# Patient Record
Sex: Male | Born: 1945 | Race: White | Hispanic: No | Marital: Married | Smoking: Former smoker
Health system: Southern US, Community
[De-identification: ages and names within clinical notes are randomized; demographics above are authoritative.]

## PROBLEM LIST (undated history)

## (undated) ENCOUNTER — Inpatient Hospital Stay: Admission: EM | Payer: Self-pay | Source: Other Acute Inpatient Hospital | Admitting: Internal Medicine

## (undated) DIAGNOSIS — G8929 Other chronic pain: Secondary | ICD-10-CM

## (undated) DIAGNOSIS — M1990 Unspecified osteoarthritis, unspecified site: Secondary | ICD-10-CM

## (undated) DIAGNOSIS — I213 ST elevation (STEMI) myocardial infarction of unspecified site: Secondary | ICD-10-CM

## (undated) DIAGNOSIS — I10 Essential (primary) hypertension: Secondary | ICD-10-CM

## (undated) DIAGNOSIS — E119 Type 2 diabetes mellitus without complications: Secondary | ICD-10-CM

## (undated) DIAGNOSIS — Z720 Tobacco use: Secondary | ICD-10-CM

## (undated) DIAGNOSIS — I2510 Atherosclerotic heart disease of native coronary artery without angina pectoris: Secondary | ICD-10-CM

## (undated) DIAGNOSIS — E785 Hyperlipidemia, unspecified: Secondary | ICD-10-CM

---

## 2005-05-30 ENCOUNTER — Inpatient Hospital Stay (HOSPITAL_COMMUNITY): Admission: AD | Admit: 2005-05-30 | Discharge: 2005-06-02 | Payer: Self-pay | Admitting: Cardiology

## 2005-05-30 ENCOUNTER — Ambulatory Visit: Payer: Self-pay | Admitting: Cardiology

## 2005-06-29 ENCOUNTER — Ambulatory Visit: Payer: Self-pay | Admitting: Cardiology

## 2006-08-01 ENCOUNTER — Ambulatory Visit: Payer: Self-pay | Admitting: Cardiology

## 2007-05-09 ENCOUNTER — Ambulatory Visit: Payer: Self-pay | Admitting: Cardiovascular Disease

## 2007-06-06 ENCOUNTER — Ambulatory Visit: Payer: Self-pay | Admitting: Cardiovascular Disease

## 2007-06-06 ENCOUNTER — Encounter (HOSPITAL_COMMUNITY): Admission: RE | Admit: 2007-06-06 | Discharge: 2007-06-19 | Payer: Self-pay | Admitting: Cardiovascular Disease

## 2007-09-26 ENCOUNTER — Ambulatory Visit: Payer: Self-pay | Admitting: Cardiology

## 2009-04-07 DIAGNOSIS — E118 Type 2 diabetes mellitus with unspecified complications: Secondary | ICD-10-CM

## 2009-04-07 DIAGNOSIS — Z794 Long term (current) use of insulin: Secondary | ICD-10-CM
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
## 2011-02-04 NOTE — Cardiovascular Report (Signed)
NAME:  Dennis [name], Dennis [name]               ACCOUNT NO.:  [id number]   MEDICAL RECORD NO.:  [id number]          PATIENT TYPE:  INP   LOCATION:  2930                         FACILITY:  [facility]   PHYSICIAN:  Salvadore Farber, M.D. LHCDATE OF BIRTH:  09-27-1945   DATE OF PROCEDURE:  05/31/2005  DATE OF DISCHARGE:                              CARDIAC CATHETERIZATION   PROCEDURE:  Left heart catheterization, left ventriculography, coronary  angiography, bare metal stent to the mid RCA, bare metal stent to the distal  RCA, bare metal stent to the second obtuse marginal, intravascular  ultrasound of the RCA.   INDICATIONS FOR PROCEDURE:  [name] is a 65 year old gentleman without  prior cardiac history.  He has risk factors of former tobacco abuse and  diabetes mellitus.  Lipid status is unknown.  He presents with non-ST  segment elevation myocardial infarction.  Electrocardiogram was unrevealing.  He was referred for urgent angiography.  He had had chest pain at rest  shortly prior to coming to the catheterization lab and eptifibatide had been  administered.  He did arrive in the lab pain-free.   DESCRIPTION OF PROCEDURE:  Informed consent was obtained.  Under 1%  lidocaine local anesthesia, a 6 French sheath was placed in the right common  femoral artery using the modified Seldinger technique.  Diagnostic  angiography and ventriculography were performed using JL-4, JR-4 and pigtail  catheters.  This demonstrated the culprit lesion to be a thrombotic severe  stenosis of the mid RCA with severe stenoses also of the distal RCA and  second obtuse marginal.  Decision was made to proceed to percutaneous  revascularization.   Additional bolus of eptifibatide was administered.  The patient had been  given 600 mg of Plavix more than six hours prior to the procedure.  He had  received low molecular weight heparin 12 hours prior to the procedure.  Therefore, 50 units/kg of unfractionated heparin was  administered.  ACT was  maintained at greater than 200 seconds throughout the case.   Attention was first turned to the RCA.  A 6 [place] JR-4 guide was advanced  over a wire and engaged in the ostium of the RCA.  A Prowater wire was  advanced to the distal PDA without difficulty. The lesion of the mid RCA was  predilated using a 2.5 x15 mm Maverick at 6 atmospheres.  The lesion was  then stented using a 3.0 x 20 mm Liberte' at 16 atmospheres.  This was then  post dilated using a 3.25 x 18 mm PowerSail at 18 atmospheres.  Repeat  angiography after the administration of intracoronary nitroglycerin  demonstrated persistence of the severe stenosis in the distal RCA.  Decision  was made to treat this percutaneously.  A 2.75 x 24 mm Liberte' was advanced  across the lesion and deployed at 14 atmospheres.  Next, there remained a  hazy lesion consistent with thrombus at the proximal margin of the stent in  the mid RCA.  To assess whether this presented thrombus extruding through  stent struts or also thrombus extending proximal to the stent versus  dissection,  I proceeded to intravascular ultrasound.  Automated pullback was  performed using the Atlantis ultrasound probe.  This confirmed that there  was thrombus extending from just proximal to the stent into the stented  segment.  To stabilize this, I decided to cover it with a second stent.  Therefore, a 3.0 x 8 mm Liberte' was deployed at 16 atmospheres so as to  overlap the proximal portion of the previously placed stent by approximately  2 mm.  The entirety of this stent including the region of overlap with the  previously placed stent was post dilated using a 3.25 x 18 mm PowerSail at  18 atmospheres.  Final angiography demonstrated no residual stenosis in the  treated regions, no dissection and TIMI-3 flow to the distal vasculature.  There remained no thrombus protruding through the stent.   During treatment of the RCA, the patient did  complain of chest pain as  severe as 10/10.  He had transient ST elevations.  These were most marked  with contrast injections but did persist after this.  Despite this, there  was TIMI-3 flow throughout the procedure and careful assessment of the  angiograms demonstrated no distal embolization.   Attention was then turned to the circumflex.  A VODA left 3.5 guide was  advanced over a wire and advanced in the ostium of the left main.  A  Prowater wire was advanced to the distal obtuse marginal without difficulty.  The lesion was directly stented using a 2.5 x 24 mm Express deployed at 14  atmospheres.  Final angiography demonstrated no residual stenosis and TIMI-3  flow to the distal vasculature.  Patient tolerated the procedure well and  was then transferred to the holding room in stable condition.   COMPLICATIONS:  None.   FINDINGS:  1.  LV:  118/14/24.  EF 65% without regional wall motion abnormality.  2.  No aortic stenosis or mitral regurgitation.  3.  Left main:  Angiographically normal.  4.  LAD:  Moderate size vessel giving rise to a single moderate size      diagonal.  The proximal LAD is diffusely diseased with up to 40%      stenosis.  The diagonal had an 80% ostial stenosis.  5.  Circumflex:  Moderate size vessel giving rise to two obtuse marginals.      The first marginal has a 60% stenosis beginning at its ostium.  The      second obtuse marginal had a long 80% stenosis.  This was treated with      bare metal stent with excellent result.  6.  RCA:  Large dominant vessel.  There is 30% stenosis proximally.  There      was then a thrombotic 95% stenosis of the mid vessel and a 70% stenosis      of the distal vessel before the takeoff of the PDA.  Both the mid and      distal lesions were stented using bare metal stents with excellent      result.   IMPRESSION/RECOMMENDATIONS:  Successful percutaneous intervention on both the right coronary artery and circumflex.  Will  plan on continuing his  Integrilin for 18 hours.  Aspirin should be continued indefinitely.  Plavix  should be continued for a year given his acute coronary syndrome.  Statin  will be initiated.      Salvadore Farber, M.D. [facility]  Electronically Signed     WED/MEDQ  D:  05/31/2005  T:  05/31/2005  Job:  [id number]   cc:   Dr. Darcus [place], [place]   Learta Codding, M.D. [facility]  [address]  [address]  [place]  [place] [address]
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
## 2012-08-26 DIAGNOSIS — R0602 Shortness of breath: Secondary | ICD-10-CM

## 2015-01-28 ENCOUNTER — Other Ambulatory Visit (HOSPITAL_COMMUNITY): Payer: Self-pay | Admitting: Internal Medicine

## 2015-01-28 DIAGNOSIS — C799 Secondary malignant neoplasm of unspecified site: Secondary | ICD-10-CM

## 2015-09-23 DIAGNOSIS — I2510 Atherosclerotic heart disease of native coronary artery without angina pectoris: Secondary | ICD-10-CM | POA: Diagnosis not present

## 2015-09-23 DIAGNOSIS — M545 Low back pain: Secondary | ICD-10-CM | POA: Diagnosis not present

## 2015-09-23 DIAGNOSIS — R0789 Other chest pain: Secondary | ICD-10-CM | POA: Diagnosis not present

## 2015-09-23 DIAGNOSIS — R079 Chest pain, unspecified: Secondary | ICD-10-CM | POA: Diagnosis not present

## 2015-09-23 DIAGNOSIS — E1165 Type 2 diabetes mellitus with hyperglycemia: Secondary | ICD-10-CM | POA: Diagnosis not present

## 2015-09-23 DIAGNOSIS — J40 Bronchitis, not specified as acute or chronic: Secondary | ICD-10-CM | POA: Diagnosis not present

## 2015-09-23 DIAGNOSIS — R0781 Pleurodynia: Secondary | ICD-10-CM | POA: Diagnosis not present

## 2015-09-24 DIAGNOSIS — M959 Acquired deformity of musculoskeletal system, unspecified: Secondary | ICD-10-CM | POA: Diagnosis not present

## 2015-09-24 DIAGNOSIS — G894 Chronic pain syndrome: Secondary | ICD-10-CM | POA: Diagnosis not present

## 2015-09-24 DIAGNOSIS — R634 Abnormal weight loss: Secondary | ICD-10-CM | POA: Diagnosis not present

## 2015-09-24 DIAGNOSIS — M4710 Other spondylosis with myelopathy, site unspecified: Secondary | ICD-10-CM | POA: Diagnosis not present

## 2015-10-23 DIAGNOSIS — M47814 Spondylosis without myelopathy or radiculopathy, thoracic region: Secondary | ICD-10-CM | POA: Diagnosis not present

## 2015-10-23 DIAGNOSIS — M47817 Spondylosis without myelopathy or radiculopathy, lumbosacral region: Secondary | ICD-10-CM | POA: Diagnosis not present

## 2015-10-23 DIAGNOSIS — Z79891 Long term (current) use of opiate analgesic: Secondary | ICD-10-CM | POA: Diagnosis not present

## 2015-10-27 DIAGNOSIS — R634 Abnormal weight loss: Secondary | ICD-10-CM | POA: Diagnosis not present

## 2015-10-27 DIAGNOSIS — M4710 Other spondylosis with myelopathy, site unspecified: Secondary | ICD-10-CM | POA: Diagnosis not present

## 2015-10-27 DIAGNOSIS — M5126 Other intervertebral disc displacement, lumbar region: Secondary | ICD-10-CM | POA: Diagnosis not present

## 2015-10-27 DIAGNOSIS — M47814 Spondylosis without myelopathy or radiculopathy, thoracic region: Secondary | ICD-10-CM | POA: Diagnosis not present

## 2015-10-29 DIAGNOSIS — R5382 Chronic fatigue, unspecified: Secondary | ICD-10-CM | POA: Diagnosis not present

## 2015-10-29 DIAGNOSIS — M959 Acquired deformity of musculoskeletal system, unspecified: Secondary | ICD-10-CM | POA: Diagnosis not present

## 2015-10-29 DIAGNOSIS — M4710 Other spondylosis with myelopathy, site unspecified: Secondary | ICD-10-CM | POA: Diagnosis not present

## 2015-10-29 DIAGNOSIS — G894 Chronic pain syndrome: Secondary | ICD-10-CM | POA: Diagnosis not present

## 2015-10-29 DIAGNOSIS — R634 Abnormal weight loss: Secondary | ICD-10-CM | POA: Diagnosis not present

## 2015-11-18 DIAGNOSIS — E119 Type 2 diabetes mellitus without complications: Secondary | ICD-10-CM | POA: Diagnosis not present

## 2015-11-19 DIAGNOSIS — E119 Type 2 diabetes mellitus without complications: Secondary | ICD-10-CM | POA: Diagnosis not present

## 2015-11-19 DIAGNOSIS — I2510 Atherosclerotic heart disease of native coronary artery without angina pectoris: Secondary | ICD-10-CM | POA: Diagnosis not present

## 2015-11-19 DIAGNOSIS — I10 Essential (primary) hypertension: Secondary | ICD-10-CM | POA: Diagnosis not present

## 2015-11-20 DIAGNOSIS — Z713 Dietary counseling and surveillance: Secondary | ICD-10-CM | POA: Diagnosis not present

## 2015-11-20 DIAGNOSIS — M545 Low back pain: Secondary | ICD-10-CM | POA: Diagnosis not present

## 2015-11-20 DIAGNOSIS — I10 Essential (primary) hypertension: Secondary | ICD-10-CM | POA: Diagnosis not present

## 2015-11-20 DIAGNOSIS — Z6824 Body mass index (BMI) 24.0-24.9, adult: Secondary | ICD-10-CM | POA: Diagnosis not present

## 2015-11-25 DIAGNOSIS — M47812 Spondylosis without myelopathy or radiculopathy, cervical region: Secondary | ICD-10-CM | POA: Diagnosis not present

## 2015-11-25 DIAGNOSIS — M47814 Spondylosis without myelopathy or radiculopathy, thoracic region: Secondary | ICD-10-CM | POA: Diagnosis not present

## 2015-11-25 DIAGNOSIS — M25561 Pain in right knee: Secondary | ICD-10-CM | POA: Diagnosis not present

## 2015-11-25 DIAGNOSIS — M47817 Spondylosis without myelopathy or radiculopathy, lumbosacral region: Secondary | ICD-10-CM | POA: Diagnosis not present

## 2015-11-27 DIAGNOSIS — E1165 Type 2 diabetes mellitus with hyperglycemia: Secondary | ICD-10-CM | POA: Diagnosis not present

## 2015-11-27 DIAGNOSIS — I10 Essential (primary) hypertension: Secondary | ICD-10-CM | POA: Diagnosis not present

## 2015-11-27 DIAGNOSIS — M545 Low back pain: Secondary | ICD-10-CM | POA: Diagnosis not present

## 2015-12-14 DIAGNOSIS — Z883 Allergy status to other anti-infective agents status: Secondary | ICD-10-CM | POA: Diagnosis not present

## 2015-12-14 DIAGNOSIS — M25561 Pain in right knee: Secondary | ICD-10-CM | POA: Diagnosis not present

## 2015-12-14 DIAGNOSIS — F17210 Nicotine dependence, cigarettes, uncomplicated: Secondary | ICD-10-CM | POA: Diagnosis not present

## 2015-12-14 DIAGNOSIS — M47817 Spondylosis without myelopathy or radiculopathy, lumbosacral region: Secondary | ICD-10-CM | POA: Diagnosis not present

## 2015-12-14 DIAGNOSIS — Z79899 Other long term (current) drug therapy: Secondary | ICD-10-CM | POA: Diagnosis not present

## 2015-12-14 DIAGNOSIS — Z8489 Family history of other specified conditions: Secondary | ICD-10-CM | POA: Diagnosis not present

## 2015-12-14 DIAGNOSIS — M47812 Spondylosis without myelopathy or radiculopathy, cervical region: Secondary | ICD-10-CM | POA: Diagnosis not present

## 2015-12-14 DIAGNOSIS — Z955 Presence of coronary angioplasty implant and graft: Secondary | ICD-10-CM | POA: Diagnosis not present

## 2015-12-14 DIAGNOSIS — M47814 Spondylosis without myelopathy or radiculopathy, thoracic region: Secondary | ICD-10-CM | POA: Diagnosis not present

## 2015-12-14 DIAGNOSIS — M545 Low back pain: Secondary | ICD-10-CM | POA: Diagnosis not present

## 2015-12-14 DIAGNOSIS — E119 Type 2 diabetes mellitus without complications: Secondary | ICD-10-CM | POA: Diagnosis not present

## 2015-12-14 DIAGNOSIS — K5903 Drug induced constipation: Secondary | ICD-10-CM | POA: Diagnosis not present

## 2015-12-14 DIAGNOSIS — Z7982 Long term (current) use of aspirin: Secondary | ICD-10-CM | POA: Diagnosis not present

## 2015-12-14 DIAGNOSIS — Z888 Allergy status to other drugs, medicaments and biological substances status: Secondary | ICD-10-CM | POA: Diagnosis not present

## 2015-12-14 DIAGNOSIS — Z7984 Long term (current) use of oral hypoglycemic drugs: Secondary | ICD-10-CM | POA: Diagnosis not present

## 2015-12-14 DIAGNOSIS — M47816 Spondylosis without myelopathy or radiculopathy, lumbar region: Secondary | ICD-10-CM | POA: Diagnosis not present

## 2015-12-14 DIAGNOSIS — Z91013 Allergy to seafood: Secondary | ICD-10-CM | POA: Diagnosis not present

## 2015-12-14 DIAGNOSIS — G4700 Insomnia, unspecified: Secondary | ICD-10-CM | POA: Diagnosis not present

## 2015-12-14 DIAGNOSIS — Z794 Long term (current) use of insulin: Secondary | ICD-10-CM | POA: Diagnosis not present

## 2016-01-08 DIAGNOSIS — E119 Type 2 diabetes mellitus without complications: Secondary | ICD-10-CM | POA: Diagnosis not present

## 2016-01-08 DIAGNOSIS — I10 Essential (primary) hypertension: Secondary | ICD-10-CM | POA: Diagnosis not present

## 2016-01-08 DIAGNOSIS — I2510 Atherosclerotic heart disease of native coronary artery without angina pectoris: Secondary | ICD-10-CM | POA: Diagnosis not present

## 2016-01-13 DIAGNOSIS — I252 Old myocardial infarction: Secondary | ICD-10-CM | POA: Diagnosis not present

## 2016-01-13 DIAGNOSIS — Z955 Presence of coronary angioplasty implant and graft: Secondary | ICD-10-CM | POA: Diagnosis not present

## 2016-01-13 DIAGNOSIS — Z8249 Family history of ischemic heart disease and other diseases of the circulatory system: Secondary | ICD-10-CM | POA: Diagnosis not present

## 2016-01-13 DIAGNOSIS — Z7984 Long term (current) use of oral hypoglycemic drugs: Secondary | ICD-10-CM | POA: Diagnosis not present

## 2016-01-13 DIAGNOSIS — Z79899 Other long term (current) drug therapy: Secondary | ICD-10-CM | POA: Diagnosis not present

## 2016-01-13 DIAGNOSIS — Z794 Long term (current) use of insulin: Secondary | ICD-10-CM | POA: Diagnosis not present

## 2016-01-13 DIAGNOSIS — J181 Lobar pneumonia, unspecified organism: Secondary | ICD-10-CM | POA: Diagnosis not present

## 2016-01-13 DIAGNOSIS — E119 Type 2 diabetes mellitus without complications: Secondary | ICD-10-CM | POA: Diagnosis not present

## 2016-01-13 DIAGNOSIS — Z7982 Long term (current) use of aspirin: Secondary | ICD-10-CM | POA: Diagnosis not present

## 2016-01-13 DIAGNOSIS — R079 Chest pain, unspecified: Secondary | ICD-10-CM | POA: Diagnosis not present

## 2016-01-13 DIAGNOSIS — I2510 Atherosclerotic heart disease of native coronary artery without angina pectoris: Secondary | ICD-10-CM | POA: Diagnosis not present

## 2016-01-13 DIAGNOSIS — F17200 Nicotine dependence, unspecified, uncomplicated: Secondary | ICD-10-CM | POA: Diagnosis not present

## 2016-01-13 DIAGNOSIS — J189 Pneumonia, unspecified organism: Secondary | ICD-10-CM | POA: Diagnosis not present

## 2016-01-13 DIAGNOSIS — R0781 Pleurodynia: Secondary | ICD-10-CM | POA: Diagnosis not present

## 2016-01-25 DIAGNOSIS — M545 Low back pain: Secondary | ICD-10-CM | POA: Diagnosis not present

## 2016-01-25 DIAGNOSIS — E1165 Type 2 diabetes mellitus with hyperglycemia: Secondary | ICD-10-CM | POA: Diagnosis not present

## 2016-01-25 DIAGNOSIS — Z299 Encounter for prophylactic measures, unspecified: Secondary | ICD-10-CM | POA: Diagnosis not present

## 2016-01-25 DIAGNOSIS — J189 Pneumonia, unspecified organism: Secondary | ICD-10-CM | POA: Diagnosis not present

## 2016-01-27 DIAGNOSIS — E119 Type 2 diabetes mellitus without complications: Secondary | ICD-10-CM | POA: Diagnosis not present

## 2016-01-27 DIAGNOSIS — I10 Essential (primary) hypertension: Secondary | ICD-10-CM | POA: Diagnosis not present

## 2016-01-27 DIAGNOSIS — I2510 Atherosclerotic heart disease of native coronary artery without angina pectoris: Secondary | ICD-10-CM | POA: Diagnosis not present

## 2016-02-03 DIAGNOSIS — J189 Pneumonia, unspecified organism: Secondary | ICD-10-CM | POA: Diagnosis not present

## 2016-02-06 DIAGNOSIS — R61 Generalized hyperhidrosis: Secondary | ICD-10-CM | POA: Diagnosis not present

## 2016-02-06 DIAGNOSIS — Z7984 Long term (current) use of oral hypoglycemic drugs: Secondary | ICD-10-CM | POA: Diagnosis not present

## 2016-02-06 DIAGNOSIS — I252 Old myocardial infarction: Secondary | ICD-10-CM | POA: Diagnosis not present

## 2016-02-06 DIAGNOSIS — R0789 Other chest pain: Secondary | ICD-10-CM | POA: Diagnosis not present

## 2016-02-06 DIAGNOSIS — Z7982 Long term (current) use of aspirin: Secondary | ICD-10-CM | POA: Diagnosis not present

## 2016-02-06 DIAGNOSIS — E119 Type 2 diabetes mellitus without complications: Secondary | ICD-10-CM | POA: Diagnosis not present

## 2016-02-06 DIAGNOSIS — Z794 Long term (current) use of insulin: Secondary | ICD-10-CM | POA: Diagnosis not present

## 2016-02-06 DIAGNOSIS — Z79899 Other long term (current) drug therapy: Secondary | ICD-10-CM | POA: Diagnosis not present

## 2016-02-06 DIAGNOSIS — R079 Chest pain, unspecified: Secondary | ICD-10-CM | POA: Diagnosis not present

## 2016-02-06 DIAGNOSIS — Z8701 Personal history of pneumonia (recurrent): Secondary | ICD-10-CM | POA: Diagnosis not present

## 2016-02-06 DIAGNOSIS — Z8249 Family history of ischemic heart disease and other diseases of the circulatory system: Secondary | ICD-10-CM | POA: Diagnosis not present

## 2016-02-06 DIAGNOSIS — M542 Cervicalgia: Secondary | ICD-10-CM | POA: Diagnosis not present

## 2016-02-26 DIAGNOSIS — Z299 Encounter for prophylactic measures, unspecified: Secondary | ICD-10-CM | POA: Diagnosis not present

## 2016-02-26 DIAGNOSIS — F17210 Nicotine dependence, cigarettes, uncomplicated: Secondary | ICD-10-CM | POA: Diagnosis not present

## 2016-02-26 DIAGNOSIS — G4700 Insomnia, unspecified: Secondary | ICD-10-CM | POA: Diagnosis not present

## 2016-02-26 DIAGNOSIS — J069 Acute upper respiratory infection, unspecified: Secondary | ICD-10-CM | POA: Diagnosis not present

## 2016-02-26 DIAGNOSIS — F17200 Nicotine dependence, unspecified, uncomplicated: Secondary | ICD-10-CM | POA: Diagnosis not present

## 2016-03-03 DIAGNOSIS — Z6823 Body mass index (BMI) 23.0-23.9, adult: Secondary | ICD-10-CM | POA: Diagnosis not present

## 2016-03-03 DIAGNOSIS — I10 Essential (primary) hypertension: Secondary | ICD-10-CM | POA: Diagnosis not present

## 2016-03-03 DIAGNOSIS — E1165 Type 2 diabetes mellitus with hyperglycemia: Secondary | ICD-10-CM | POA: Diagnosis not present

## 2016-03-03 DIAGNOSIS — M545 Low back pain: Secondary | ICD-10-CM | POA: Diagnosis not present

## 2016-03-03 DIAGNOSIS — G4700 Insomnia, unspecified: Secondary | ICD-10-CM | POA: Diagnosis not present

## 2016-03-07 DIAGNOSIS — I10 Essential (primary) hypertension: Secondary | ICD-10-CM | POA: Diagnosis not present

## 2016-03-07 DIAGNOSIS — I2510 Atherosclerotic heart disease of native coronary artery without angina pectoris: Secondary | ICD-10-CM | POA: Diagnosis not present

## 2016-03-07 DIAGNOSIS — E119 Type 2 diabetes mellitus without complications: Secondary | ICD-10-CM | POA: Diagnosis not present

## 2016-04-14 DIAGNOSIS — I2510 Atherosclerotic heart disease of native coronary artery without angina pectoris: Secondary | ICD-10-CM | POA: Diagnosis not present

## 2016-04-14 DIAGNOSIS — E119 Type 2 diabetes mellitus without complications: Secondary | ICD-10-CM | POA: Diagnosis not present

## 2016-04-14 DIAGNOSIS — I10 Essential (primary) hypertension: Secondary | ICD-10-CM | POA: Diagnosis not present

## 2016-05-09 DIAGNOSIS — Z7189 Other specified counseling: Secondary | ICD-10-CM | POA: Diagnosis not present

## 2016-05-09 DIAGNOSIS — Z0000 Encounter for general adult medical examination without abnormal findings: Secondary | ICD-10-CM | POA: Diagnosis not present

## 2016-05-09 DIAGNOSIS — Z1211 Encounter for screening for malignant neoplasm of colon: Secondary | ICD-10-CM | POA: Diagnosis not present

## 2016-05-09 DIAGNOSIS — Z1389 Encounter for screening for other disorder: Secondary | ICD-10-CM | POA: Diagnosis not present

## 2016-05-09 DIAGNOSIS — Z299 Encounter for prophylactic measures, unspecified: Secondary | ICD-10-CM | POA: Diagnosis not present

## 2016-05-16 DIAGNOSIS — I10 Essential (primary) hypertension: Secondary | ICD-10-CM | POA: Diagnosis not present

## 2016-05-16 DIAGNOSIS — E119 Type 2 diabetes mellitus without complications: Secondary | ICD-10-CM | POA: Diagnosis not present

## 2016-05-16 DIAGNOSIS — I2510 Atherosclerotic heart disease of native coronary artery without angina pectoris: Secondary | ICD-10-CM | POA: Diagnosis not present

## 2016-06-14 DIAGNOSIS — M25511 Pain in right shoulder: Secondary | ICD-10-CM | POA: Diagnosis not present

## 2016-06-14 DIAGNOSIS — Z299 Encounter for prophylactic measures, unspecified: Secondary | ICD-10-CM | POA: Diagnosis not present

## 2016-06-14 DIAGNOSIS — I2510 Atherosclerotic heart disease of native coronary artery without angina pectoris: Secondary | ICD-10-CM | POA: Diagnosis not present

## 2016-06-14 DIAGNOSIS — E1165 Type 2 diabetes mellitus with hyperglycemia: Secondary | ICD-10-CM | POA: Diagnosis not present

## 2016-06-28 DIAGNOSIS — I10 Essential (primary) hypertension: Secondary | ICD-10-CM | POA: Diagnosis not present

## 2016-06-28 DIAGNOSIS — E119 Type 2 diabetes mellitus without complications: Secondary | ICD-10-CM | POA: Diagnosis not present

## 2016-06-28 DIAGNOSIS — I2510 Atherosclerotic heart disease of native coronary artery without angina pectoris: Secondary | ICD-10-CM | POA: Diagnosis not present

## 2016-07-04 DIAGNOSIS — M542 Cervicalgia: Secondary | ICD-10-CM | POA: Diagnosis not present

## 2016-07-14 ENCOUNTER — Other Ambulatory Visit: Payer: Self-pay | Admitting: Pain Medicine

## 2016-07-15 DIAGNOSIS — M47817 Spondylosis without myelopathy or radiculopathy, lumbosacral region: Secondary | ICD-10-CM | POA: Diagnosis not present

## 2016-07-15 DIAGNOSIS — M47816 Spondylosis without myelopathy or radiculopathy, lumbar region: Secondary | ICD-10-CM | POA: Diagnosis not present

## 2016-07-15 DIAGNOSIS — M5136 Other intervertebral disc degeneration, lumbar region: Secondary | ICD-10-CM | POA: Diagnosis not present

## 2016-07-25 DIAGNOSIS — M5416 Radiculopathy, lumbar region: Secondary | ICD-10-CM | POA: Diagnosis not present

## 2016-07-25 DIAGNOSIS — M533 Sacrococcygeal disorders, not elsewhere classified: Secondary | ICD-10-CM | POA: Diagnosis not present

## 2016-07-25 DIAGNOSIS — M791 Myalgia: Secondary | ICD-10-CM | POA: Diagnosis not present

## 2016-07-25 DIAGNOSIS — M47817 Spondylosis without myelopathy or radiculopathy, lumbosacral region: Secondary | ICD-10-CM | POA: Diagnosis not present

## 2016-07-29 DIAGNOSIS — I10 Essential (primary) hypertension: Secondary | ICD-10-CM | POA: Diagnosis not present

## 2016-07-29 DIAGNOSIS — E119 Type 2 diabetes mellitus without complications: Secondary | ICD-10-CM | POA: Diagnosis not present

## 2016-07-29 DIAGNOSIS — I2510 Atherosclerotic heart disease of native coronary artery without angina pectoris: Secondary | ICD-10-CM | POA: Diagnosis not present

## 2016-08-23 DIAGNOSIS — M47817 Spondylosis without myelopathy or radiculopathy, lumbosacral region: Secondary | ICD-10-CM | POA: Diagnosis not present

## 2016-08-23 DIAGNOSIS — M533 Sacrococcygeal disorders, not elsewhere classified: Secondary | ICD-10-CM | POA: Diagnosis not present

## 2016-08-23 DIAGNOSIS — M791 Myalgia: Secondary | ICD-10-CM | POA: Diagnosis not present

## 2016-08-23 DIAGNOSIS — M5416 Radiculopathy, lumbar region: Secondary | ICD-10-CM | POA: Diagnosis not present

## 2016-09-14 DIAGNOSIS — E119 Type 2 diabetes mellitus without complications: Secondary | ICD-10-CM | POA: Diagnosis not present

## 2016-09-14 DIAGNOSIS — I2510 Atherosclerotic heart disease of native coronary artery without angina pectoris: Secondary | ICD-10-CM | POA: Diagnosis not present

## 2016-09-14 DIAGNOSIS — I10 Essential (primary) hypertension: Secondary | ICD-10-CM | POA: Diagnosis not present

## 2016-09-21 DIAGNOSIS — E1165 Type 2 diabetes mellitus with hyperglycemia: Secondary | ICD-10-CM | POA: Diagnosis not present

## 2016-09-21 DIAGNOSIS — G4700 Insomnia, unspecified: Secondary | ICD-10-CM | POA: Diagnosis not present

## 2016-09-21 DIAGNOSIS — Z299 Encounter for prophylactic measures, unspecified: Secondary | ICD-10-CM | POA: Diagnosis not present

## 2016-09-21 DIAGNOSIS — I10 Essential (primary) hypertension: Secondary | ICD-10-CM | POA: Diagnosis not present

## 2016-09-21 DIAGNOSIS — F17210 Nicotine dependence, cigarettes, uncomplicated: Secondary | ICD-10-CM | POA: Diagnosis not present

## 2016-09-21 DIAGNOSIS — F17200 Nicotine dependence, unspecified, uncomplicated: Secondary | ICD-10-CM | POA: Diagnosis not present

## 2016-09-27 DIAGNOSIS — M791 Myalgia: Secondary | ICD-10-CM | POA: Diagnosis not present

## 2016-09-27 DIAGNOSIS — M5416 Radiculopathy, lumbar region: Secondary | ICD-10-CM | POA: Diagnosis not present

## 2016-09-27 DIAGNOSIS — M533 Sacrococcygeal disorders, not elsewhere classified: Secondary | ICD-10-CM | POA: Diagnosis not present

## 2016-09-27 DIAGNOSIS — M47817 Spondylosis without myelopathy or radiculopathy, lumbosacral region: Secondary | ICD-10-CM | POA: Diagnosis not present

## 2016-10-12 DIAGNOSIS — E119 Type 2 diabetes mellitus without complications: Secondary | ICD-10-CM | POA: Diagnosis not present

## 2016-10-12 DIAGNOSIS — I2510 Atherosclerotic heart disease of native coronary artery without angina pectoris: Secondary | ICD-10-CM | POA: Diagnosis not present

## 2016-10-12 DIAGNOSIS — I10 Essential (primary) hypertension: Secondary | ICD-10-CM | POA: Diagnosis not present

## 2016-10-24 DIAGNOSIS — M5416 Radiculopathy, lumbar region: Secondary | ICD-10-CM | POA: Diagnosis not present

## 2016-10-24 DIAGNOSIS — I10 Essential (primary) hypertension: Secondary | ICD-10-CM | POA: Diagnosis not present

## 2016-10-24 DIAGNOSIS — Z713 Dietary counseling and surveillance: Secondary | ICD-10-CM | POA: Diagnosis not present

## 2016-10-24 DIAGNOSIS — Z299 Encounter for prophylactic measures, unspecified: Secondary | ICD-10-CM | POA: Diagnosis not present

## 2016-10-24 DIAGNOSIS — M47817 Spondylosis without myelopathy or radiculopathy, lumbosacral region: Secondary | ICD-10-CM | POA: Diagnosis not present

## 2016-10-24 DIAGNOSIS — Z6825 Body mass index (BMI) 25.0-25.9, adult: Secondary | ICD-10-CM | POA: Diagnosis not present

## 2016-10-24 DIAGNOSIS — M533 Sacrococcygeal disorders, not elsewhere classified: Secondary | ICD-10-CM | POA: Diagnosis not present

## 2016-10-24 DIAGNOSIS — M791 Myalgia: Secondary | ICD-10-CM | POA: Diagnosis not present

## 2016-10-24 DIAGNOSIS — F17210 Nicotine dependence, cigarettes, uncomplicated: Secondary | ICD-10-CM | POA: Diagnosis not present

## 2016-11-02 DIAGNOSIS — I2510 Atherosclerotic heart disease of native coronary artery without angina pectoris: Secondary | ICD-10-CM | POA: Diagnosis not present

## 2016-11-02 DIAGNOSIS — E119 Type 2 diabetes mellitus without complications: Secondary | ICD-10-CM | POA: Diagnosis not present

## 2016-11-02 DIAGNOSIS — I10 Essential (primary) hypertension: Secondary | ICD-10-CM | POA: Diagnosis not present

## 2016-11-21 DIAGNOSIS — M5416 Radiculopathy, lumbar region: Secondary | ICD-10-CM | POA: Diagnosis not present

## 2016-11-21 DIAGNOSIS — M791 Myalgia: Secondary | ICD-10-CM | POA: Diagnosis not present

## 2016-11-21 DIAGNOSIS — M533 Sacrococcygeal disorders, not elsewhere classified: Secondary | ICD-10-CM | POA: Diagnosis not present

## 2016-11-21 DIAGNOSIS — M47817 Spondylosis without myelopathy or radiculopathy, lumbosacral region: Secondary | ICD-10-CM | POA: Diagnosis not present

## 2016-12-12 DIAGNOSIS — R0789 Other chest pain: Secondary | ICD-10-CM | POA: Diagnosis not present

## 2016-12-12 DIAGNOSIS — E7800 Pure hypercholesterolemia, unspecified: Secondary | ICD-10-CM | POA: Diagnosis not present

## 2016-12-12 DIAGNOSIS — I10 Essential (primary) hypertension: Secondary | ICD-10-CM | POA: Diagnosis not present

## 2016-12-12 DIAGNOSIS — I2510 Atherosclerotic heart disease of native coronary artery without angina pectoris: Secondary | ICD-10-CM | POA: Diagnosis not present

## 2016-12-12 DIAGNOSIS — Z713 Dietary counseling and surveillance: Secondary | ICD-10-CM | POA: Diagnosis not present

## 2016-12-12 DIAGNOSIS — F17210 Nicotine dependence, cigarettes, uncomplicated: Secondary | ICD-10-CM | POA: Diagnosis not present

## 2016-12-12 DIAGNOSIS — M549 Dorsalgia, unspecified: Secondary | ICD-10-CM | POA: Diagnosis not present

## 2016-12-12 DIAGNOSIS — E1165 Type 2 diabetes mellitus with hyperglycemia: Secondary | ICD-10-CM | POA: Diagnosis not present

## 2016-12-12 DIAGNOSIS — E039 Hypothyroidism, unspecified: Secondary | ICD-10-CM | POA: Diagnosis not present

## 2016-12-12 DIAGNOSIS — Z299 Encounter for prophylactic measures, unspecified: Secondary | ICD-10-CM | POA: Diagnosis not present

## 2016-12-12 DIAGNOSIS — N400 Enlarged prostate without lower urinary tract symptoms: Secondary | ICD-10-CM | POA: Diagnosis not present

## 2016-12-14 DIAGNOSIS — R079 Chest pain, unspecified: Secondary | ICD-10-CM | POA: Diagnosis not present

## 2016-12-20 DIAGNOSIS — M5136 Other intervertebral disc degeneration, lumbar region: Secondary | ICD-10-CM | POA: Diagnosis not present

## 2016-12-20 DIAGNOSIS — M47816 Spondylosis without myelopathy or radiculopathy, lumbar region: Secondary | ICD-10-CM | POA: Diagnosis not present

## 2016-12-20 DIAGNOSIS — M48061 Spinal stenosis, lumbar region without neurogenic claudication: Secondary | ICD-10-CM | POA: Diagnosis not present

## 2016-12-20 DIAGNOSIS — M791 Myalgia: Secondary | ICD-10-CM | POA: Diagnosis not present

## 2016-12-20 DIAGNOSIS — M546 Pain in thoracic spine: Secondary | ICD-10-CM | POA: Diagnosis not present

## 2016-12-20 DIAGNOSIS — M5416 Radiculopathy, lumbar region: Secondary | ICD-10-CM | POA: Diagnosis not present

## 2016-12-20 DIAGNOSIS — Z79891 Long term (current) use of opiate analgesic: Secondary | ICD-10-CM | POA: Diagnosis not present

## 2016-12-20 DIAGNOSIS — M5137 Other intervertebral disc degeneration, lumbosacral region: Secondary | ICD-10-CM | POA: Diagnosis not present

## 2016-12-20 DIAGNOSIS — M47817 Spondylosis without myelopathy or radiculopathy, lumbosacral region: Secondary | ICD-10-CM | POA: Diagnosis not present

## 2016-12-20 DIAGNOSIS — M545 Low back pain: Secondary | ICD-10-CM | POA: Diagnosis not present

## 2016-12-20 DIAGNOSIS — G894 Chronic pain syndrome: Secondary | ICD-10-CM | POA: Diagnosis not present

## 2016-12-20 DIAGNOSIS — M533 Sacrococcygeal disorders, not elsewhere classified: Secondary | ICD-10-CM | POA: Diagnosis not present

## 2017-01-16 DIAGNOSIS — Z79891 Long term (current) use of opiate analgesic: Secondary | ICD-10-CM | POA: Diagnosis not present

## 2017-01-16 DIAGNOSIS — M47817 Spondylosis without myelopathy or radiculopathy, lumbosacral region: Secondary | ICD-10-CM | POA: Diagnosis not present

## 2017-01-16 DIAGNOSIS — M533 Sacrococcygeal disorders, not elsewhere classified: Secondary | ICD-10-CM | POA: Diagnosis not present

## 2017-01-16 DIAGNOSIS — M5033 Other cervical disc degeneration, cervicothoracic region: Secondary | ICD-10-CM | POA: Diagnosis not present

## 2017-01-16 DIAGNOSIS — M545 Low back pain: Secondary | ICD-10-CM | POA: Diagnosis not present

## 2017-01-16 DIAGNOSIS — M5416 Radiculopathy, lumbar region: Secondary | ICD-10-CM | POA: Diagnosis not present

## 2017-01-16 DIAGNOSIS — M5137 Other intervertebral disc degeneration, lumbosacral region: Secondary | ICD-10-CM | POA: Diagnosis not present

## 2017-01-16 DIAGNOSIS — M5136 Other intervertebral disc degeneration, lumbar region: Secondary | ICD-10-CM | POA: Diagnosis not present

## 2017-01-16 DIAGNOSIS — M546 Pain in thoracic spine: Secondary | ICD-10-CM | POA: Diagnosis not present

## 2017-01-16 DIAGNOSIS — M791 Myalgia: Secondary | ICD-10-CM | POA: Diagnosis not present

## 2017-01-16 DIAGNOSIS — M5031 Other cervical disc degeneration,  high cervical region: Secondary | ICD-10-CM | POA: Diagnosis not present

## 2017-01-16 DIAGNOSIS — G894 Chronic pain syndrome: Secondary | ICD-10-CM | POA: Diagnosis not present

## 2017-11-13 ENCOUNTER — Inpatient Hospital Stay (HOSPITAL_COMMUNITY)
Admission: EM | Admit: 2017-11-13 | Discharge: 2017-11-15 | DRG: 247 | Disposition: A | Discharge status: Home / Self Care | Payer: Medicare HMO | Source: Ambulatory Visit | Attending: Cardiology | Admitting: Cardiology

## 2017-11-13 ENCOUNTER — Inpatient Hospital Stay (HOSPITAL_COMMUNITY)
Admission: EM | Disposition: A | Discharge status: Home / Self Care | Payer: Self-pay | Source: Ambulatory Visit | Attending: Cardiology

## 2017-11-13 ENCOUNTER — Other Ambulatory Visit (HOSPITAL_COMMUNITY): Payer: Medicare Other

## 2017-11-13 ENCOUNTER — Other Ambulatory Visit: Payer: Self-pay

## 2017-11-13 ENCOUNTER — Encounter (HOSPITAL_COMMUNITY): Payer: Self-pay | Admitting: Cardiology

## 2017-11-13 DIAGNOSIS — Z8249 Family history of ischemic heart disease and other diseases of the circulatory system: Secondary | ICD-10-CM | POA: Diagnosis not present

## 2017-11-13 DIAGNOSIS — E785 Hyperlipidemia, unspecified: Secondary | ICD-10-CM | POA: Diagnosis present

## 2017-11-13 DIAGNOSIS — I2510 Atherosclerotic heart disease of native coronary artery without angina pectoris: Secondary | ICD-10-CM | POA: Diagnosis present

## 2017-11-13 DIAGNOSIS — F17210 Nicotine dependence, cigarettes, uncomplicated: Secondary | ICD-10-CM | POA: Diagnosis present

## 2017-11-13 DIAGNOSIS — E1165 Type 2 diabetes mellitus with hyperglycemia: Secondary | ICD-10-CM | POA: Diagnosis present

## 2017-11-13 DIAGNOSIS — Z720 Tobacco use: Secondary | ICD-10-CM | POA: Diagnosis not present

## 2017-11-13 DIAGNOSIS — I252 Old myocardial infarction: Secondary | ICD-10-CM | POA: Diagnosis present

## 2017-11-13 DIAGNOSIS — G8929 Other chronic pain: Secondary | ICD-10-CM | POA: Diagnosis present

## 2017-11-13 DIAGNOSIS — I2111 ST elevation (STEMI) myocardial infarction involving right coronary artery: Secondary | ICD-10-CM

## 2017-11-13 DIAGNOSIS — IMO0001 Reserved for inherently not codable concepts without codable children: Secondary | ICD-10-CM

## 2017-11-13 DIAGNOSIS — E118 Type 2 diabetes mellitus with unspecified complications: Secondary | ICD-10-CM

## 2017-11-13 DIAGNOSIS — M549 Dorsalgia, unspecified: Secondary | ICD-10-CM | POA: Diagnosis present

## 2017-11-13 DIAGNOSIS — I2119 ST elevation (STEMI) myocardial infarction involving other coronary artery of inferior wall: Principal | ICD-10-CM | POA: Diagnosis present

## 2017-11-13 DIAGNOSIS — Z794 Long term (current) use of insulin: Secondary | ICD-10-CM

## 2017-11-13 DIAGNOSIS — Z955 Presence of coronary angioplasty implant and graft: Secondary | ICD-10-CM

## 2017-11-13 DIAGNOSIS — Z9119 Patient's noncompliance with other medical treatment and regimen: Secondary | ICD-10-CM | POA: Diagnosis not present

## 2017-11-13 DIAGNOSIS — I10 Essential (primary) hypertension: Secondary | ICD-10-CM | POA: Diagnosis present

## 2017-11-13 DIAGNOSIS — E782 Mixed hyperlipidemia: Secondary | ICD-10-CM | POA: Diagnosis not present

## 2017-11-13 DIAGNOSIS — I219 Acute myocardial infarction, unspecified: Secondary | ICD-10-CM | POA: Diagnosis not present

## 2017-11-13 DIAGNOSIS — E1159 Type 2 diabetes mellitus with other circulatory complications: Secondary | ICD-10-CM | POA: Diagnosis not present

## 2017-11-13 HISTORY — PX: CORONARY STENT INTERVENTION: CATH118234

## 2017-11-13 HISTORY — DX: Hyperlipidemia, unspecified: E78.5

## 2017-11-13 HISTORY — DX: Essential (primary) hypertension: I10

## 2017-11-13 HISTORY — PX: CORONARY/GRAFT ACUTE MI REVASCULARIZATION: CATH118305

## 2017-11-13 HISTORY — DX: Atherosclerotic heart disease of native coronary artery without angina pectoris: I25.10

## 2017-11-13 HISTORY — DX: Type 2 diabetes mellitus without complications: E11.9

## 2017-11-13 HISTORY — DX: ST elevation (STEMI) myocardial infarction of unspecified site: I21.3

## 2017-11-13 HISTORY — PX: LEFT HEART CATH AND CORONARY ANGIOGRAPHY: CATH118249

## 2017-11-13 HISTORY — DX: Tobacco use: Z72.0

## 2017-11-13 HISTORY — DX: Other chronic pain: G89.29

## 2017-11-13 LAB — CBC
HCT: 40.3 % (ref 39.0–52.0)
Hemoglobin: 13.5 g/dL (ref 13.0–17.0)
MCH: 29.4 pg (ref 26.0–34.0)
MCHC: 33.5 g/dL (ref 30.0–36.0)
MCV: 87.8 fL (ref 78.0–100.0)
Platelets: 263 10*3/uL (ref 150–400)
RBC: 4.59 MIL/uL (ref 4.22–5.81)
RDW: 13.7 % (ref 11.5–15.5)
WBC: 8.9 10*3/uL (ref 4.0–10.5)

## 2017-11-13 LAB — CREATININE, SERUM
CREATININE: 1.31 mg/dL — AB (ref 0.61–1.24)
GFR calc Af Amer: >60 mL/min (ref >60)
GFR, EST NON AFRICAN AMERICAN: 54 mL/min — AB (ref >60)

## 2017-11-13 LAB — TROPONIN I
Troponin I: >65 ng/mL (ref <0.03)
Troponin I: >65 ng/mL (ref <0.03)

## 2017-11-13 LAB — GLUCOSE, CAPILLARY
GLUCOSE-CAPILLARY: 368 mg/dL — AB (ref 65–99)
Glucose-Capillary: 345 mg/dL — ABNORMAL HIGH (ref 65–99)
Glucose-Capillary: 267 mg/dL — ABNORMAL HIGH (ref 65–99)
Glucose-Capillary: 265 mg/dL — ABNORMAL HIGH (ref 65–99)

## 2017-11-13 LAB — MRSA PCR SCREENING: MRSA BY PCR: NEGATIVE

## 2017-11-13 LAB — POCT ACTIVATED CLOTTING TIME: Activated Clotting Time: 450 seconds

## 2017-11-13 SURGERY — CORONARY/GRAFT ACUTE MI REVASCULARIZATION
Anesthesia: LOCAL

## 2017-11-13 MED ORDER — ACETAMINOPHEN 325 MG PO TABS: 650.0000 mg | ORAL_TABLET | ORAL | Status: DC | PRN

## 2017-11-13 MED ORDER — IOPAMIDOL (ISOVUE-370) INJECTION 76%
INTRAVENOUS | Status: DC | PRN
  Administered 2017-11-13: 180 mL via INTRA_ARTERIAL

## 2017-11-13 MED ORDER — LIDOCAINE HCL (PF) 1 % IJ SOLN
INTRAMUSCULAR | Status: DC | PRN
  Administered 2017-11-13: 2 mL

## 2017-11-13 MED ORDER — ASPIRIN EC 81 MG PO TBEC
81.0000 mg | DELAYED_RELEASE_TABLET | Freq: Every day | ORAL | Status: DC
  Administered 2017-11-13 – 2017-11-15 (×3): 81 mg via ORAL
  Filled 2017-11-13 (×3): qty 1

## 2017-11-13 MED ORDER — NITROGLYCERIN 1 MG/10 ML FOR IR/CATH LAB
INTRA_ARTERIAL | Status: AC
  Filled 2017-11-13: qty 10

## 2017-11-13 MED ORDER — HEPARIN (PORCINE) IN NACL 2-0.9 UNIT/ML-% IJ SOLN
INTRAMUSCULAR | Status: AC | PRN
  Administered 2017-11-13 (×2): 500 mL

## 2017-11-13 MED ORDER — ASPIRIN EC 81 MG PO TBEC: 81.0000 mg | DELAYED_RELEASE_TABLET | Freq: Every day | ORAL | Status: DC

## 2017-11-13 MED ORDER — IOPAMIDOL (ISOVUE-370) INJECTION 76%
INTRAVENOUS | Status: AC
  Filled 2017-11-13: qty 150

## 2017-11-13 MED ORDER — SODIUM CHLORIDE 0.9% FLUSH
3.0000 mL | Freq: Two times a day (BID) | INTRAVENOUS | Status: DC
  Administered 2017-11-14 – 2017-11-15 (×3): 3 mL via INTRAVENOUS

## 2017-11-13 MED ORDER — INSULIN DETEMIR 100 UNIT/ML ~~LOC~~ SOLN
40.0000 [IU] | Freq: Every day | SUBCUTANEOUS | Status: DC
  Administered 2017-11-13 – 2017-11-14 (×2): 40 [IU] via SUBCUTANEOUS
  Filled 2017-11-13 (×2): qty 0.4

## 2017-11-13 MED ORDER — NITROGLYCERIN 0.4 MG SL SUBL: 0.4000 mg | SUBLINGUAL_TABLET | SUBLINGUAL | Status: DC | PRN

## 2017-11-13 MED ORDER — ATROPINE SULFATE 1 MG/10ML IJ SOSY
PREFILLED_SYRINGE | INTRAMUSCULAR | Status: DC | PRN
  Administered 2017-11-13: 1 mg via INTRAVENOUS

## 2017-11-13 MED ORDER — TICAGRELOR 90 MG PO TABS
ORAL_TABLET | ORAL | Status: DC | PRN
  Administered 2017-11-13: 180 mg via ORAL

## 2017-11-13 MED ORDER — SODIUM CHLORIDE 0.9 % IV SOLN: 250.0000 mL | INTRAVENOUS | Status: DC | PRN

## 2017-11-13 MED ORDER — HEPARIN (PORCINE) IN NACL 2-0.9 UNIT/ML-% IJ SOLN
INTRAMUSCULAR | Status: AC
  Filled 2017-11-13: qty 1000

## 2017-11-13 MED ORDER — ONDANSETRON HCL 4 MG/2ML IJ SOLN: 4.0000 mg | Freq: Four times a day (QID) | INTRAMUSCULAR | Status: DC | PRN

## 2017-11-13 MED ORDER — IOPAMIDOL (ISOVUE-370) INJECTION 76%
INTRAVENOUS | Status: AC
  Filled 2017-11-13: qty 50

## 2017-11-13 MED ORDER — HEPARIN SODIUM (PORCINE) 1000 UNIT/ML IJ SOLN
INTRAMUSCULAR | Status: AC
  Filled 2017-11-13: qty 1

## 2017-11-13 MED ORDER — METHYLPREDNISOLONE SODIUM SUCC 125 MG IJ SOLR
INTRAMUSCULAR | Status: DC | PRN
  Administered 2017-11-13: 125 mg via INTRAVENOUS

## 2017-11-13 MED ORDER — LIDOCAINE HCL (PF) 1 % IJ SOLN
INTRAMUSCULAR | Status: AC
  Filled 2017-11-13: qty 30

## 2017-11-13 MED ORDER — HEPARIN SODIUM (PORCINE) 5000 UNIT/ML IJ SOLN: 5000.0000 [IU] | Freq: Three times a day (TID) | INTRAMUSCULAR | Status: DC

## 2017-11-13 MED ORDER — ASPIRIN 81 MG PO CHEW: 81.0000 mg | CHEWABLE_TABLET | Freq: Every day | ORAL | Status: DC

## 2017-11-13 MED ORDER — DIPHENHYDRAMINE HCL 50 MG/ML IJ SOLN
INTRAMUSCULAR | Status: DC | PRN
  Administered 2017-11-13: 25 mg via INTRAVENOUS

## 2017-11-13 MED ORDER — ATORVASTATIN CALCIUM 80 MG PO TABS
80.0000 mg | ORAL_TABLET | Freq: Every day | ORAL | Status: DC
  Administered 2017-11-13 – 2017-11-14 (×2): 80 mg via ORAL
  Filled 2017-11-13 (×2): qty 1

## 2017-11-13 MED ORDER — ATROPINE SULFATE 1 MG/10ML IJ SOSY
PREFILLED_SYRINGE | INTRAMUSCULAR | Status: AC
  Filled 2017-11-13: qty 10

## 2017-11-13 MED ORDER — FENTANYL CITRATE (PF) 100 MCG/2ML IJ SOLN
INTRAMUSCULAR | Status: AC
  Filled 2017-11-13: qty 2

## 2017-11-13 MED ORDER — VERAPAMIL HCL 2.5 MG/ML IV SOLN
INTRAVENOUS | Status: DC | PRN
  Administered 2017-11-13: 10 mL via INTRA_ARTERIAL

## 2017-11-13 MED ORDER — FENTANYL CITRATE (PF) 100 MCG/2ML IJ SOLN
INTRAMUSCULAR | Status: DC | PRN
  Administered 2017-11-13: 25 ug via INTRAVENOUS

## 2017-11-13 MED ORDER — NITROGLYCERIN 1 MG/10 ML FOR IR/CATH LAB
INTRA_ARTERIAL | Status: DC | PRN
  Administered 2017-11-13 (×2): 200 ug via INTRACORONARY

## 2017-11-13 MED ORDER — INSULIN ASPART 100 UNIT/ML ~~LOC~~ SOLN
0.0000 [IU] | Freq: Three times a day (TID) | SUBCUTANEOUS | Status: DC
  Administered 2017-11-13: 8 [IU] via SUBCUTANEOUS
  Administered 2017-11-13 (×2): 15 [IU] via SUBCUTANEOUS
  Administered 2017-11-14: 2 [IU] via SUBCUTANEOUS
  Administered 2017-11-14 (×2): 5 [IU] via SUBCUTANEOUS

## 2017-11-13 MED ORDER — SODIUM CHLORIDE 0.9% FLUSH: 3.0000 mL | INTRAVENOUS | Status: DC | PRN

## 2017-11-13 MED ORDER — TICAGRELOR 90 MG PO TABS
90.0000 mg | ORAL_TABLET | Freq: Two times a day (BID) | ORAL | Status: DC
  Administered 2017-11-13 – 2017-11-15 (×4): 90 mg via ORAL
  Filled 2017-11-13 (×4): qty 1

## 2017-11-13 MED ORDER — TICAGRELOR 90 MG PO TABS: 90.0000 mg | ORAL_TABLET | Freq: Two times a day (BID) | ORAL | Status: DC

## 2017-11-13 MED ORDER — SODIUM CHLORIDE 0.9 % IV SOLN
INTRAVENOUS | Status: AC | PRN
  Administered 2017-11-13: 100 mL/h via INTRAVENOUS

## 2017-11-13 MED ORDER — METHYLPREDNISOLONE SODIUM SUCC 125 MG IJ SOLR
INTRAMUSCULAR | Status: AC
  Filled 2017-11-13: qty 2

## 2017-11-13 MED ORDER — DIPHENHYDRAMINE HCL 50 MG/ML IJ SOLN
INTRAMUSCULAR | Status: AC
  Filled 2017-11-13: qty 1

## 2017-11-13 MED ORDER — HEPARIN SODIUM (PORCINE) 1000 UNIT/ML IJ SOLN
INTRAMUSCULAR | Status: DC | PRN
  Administered 2017-11-13: 9000 [IU] via INTRAVENOUS

## 2017-11-13 MED ORDER — TICAGRELOR 90 MG PO TABS
ORAL_TABLET | ORAL | Status: AC
  Filled 2017-11-13: qty 2

## 2017-11-13 MED ORDER — VERAPAMIL HCL 2.5 MG/ML IV SOLN
INTRAVENOUS | Status: AC
  Filled 2017-11-13: qty 2

## 2017-11-13 MED ORDER — HEPARIN SODIUM (PORCINE) 5000 UNIT/ML IJ SOLN
5000.0000 [IU] | Freq: Three times a day (TID) | INTRAMUSCULAR | Status: DC
  Administered 2017-11-13 – 2017-11-15 (×5): 5000 [IU] via SUBCUTANEOUS
  Filled 2017-11-13 (×5): qty 1

## 2017-11-13 MED ORDER — SODIUM CHLORIDE 0.9 % WEIGHT BASED INFUSION: 1.0000 mL/kg/h | INTRAVENOUS | Status: AC

## 2017-11-13 SURGICAL SUPPLY — 20 items
BALLN SAPPHIRE 2.5X15 (BALLOONS) ×2
BALLN SAPPHIRE ~~LOC~~ 3.25X12 (BALLOONS) ×1 IMPLANT
BALLOON SAPPHIRE 2.5X15 (BALLOONS) IMPLANT
CATH 5FR JL3.5 JR4 ANG PIG MP (CATHETERS) ×1 IMPLANT
CATH LAUNCHER 6FR AL1 (CATHETERS) IMPLANT
CATHETER LAUNCHER 6FR AL1 (CATHETERS) ×2
DEVICE RAD COMP TR BAND LRG (VASCULAR PRODUCTS) ×1 IMPLANT
ELECT DEFIB PAD ADLT CADENCE (PAD) ×1 IMPLANT
GLIDESHEATH SLEND SS 6F .021 (SHEATH) ×1 IMPLANT
GUIDEWIRE INQWIRE 1.5J.035X260 (WIRE) IMPLANT
INQWIRE 1.5J .035X260CM (WIRE) ×2
KIT ENCORE 26 ADVANTAGE (KITS) ×1 IMPLANT
KIT HEART LEFT (KITS) ×2 IMPLANT
PACK CARDIAC CATHETERIZATION (CUSTOM PROCEDURE TRAY) ×2 IMPLANT
STENT SYNERGY DES 2.25X28 (Permanent Stent) ×1 IMPLANT
STENT SYNERGY DES 3X20 (Permanent Stent) ×1 IMPLANT
SYR MEDRAD MARK V 150ML (SYRINGE) ×2 IMPLANT
TRANSDUCER W/STOPCOCK (MISCELLANEOUS) ×2 IMPLANT
TUBING CIL FLEX 10 FLL-RA (TUBING) ×2 IMPLANT
WIRE ASAHI PROWATER 180CM (WIRE) ×1 IMPLANT

## 2017-11-13 NOTE — Care Management Note (Signed)
Case Management Note Marvetta Gibbons RN, BSN Unit 4E-Case Manager--- 2H coverage [phone]  Patient Details  Name: DAMASO LADAY MRN: [id number] Date of Birth: 01-24-1946  Subjective/Objective:  Pt admitted with STEMI s/p DES                   Action/Plan: PTA pt lived at home- referral for Brilinta needs - per insurance check pt has copay of $47/mo- spoke with pt at bedside- provided 30 day free card-(placed in pt belonging bag) pt states [facility] uses [facility] in [place]. - CM to continue to follow for further transition of care needs  Expected Discharge Date:  11/17/17               Expected Discharge Plan:     In-House Referral:     Discharge planning Services  CM Consult, Medication Assistance  Post Acute Care Choice:    Choice offered to:     DME Arranged:    DME Agency:     HH Arranged:    HH Agency:     Status of Service:  In process, will continue to follow  If discussed at Long Length of Stay Meetings, dates discussed:    Additional Comments:  Dawayne Patricia, RN 11/13/2017, 8:17 PM

## 2017-11-13 NOTE — Progress Notes (Signed)
Per insurance check on Brilinta  # 3.  S/W MALINDA @ [employer] # [phone] OPT- 2    BRILINTA 90 MG BID  COVER- YES  CO-PAY- $ 47.00  TIER- 3 DRUG  PRIOR APPROVAL- NO   PREFERRED PHARMACY : [facility]

## 2017-11-13 NOTE — H&P (Signed)
History & Physical    Patient ID: Dennis [name] MRN: [id number], DOB/AGE: 10-07-1945   Admit date: 11/13/2017   Primary Physician: No primary care provider on file. Primary Cardiologist: [place] (Dr. Martinique)  Patient Profile    72 yo male with PMH of CAD (stenting to RCA, LCx '06), DM, HTN, HL, chronic back pain, OA and tobacco use who presented with sudden of diaphoresis and chest pain.   Past Medical History   Past Medical History:  Diagnosis Date  . Chronic pain   . Diabetes mellitus ([place])   . Hyperlipidemia   . Hypertension   . Tobacco use     History reviewed. No pertinent surgical history.   Allergies  Allergies  Allergen Reactions  . Iodine   . Sulfamethoxazole-Trimethoprim     History of Present Illness    Dennis [name] is a 72 yo male with PMH CAD (stenting to RCA, LCx '06), DM, HTN, HL, chronic back pain, OA and tobacco use. Reports he had a cath back in 2006 with stents to the RCA and Lcx. Was followed for short period of time by cardiologist then lost to follow up. Reports he was referred to an Oncologist from the pain clinic after finding a lesion on his thoracic spine. Eventually underwent PET scan that noted no hypermetabolic neoplasm. States he did not undergo any further treatment. Followed by his PCP in [facility] for chronic issues. Reports living at home with his wife. Had a fall several days ago and bruised his right leg.   This morning he woke up around 2am with severe diaphoresis.Developed some left sided chest pressure and called his wife into the room. She checked his CBG which was normal. Gave him some soda and crackers. Chest pain worsened and she brought him to [facility]. There STEMI was called and he was transferred to the Cath lab for emergent cardiac cath.   Home Medications    Prior to Admission medications   Not on File    Family History    Family History  Problem Relation Age of Onset  . Hypertension Father   . Heart disease Father      Social History    Social History   Socioeconomic History  . Marital status: Married    Spouse name: Not on file  . Number of children: Not on file  . Years of education: Not on file  . Highest education level: Not on file  Social Needs  . Financial resource strain: Not on file  . Food insecurity - worry: Not on file  . Food insecurity - inability: Not on file  . Transportation needs - medical: Not on file  . Transportation needs - non-medical: Not on file  Occupational History  . Not on file  Tobacco Use  . Smoking status: Not on file  Substance and Sexual Activity  . Alcohol use: Not on file  . Drug use: Not on file  . Sexual activity: Not on file  Other Topics Concern  . Not on file  Social History Narrative  . Not on file     Review of Systems    See HPI  All other systems reviewed and are otherwise negative except as noted above.  Physical Exam    Blood pressure 114/70, pulse 91, resp. rate 15, height 6' (1.829 m), weight 196 lb 6.9 oz (89.1 kg), SpO2 95 %.  General: Pleasant, NAD Psych: Normal affect. Neuro: Alert and oriented X 3. Moves all extremities spontaneously. HEENT:  Normal  Neck: Supple without bruits or JVD. Lungs:  Resp regular and unlabored, CTA. Heart: RRR no s3, s4, or murmurs. Abdomen: Soft, non-tender, non-distended, BS + x 4.  Extremities: No clubbing, cyanosis or edema. DP/PT/Radials 2+ and equal bilaterally. Right TR band in place.   Labs    Troponin (Point of Care Test) No results for input(s): TROPIPOC in the last 72 hours. No results for input(s): CKTOTAL, CKMB, TROPONINI in the last 72 hours. No results found for: WBC, HGB, HCT, MCV, PLT No results for input(s): NA, K, CL, CO2, BUN, CREATININE, CALCIUM, PROT, BILITOT, ALKPHOS, ALT, AST, GLUCOSE in the last 168 hours.  Invalid input(s): LABALBU No results found for: CHOL, HDL, LDLCALC, TRIG No results found for: [facility]   Radiology Studies    No results found.  ECG &  Cardiac Imaging    EKG: SR with ST elevation in inferior leads.   Assessment & Plan    72 yo male with PMH of CAD (stenting to RCA, LCx '06), DM, HTN, HL, chronic back pain, OA and tobacco use who presented with sudden of diaphoresis and chest pain.   1. Acute STEMI: Pain started around 2am and wife brought him to [facility]. Code STEMI called and transferred to [facility] for emergent cath. Underwent successful PCI/DES x2 to RCA with Dr. Martinique. Noted to be bradycardiac in the cath lab and given atropine with improvement. Will hold on antihypertensive therapy for now until blood pressure and heart rate remain stable. 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 HL: statin added post cath -- check lipids  4. HTN: hold on adding Bp medications at this time as he was bradycardiac in the lab requiring atropine.   5. Tobacco Use: Reports 2 packs/week. Cessation advised.   Barnet Pall, NP-C Pager [phone] 11/13/2017, 8:12 AM

## 2017-11-14 ENCOUNTER — Encounter (HOSPITAL_COMMUNITY): Payer: Self-pay

## 2017-11-14 ENCOUNTER — Inpatient Hospital Stay (HOSPITAL_COMMUNITY): Payer: Medicare HMO

## 2017-11-14 DIAGNOSIS — I2119 ST elevation (STEMI) myocardial infarction involving other coronary artery of inferior wall: Principal | ICD-10-CM

## 2017-11-14 DIAGNOSIS — I219 Acute myocardial infarction, unspecified: Secondary | ICD-10-CM

## 2017-11-14 DIAGNOSIS — Z720 Tobacco use: Secondary | ICD-10-CM

## 2017-11-14 DIAGNOSIS — E1159 Type 2 diabetes mellitus with other circulatory complications: Secondary | ICD-10-CM

## 2017-11-14 LAB — ECHOCARDIOGRAM COMPLETE
HEIGHTINCHES: 72 in
Weight: 3100.55 oz

## 2017-11-14 LAB — CBC
HEMATOCRIT: 36.9 % — AB (ref 39.0–52.0)
Hemoglobin: 12.1 g/dL — ABNORMAL LOW (ref 13.0–17.0)
MCH: 28.6 pg (ref 26.0–34.0)
MCHC: 32.8 g/dL (ref 30.0–36.0)
MCV: 87.2 fL (ref 78.0–100.0)
PLATELETS: 284 10*3/uL (ref 150–400)
RBC: 4.23 MIL/uL (ref 4.22–5.81)
RDW: 13.8 % (ref 11.5–15.5)
WBC: 21.8 10*3/uL — ABNORMAL HIGH (ref 4.0–10.5)

## 2017-11-14 LAB — GLUCOSE, CAPILLARY
GLUCOSE-CAPILLARY: 242 mg/dL — AB (ref 65–99)
GLUCOSE-CAPILLARY: 150 mg/dL — AB (ref 65–99)
GLUCOSE-CAPILLARY: 97 mg/dL (ref 65–99)
Glucose-Capillary: 228 mg/dL — ABNORMAL HIGH (ref 65–99)

## 2017-11-14 LAB — BASIC METABOLIC PANEL
Anion gap: 8 (ref 5–15)
BUN: 25 mg/dL — AB (ref 6–20)
CO2: 23 mmol/L (ref 22–32)
Calcium: 8.6 mg/dL — ABNORMAL LOW (ref 8.9–10.3)
Chloride: 104 mmol/L (ref 101–111)
Creatinine, Ser: 1.3 mg/dL — ABNORMAL HIGH (ref 0.61–1.24)
GFR calc Af Amer: >60 mL/min (ref >60)
GFR, EST NON AFRICAN AMERICAN: 54 mL/min — AB (ref >60)
GLUCOSE: 247 mg/dL — AB (ref 65–99)
POTASSIUM: 4.1 mmol/L (ref 3.5–5.1)
SODIUM: 135 mmol/L (ref 135–145)

## 2017-11-14 LAB — LIPID PANEL
Cholesterol: 117 mg/dL (ref 0–200)
HDL: 33 mg/dL — ABNORMAL LOW (ref >40)
LDL CALC: 70 mg/dL (ref 0–99)
TRIGLYCERIDES: 72 mg/dL (ref <150)
Total CHOL/HDL Ratio: 3.5 RATIO
VLDL: 14 mg/dL (ref 0–40)

## 2017-11-14 LAB — TROPONIN I: Troponin I: 56.15 ng/mL (ref <0.03)

## 2017-11-14 MED FILL — Heparin Sodium (Porcine) 2 Unit/ML in Sodium Chloride 0.9%: INTRAMUSCULAR | Qty: 1000 | Status: AC

## 2017-11-14 NOTE — Progress Notes (Addendum)
Progress Note  Patient Name: Dennis [name] Date of Encounter: 11/14/2017  Primary Cardiologist: No primary care provider on file.   Subjective   No chest pain.  Feels ok.  Inpatient Medications    Scheduled Meds: . aspirin EC  81 mg Oral Daily  . atorvastatin  80 mg Oral q1800  . heparin  5,000 Units Subcutaneous Q8H  . insulin aspart  0-15 Units Subcutaneous TID WC  . insulin detemir  40 Units Subcutaneous QHS  . sodium chloride flush  3 mL Intravenous Q12H  . ticagrelor  90 mg Oral BID   Continuous Infusions: . sodium chloride     PRN Meds: sodium chloride, acetaminophen, nitroGLYCERIN, ondansetron (ZOFRAN) IV, sodium chloride flush   Vital Signs    Vitals:   11/14/17 1000 11/14/17 1100 11/14/17 1200 11/14/17 1244  BP: (!) 96/52 (!) 101/49 (!) 98/52 104/69  Pulse: (!) 54 (!) 50 60 (!) 54  Resp: 16 (!) 9 12 16   Temp:  98.1 F (36.7 C)  (!) 97.5 F (36.4 C)  TempSrc:  Oral  Oral  SpO2: 99% 99% 100% 100%  Weight:      Height:        Intake/Output Summary (Last 24 hours) at 11/14/2017 1615 Last data filed at 11/14/2017 1300 Gross per 24 hour  Intake 797.1 ml  Output 100 ml  Net 697.1 ml   Filed Weights   11/13/17 0549 11/13/17 0930 11/14/17 0400  Weight: 196 lb 6.9 oz (89.1 kg) 196 lb 13.9 oz (89.3 kg) 193 lb 12.6 oz (87.9 kg)    Telemetry    NSR - Personally Reviewed  ECG    Sinus bradycardia, inferior Q waves and ST changes - Personally Reviewed  Physical Exam   GEN: No acute distress.   Neck: No JVD Cardiac: RRR, no murmurs, rubs, or gallops.  Respiratory: Clear to auscultation bilaterally. GI: Soft, nontender, non-distended  MS: No edema; No deformity. Neuro:  Nonfocal  Psych: Normal affect   Labs    Chemistry Recent Labs  Lab 11/13/17 1022 11/14/17 0500  NA  --  135  K  --  4.1  CL  --  104  CO2  --  23  GLUCOSE  --  247*  BUN  --  25*  CREATININE 1.31* 1.30*  CALCIUM  --  8.6*  GFRNONAA 54* 54*  GFRAA >60 >60  ANIONGAP   --  8     Hematology Recent Labs  Lab 11/13/17 1022 11/14/17 0500  WBC 8.9 21.8*  RBC 4.59 4.23  HGB 13.5 12.1*  HCT 40.3 36.9*  MCV 87.8 87.2  MCH 29.4 28.6  MCHC 33.5 32.8  RDW 13.7 13.8  PLT 263 284    Cardiac Enzymes Recent Labs  Lab 11/13/17 1022 11/13/17 1455 11/13/17 2319  TROPONINI >65.00* >65.00* 56.15*   No results for input(s): TROPIPOC in the last 168 hours.   BNPNo results for input(s): BNP, PROBNP in the last 168 hours.   DDimer No results for input(s): DDIMER in the last 168 hours.   Radiology    No results found.  Cardiac Studies   Cath films reviewed  Patient Profile     72 y.o. male with inferior STEMI  Assessment & Plan    1) Continue DAPT along with other secondary prevention.  LDL 70.  Continue high dose statin.   2) Transfer to tele.   DM: Elevated glucose this AM.  Insulin for DM.   Longterm, he will need  to be compliant with his DAPT and regular office visits.  Possible discharge tomorrow.    He needs to stop smoking.   For questions or updates, please contact [place] Please consult [email or web address] for contact info under Cardiology/STEMI.      Signed, Larae Grooms, MD  11/14/2017, 4:15 PM

## 2017-11-14 NOTE — Progress Notes (Signed)
  Echocardiogram 2D Echocardiogram has been performed.  Jennette Dubin 11/14/2017, 11:53 AM

## 2017-11-14 NOTE — Progress Notes (Signed)
Inpatient Diabetes Program Recommendations  AACE/ADA: New Consensus Statement on Inpatient Glycemic Control (2015)  Target Ranges:  Prepandial:   less than 140 mg/dL      Peak postprandial:   less than 180 mg/dL (1-2 hours)      Critically ill patients:  140 - 180 mg/dL   Lab Results  Component Value Date   GLUCAP 242 (H) 11/14/2017    Review of Glycemic Control Results for Dennis [name], Dennis [name] (MRN [id number]) as of 11/14/2017 09:26  Ref. Range 11/13/2017 08:10 11/13/2017 11:26 11/13/2017 15:52 11/13/2017 22:08 11/14/2017 08:02  Glucose-Capillary Latest Ref Range: 65 - 99 mg/dL 368 (H) 345 (H) 267 (H) 265 (H) 242 (H)   Diabetes history: DM2 Outpatient Diabetes medications: Tresiba 50 units qd Current orders for Inpatient glycemic control: Levemir 40 units q hs + Novolog 0-15 units tid  Inpatient Diabetes Program Recommendations:   -Increase Levemir to 50 qd Noted pending A1c  Thank you, Nani Gasser. Blessyn Sommerville, RN, MSN, CDE  Diabetes Coordinator Inpatient Glycemic Control Team Team Pager [phone] (8am-5pm) 11/14/2017 9:27 AM

## 2017-11-14 NOTE — Plan of Care (Signed)
  Progressing Activity: Risk for activity intolerance will decrease 11/14/2017 0755 - Progressing by Jenne Campus, RN Nutrition: Adequate nutrition will be maintained 11/14/2017 0755 - Progressing by Jenne Campus, RN Elimination: Will not experience complications related to bowel motility 11/14/2017 0755 - Progressing by Jenne Campus, RN Pain Managment: General experience of comfort will improve 11/14/2017 0755 - Progressing by Jenne Campus, RN Safety: Ability to remain free from injury will improve 11/14/2017 0755 - Progressing by Jenne Campus, RN Skin Integrity: Risk for impaired skin integrity will decrease 11/14/2017 0755 - Progressing by Jenne Campus, RN

## 2017-11-14 NOTE — Progress Notes (Signed)
CARDIAC REHAB PHASE I   PRE:  Rate/Rhythm: 60 SR  BP:  Supine:   Sitting: 107/59  Standing:    SaO2: 100%RA  MODE:  Ambulation: 430 ft   POST:  Rate/Rhythm: 90 SR  BP:  Supine:   Sitting: 117/51  Standing:    SaO2: 100%RA 1315-1430 Pt walked 430 ft on RA with rolling walker and gait belt use and asst x 1. Pt usually uses two canes to walk which he has here. Upon standing with the canes, he became wobbly. Had pt sit back down and I put on gait belt and encouraged walker. He has standard walker at home. Pt walked with slow steady gait. He does not want PT consult as he stated this is how he normally walks. He has had some falls at home. Discussed that he will be on brilinta and needs to be very careful when up. He has seen case manager and has brilinta card. Discussed importance of brilinta with stent. Reviewed NTG use, smoking cessation, CRP 2, MI restrictions with pt and wife. Pt had difficulty with teach back and will need reinforcement from family. Did not want fake cigarette. Encouraged to call [phone] if needed. Will refer to [facility] CRP 2. Left diabetic and heart healthy diets. Encouraged walking with walker as tolerated. Pt stated mainly walks in house. To bed with bed alarm on.   Graylon Good, RN BSN  11/14/2017 2:27 PM

## 2017-11-14 NOTE — Progress Notes (Signed)
  Echocardiogram 2D Echocardiogram was attempted but patient was eating breakfast. Will try back later.  Jennette Dubin 11/14/2017, 8:59 AM

## 2017-11-15 ENCOUNTER — Other Ambulatory Visit: Payer: Self-pay | Admitting: Cardiology

## 2017-11-15 ENCOUNTER — Telehealth: Payer: Self-pay

## 2017-11-15 ENCOUNTER — Encounter (HOSPITAL_COMMUNITY): Payer: Self-pay | Admitting: Cardiology

## 2017-11-15 DIAGNOSIS — E782 Mixed hyperlipidemia: Secondary | ICD-10-CM

## 2017-11-15 DIAGNOSIS — E785 Hyperlipidemia, unspecified: Secondary | ICD-10-CM

## 2017-11-15 LAB — HEMOGLOBIN A1C
Hgb A1c MFr Bld: 8 % — ABNORMAL HIGH (ref 4.8–5.6)
Mean Plasma Glucose: 183 mg/dL

## 2017-11-15 LAB — GLUCOSE, CAPILLARY
GLUCOSE-CAPILLARY: 58 mg/dL — AB (ref 65–99)
GLUCOSE-CAPILLARY: 102 mg/dL — AB (ref 65–99)

## 2017-11-15 MED ORDER — TICAGRELOR 90 MG PO TABS: 90.0000 mg | ORAL_TABLET | Freq: Two times a day (BID) | ORAL | 2 refills | Status: DC

## 2017-11-15 MED ORDER — NITROGLYCERIN 0.4 MG SL SUBL: 0.4000 mg | SUBLINGUAL_TABLET | SUBLINGUAL | 2 refills | Status: DC | PRN

## 2017-11-15 MED ORDER — ATORVASTATIN CALCIUM 80 MG PO TABS: 80.0000 mg | ORAL_TABLET | Freq: Every day | ORAL | 1 refills | Status: DC

## 2017-11-15 MED ORDER — SODIUM CHLORIDE 0.9 % IV SOLN
INTRAVENOUS | Status: AC
Start: 2017-11-15 — End: 2017-11-15
  Administered 2017-11-15: 07:00:00 via INTRAVENOUS

## 2017-11-15 MED ORDER — PNEUMOCOCCAL VAC POLYVALENT 25 MCG/0.5ML IJ INJ: 0.5000 mL | INJECTION | INTRAMUSCULAR | Status: DC

## 2017-11-15 MED ORDER — ASPIRIN 81 MG PO TBEC: 81.0000 mg | DELAYED_RELEASE_TABLET | Freq: Every day | ORAL | Status: DC

## 2017-11-15 NOTE — Progress Notes (Signed)
Discharge instruction was given to pt and family.  Pt received Brilinta card.  Idolina Primer, RN

## 2017-11-15 NOTE — Progress Notes (Signed)
CBG was 58 this morning.  Pt was having BR that time and asymptomatic.  Recheck CBG was 102.  Idolina Primer, RN

## 2017-11-15 NOTE — Progress Notes (Signed)
[id number] Offered to walk with pt but he declined at this time. Stated his back is hurting too much and he is going home later.  Encouraged pt to watch carbs and sodium. He stated he had to have his salt. Encouraged a lot of green vegs, lean meats, whole grain. Wife not here at this time. Encouraged him to read diets and have his wife read them also. Graylon Good RN BSN 11/15/2017 10:03 AM

## 2017-11-15 NOTE — Telephone Encounter (Signed)
Called pt., no answer & no voicemail. I will try to reach pt again later today.

## 2017-11-15 NOTE — Discharge Summary (Addendum)
Discharge Summary    Patient ID: [name],  MRN: [id number], DOB/AGE: 1945-12-14 72 y.o.  Admit date: 11/13/2017 Discharge date: 11/15/2017  Primary Care Provider: Patient, No Pcp Per Primary Cardiologist: Dennis [name])  Discharge Diagnoses    Principal Problem:   STEMI involving oth coronary artery of inferior wall (Dennis [name]) Active Problems:   Diabetes mellitus (Dennis [name])   Tobacco abuse   STEMI involving right coronary artery (Dennis [name])   STEMI (ST elevation myocardial infarction) (Dennis [name])   Hyperlipidemia   Allergies Allergies  Allergen Reactions  . Fish Allergy Swelling    ALL fish  . Gabapentin Other (See Comments)  . Tamsulosin Other (See Comments)  . Iodine   . Sulfamethoxazole-Trimethoprim   . Trimethoprim Other (See Comments)  . Tramadol Other (See Comments)    Diagnostic Studies/Procedures    Cath: 11/13/17  Conclusion     Mid RCA lesion is 100% stenosed.  Dist RCA lesion is 40% stenosed.  Prox RCA lesion is 50% stenosed.  Ost RPDA to RPDA lesion is 99% stenosed.  Prox LAD to Mid LAD lesion is 60% stenosed.  Ost 1st Diag lesion is 90% stenosed.  Ost 2nd Diag to 2nd Diag lesion is 85% stenosed.  Ost 2nd Mrg to 2nd Mrg lesion is 40% stenosed.  Ost Cx to Prox Cx lesion is 40% stenosed.  Ost 1st Mrg to 1st Mrg lesion is 70% stenosed.  The left ventricular systolic function is normal.  LV end diastolic pressure is mildly elevated.  The left ventricular ejection fraction is 55-65% by visual estimate.  Post intervention, there is a 0% residual stenosis.  A drug-eluting stent was successfully placed using a STENT SYNERGY DES 2.25X28.  Post intervention, there is a 0% residual stenosis.  A drug-eluting stent was successfully placed using a STENT SYNERGY DES 3X20.   1. 3 vessel obstructive CAD.    - diffuse 60% proximal to mid LAD with severe disease in small diagonal branches.    - 70% OM1. Stent in OM 2 is diffusely diseased but patent  - 100% mid RCA and 99% PDA 2. Good LV function 3. Mildly elevated LVEDP 4. Successful stenting of the PDA and mid RCA with DES x 2. Diffusely diseased and heavily calcified RCA.  Plan: DAPT indefinitely. Aggressive risk factor modification and smoking cessation.    TTE: 11/14/17  Study Conclusions  - Left ventricle: The cavity size was normal. Systolic function was   normal. The estimated ejection fraction was in the range of 55%   to 60%. Possible mild hypokinesis of the basal-midinferior   myocardium. - Left atrium: The atrium was mildly dilated. _____________   History of Present Illness     72 yo male with PMH CAD (stenting to RCA, LCx '06), DM, HTN, HL, chronic back pain, OA and tobacco use. Reports he had a cath back in 2006 with stents to the RCA and OM. Was followed for short period of time by cardiologist then lost to follow up. Reports he was referred to an Oncologist from the pain clinic after finding a lesion on his thoracic spine. Eventually underwent PET scan that noted no hypermetabolic neoplasm. States he did not undergo any further treatment. Followed by his PCP in [facility] for chronic issues. Reports living at home with his wife. Had a fall several days prior to admission and bruised his right leg.   The morning of admission he woke up around 2am with severe diaphoresis. Developed some left sided chest pressure and  called his wife into the room. She checked his CBG which was normal. Gave him some soda and crackers. Chest pain worsened, along with shortness of breath and she brought him to [facility]. There STEMI was called and he was transferred to the Cath lab for emergent cardiac cath.   Hospital Course     Underwent cardiac cath noted above with 3v disease, 60% p/mLAD, 70% OM1 with stent in OM2 having ISR but patent, and 100% mRCA. Underwent successful PCI/DES x1 to mRCA and PDA x1. Noted mildly elevated LVEDP. Normal EF via LV gram. Plan for DAPT with ASA/Brilinta  for at least on year. He was given atropine post cath for bradycardia. Troponin peaked at >65. LDL 70, and Hgb A1c 8.0. Placed on high dose statin post cath. Unable to add BB or ACEi/ARB therapy as his heart rate and blood pressure would not tolerate. Worked with cardiac rehab using rolling walker. Recommended PT evaluation but patient refused. No further episodes of chest pain.   General: Chronically ill older male appearing in no acute distress. Head: Normocephalic, atraumatic.  Neck: Supple without bruits, JVD. Lungs:  Resp regular and unlabored, CTA. Heart: RRR, S1, S2, no S3, S4, or murmur; no rub. Abdomen: Soft, non-tender, non-distended with normoactive bowel sounds.  Extremities: No clubbing, cyanosis, edema. R radial cath site stable without bruising or hematoma Neuro: Alert and oriented X 3. Moves all extremities spontaneously. Psych: Normal affect.  Dennis [name] was seen by Dr. Irish Lack and determined stable for discharge home. Follow up in the office has been arranged. Medications are listed below.  _____________  Discharge Vitals Blood pressure 118/69, pulse 72, temperature 98 F (36.7 C), temperature source Oral, resp. rate 19, height 6' (1.829 m), weight 196 lb 10.4 oz (89.2 kg), SpO2 100 %.  Filed Weights   11/13/17 0930 11/14/17 0400 11/15/17 0617  Weight: 196 lb 13.9 oz (89.3 kg) 193 lb 12.6 oz (87.9 kg) 196 lb 10.4 oz (89.2 kg)    Labs & Radiologic Studies    CBC Recent Labs    11/13/17 1022 11/14/17 0500  WBC 8.9 21.8*  HGB 13.5 12.1*  HCT 40.3 36.9*  MCV 87.8 87.2  PLT 263 315   Basic Metabolic Panel Recent Labs    11/13/17 1022 11/14/17 0500  NA  --  135  K  --  4.1  CL  --  104  CO2  --  23  GLUCOSE  --  247*  BUN  --  25*  CREATININE 1.31* 1.30*  CALCIUM  --  8.6*   Liver Function Tests No results for input(s): AST, ALT, ALKPHOS, BILITOT, PROT, ALBUMIN in the last 72 hours. No results for input(s): LIPASE, AMYLASE in the last 72  hours. Cardiac Enzymes Recent Labs    11/13/17 1022 11/13/17 1455 11/13/17 2319  TROPONINI >65.00* >65.00* 56.15*   BNP Invalid input(s): POCBNP D-Dimer No results for input(s): DDIMER in the last 72 hours. Hemoglobin A1C Recent Labs    11/14/17 0500  HGBA1C 8.0*   Fasting Lipid Panel Recent Labs    11/14/17 0500  CHOL 117  HDL 33*  LDLCALC 70  TRIG 72  CHOLHDL 3.5   Thyroid Function Tests No results for input(s): TSH, T4TOTAL, T3FREE, THYROIDAB in the last 72 hours.  Invalid input(s): FREET3 _____________  No results found. Disposition   Pt is being discharged home today in good condition.  Follow-up Plans & Appointments    Follow-up Information    Imogene Burn,  PA-C Follow up on 12/04/2017.   Specialty:  Cardiology Why:  at 1:30pm for your follow up appt.  Contact information: [place] [address] [phone]        PCP Follow up.   Why:  Please follow up with your PCP regarding further management of your diabetes.          Discharge Instructions    Amb Referral to Cardiac Rehabilitation   Complete by:  As directed    Diagnosis:   STEMI Coronary Stents     Call MD for:  redness, tenderness, or signs of infection (pain, swelling, redness, odor or green/yellow discharge around incision site)   Complete by:  As directed    Diet - low sodium heart healthy   Complete by:  As directed    Discharge instructions   Complete by:  As directed    Radial Site Care Refer to this sheet in the next few weeks. These instructions provide you with information on caring for yourself after your procedure. Your caregiver may also give you more specific instructions. Your treatment has been planned according to current medical practices, but problems sometimes occur. Call your caregiver if you have any problems or questions after your procedure. HOME CARE INSTRUCTIONS You may shower the day after the procedure.Remove the bandage (dressing) and  gently wash the site with plain soap and water.Gently pat the site dry.  Do not apply powder or lotion to the site.  Do not submerge the affected site in water for 3 to 5 days.  Inspect the site at least twice daily.  Do not flex or bend the affected arm for 24 hours.  No lifting over 5 pounds (2.3 kg) for 5 days after your procedure.  Do not drive home if you are discharged the same day of the procedure. Have someone else drive you.  You may drive 24 hours after the procedure unless otherwise instructed by your caregiver.  What to expect: Any bruising will usually fade within 1 to 2 weeks.  Blood that collects in the tissue (hematoma) may be painful to the touch. It should usually decrease in size and tenderness within 1 to 2 weeks.  SEEK IMMEDIATE MEDICAL CARE IF: You have unusual pain at the radial site.  You have redness, warmth, swelling, or pain at the radial site.  You have drainage (other than a small amount of blood on the dressing).  You have chills.  You have a fever or persistent symptoms for more than 72 hours.  You have a fever and your symptoms suddenly get worse.  Your arm becomes pale, cool, tingly, or numb.  You have heavy bleeding from the site. Hold pressure on the site.   PLEASE DO NOT MISS ANY DOSES OF YOUR BRILINTA!!!!! Also keep a log of you blood pressures and bring back to your follow up appt. Please call the office with any questions.   Patients taking blood thinners should generally stay away from medicines like ibuprofen, Advil, Motrin, naproxen, and Aleve due to risk of stomach bleeding. You may take Tylenol as directed or talk to your primary doctor about alternatives.   Increase activity slowly   Complete by:  As directed       Discharge Medications     Medication List    TAKE these medications   aspirin 81 MG EC tablet Take 1 tablet (81 mg total) by mouth daily.   atorvastatin 80 MG tablet Commonly known as:  LIPITOR Take  1 tablet (80 mg  total) by mouth daily at 6 PM.   diazepam 10 MG tablet Commonly known as:  VALIUM Take 10 mg by mouth daily as needed for sleep or anxiety.   diclofenac sodium 1 % Gel Commonly known as:  VOLTAREN Apply 1 application topically 4 (four) times daily as needed for pain.   nitroGLYCERIN 0.4 MG SL tablet Commonly known as:  NITROSTAT Place 1 tablet (0.4 mg total) under the tongue every 5 (five) minutes x 3 doses as needed for chest pain.   ticagrelor 90 MG Tabs tablet Commonly known as:  BRILINTA Take 1 tablet (90 mg total) by mouth 2 (two) times daily.   TRESIBA FLEXTOUCH 200 UNIT/ML Sopn Generic drug:  Insulin Degludec Inject 50 Units into the skin daily.        Aspirin prescribed at discharge?  Yes High Intensity Statin Prescribed? (Lipitor 40-80mg  or Crestor 20-40mg ): Yes Beta Blocker Prescribed? No: Bradycardiac and soft blood pressures For EF <40%, was ACEI/ARB Prescribed? No: EF ok ADP Receptor Inhibitor Prescribed? (i.e. Plavix etc.-Includes Medically Managed Patients): Yes For EF <40%, Aldosterone Inhibitor Prescribed? No: EF ok Was EF assessed during THIS hospitalization? Yes Was Cardiac Rehab II ordered? (Included Medically managed Patients): Yes   Outstanding Labs/Studies   FLP/LFTs in 6 weeks if tolerating statin.   Duration of Discharge Encounter   Greater than 30 minutes including physician time.  Signed, Reino Bellis NP-C 11/15/2017, 10:08 AM   I have examined the patient and reviewed assessment and plan and discussed with patient.  Agree with above as stated.  RIght radial site intact.  I stressed the importance of DAPT.  I encouraged cardiac rehab.  He would like to f/u in [place] since that is closest to his home in [place]. He needs to stop smoking and be compliant with medications. COntinue high dose statin.  COntinue aggressive DM control.   Larae Grooms

## 2017-12-04 ENCOUNTER — Encounter: Payer: Self-pay | Admitting: Physician Assistant

## 2017-12-04 ENCOUNTER — Ambulatory Visit: Payer: Medicare HMO | Admitting: Physician Assistant

## 2017-12-04 VITALS — BP 130/70 | HR 93 | Ht 72.0 in | Wt 206.0 lb

## 2017-12-04 DIAGNOSIS — E0865 Diabetes mellitus due to underlying condition with hyperglycemia: Secondary | ICD-10-CM

## 2017-12-04 DIAGNOSIS — I2510 Atherosclerotic heart disease of native coronary artery without angina pectoris: Secondary | ICD-10-CM | POA: Diagnosis not present

## 2017-12-04 DIAGNOSIS — E782 Mixed hyperlipidemia: Secondary | ICD-10-CM | POA: Diagnosis not present

## 2017-12-04 DIAGNOSIS — Z720 Tobacco use: Secondary | ICD-10-CM

## 2017-12-04 DIAGNOSIS — Z9861 Coronary angioplasty status: Secondary | ICD-10-CM

## 2017-12-04 DIAGNOSIS — I10 Essential (primary) hypertension: Secondary | ICD-10-CM | POA: Insufficient documentation

## 2017-12-04 MED ORDER — METOPROLOL TARTRATE 25 MG PO TABS: 12.5000 mg | ORAL_TABLET | Freq: Two times a day (BID) | ORAL | 3 refills | Status: DC

## 2017-12-04 NOTE — Patient Instructions (Addendum)
Your physician wants you to follow-up in:  6 weeks in the [place] office with Dr. Jeannetta Ellis Simvastatin   Continue Atorvastatin    START Metoprolol 25 mg, take 1/2 tablet (12.5 mg)  twice a day    Get Lab work : lipids, and LFT's JUST BEFORE follow visit in 6 weeks   No test ordered today   Thank you for choosing [facility] !
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
## 2017-12-08 NOTE — Care Management (Signed)
This is a no charge note  Transfer from [facility] per Dr. Oren Section  72 year old man with past medical history of hypertension, hyperlipidemia, diabetes mellitus, CAD, STEMI, stent placement, tobacco abuse, who presents with fall and was found to right hip intertrochanteric fracture.   Pt had STEMI and underwent PCI and had stent placed on 11/13/17. Patient was found to have blood pressure was `26/69, heart rate 77, RR 16, oxygen sat 100% on room air, normal temperature, WBC 10.8, normal renal function. Patient had 2-D echo on 11/14/17 showed EF of 55-60%.  I asked EPD to consult ortho (Dr. Stann Mainland). If ortho surgeon agrees to consult on this pt, pt will be accepted to tele bed as inpt. EDP will consult ortho.   Please call manager of [facility] at [phone] when pt arrives to floor   Ivor Costa, MD  [facility] Pager [phone]  If 7PM-7AM, please contact night-coverage [email or web address] Password [facility] 12/08/2017, 10:11 PM

## 2017-12-09 ENCOUNTER — Inpatient Hospital Stay (HOSPITAL_COMMUNITY): Payer: Medicare HMO

## 2017-12-09 ENCOUNTER — Inpatient Hospital Stay (HOSPITAL_COMMUNITY)
Admission: AD | Admit: 2017-12-09 | Discharge: 2017-12-25 | DRG: 470 | Disposition: A | Discharge status: Skilled Nursing Facility | Payer: Medicare HMO | Source: Other Acute Inpatient Hospital | Attending: Internal Medicine | Admitting: Internal Medicine

## 2017-12-09 DIAGNOSIS — N390 Urinary tract infection, site not specified: Secondary | ICD-10-CM | POA: Diagnosis not present

## 2017-12-09 DIAGNOSIS — B957 Other staphylococcus as the cause of diseases classified elsewhere: Secondary | ICD-10-CM | POA: Diagnosis not present

## 2017-12-09 DIAGNOSIS — F0390 Unspecified dementia without behavioral disturbance: Secondary | ICD-10-CM | POA: Diagnosis present

## 2017-12-09 DIAGNOSIS — E1165 Type 2 diabetes mellitus with hyperglycemia: Secondary | ICD-10-CM | POA: Diagnosis present

## 2017-12-09 DIAGNOSIS — R451 Restlessness and agitation: Secondary | ICD-10-CM | POA: Diagnosis not present

## 2017-12-09 DIAGNOSIS — Z955 Presence of coronary angioplasty implant and graft: Secondary | ICD-10-CM

## 2017-12-09 DIAGNOSIS — Z7982 Long term (current) use of aspirin: Secondary | ICD-10-CM | POA: Diagnosis not present

## 2017-12-09 DIAGNOSIS — F05 Delirium due to known physiological condition: Secondary | ICD-10-CM | POA: Diagnosis not present

## 2017-12-09 DIAGNOSIS — H2710 Unspecified dislocation of lens: Secondary | ICD-10-CM | POA: Diagnosis present

## 2017-12-09 DIAGNOSIS — R2689 Other abnormalities of gait and mobility: Secondary | ICD-10-CM | POA: Diagnosis present

## 2017-12-09 DIAGNOSIS — S72009A Fracture of unspecified part of neck of unspecified femur, initial encounter for closed fracture: Secondary | ICD-10-CM | POA: Diagnosis present

## 2017-12-09 DIAGNOSIS — Z972 Presence of dental prosthetic device (complete) (partial): Secondary | ICD-10-CM

## 2017-12-09 DIAGNOSIS — W010XXA Fall on same level from slipping, tripping and stumbling without subsequent striking against object, initial encounter: Secondary | ICD-10-CM | POA: Diagnosis present

## 2017-12-09 DIAGNOSIS — Z96641 Presence of right artificial hip joint: Secondary | ICD-10-CM

## 2017-12-09 DIAGNOSIS — I5031 Acute diastolic (congestive) heart failure: Secondary | ICD-10-CM

## 2017-12-09 DIAGNOSIS — I5032 Chronic diastolic (congestive) heart failure: Secondary | ICD-10-CM | POA: Diagnosis present

## 2017-12-09 DIAGNOSIS — R0600 Dyspnea, unspecified: Secondary | ICD-10-CM

## 2017-12-09 DIAGNOSIS — R319 Hematuria, unspecified: Secondary | ICD-10-CM | POA: Diagnosis not present

## 2017-12-09 DIAGNOSIS — S72001A Fracture of unspecified part of neck of right femur, initial encounter for closed fracture: Secondary | ICD-10-CM | POA: Diagnosis not present

## 2017-12-09 DIAGNOSIS — I213 ST elevation (STEMI) myocardial infarction of unspecified site: Secondary | ICD-10-CM | POA: Diagnosis not present

## 2017-12-09 DIAGNOSIS — I480 Paroxysmal atrial fibrillation: Secondary | ICD-10-CM | POA: Diagnosis not present

## 2017-12-09 DIAGNOSIS — E538 Deficiency of other specified B group vitamins: Secondary | ICD-10-CM | POA: Diagnosis not present

## 2017-12-09 DIAGNOSIS — Z781 Physical restraint status: Secondary | ICD-10-CM

## 2017-12-09 DIAGNOSIS — Z885 Allergy status to narcotic agent status: Secondary | ICD-10-CM

## 2017-12-09 DIAGNOSIS — Z419 Encounter for procedure for purposes other than remedying health state, unspecified: Secondary | ICD-10-CM

## 2017-12-09 DIAGNOSIS — Z91013 Allergy to seafood: Secondary | ICD-10-CM

## 2017-12-09 DIAGNOSIS — Z794 Long term (current) use of insulin: Secondary | ICD-10-CM

## 2017-12-09 DIAGNOSIS — I10 Essential (primary) hypertension: Secondary | ICD-10-CM | POA: Diagnosis not present

## 2017-12-09 DIAGNOSIS — N183 Chronic kidney disease, stage 3 (moderate): Secondary | ICD-10-CM | POA: Diagnosis not present

## 2017-12-09 DIAGNOSIS — R339 Retention of urine, unspecified: Secondary | ICD-10-CM | POA: Diagnosis not present

## 2017-12-09 DIAGNOSIS — Z87891 Personal history of nicotine dependence: Secondary | ICD-10-CM | POA: Diagnosis not present

## 2017-12-09 DIAGNOSIS — E785 Hyperlipidemia, unspecified: Secondary | ICD-10-CM | POA: Diagnosis not present

## 2017-12-09 DIAGNOSIS — D62 Acute posthemorrhagic anemia: Secondary | ICD-10-CM | POA: Diagnosis not present

## 2017-12-09 DIAGNOSIS — Z8249 Family history of ischemic heart disease and other diseases of the circulatory system: Secondary | ICD-10-CM

## 2017-12-09 DIAGNOSIS — Z79899 Other long term (current) drug therapy: Secondary | ICD-10-CM

## 2017-12-09 DIAGNOSIS — E1122 Type 2 diabetes mellitus with diabetic chronic kidney disease: Secondary | ICD-10-CM | POA: Diagnosis present

## 2017-12-09 DIAGNOSIS — Z881 Allergy status to other antibiotic agents status: Secondary | ICD-10-CM

## 2017-12-09 DIAGNOSIS — R338 Other retention of urine: Secondary | ICD-10-CM | POA: Diagnosis not present

## 2017-12-09 DIAGNOSIS — E782 Mixed hyperlipidemia: Secondary | ICD-10-CM | POA: Diagnosis not present

## 2017-12-09 DIAGNOSIS — D72829 Elevated white blood cell count, unspecified: Secondary | ICD-10-CM | POA: Diagnosis not present

## 2017-12-09 DIAGNOSIS — E1169 Type 2 diabetes mellitus with other specified complication: Secondary | ICD-10-CM | POA: Diagnosis not present

## 2017-12-09 DIAGNOSIS — Z7902 Long term (current) use of antithrombotics/antiplatelets: Secondary | ICD-10-CM | POA: Diagnosis not present

## 2017-12-09 DIAGNOSIS — I4891 Unspecified atrial fibrillation: Secondary | ICD-10-CM

## 2017-12-09 DIAGNOSIS — Z9861 Coronary angioplasty status: Secondary | ICD-10-CM

## 2017-12-09 DIAGNOSIS — I2510 Atherosclerotic heart disease of native coronary artery without angina pectoris: Secondary | ICD-10-CM | POA: Diagnosis present

## 2017-12-09 DIAGNOSIS — I252 Old myocardial infarction: Secondary | ICD-10-CM | POA: Diagnosis not present

## 2017-12-09 DIAGNOSIS — I130 Hypertensive heart and chronic kidney disease with heart failure and stage 1 through stage 4 chronic kidney disease, or unspecified chronic kidney disease: Secondary | ICD-10-CM | POA: Diagnosis present

## 2017-12-09 DIAGNOSIS — Z008 Encounter for other general examination: Secondary | ICD-10-CM | POA: Diagnosis not present

## 2017-12-09 DIAGNOSIS — R410 Disorientation, unspecified: Secondary | ICD-10-CM

## 2017-12-09 DIAGNOSIS — Y92009 Unspecified place in unspecified non-institutional (private) residence as the place of occurrence of the external cause: Secondary | ICD-10-CM

## 2017-12-09 DIAGNOSIS — Z01810 Encounter for preprocedural cardiovascular examination: Secondary | ICD-10-CM | POA: Diagnosis not present

## 2017-12-09 DIAGNOSIS — S72001S Fracture of unspecified part of neck of right femur, sequela: Secondary | ICD-10-CM | POA: Diagnosis not present

## 2017-12-09 DIAGNOSIS — E876 Hypokalemia: Secondary | ICD-10-CM | POA: Diagnosis not present

## 2017-12-09 DIAGNOSIS — Z888 Allergy status to other drugs, medicaments and biological substances status: Secondary | ICD-10-CM

## 2017-12-09 DIAGNOSIS — Z882 Allergy status to sulfonamides status: Secondary | ICD-10-CM

## 2017-12-09 DIAGNOSIS — E7800 Pure hypercholesterolemia, unspecified: Secondary | ICD-10-CM | POA: Diagnosis not present

## 2017-12-09 DIAGNOSIS — E0865 Diabetes mellitus due to underlying condition with hyperglycemia: Secondary | ICD-10-CM

## 2017-12-09 DIAGNOSIS — R262 Difficulty in walking, not elsewhere classified: Secondary | ICD-10-CM | POA: Diagnosis not present

## 2017-12-09 DIAGNOSIS — I25811 Atherosclerosis of native coronary artery of transplanted heart without angina pectoris: Secondary | ICD-10-CM | POA: Diagnosis not present

## 2017-12-09 HISTORY — DX: Unspecified osteoarthritis, unspecified site: M19.90

## 2017-12-09 LAB — COMPREHENSIVE METABOLIC PANEL
ALBUMIN: 2.9 g/dL — AB (ref 3.5–5.0)
ALT: 19 U/L (ref 17–63)
AST: 25 U/L (ref 15–41)
Alkaline Phosphatase: 86 U/L (ref 38–126)
Anion gap: 10 (ref 5–15)
BUN: 18 mg/dL (ref 6–20)
CHLORIDE: 104 mmol/L (ref 101–111)
CO2: 22 mmol/L (ref 22–32)
Calcium: 8.3 mg/dL — ABNORMAL LOW (ref 8.9–10.3)
Creatinine, Ser: 1.25 mg/dL — ABNORMAL HIGH (ref 0.61–1.24)
GFR calc Af Amer: >60 mL/min (ref >60)
GFR calc non Af Amer: 57 mL/min — ABNORMAL LOW (ref >60)
GLUCOSE: 105 mg/dL — AB (ref 65–99)
POTASSIUM: 3.9 mmol/L (ref 3.5–5.1)
Sodium: 136 mmol/L (ref 135–145)
Total Bilirubin: 1.1 mg/dL (ref 0.3–1.2)
Total Protein: 6.6 g/dL (ref 6.5–8.1)

## 2017-12-09 LAB — CBC
HEMATOCRIT: 37.1 % — AB (ref 39.0–52.0)
Hemoglobin: 12.3 g/dL — ABNORMAL LOW (ref 13.0–17.0)
MCH: 29.3 pg (ref 26.0–34.0)
MCHC: 33.2 g/dL (ref 30.0–36.0)
MCV: 88.3 fL (ref 78.0–100.0)
PLATELETS: 246 10*3/uL (ref 150–400)
RBC: 4.2 MIL/uL — ABNORMAL LOW (ref 4.22–5.81)
RDW: 13.9 % (ref 11.5–15.5)
WBC: 11.8 10*3/uL — AB (ref 4.0–10.5)

## 2017-12-09 LAB — MRSA PCR SCREENING: MRSA BY PCR: NEGATIVE

## 2017-12-09 LAB — GLUCOSE, CAPILLARY
GLUCOSE-CAPILLARY: 98 mg/dL (ref 65–99)
Glucose-Capillary: 105 mg/dL — ABNORMAL HIGH (ref 65–99)

## 2017-12-09 MED ORDER — LISINOPRIL 5 MG PO TABS
5.0000 mg | ORAL_TABLET | Freq: Every day | ORAL | Status: DC
  Administered 2017-12-09 – 2017-12-25 (×17): 5 mg via ORAL
  Filled 2017-12-09 (×17): qty 1

## 2017-12-09 MED ORDER — DIAZEPAM 5 MG PO TABS
10.0000 mg | ORAL_TABLET | Freq: Two times a day (BID) | ORAL | Status: DC | PRN
  Administered 2017-12-12 – 2017-12-18 (×5): 10 mg via ORAL
  Filled 2017-12-09 (×6): qty 2

## 2017-12-09 MED ORDER — HYDROMORPHONE HCL 1 MG/ML IJ SOLN
1.0000 mg | INTRAMUSCULAR | Status: DC | PRN
  Administered 2017-12-09 – 2017-12-12 (×16): 1 mg via INTRAVENOUS
  Filled 2017-12-09 (×16): qty 1

## 2017-12-09 MED ORDER — ONDANSETRON HCL 4 MG/2ML IJ SOLN
4.0000 mg | Freq: Four times a day (QID) | INTRAMUSCULAR | Status: DC | PRN
  Administered 2017-12-18: 4 mg via INTRAVENOUS

## 2017-12-09 MED ORDER — TRAMADOL HCL 50 MG PO TABS: 50.0000 mg | ORAL_TABLET | Freq: Two times a day (BID) | ORAL | Status: DC

## 2017-12-09 MED ORDER — INSULIN ASPART 100 UNIT/ML ~~LOC~~ SOLN
0.0000 [IU] | SUBCUTANEOUS | Status: DC
  Administered 2017-12-10 (×2): 2 [IU] via SUBCUTANEOUS
  Administered 2017-12-11 (×2): 1 [IU] via SUBCUTANEOUS
  Administered 2017-12-11: 2 [IU] via SUBCUTANEOUS
  Administered 2017-12-11: 1 [IU] via SUBCUTANEOUS
  Administered 2017-12-11 – 2017-12-12 (×2): 2 [IU] via SUBCUTANEOUS
  Administered 2017-12-12 (×2): 1 [IU] via SUBCUTANEOUS
  Administered 2017-12-13: 2 [IU] via SUBCUTANEOUS
  Administered 2017-12-13 (×2): 1 [IU] via SUBCUTANEOUS
  Administered 2017-12-13: 2 [IU] via SUBCUTANEOUS
  Administered 2017-12-14: 1 [IU] via SUBCUTANEOUS
  Administered 2017-12-14: 2 [IU] via SUBCUTANEOUS
  Administered 2017-12-14: 1 [IU] via SUBCUTANEOUS
  Administered 2017-12-15: 2 [IU] via SUBCUTANEOUS
  Administered 2017-12-15 (×2): 1 [IU] via SUBCUTANEOUS

## 2017-12-09 MED ORDER — ATORVASTATIN CALCIUM 80 MG PO TABS
80.0000 mg | ORAL_TABLET | Freq: Every day | ORAL | Status: DC
  Administered 2017-12-09 – 2017-12-24 (×16): 80 mg via ORAL
  Filled 2017-12-09 (×16): qty 1

## 2017-12-09 MED ORDER — POLYETHYLENE GLYCOL 3350 17 G PO PACK
17.0000 g | PACK | Freq: Every day | ORAL | Status: DC
  Administered 2017-12-10 – 2017-12-25 (×12): 17 g via ORAL
  Filled 2017-12-09 (×15): qty 1

## 2017-12-09 MED ORDER — METOPROLOL TARTRATE 12.5 MG HALF TABLET
12.5000 mg | ORAL_TABLET | Freq: Two times a day (BID) | ORAL | Status: DC
  Administered 2017-12-09 – 2017-12-10 (×2): 12.5 mg via ORAL
  Filled 2017-12-09 (×2): qty 1

## 2017-12-09 MED ORDER — SODIUM CHLORIDE 0.9 % IV SOLN
INTRAVENOUS | Status: DC
  Administered 2017-12-09 – 2017-12-13 (×5): via INTRAVENOUS

## 2017-12-09 NOTE — Progress Notes (Signed)
Ortho Note:  I have been made aware of this patient and their hip fracture.  Will need to know when it is safe for surgery from cardiology standpoint, regarding fitness for surgery and when safely off anticoagulants for open hip procedure.   Please order new xrays of the injured hip, as I have not had access to any imaging.   Full consult note to follow.
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
## 2017-12-10 DIAGNOSIS — S72001A Fracture of unspecified part of neck of right femur, initial encounter for closed fracture: Principal | ICD-10-CM

## 2017-12-10 DIAGNOSIS — I213 ST elevation (STEMI) myocardial infarction of unspecified site: Secondary | ICD-10-CM

## 2017-12-10 DIAGNOSIS — R262 Difficulty in walking, not elsewhere classified: Secondary | ICD-10-CM

## 2017-12-10 DIAGNOSIS — Z01810 Encounter for preprocedural cardiovascular examination: Secondary | ICD-10-CM

## 2017-12-10 DIAGNOSIS — I10 Essential (primary) hypertension: Secondary | ICD-10-CM

## 2017-12-10 DIAGNOSIS — Z419 Encounter for procedure for purposes other than remedying health state, unspecified: Secondary | ICD-10-CM

## 2017-12-10 DIAGNOSIS — E782 Mixed hyperlipidemia: Secondary | ICD-10-CM

## 2017-12-10 LAB — CBC
HEMATOCRIT: 35.1 % — AB (ref 39.0–52.0)
HEMOGLOBIN: 11.6 g/dL — AB (ref 13.0–17.0)
MCH: 29.1 pg (ref 26.0–34.0)
MCHC: 33 g/dL (ref 30.0–36.0)
MCV: 88.2 fL (ref 78.0–100.0)
Platelets: 244 10*3/uL (ref 150–400)
RBC: 3.98 MIL/uL — ABNORMAL LOW (ref 4.22–5.81)
RDW: 13.6 % (ref 11.5–15.5)
WBC: 11.7 10*3/uL — ABNORMAL HIGH (ref 4.0–10.5)

## 2017-12-10 LAB — BASIC METABOLIC PANEL
Anion gap: 10 (ref 5–15)
BUN: 20 mg/dL (ref 6–20)
CHLORIDE: 102 mmol/L (ref 101–111)
CO2: 24 mmol/L (ref 22–32)
CREATININE: 1.3 mg/dL — AB (ref 0.61–1.24)
Calcium: 8.2 mg/dL — ABNORMAL LOW (ref 8.9–10.3)
GFR calc Af Amer: >60 mL/min (ref >60)
GFR calc non Af Amer: 54 mL/min — ABNORMAL LOW (ref >60)
GLUCOSE: 160 mg/dL — AB (ref 65–99)
POTASSIUM: 4 mmol/L (ref 3.5–5.1)
SODIUM: 136 mmol/L (ref 135–145)

## 2017-12-10 LAB — GLUCOSE, CAPILLARY
GLUCOSE-CAPILLARY: 174 mg/dL — AB (ref 65–99)
GLUCOSE-CAPILLARY: 160 mg/dL — AB (ref 65–99)
Glucose-Capillary: 180 mg/dL — ABNORMAL HIGH (ref 65–99)
Glucose-Capillary: 179 mg/dL — ABNORMAL HIGH (ref 65–99)

## 2017-12-10 LAB — HEPARIN LEVEL (UNFRACTIONATED)

## 2017-12-10 LAB — PLATELET INHIBITION P2Y12: Platelet Function  P2Y12: 58 [PRU] — ABNORMAL LOW (ref 194–418)

## 2017-12-10 MED ORDER — ASPIRIN EC 81 MG PO TBEC
81.0000 mg | DELAYED_RELEASE_TABLET | Freq: Every day | ORAL | Status: DC
  Administered 2017-12-10 – 2017-12-21 (×12): 81 mg via ORAL
  Filled 2017-12-10 (×12): qty 1

## 2017-12-10 MED ORDER — METOPROLOL TARTRATE 25 MG PO TABS
25.0000 mg | ORAL_TABLET | Freq: Two times a day (BID) | ORAL | Status: DC
  Administered 2017-12-10 – 2017-12-16 (×12): 25 mg via ORAL
  Filled 2017-12-10 (×13): qty 1

## 2017-12-10 MED ORDER — HEPARIN SODIUM (PORCINE) 5000 UNIT/ML IJ SOLN: 5000.0000 [IU] | Freq: Three times a day (TID) | INTRAMUSCULAR | Status: DC

## 2017-12-10 MED ORDER — HEPARIN (PORCINE) IN NACL 100-0.45 UNIT/ML-% IJ SOLN
2000.0000 [IU]/h | INTRAMUSCULAR | Status: DC
Start: 2017-12-10 — End: 2017-12-15
  Administered 2017-12-10: 1300 [IU]/h via INTRAVENOUS
  Administered 2017-12-11: 1550 [IU]/h via INTRAVENOUS
  Administered 2017-12-11 – 2017-12-14 (×5): 2000 [IU]/h via INTRAVENOUS
  Filled 2017-12-10 (×9): qty 250

## 2017-12-10 NOTE — Consult Note (Addendum)
Cardiology Consultation:   Patient ID: Dennis [name]; [id number]; 06/07/1946   Admit date: 12/09/2017 Date of Consult: 12/10/2017  Primary Care Provider: Glenda Chroman, MD Primary Cardiologist: [name]   Patient Profile:   Dennis [name] is a 71 y.o. male with a hx of CAD, s/p recent PCI who is being seen today for pre op cardiac clearance at the request of Dr Stann Mainland.  History of Present Illness:   Dennis [name] is a 73 y/o male from [place] with history of CAD status post stenting to the RCA and circumflex in 2006, DM, hypertension, HLD, tobacco abuse. The pt presented 11/13/17 with a STEMI. He was treated with a DES to the RCA ( for ISR) and a PCI with DES to the PDA. The pt had residual 60% mid LAD, 90% Dx1, 70% OM1 and 40 % ISR of the OM 2. The pt had normal LVEF on LV gram. Troponin peaked at greater than 65, hemoglobin A1c was 8.0.  Blood pressure and HR low so could not add beta-blocker or ARB then. He was seen in follow up 12/04/17 and low dose beta blocker added.   He apparently fell at home 12/08/17 and fractured his right hip. He was transferred from [facility] 12/09/17 for further evaluation. The pt says his last dose of ASA was 3/21 and his last dose of Brilinta was 3/22. He denies chest pain. He was n a good deal of discomfort when I examined him this am, just back from CT.    Past Medical History:  Diagnosis Date  . CAD (coronary artery disease)    2/19 PCI/DESx mRCA, and PDA x1. Normal EF.   Marland Kitchen Chronic pain   . Diabetes mellitus ([place])   . Hyperlipidemia   . Hypertension   . STEMI (ST elevation myocardial infarction) ([place])   . Tobacco use     Past Surgical History:  Procedure Laterality Date  . CORONARY STENT INTERVENTION N/A 11/13/2017   Procedure: CORONARY STENT INTERVENTION;  Surgeon: Martinique, Peter M, MD;  Location: [place] [facility];  Service: Cardiovascular;  Laterality: N/A;  . CORONARY/GRAFT ACUTE MI REVASCULARIZATION N/A 11/13/2017   Procedure:  Coronary/Graft Acute MI Revascularization;  Surgeon: Martinique, Peter M, MD;  Location: [place];  Service: Cardiovascular;  Laterality: N/A;  . LEFT HEART CATH AND CORONARY ANGIOGRAPHY N/A 11/13/2017   Procedure: LEFT HEART CATH AND CORONARY ANGIOGRAPHY;  Surgeon: Martinique, Peter M, MD;  Location: [facility];  Service: Cardiovascular;  Laterality: N/A;     Home Medications:  Prior to Admission medications   Medication Sig Start Date End Date Taking? Authorizing Provider  aspirin EC 81 MG EC tablet Take 1 tablet (81 mg total) by mouth daily. 11/15/17  Yes Cheryln Manly, NP  atorvastatin (LIPITOR) 80 MG tablet Take 1 tablet (80 mg total) by mouth daily at 6 PM. 11/15/17  Yes Cheryln Manly, NP  diazepam (VALIUM) 10 MG tablet Take 10 mg by mouth at bedtime as needed for sleep or anxiety.  10/17/17  Yes [provider]  diclofenac sodium (VOLTAREN) 1 % GEL Apply 1 application topically 4 (four) times daily as needed for pain. 10/17/17  Yes [provider]  HYDROcodone-acetaminophen (NORCO/VICODIN) 5-325 MG tablet Take 1 tablet by mouth every 6 (six) hours as needed (pain/headache).  11/21/17  Yes [provider]  Insulin Degludec (TRESIBA FLEXTOUCH) 200 UNIT/ML SOPN Inject 50 Units into the skin daily after supper.    Yes [provider]  lisinopril (PRINIVIL,ZESTRIL) 5 MG tablet Take 5 mg by mouth daily.   Yes [provider]  metFORMIN (GLUCOPHAGE) 850 MG tablet Take 850 mg by mouth daily with breakfast. 12/08/17  Yes [provider]  metoprolol tartrate (LOPRESSOR) 25 MG tablet Take 0.5 tablets (12.5 mg total) by mouth 2 (two) times daily. 12/04/17 03/04/18 Yes Imogene Burn, PA-C  nitroGLYCERIN (NITROSTAT) 0.4 MG SL tablet Place 1 tablet (0.4 mg total) under the tongue every 5 (five) minutes x 3 doses as needed for chest pain. 11/15/17  Yes Cheryln Manly, NP  ticagrelor (BRILINTA) 90 MG TABS tablet Take 1 tablet (90 mg total)  by mouth 2 (two) times daily. 11/15/17  Yes Cheryln Manly, NP    Inpatient Medications: Scheduled Meds: . atorvastatin  80 mg Oral q1800  . insulin aspart  0-9 Units Subcutaneous Q4H  . lisinopril  5 mg Oral Daily  . metoprolol tartrate  12.5 mg Oral BID  . polyethylene glycol  17 g Oral Daily  . traMADol  50 mg Oral Q12H   Continuous Infusions: . sodium chloride 50 mL/hr at 12/09/17 2121   PRN Meds: diazepam, HYDROmorphone (DILAUDID) injection, ondansetron (ZOFRAN) IV  Allergies:    Allergies  Allergen Reactions  . Fish Allergy Swelling    ALL fish  . Gabapentin Other (See Comments)    Unknown reaction  . Tamsulosin Other (See Comments)    Unknown reaction  . Betadine [Povidone Iodine] Swelling  . Iodine Swelling  . Shellfish Allergy Swelling    Facial swelling  . Sulfamethoxazole-Trimethoprim Swelling    Facial swelling  . Trimethoprim Swelling    Facial swelling  . Tramadol Other (See Comments)    Makes him crazy    Social History:   Social History   Socioeconomic History  . Marital status: Married    Spouse name: Not on file  . Number of children: Not on file  . Years of education: Not on file  . Highest education level: Not on file  Occupational History  . Not on file  Social Needs  . Financial resource strain: Not on file  . Food insecurity:    Worry: Not on file    Inability: Not on file  . Transportation needs:    Medical: Not on file    Non-medical: Not on file  Tobacco Use  . Smoking status: Former Smoker    Types: Cigarettes    Start date: 10/25/2017  . Smokeless tobacco: Never Used  Substance and Sexual Activity  . Alcohol use: No    Frequency: Never  . Drug use: No  . Sexual activity: Not on file  Lifestyle  . Physical activity:    Days per week: Not on file    Minutes per session: Not on file  . Stress: Not on file  Relationships  . Social connections:    Talks on phone: Not on file    Gets together: Not on file    Attends  religious service: Not on file    Active member of club or organization: Not on file    Attends meetings of clubs or organizations: Not on file    Relationship status: Not on file  . Intimate partner violence:    Fear of current or ex partner: Not on file    Emotionally abused: Not on file    Physically abused: Not on file    Forced sexual activity: Not on file  Other Topics Concern  . Not on file  Social History Narrative  . Not on file    Family History:    Family History  Problem Relation Age of Onset  . Hypertension Father   . Heart disease Father      ROS:  Please see the history of present illness.  All other ROS reviewed and negative.     Physical Exam/Data:   Vitals:   12/09/17 1644 12/09/17 2037  BP: 140/62 (!) 149/73  Pulse: 91 (!) 102  Resp: 19 18  Temp: 97.9 F (36.6 C) 97.7 F (36.5 C)  TempSrc: Oral Oral  SpO2: 97%   Weight: 200 lb 1.6 oz (90.8 kg)   Height: 6' (1.829 m)     Intake/Output Summary (Last 24 hours) at 12/10/2017 0918 Last data filed at 12/10/2017 0648 Gross per 24 hour  Intake 350 ml  Output -  Net 350 ml   Filed Weights   12/09/17 1644  Weight: 200 lb 1.6 oz (90.8 kg)   Body mass index is 27.14 kg/m.  General:  Well nourished, well developed, in no acute distress but uncomfortable secondary to hip pain HEENT: normal Lymph: no adenopathy Neck: no JVD Endocrine:  No thryomegaly Vascular: No carotid bruits; FA pulses 2+ bilaterally without bruits  Cardiac:  normal S1, S2; RRR; no murmur  Lungs:  clear to auscultation bilaterally, no wheezing, rhonchi or rales  Abd: soft, nontender, no hepatomegaly  Ext: no edema Musculoskeletal:  No deformities, BUE and BLE strength normal and equal Skin: pale, warm and dry  Neuro:  CNs 2-12 intact, no focal abnormalities noted Psych:  Normal affect   EKG:  The EKG was personally reviewed and demonstrates:  NSR, inferior Qs Telemetry:  Telemetry was personally reviewed and demonstrates:   NSR, PVCs  Relevant CV Studies: Echo 11/14/17- Study Conclusions  - Left ventricle: The cavity size was normal. Systolic function was   normal. The estimated ejection fraction was in the range of 55%   to 60%. Possible mild hypokinesis of the basal-midinferior   myocardium. - Left atrium: The atrium was mildly dilated.  Cath 11/13/17- Dennis [name]  CARDIAC CATHETERIZATION    Diagnostic Diagram       Post-Intervention Diagram       Implants    Permanent Stent   Laboratory Data:  Chemistry Recent Labs  Lab 12/09/17 1813 12/10/17 0520  NA 136 136  K 3.9 4.0  CL 104 102  CO2 22 24  GLUCOSE 105* 160*  BUN 18 20  CREATININE 1.25* 1.30*  CALCIUM 8.3* 8.2*  GFRNONAA 57* 54*  GFRAA >60 >60  ANIONGAP 10 10    Recent Labs  Lab 12/09/17 1813  PROT 6.6  ALBUMIN 2.9*  AST 25  ALT 19  ALKPHOS 86  BILITOT 1.1   Hematology Recent Labs  Lab 12/09/17 1813 12/10/17 0520  WBC 11.8* 11.7*  RBC 4.20* 3.98*  HGB 12.3* 11.6*  HCT 37.1* 35.1*  MCV 88.3 88.2  MCH 29.3 29.1  MCHC 33.2 33.0  RDW 13.9 13.6  PLT 246 244    Radiology/Studies:  Dg Chest 1 View  Result Date: 12/09/2017 CLINICAL DATA:  Right hip fracture EXAM: CHEST  1 VIEW COMPARISON:  11/13/2017 FINDINGS: Cardiac shadow is within normal limits. The lungs are well aerated bilaterally. No acute infiltrate is seen. No bony abnormality is noted. IMPRESSION: No acute abnormality seen. Electronically Signed   By: Inez Catalina M.D.   On: 12/09/2017 19:22   Ct Hip Right Wo Contrast  Result Date: 12/10/2017  CLINICAL DATA:  Right femoral neck fracture. EXAM: CT OF THE RIGHT HIP WITHOUT CONTRAST TECHNIQUE: Multidetector CT imaging of the right hip was performed according to the standard protocol. Multiplanar CT image reconstructions were also generated. COMPARISON:  None. FINDINGS: Bones/Joint/Cartilage Acute, closed proximal femoral neck fracture with varus and dorsal angulation of the femoral shaft as well  as slight impaction of the femoral neck on femoral head, series 3/74 and series 7/58. Degenerative joint space narrowing of the right hip. No joint dislocation. Osteoarthritis of the included SI joint. Small bone island of the right sacral ala. Ligaments Suboptimally assessed by CT. Muscles and Tendons No intramuscular hemorrhage. Soft tissues Mild posttraumatic soft tissue edema of the right hip. IMPRESSION: Acute, closed, proximal right femoral neck fracture with varus and dorsal angulation of the distal fracture fragment. Slight impaction of the femoral neck on the femoral head. Electronically Signed   By: Ashley Royalty M.D.   On: 12/10/2017 02:20   Dg Hip Unilat With Pelvis 2-3 Views Right  Result Date: 12/09/2017 CLINICAL DATA:  Groin pain after a fall. EXAM: DG HIP (WITH OR WITHOUT PELVIS) 2-3V RIGHT COMPARISON:  None. FINDINGS: Bones are diffusely demineralized. Cross-table lateral film limited by technique. Transcervical right femoral neck fracture evident with varus angulation. Symphysis pubis and SI joints are unremarkable. IMPRESSION: Right femoral neck fracture with varus angulation. Electronically Signed   By: Misty Stanley M.D.   On: 12/09/2017 19:21    Assessment and Plan:   Pre operative cardiac clearance-fx hip Will review DAPT with MD and make recommendations. From a cardiac standpoint he is at increased, but acceptable risk for surgery (as it's non elective) secondary to recent MI and residual CAD. We will follow with you  CAD- S/p recent STEMI with two vessel PCI/ DES 11/14/27. Normal LVF by echo. Post MI he was a little hypotensive and bradycardic, not now. Will adjust medications, increase Lopressor to 25 mg BID, continue ACE and statin.  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   For questions or updates, please contact [place] Please consult [email or web address] for  contact info under Cardiology/STEMI.   Signed, Kerin Ransom, PA-C  12/10/2017 9:18 AM   The patient was seen, examined and discussed with Kerin Ransom, PA-C and I agree with the above.   72 y.o. male with a hx of CAD, s/p recent PCI for a STEMI on 11/13/17 by Dr Martinique, who is being seen today for pre op cardiac clearance at the request of Dr Stann Mainland. He has h/o CAD, s/p stenting to the RCA and circumflex in 2006, DM, hypertension, HLD, tobacco abuse. The pt presented 11/13/17 with a STEMI. He was treated with a DES to the RCA ( for ISR) and a PCI with DES to the PDA. The pt had residual 60% mid LAD, 90% Dx1, 70% OM1 and 40 % ISR of the OM 2. The pt had normal LVEF on LV gram. Troponin peaked at greater than 65, hemoglobin A1c was 8.0.  Blood pressure and HR low so could not add beta-blocker or ARB then.   He broke his hip on 12/08/17, he was transferred from [facility] 12/09/17 as their cardiologist refused to evaluate for preop (? Ethics committee). They stopped his ASA and Brilinta yesterday. Per ortho surgeon here they wont the patient to be 5 days of Brilinta to consider surgery.  Assessment/Recommendations: - the patient has  no signs of angina or CHF and can undergo hip replacement - restart ASA 81 mg po ASAP - hold Brilinta - start iv Heparin until surgery - restart Brilinta as soon as possible post surgery - continue metoprolol in the perioperative period - we will obtain P2Y12 to see if the surgery can be performed earlier as the patient is in lot of pain - pain management   Please call us with any questions.  Ena Dawley, MD

## 2017-12-10 NOTE — Progress Notes (Signed)
ANTICOAGULATION CONSULT NOTE - Initial Consult  Pharmacy Consult for heparin Indication: Recent STEMI with DES placed, bridging while off DAPT for hip surgery s/p hip fx  Patient Measurements: Height: 6' (182.9 cm) Weight: 200 lb 1.6 oz (90.8 kg) IBW/kg (Calculated) : 77.6 Heparin Dosing Weight: 90.8 kg  Labs: Recent Labs    12/09/17 1813 12/10/17 0520 12/10/17 1935  HGB 12.3* 11.6*  --   HCT 37.1* 35.1*  --   PLT 246 244  --   HEPARINUNFRC  --   --  <0.10*  CREATININE 1.25* 1.30*  --    Estimated Creatinine Clearance: 58 mL/min (A) (by C-G formula based on SCr of 1.3 mg/dL (H)).  Medical History: Past Medical History:  Diagnosis Date  . CAD (coronary artery disease)    2/19 PCI/DESx mRCA, and PDA x1. Normal EF.   Marland Kitchen Chronic pain   . Diabetes mellitus ([place])   . Hyperlipidemia   . Hypertension   . STEMI (ST elevation myocardial infarction) ([place])   . Tobacco use    Assessment: Dennis [name] is a 72 y/o [name] presenting to [facility] from [facility] after a fall resulting in a hip fracture. Cardiology is following for cardiac clearance prior to surgery as he recently had a DES placed on 11/13/17 after presenting with a STEMI. Now off DAPT, last Brilinta dose 3/22. Heparin is being used to bridge to surgery while off DAPT due to occlusion risk.  Initial heparin level came back undetectable (<0.1), on 1300 units/hr. Hgb 11.6, PLTs wnl, no bleeding noted and no infusion issues per nursing.  Goal of Therapy:  Heparin level 0.3-0.7 units/ml Monitor platelets by anticoagulation protocol: Yes   Plan:  Increase heparin infusion to 1550 units/hr Heparin level in 8 hrs Monitor daily heparin level, CBC, and for signs/symptoms of bleeding  Dennis [name], PharmD Clinical Pharmacist  Pager: [phone] Clinical Phone for 12/10/2017 until 3:30pm: [phone] If after 3:30pm, please call main pharmacy at [phone]

## 2017-12-10 NOTE — Progress Notes (Addendum)
ANTICOAGULATION CONSULT NOTE - Initial Consult  Pharmacy Consult for heparin Indication: Recent STEMI with DES placed, bridging while off DAPT for hip surgery s/p hip fx  Patient Measurements: Height: 6' (182.9 cm) Weight: 200 lb 1.6 oz (90.8 kg) IBW/kg (Calculated) : 77.6 Heparin Dosing Weight: 90.8 kg  Labs: Recent Labs    12/09/17 1813 12/10/17 0520  HGB 12.3* 11.6*  HCT 37.1* 35.1*  PLT 246 244  CREATININE 1.25* 1.30*   Estimated Creatinine Clearance: 58 mL/min (A) (by C-G formula based on SCr of 1.3 mg/dL (H)).  Medical History: Past Medical History:  Diagnosis Date  . CAD (coronary artery disease)    2/19 PCI/DESx mRCA, and PDA x1. Normal EF.   Marland Kitchen Chronic pain   . Diabetes mellitus ([place])   . Hyperlipidemia   . Hypertension   . STEMI (ST elevation myocardial infarction) ([place])   . Tobacco use    Assessment: Dennis [name] is a 72 y/o M presenting to [facility] from [facility] after a fall resulting in a hip fracture. Cardiology is following for cardiac clearance prior to surgery as he recently had a DES placed on 11/13/17 after presenting with a STEMI. Now off DAPT, last Brilinta dose 3/22. Heparin is being used to bridge to surgery while off DAPT due to occlusion risk.  Hgb 11.6, PLTs wnl, no bleeding noted and no anticoagulation PTA. No bolus being used since no acute concern for full anticoagulation and with hip fracture.   Goal of Therapy:  Heparin level 0.3-0.7 units/ml Monitor platelets by anticoagulation protocol: Yes   Plan:  Start Heparin drip at 1300 units/hr Heparin level in [place] PharmD PGY1 Pharmacy Practice Resident 12/10/2017 10:18 AM Pager: [phone] Phone: [phone]
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
## 2017-12-10 NOTE — Plan of Care (Signed)
  Problem: Pain Managment: Goal: General experience of comfort will improve Outcome: Progressing Note:  Pain management discussed with pt.   

## 2017-12-11 DIAGNOSIS — I25811 Atherosclerosis of native coronary artery of transplanted heart without angina pectoris: Secondary | ICD-10-CM

## 2017-12-11 DIAGNOSIS — E785 Hyperlipidemia, unspecified: Secondary | ICD-10-CM

## 2017-12-11 DIAGNOSIS — E1169 Type 2 diabetes mellitus with other specified complication: Secondary | ICD-10-CM

## 2017-12-11 LAB — CBC
HEMATOCRIT: 32.5 % — AB (ref 39.0–52.0)
HEMOGLOBIN: 10.9 g/dL — AB (ref 13.0–17.0)
MCH: 29.1 pg (ref 26.0–34.0)
MCHC: 33.5 g/dL (ref 30.0–36.0)
MCV: 86.9 fL (ref 78.0–100.0)
Platelets: 230 10*3/uL (ref 150–400)
RBC: 3.74 MIL/uL — AB (ref 4.22–5.81)
RDW: 13.5 % (ref 11.5–15.5)
WBC: 12.3 10*3/uL — ABNORMAL HIGH (ref 4.0–10.5)

## 2017-12-11 LAB — GLUCOSE, CAPILLARY
GLUCOSE-CAPILLARY: 147 mg/dL — AB (ref 65–99)
GLUCOSE-CAPILLARY: 160 mg/dL — AB (ref 65–99)
Glucose-Capillary: 140 mg/dL — ABNORMAL HIGH (ref 65–99)
Glucose-Capillary: 127 mg/dL — ABNORMAL HIGH (ref 65–99)
Glucose-Capillary: 170 mg/dL — ABNORMAL HIGH (ref 65–99)

## 2017-12-11 LAB — HEPARIN LEVEL (UNFRACTIONATED)
HEPARIN UNFRACTIONATED: 0.14 [IU]/mL — AB (ref 0.30–0.70)
Heparin Unfractionated: 0.21 IU/mL — ABNORMAL LOW (ref 0.30–0.70)

## 2017-12-11 MED ORDER — ACETAMINOPHEN 325 MG PO TABS
650.0000 mg | ORAL_TABLET | Freq: Four times a day (QID) | ORAL | Status: DC | PRN
  Administered 2017-12-11 – 2017-12-19 (×6): 650 mg via ORAL
  Filled 2017-12-11 (×7): qty 2

## 2017-12-11 MED ORDER — DIPHENHYDRAMINE HCL 50 MG/ML IJ SOLN
12.5000 mg | Freq: Once | INTRAMUSCULAR | Status: AC
  Administered 2017-12-11: 12.5 mg via INTRAVENOUS
  Filled 2017-12-11: qty 1

## 2017-12-11 MED ORDER — KETOROLAC TROMETHAMINE 15 MG/ML IJ SOLN
15.0000 mg | Freq: Once | INTRAMUSCULAR | Status: AC
  Administered 2017-12-11: 15 mg via INTRAVENOUS
  Filled 2017-12-11: qty 1

## 2017-12-11 MED ORDER — METOCLOPRAMIDE HCL 5 MG/ML IJ SOLN
5.0000 mg | Freq: Once | INTRAMUSCULAR | Status: AC
  Administered 2017-12-11: 5 mg via INTRAVENOUS
  Filled 2017-12-11: qty 2

## 2017-12-11 NOTE — Progress Notes (Signed)
ANTICOAGULATION CONSULT NOTE - Follow Up Consult  Pharmacy Consult for heparin Indication: CAD  Labs: Recent Labs    12/09/17 1813 12/10/17 0520 12/10/17 1935 12/11/17 0446  HGB 12.3* 11.6*  --  10.9*  HCT 37.1* 35.1*  --  32.5*  PLT 246 244  --  230  HEPARINUNFRC  --   --  <0.10* 0.14*  CREATININE 1.25* 1.30*  --   --     Assessment: 72yo male remains subtherapeutic on heparin after rate increase; no gtt issues or signs of bleeding per RN.  Goal of Therapy:  Heparin level 0.3-0.7 units/ml   Plan:  Will increase heparin gtt by ~3 units/kg/hr to 1800 units/hr and check level in 8 hours.    Wynona Neat, PharmD, BCPS  12/11/2017,5:38 AM

## 2017-12-11 NOTE — Progress Notes (Signed)
ANTICOAGULATION CONSULT NOTE - Initial Consult  Pharmacy Consult for heparin Indication: Recent STEMI with DES placed, bridging while off DAPT for hip surgery s/p hip fx  Patient Measurements: Height: 6' (182.9 cm) Weight: 200 lb 1.6 oz (90.8 kg) IBW/kg (Calculated) : 77.6 Heparin Dosing Weight: 90.8 kg  Labs: Recent Labs    12/09/17 1813 12/10/17 0520 12/10/17 1935 12/11/17 0446 12/11/17 1312  HGB 12.3* 11.6*  --  10.9*  --   HCT 37.1* 35.1*  --  32.5*  --   PLT 246 244  --  230  --   HEPARINUNFRC  --   --  <0.10* 0.14* 0.21*  CREATININE 1.25* 1.30*  --   --   --    Estimated Creatinine Clearance: 58 mL/min (A) (by C-G formula based on SCr of 1.3 mg/dL (H)).  Medical History: Past Medical History:  Diagnosis Date  . CAD (coronary artery disease)    2/19 PCI/DESx mRCA, and PDA x1. Normal EF.   Marland Kitchen Chronic pain   . Diabetes mellitus ([place])   . Hyperlipidemia   . Hypertension   . STEMI (ST elevation myocardial infarction) ([place])   . Tobacco use    Assessment: Dennis [name] is a 72 y/o M presenting to [facility] from [facility] after a fall resulting in a hip fracture. Cardiology is following for cardiac clearance prior to surgery as he recently had a DES placed on 11/13/17 after presenting with a STEMI. Now off DAPT, last Brilinta dose 3/22. Heparin is being used to bridge to surgery while off DAPT due to occlusion risk.  Heparin level still low this afternoon at (0.21), on 1800 units/hr. Hgb 10.9, PLTs wnl, no bleeding noted and no infusion issues per nursing.  P2y12 was 58 PRU - would like to have >240 PRU prior to surgery  Goal of Therapy:  Heparin level 0.3-0.7 units/ml Monitor platelets by anticoagulation protocol: Yes   Plan:  Increase heparin infusion to 2000 units/hr Heparin level in 8 hrs Monitor daily heparin level, CBC, and for signs/symptoms of bleeding  Erin Hearing PharmD., BCPS Clinical Pharmacist 12/11/2017 2:12 PM
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
## 2017-12-11 NOTE — Progress Notes (Addendum)
CARDIOLOGY FOLLOW-UP   2 stents placed to the right coronary artery in setting of ACS-STEMI less than 1 month ago.  At increased risk for acute ischemic complications, however risk is not prohibitive.  Currently on IV heparin since 12/08/17.  Agree with restarting aspirin (12/10/2017).  Continue IV heparin until on call for hip surgery. Resume dual antiplatelet therapy as soon as possible after the procedure.  Follow hemoglobin and platelet counts closely. (An alternative approach would have been Cangrelor - works like ticagrelor), discontinuing the medication 2-4 hours prior to surgery as offset of action is rapid.  Agree P2Y12 will be helpful but do not advise surgery until 12/13/17 or when P212 greater than 240 seconds (whichever occurs first).

## 2017-12-12 DIAGNOSIS — I2510 Atherosclerotic heart disease of native coronary artery without angina pectoris: Secondary | ICD-10-CM

## 2017-12-12 LAB — BASIC METABOLIC PANEL
ANION GAP: 9 (ref 5–15)
BUN: 19 mg/dL (ref 6–20)
CHLORIDE: 105 mmol/L (ref 101–111)
CO2: 23 mmol/L (ref 22–32)
Calcium: 8.3 mg/dL — ABNORMAL LOW (ref 8.9–10.3)
Creatinine, Ser: 1.08 mg/dL (ref 0.61–1.24)
GFR calc Af Amer: >60 mL/min (ref >60)
GFR calc non Af Amer: >60 mL/min (ref >60)
GLUCOSE: 133 mg/dL — AB (ref 65–99)
Potassium: 3.8 mmol/L (ref 3.5–5.1)
Sodium: 137 mmol/L (ref 135–145)

## 2017-12-12 LAB — HEPARIN LEVEL (UNFRACTIONATED)
HEPARIN UNFRACTIONATED: 0.37 [IU]/mL (ref 0.30–0.70)
Heparin Unfractionated: 0.42 IU/mL (ref 0.30–0.70)

## 2017-12-12 LAB — CBC
HEMATOCRIT: 33.9 % — AB (ref 39.0–52.0)
HEMOGLOBIN: 11.6 g/dL — AB (ref 13.0–17.0)
MCH: 29.2 pg (ref 26.0–34.0)
MCHC: 34.2 g/dL (ref 30.0–36.0)
MCV: 85.4 fL (ref 78.0–100.0)
Platelets: 247 10*3/uL (ref 150–400)
RBC: 3.97 MIL/uL — ABNORMAL LOW (ref 4.22–5.81)
RDW: 13.1 % (ref 11.5–15.5)
WBC: 10.5 10*3/uL (ref 4.0–10.5)

## 2017-12-12 LAB — GLUCOSE, CAPILLARY
GLUCOSE-CAPILLARY: 180 mg/dL — AB (ref 65–99)
Glucose-Capillary: 132 mg/dL — ABNORMAL HIGH (ref 65–99)
Glucose-Capillary: 139 mg/dL — ABNORMAL HIGH (ref 65–99)
Glucose-Capillary: 189 mg/dL — ABNORMAL HIGH (ref 65–99)

## 2017-12-12 LAB — PLATELET INHIBITION P2Y12: Platelet Function  P2Y12: 109 [PRU] — ABNORMAL LOW (ref 194–418)

## 2017-12-12 MED ORDER — NALOXONE HCL 0.4 MG/ML IJ SOLN: 0.4000 mg | INTRAMUSCULAR | Status: DC | PRN

## 2017-12-12 MED ORDER — SENNA 8.6 MG PO TABS
1.0000 | ORAL_TABLET | Freq: Every day | ORAL | Status: DC
  Administered 2017-12-12 – 2017-12-24 (×11): 8.6 mg via ORAL
  Filled 2017-12-12 (×13): qty 1

## 2017-12-12 MED ORDER — HYDROCODONE-ACETAMINOPHEN 5-325 MG PO TABS
1.0000 | ORAL_TABLET | Freq: Four times a day (QID) | ORAL | Status: DC | PRN
  Administered 2017-12-14 (×2): 1 via ORAL
  Filled 2017-12-12 (×3): qty 1

## 2017-12-12 MED ORDER — MORPHINE SULFATE (PF) 2 MG/ML IV SOLN
2.0000 mg | Freq: Once | INTRAVENOUS | Status: AC
  Administered 2017-12-12: 2 mg via INTRAVENOUS
  Filled 2017-12-12: qty 1

## 2017-12-12 MED ORDER — OXYCODONE HCL 5 MG PO TABS
5.0000 mg | ORAL_TABLET | ORAL | Status: DC | PRN
  Filled 2017-12-12: qty 1

## 2017-12-12 NOTE — Progress Notes (Signed)
Provider on call notified pt had foley catheter removed on previous shift and has not voided. Bladder distended and uncomfortable to palpation. Bladder scan >600. Orders received to replace foley for acute urinary retention.

## 2017-12-12 NOTE — Plan of Care (Signed)
  Problem: Elimination: Goal: Will not experience complications related to urinary retention Outcome: Not Progressing  Foley cath removed on previous shift-pt was unable to void. Bladder Scan >600.  16 Fr Foley placed per orders.   Problem: Pain Managment: Goal: General experience of comfort will improve Outcome: Not Progressing Pt is awaiting surgical repair for R Hip fracture, PRN Dilaudid per orders , Pt refusing all other pain medications at this time.

## 2017-12-12 NOTE — Progress Notes (Addendum)
The patient has been seen in conjunction with Harlan Stains, NP-C. All aspects of care have been considered and discussed. The patient has been personally interviewed, examined, and all clinical data has been reviewed.   Agree with contents of this note.  P2 Y 12 has been ordered today.  12/13/2017 will represent 5 days off dual antiplatelet therapy and is the guideline recommended.  Of discontinuation prior to cardiac surgery.  Hopefully surgery will be done tomorrow.  Hopefully P2 Y 12 will be congruent with the decision.  Progress Note  Patient Name: Dennis [name] Date of Encounter: 12/12/2017  Primary Cardiologist: Carlyle Dolly, MD  Subjective   Confused and agitated this morning. Got a dose of pain medication recently according to RN and makes him confused.   Inpatient Medications    Scheduled Meds: . aspirin EC  81 mg Oral Daily  . atorvastatin  80 mg Oral q1800  . insulin aspart  0-9 Units Subcutaneous Q4H  . lisinopril  5 mg Oral Daily  . metoprolol tartrate  25 mg Oral BID  . polyethylene glycol  17 g Oral Daily   Continuous Infusions: . sodium chloride 50 mL/hr at 12/12/17 0555  . heparin 2,000 Units/hr (12/12/17 0557)   PRN Meds: acetaminophen, diazepam, HYDROmorphone (DILAUDID) injection, ondansetron (ZOFRAN) IV   Vital Signs    Vitals:   12/11/17 2051 12/11/17 2331 12/12/17 0435 12/12/17 0746  BP: (!) 115/56 140/69 137/76 116/77  Pulse: 72 87 (!) 118 80  Resp: 20 18 20 20   Temp: 98.1 F (36.7 C) 98.8 F (37.1 C) 98.6 F (37 C) 98.8 F (37.1 C)  TempSrc: Axillary Axillary Axillary Oral  SpO2: 96% 98% 96% 93%  Weight:   203 lb 7.8 oz (92.3 kg)   Height:        Intake/Output Summary (Last 24 hours) at 12/12/2017 0914 Last data filed at 12/12/2017 0200 Gross per 24 hour  Intake 789.96 ml  Output 1100 ml  Net -310.04 ml   Filed Weights   12/09/17 1644 12/12/17 0435  Weight: 200 lb 1.6 oz (90.8 kg) 203 lb 7.8 oz (92.3 kg)    Telemetry    SR - Personally Reviewed  ECG    N/a - Personally Reviewed  Physical Exam   General: Well developed older W male appearing in no acute distress. Head: Normocephalic, atraumatic.  Neck: Supple, no JVD. Lungs:  Resp regular and unlabored, CTA. Heart: RRR, S1, S2, no S3, S4, or murmur; no rub. Abdomen: Soft, non-tender, non-distended with normoactive bowel sounds. Extremities: No clubbing, cyanosis, edema. Distal pedal pulses are 2+ bilaterally. Neuro: Alert but confused and agitated. Moves all extremities spontaneously. Psych: Normal affect.  Labs    Chemistry Recent Labs  Lab 12/09/17 1813 12/10/17 0520  NA 136 136  K 3.9 4.0  CL 104 102  CO2 22 24  GLUCOSE 105* 160*  BUN 18 20  CREATININE 1.25* 1.30*  CALCIUM 8.3* 8.2*  PROT 6.6  --   ALBUMIN 2.9*  --   AST 25  --   ALT 19  --   ALKPHOS 86  --   BILITOT 1.1  --   GFRNONAA 57* 54*  GFRAA >60 >60  ANIONGAP 10 10     Hematology Recent Labs  Lab 12/09/17 1813 12/10/17 0520 12/11/17 0446  WBC 11.8* 11.7* 12.3*  RBC 4.20* 3.98* 3.74*  HGB 12.3* 11.6* 10.9*  HCT 37.1* 35.1* 32.5*  MCV 88.3 88.2 86.9  MCH 29.3 29.1 29.1  MCHC 33.2 33.0 33.5  RDW 13.9 13.6 13.5  PLT 246 244 230    Cardiac EnzymesNo results for input(s): TROPONINI in the last 168 hours. No results for input(s): TROPIPOC in the last 168 hours.   BNPNo results for input(s): BNP, PROBNP in the last 168 hours.   DDimer No results for input(s): DDIMER in the last 168 hours.    Radiology    No results found.  Cardiac Studies   N/a   Patient Profile     72 y.o. male with PMH of CAD with recent PCI, DM, hypertension, HLD, tobacco abuse who presented with right hip fracture and seen for pre op clearance.   Assessment & Plan    1. Pre operative cardiac clearance-fx hip: planned for surgery in the am. From a cardiac standpoint he is at increased, but acceptable risk for surgery (as it's non elective) secondary to recent MI and residual  CAD. P2Y12 was 58 on 3/24. Will recheck today. Remains on IV heparin.   2. CAD: S/p recent STEMI with two vessel PCI/ DES 11/14/27. Normal LVF by echo.  - continue Lopressor to 25 mg BID, continue ACE and statin.  3. Type 2 NIDDM: On Glucophage prior to admission, now on SSI   4. CRI-3: SCr 1.3, GFR 54   Signed, Reino Bellis, NP  12/12/2017, 9:14 AM  Pager # [phone]   For questions or updates, please contact [place] Please consult [email or web address] for contact info under Cardiology/STEMI.

## 2017-12-12 NOTE — Progress Notes (Signed)
Pt with frank blood in foley cath. Emptied at approx 1130 am and currently approx 200 cc in bag. Abd is distended, pt visibly in pain yelling "I need to pee". Bladder scan for >700cc. Provider on call paged with information awaiting return call.

## 2017-12-12 NOTE — Progress Notes (Signed)
ANTICOAGULATION CONSULT NOTE - Follow Up Consult  Pharmacy Consult for heparin Indication: CAD  Labs: Recent Labs    12/09/17 1813 12/10/17 0520  12/11/17 0446 12/11/17 1312 12/11/17 2329 12/12/17 0414  HGB 12.3* 11.6*  --  10.9*  --   --   --   HCT 37.1* 35.1*  --  32.5*  --   --   --   PLT 246 244  --  230  --   --   --   HEPARINUNFRC  --   --    < > 0.14* 0.21* 0.37 0.42  CREATININE 1.25* 1.30*  --   --   --   --   --    < > = values in this interval not displayed.    Assessment/Plan:  72yo male therapeutic on heparin after rate changes. Will continue gtt at current rate and confirm stable with am labs 3/27.   Joselyn Glassman, PharmD, BCPS  12/12/2017,9:05 AM

## 2017-12-12 NOTE — Progress Notes (Signed)
ANTICOAGULATION CONSULT NOTE - Follow Up Consult  Pharmacy Consult for heparin Indication: CAD  Labs: Recent Labs    12/09/17 1813 12/10/17 0520  12/11/17 0446 12/11/17 1312 12/11/17 2329  HGB 12.3* 11.6*  --  10.9*  --   --   HCT 37.1* 35.1*  --  32.5*  --   --   PLT 246 244  --  230  --   --   HEPARINUNFRC  --   --    < > 0.14* 0.21* 0.37  CREATININE 1.25* 1.30*  --   --   --   --    < > = values in this interval not displayed.    Assessment/Plan:  72yo male therapeutic on heparin after rate changes. Will continue gtt at current rate and confirm stable with am labs.   Wynona Neat, PharmD, BCPS  12/12/2017,12:39 AM
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
## 2017-12-12 NOTE — Progress Notes (Signed)
Foley cath removed, approx 200 cc blood drained from penis. Foley replaced. Slow flowing frank blood, irrigated, and aspirated small clot-flow resumed, 900 cc urine/frank blood drained. Pt reports releif of abd pain. Provider notified and orders for Irrigation Q4 and PRN obtained. Pts wife updated per patients request. Will continue to monitor. Jessie Foot, RN

## 2017-12-13 ENCOUNTER — Inpatient Hospital Stay (HOSPITAL_COMMUNITY): Payer: Medicare HMO

## 2017-12-13 DIAGNOSIS — Z008 Encounter for other general examination: Secondary | ICD-10-CM

## 2017-12-13 DIAGNOSIS — Z87891 Personal history of nicotine dependence: Secondary | ICD-10-CM

## 2017-12-13 DIAGNOSIS — R319 Hematuria, unspecified: Secondary | ICD-10-CM

## 2017-12-13 DIAGNOSIS — R410 Disorientation, unspecified: Secondary | ICD-10-CM

## 2017-12-13 DIAGNOSIS — D72829 Elevated white blood cell count, unspecified: Secondary | ICD-10-CM

## 2017-12-13 LAB — BASIC METABOLIC PANEL
Anion gap: 12 (ref 5–15)
BUN: 18 mg/dL (ref 6–20)
CALCIUM: 8.5 mg/dL — AB (ref 8.9–10.3)
CHLORIDE: 103 mmol/L (ref 101–111)
CO2: 22 mmol/L (ref 22–32)
Creatinine, Ser: 1.23 mg/dL (ref 0.61–1.24)
GFR calc Af Amer: >60 mL/min (ref >60)
GFR calc non Af Amer: 58 mL/min — ABNORMAL LOW (ref >60)
Glucose, Bld: 163 mg/dL — ABNORMAL HIGH (ref 65–99)
Potassium: 4.1 mmol/L (ref 3.5–5.1)
Sodium: 137 mmol/L (ref 135–145)

## 2017-12-13 LAB — URINALYSIS, ROUTINE W REFLEX MICROSCOPIC
Bilirubin Urine: NEGATIVE
Glucose, UA: 50 mg/dL — AB
KETONES UR: 20 mg/dL — AB
Leukocytes, UA: NEGATIVE
Nitrite: NEGATIVE
PROTEIN: 100 mg/dL — AB
Specific Gravity, Urine: 1.015 (ref 1.005–1.030)
pH: 6 (ref 5.0–8.0)

## 2017-12-13 LAB — CBC
HEMATOCRIT: 31.2 % — AB (ref 39.0–52.0)
HEMOGLOBIN: 10.6 g/dL — AB (ref 13.0–17.0)
MCH: 28.9 pg (ref 26.0–34.0)
MCHC: 34 g/dL (ref 30.0–36.0)
MCV: 85 fL (ref 78.0–100.0)
Platelets: 265 10*3/uL (ref 150–400)
RBC: 3.67 MIL/uL — ABNORMAL LOW (ref 4.22–5.81)
RDW: 13.1 % (ref 11.5–15.5)
WBC: 13.3 10*3/uL — ABNORMAL HIGH (ref 4.0–10.5)

## 2017-12-13 LAB — HEPARIN LEVEL (UNFRACTIONATED): Heparin Unfractionated: 0.45 IU/mL (ref 0.30–0.70)

## 2017-12-13 LAB — GLUCOSE, CAPILLARY
GLUCOSE-CAPILLARY: 167 mg/dL — AB (ref 65–99)
Glucose-Capillary: 140 mg/dL — ABNORMAL HIGH (ref 65–99)
Glucose-Capillary: 160 mg/dL — ABNORMAL HIGH (ref 65–99)
Glucose-Capillary: 194 mg/dL — ABNORMAL HIGH (ref 65–99)

## 2017-12-13 MED ORDER — HALOPERIDOL LACTATE 5 MG/ML IJ SOLN: 5.0000 mg | Freq: Once | INTRAMUSCULAR | Status: DC

## 2017-12-13 MED ORDER — LORAZEPAM 2 MG/ML IJ SOLN
1.0000 mg | Freq: Once | INTRAMUSCULAR | Status: AC
Start: 2017-12-13 — End: 2017-12-13
  Administered 2017-12-13: 1 mg via INTRAVENOUS
  Filled 2017-12-13: qty 1

## 2017-12-13 MED ORDER — HALOPERIDOL LACTATE 5 MG/ML IJ SOLN
INTRAMUSCULAR | Status: AC
  Administered 2017-12-13: 5 mg via INTRAVENOUS
  Filled 2017-12-13: qty 1

## 2017-12-13 MED ORDER — HALOPERIDOL LACTATE 5 MG/ML IJ SOLN
5.0000 mg | Freq: Once | INTRAMUSCULAR | Status: AC
  Administered 2017-12-13: 5 mg via INTRAVENOUS

## 2017-12-13 MED ORDER — LORAZEPAM 2 MG/ML IJ SOLN: 2.0000 mg | Freq: Once | INTRAMUSCULAR | Status: DC

## 2017-12-13 MED ORDER — LORAZEPAM 2 MG/ML IJ SOLN: 1.0000 mg | Freq: Once | INTRAMUSCULAR | Status: DC

## 2017-12-13 MED ORDER — HYDROMORPHONE HCL 1 MG/ML IJ SOLN
1.0000 mg | Freq: Once | INTRAMUSCULAR | Status: AC
  Administered 2017-12-13: 1 mg via INTRAVENOUS
  Filled 2017-12-13: qty 1

## 2017-12-13 MED ORDER — HALOPERIDOL LACTATE 5 MG/ML IJ SOLN
5.0000 mg | Freq: Four times a day (QID) | INTRAMUSCULAR | Status: DC | PRN
Start: 2017-12-13 — End: 2017-12-22
  Administered 2017-12-15 – 2017-12-21 (×4): 5 mg via INTRAVENOUS
  Filled 2017-12-13 (×4): qty 1

## 2017-12-13 MED ORDER — HALOPERIDOL LACTATE 5 MG/ML IJ SOLN
5.0000 mg | Freq: Once | INTRAMUSCULAR | Status: AC
  Administered 2017-12-13: 5 mg via INTRAVENOUS
  Filled 2017-12-13: qty 1

## 2017-12-13 MED ORDER — SODIUM CHLORIDE 0.9 % IV SOLN
1.0000 g | INTRAVENOUS | Status: AC
  Administered 2017-12-13 – 2017-12-19 (×7): 1 g via INTRAVENOUS
  Filled 2017-12-13 (×7): qty 10

## 2017-12-13 NOTE — Progress Notes (Signed)
Dennis [name] has been scheduled for right total hip arthroplasty for management of his right hip fracture this Friday with my partner Dr. Leilani Able tech.  Please have the patient nothing by mouth at midnight Thursday night.

## 2017-12-13 NOTE — Progress Notes (Signed)
Event/Shift note. Pt was becoming increasingly  agitated earlier in shift, takes Valium at home for anxiety. Tylenol 650 given at 2235 for hip pain and Valium 10mg  given at 2249. Pt settled down and was redirectable for a short period of time, began seeing Lizards on the ceiling and wall. Pt became extremely agitated, pulling gown, IV, and telemetry wires off. The more staff attempted to replace, the more agitated he became. Pt screaming, swinging at staff, states we are trying to rob and kill him,pt states he wanted" to get is gun and blow my head off", attempting to scoot and pull self out of bed. Security paged to floor to assist for staff safety in application of restraints for patient safety per order. Patients wife was called by this RN to inform of patient behavior/need for restraint application and I questioned her regarding any alcohol or drug use at home which she denied. Rapid Response also called to room to assess patient. CIWA score calculated at 42. Order for Haldol received and given. Pt less agitated for approx 30 min then again became agressively pulling and trying to rip restraints off arms, yelling and cussing at staff. Provider again paged and up dated with orders received for 1 mg dilaudid x1 for pain and Ativan 1mg  for agitation. Medication given per orders and will monitor. Jessie Foot, RN

## 2017-12-13 NOTE — Progress Notes (Signed)
Cardiology Progress Note  Surgery planned for Friday which will be 7 days off Brilinta. P2Y12 does not need to be repeated.

## 2017-12-13 NOTE — Progress Notes (Signed)
Patient's wife visiting at this time.  When discussing her husband's fall at home, she informed me that he hit his head when he fell..  Patient was on dual anti-platelet therapy at the time, and has been on a Heparin drip since 3/24.  Notified Dr. Starla Link, since patient has not had a head CT this admission.

## 2017-12-13 NOTE — Consult Note (Signed)
[facility] Psychiatry Consult   Reason for Consult:  Hallucinations/extreme agitation Referring Physician:  Dr. Starla Link Patient Identification: Dennis [name] MRN:  [id number] Principal Diagnosis: Delirium Diagnosis:   Patient Active Problem List   Diagnosis Date Noted  . Closed right hip fracture ([place]) [S72.001A] 12/09/2017  . CAD (coronary artery disease), native coronary artery [I25.10] 12/04/2017  . Hypertension [I10] 12/04/2017  . Hyperlipidemia [E78.5] 11/15/2017  . STEMI involving oth coronary artery of inferior wall ([place]) [I21.19] 11/13/2017  . Tobacco abuse [Z72.0] 11/13/2017  . STEMI involving right coronary artery ([place]) [I21.11] 11/13/2017  . STEMI (ST elevation myocardial infarction) ([place]) [I21.3] 11/13/2017  . Diabetes mellitus due to underlying condition, uncontrolled ([place]) [E08.65] 04/07/2009    Total Time spent with patient: 1 hour  Subjective:   Dennis [name] is a 72 y.o. male patient admitted with right hip fracture.  HPI:   Per chart review, patient was transferred to [facility] for surgical hip repair after a mechanical fall. His hospital course has been complicated by intermittent confusion with increasing agitation mostly at night. He has required a total of 10 mg of Haldol overnight for agitation. He also received Ativan 1 mg. He is receiving Dilaudid 1 mg q 3 hours PRN for pain (3 doses in the past 24 hours).   On interview, Dennis [name] reports that he is in the hospital for a "special admission." He is unable to state that he was admitted to the hospital for a hip fracture.  He denies pain or other physical complaints.  He believes that the date is January 1950.  He denies a history of depression or anxiety.  He denies SI, HI or AVH.  He denies problems with sleep or appetite.  His wife is at bedside with his permission.  She reports that he was confused a couple years ago in the setting of Tramadol use.  She reports that yesterday he was trying to  get up out of bed and disoriented.  She believes that he is doing better today.  The sitter also agrees that he is doing better today. He was oriented x 3 earlier today.  Past Psychiatric History: Denies  Risk to Self:  None.  Denies SI. Risk to Others:  None.  Denies HI. Prior Inpatient Therapy:  Denies Prior Outpatient Therapy:  Denies  Past Medical History:  Past Medical History:  Diagnosis Date  . CAD (coronary artery disease)    2/19 PCI/DESx mRCA, and PDA x1. Normal EF.   Marland Kitchen Chronic pain   . Diabetes mellitus ([place])   . Hyperlipidemia   . Hypertension   . STEMI (ST elevation myocardial infarction) ([place])   . Tobacco use     Past Surgical History:  Procedure Laterality Date  . CORONARY STENT INTERVENTION N/A 11/13/2017   Procedure: CORONARY STENT INTERVENTION;  Surgeon: Martinique, Peter M, MD;  Location: [place] [facility];  Service: Cardiovascular;  Laterality: N/A;  . CORONARY/GRAFT ACUTE MI REVASCULARIZATION N/A 11/13/2017   Procedure: Coronary/Graft Acute MI Revascularization;  Surgeon: Martinique, Peter M, MD;  Location: [place] [facility];  Service: Cardiovascular;  Laterality: N/A;  . LEFT HEART CATH AND CORONARY ANGIOGRAPHY N/A 11/13/2017   Procedure: LEFT HEART CATH AND CORONARY ANGIOGRAPHY;  Surgeon: Martinique, Peter M, MD;  Location: [place] [facility];  Service: Cardiovascular;  Laterality: N/A;   Family History:  Family History  Problem Relation Age of Onset  . Hypertension Father   . Heart disease Father    Family  Psychiatric  History: Denies Social History:  Social History   Substance and Sexual Activity  Alcohol Use No  . Frequency: Never     Social History   Substance and Sexual Activity  Drug Use No    Social History   Socioeconomic History  . Marital status: Married    Spouse name: Not on file  . Number of children: Not on file  . Years of education: Not on file  . Highest education level: Not on file  Occupational History  . Not on file  Social  Needs  . Financial resource strain: Not on file  . Food insecurity:    Worry: Not on file    Inability: Not on file  . Transportation needs:    Medical: Not on file    Non-medical: Not on file  Tobacco Use  . Smoking status: Former Smoker    Types: Cigarettes    Start date: 10/25/2017  . Smokeless tobacco: Never Used  Substance and Sexual Activity  . Alcohol use: No    Frequency: Never  . Drug use: No  . Sexual activity: Not on file  Lifestyle  . Physical activity:    Days per week: Not on file    Minutes per session: Not on file  . Stress: Not on file  Relationships  . Social connections:    Talks on phone: Not on file    Gets together: Not on file    Attends religious service: Not on file    Active member of club or organization: Not on file    Attends meetings of clubs or organizations: Not on file    Relationship status: Not on file  Other Topics Concern  . Not on file  Social History Narrative  . Not on file   Additional Social History: Patient lives at home with his wife of 14 years.  He does not have kids.  He used to work in recovery parts.  He denies alcohol or illicit substance use.    Allergies:   Allergies  Allergen Reactions  . Fish Allergy Swelling    ALL fish  . Gabapentin Other (See Comments)    Unknown reaction  . Tamsulosin Other (See Comments)    Unknown reaction  . Betadine [Povidone Iodine] Swelling  . Iodine Swelling  . Shellfish Allergy Swelling    Facial swelling  . Sulfamethoxazole-Trimethoprim Swelling    Facial swelling  . Trimethoprim Swelling    Facial swelling  . Tramadol Other (See Comments)    Makes him crazy    Labs:  Results for orders placed or performed during the hospital encounter of 12/09/17 (from the past 48 hour(s))  Glucose, capillary     Status: Abnormal   Collection Time: 12/11/17  4:56 PM  Result Value Ref Range   Glucose-Capillary 160 (H) 65 - 99 mg/dL  Glucose, capillary     Status: Abnormal   Collection  Time: 12/11/17  9:21 PM  Result Value Ref Range   Glucose-Capillary 170 (H) 65 - 99 mg/dL  Heparin level (unfractionated)     Status: None   Collection Time: 12/11/17 11:29 PM  Result Value Ref Range   Heparin Unfractionated 0.37 0.30 - 0.70 IU/mL    Comment:        IF HEPARIN RESULTS ARE BELOW EXPECTED VALUES, AND PATIENT DOSAGE HAS BEEN CONFIRMED, SUGGEST FOLLOW UP TESTING OF ANTITHROMBIN III LEVELS. Performed at [facility], [place] [address], [place], [place] [address]   Heparin level (  unfractionated)     Status: None   Collection Time: 12/12/17  4:14 AM  Result Value Ref Range   Heparin Unfractionated 0.42 0.30 - 0.70 IU/mL    Comment:        IF HEPARIN RESULTS ARE BELOW EXPECTED VALUES, AND PATIENT DOSAGE HAS BEEN CONFIRMED, SUGGEST FOLLOW UP TESTING OF ANTITHROMBIN III LEVELS. Performed at [facility], [place] [address] [place], [place] [address]   Glucose, capillary     Status: Abnormal   Collection Time: 12/12/17  7:44 AM  Result Value Ref Range   Glucose-Capillary 132 (H) 65 - 99 mg/dL  Platelet inhibition p2y12 (Not at [facility])     Status: Abnormal   Collection Time: 12/12/17 10:01 AM  Result Value Ref Range   Platelet Function  P2Y12 109 (L) 194 - 418 PRU    Comment:        The literature has shown a direct correlation of PRU values over 230 with higher risks of thrombotic events.  Lower PRU values are associated with platelet inhibition. Performed at [facility], [place] [address], [place], [place] [address]   CBC     Status: Abnormal   Collection Time: 12/12/17 10:01 AM  Result Value Ref Range   WBC 10.5 4.0 - 10.5 K/uL   RBC 3.97 (L) 4.22 - 5.81 MIL/uL   Hemoglobin 11.6 (L) 13.0 - 17.0 g/dL   HCT 33.9 (L) 39.0 - 52.0 %   MCV 85.4 78.0 - 100.0 fL   MCH 29.2 26.0 - 34.0 pg   MCHC 34.2 30.0 - 36.0 g/dL   RDW 13.1 11.5 - 15.5 %   Platelets 247 150 - 400 K/uL    Comment: Performed at [facility], [place] [address] [place], [place]  [address]  Basic metabolic panel     Status: Abnormal   Collection Time: 12/12/17 10:01 AM  Result Value Ref Range   Sodium 137 135 - 145 mmol/L   Potassium 3.8 3.5 - 5.1 mmol/L   Chloride 105 101 - 111 mmol/L   CO2 23 22 - 32 mmol/L   Glucose, Bld 133 (H) 65 - 99 mg/dL   BUN 19 6 - 20 mg/dL   Creatinine, Ser 1.08 0.61 - 1.24 mg/dL   Calcium 8.3 (L) 8.9 - 10.3 mg/dL   GFR calc non Af Amer >60 >60 mL/min   GFR calc Af Amer >60 >60 mL/min    Comment: (NOTE) The eGFR has been calculated using the CKD EPI equation. This calculation has not been validated in all clinical situations. eGFR's persistently <60 mL/min signify possible Chronic Kidney Disease.    Anion gap 9 5 - 15    Comment: Performed at [facility] [address], [place], [place] [address]  Glucose, capillary     Status: Abnormal   Collection Time: 12/12/17 11:23 AM  Result Value Ref Range   Glucose-Capillary 139 (H) 65 - 99 mg/dL  Glucose, capillary     Status: Abnormal   Collection Time: 12/12/17  3:38 PM  Result Value Ref Range   Glucose-Capillary 189 (H) 65 - 99 mg/dL  Glucose, capillary     Status: Abnormal   Collection Time: 12/12/17  9:30 PM  Result Value Ref Range   Glucose-Capillary 180 (H) 65 - 99 mg/dL  Glucose, capillary     Status: Abnormal   Collection Time: 12/13/17 12:28 AM  Result Value Ref Range   Glucose-Capillary 194 (H) 65 - 99 mg/dL  Heparin level (unfractionated)     Status: None   Collection Time: 12/13/17  7:17 AM  Result Value Ref Range   Heparin Unfractionated 0.45 0.30 - 0.70 IU/mL    Comment:        IF HEPARIN RESULTS ARE BELOW EXPECTED VALUES, AND PATIENT DOSAGE HAS BEEN CONFIRMED, SUGGEST FOLLOW UP TESTING OF ANTITHROMBIN III LEVELS. Performed at [facility], [place] [address], [place], [place] [address]   CBC     Status: Abnormal   Collection Time: 12/13/17  7:17 AM  Result Value Ref Range   WBC 13.3 (H) 4.0 - 10.5 K/uL   RBC 3.67 (L) 4.22 - 5.81 MIL/uL    Hemoglobin 10.6 (L) 13.0 - 17.0 g/dL   HCT 31.2 (L) 39.0 - 52.0 %   MCV 85.0 78.0 - 100.0 fL   MCH 28.9 26.0 - 34.0 pg   MCHC 34.0 30.0 - 36.0 g/dL   RDW 13.1 11.5 - 15.5 %   Platelets 265 150 - 400 K/uL    Comment: Performed at [facility], [place] [address] [place], [place] [address]  Basic metabolic panel     Status: Abnormal   Collection Time: 12/13/17  7:17 AM  Result Value Ref Range   Sodium 137 135 - 145 mmol/L   Potassium 4.1 3.5 - 5.1 mmol/L   Chloride 103 101 - 111 mmol/L   CO2 22 22 - 32 mmol/L   Glucose, Bld 163 (H) 65 - 99 mg/dL   BUN 18 6 - 20 mg/dL   Creatinine, Ser 1.23 0.61 - 1.24 mg/dL   Calcium 8.5 (L) 8.9 - 10.3 mg/dL   GFR calc non Af Amer 58 (L) >60 mL/min   GFR calc Af Amer >60 >60 mL/min    Comment: (NOTE) The eGFR has been calculated using the CKD EPI equation. This calculation has not been validated in all clinical situations. eGFR's persistently <60 mL/min signify possible Chronic Kidney Disease.    Anion gap 12 5 - 15    Comment: Performed at [facility] [address], [place], [place] [address]  Glucose, capillary     Status: Abnormal   Collection Time: 12/13/17  8:06 AM  Result Value Ref Range   Glucose-Capillary 160 (H) 65 - 99 mg/dL  Glucose, capillary     Status: Abnormal   Collection Time: 12/13/17 11:12 AM  Result Value Ref Range   Glucose-Capillary 140 (H) 65 - 99 mg/dL    Current Facility-Administered Medications  Medication Dose Route Frequency Provider Last Rate Last Dose  . acetaminophen (TYLENOL) tablet 650 mg  650 mg Oral Q6H PRN Kayleen Memos, DO   650 mg at 12/12/17 2239  . aspirin EC tablet 81 mg  81 mg Oral Daily Erlene Quan, PA-C   81 mg at 12/13/17 1015  . atorvastatin (LIPITOR) tablet 80 mg  80 mg Oral q1800 Irene Pap N, DO   80 mg at 12/12/17 1643  . diazepam (VALIUM) tablet 10 mg  10 mg Oral Q12H PRN Irene Pap N, DO   10 mg at 12/12/17 2249  . haloperidol lactate (HALDOL) injection 5 mg  5 mg  Intravenous Once Schorr, Rhetta Mura, NP      . haloperidol lactate (HALDOL) injection 5 mg  5 mg Intravenous Q6H PRN Alekh, Kshitiz, MD      . heparin ADULT infusion 100 units/mL (25000 units/282m sodium chloride 0.45%)  2,000 Units/hr Intravenous Continuous WLyndee Leo RPH 20 mL/hr  at 12/13/17 0731 2,000 Units/hr at 12/13/17 0731  . HYDROcodone-acetaminophen (NORCO/VICODIN) 5-325 MG per tablet 1 tablet  1 tablet Oral Q6H PRN Irene Pap N, DO      . insulin aspart (novoLOG) injection 0-9 Units  0-9 Units Subcutaneous Q4H Irene Pap N, DO   2 Units at 12/13/17 1017  . lisinopril (PRINIVIL,ZESTRIL) tablet 5 mg  5 mg Oral Daily P[place], Archie Patten N, DO   5 mg at 12/13/17 1015  . metoprolol tartrate (LOPRESSOR) tablet 25 mg  25 mg Oral BID Erlene Quan, PA-C   25 mg at 12/13/17 1013  . naloxone [facility]) injection 0.4 mg  0.4 mg Intravenous PRN Irene Pap N, DO      . ondansetron (ZOFRAN) injection 4 mg  4 mg Intravenous Q6H PRN Hall, Carole N, DO      . polyethylene glycol (MIRALAX / GLYCOLAX) packet 17 g  17 g Oral Daily [place], Carole N, DO   17 g at 12/13/17 1015  . senna (SENOKOT) tablet 8.6 mg  1 tablet Oral QHS Irene Pap N, DO   8.6 mg at 12/12/17 2239    Musculoskeletal: Strength & Muscle Tone: decreased due to physical deconditioning. Gait & Station: UTA due to patient lying in bed. Patient leans: N/A  Psychiatric Specialty Exam: Physical Exam  Nursing note and vitals reviewed. Constitutional: He appears well-developed and well-nourished.  HENT:  Head: Normocephalic and atraumatic.  Neck: Normal range of motion.  Respiratory: Effort normal.  Musculoskeletal: Normal range of motion.  Neurological: He is alert.  Oriented to person only.  Skin: No rash noted.  Psychiatric: He has a normal mood and affect. His speech is normal and behavior is normal. Judgment and thought content normal. Cognition and memory are impaired.    Review of Systems  Cardiovascular: Negative for  chest pain.  Gastrointestinal: Negative for abdominal pain, constipation, diarrhea, nausea and vomiting.  Psychiatric/Behavioral: Negative for depression, hallucinations, substance abuse and suicidal ideas. The patient is not nervous/anxious and does not have insomnia.   All other systems reviewed and are negative.   Blood pressure (!) 120/55, pulse 79, temperature 99 F (37.2 C), temperature source Axillary, resp. rate 18, height 6' (1.829 m), weight 92.3 kg (203 lb 7.8 oz), SpO2 97 %.Body mass index is 27.6 kg/m.  General Appearance: Fairly Groomed, elderly, Caucasian male who appears younger than his stated age and is lying in bed in a hospital gown with unshaved face. NAD.   Eye Contact:  Fair  Speech:  Clear and Coherent and Normal Rate  Volume:  Normal  Mood:  Euthymic  Affect:  Constricted  Thought Process:  Linear and Descriptions of Associations: Intact  Orientation:  Other:  Only to person.  Thought Content:  Logical  Suicidal Thoughts:  No  Homicidal Thoughts:  No  Memory:  Immediate;   Poor Recent;   Fair Remote;   Fair  Judgement:  Impaired  Insight:  Lacking  Psychomotor Activity:  Decreased  Concentration:  Concentration: Good and Attention Span: Good  Recall:  Poor  Fund of Knowledge:  Fair  Language:  Good  Akathisia:  No  Handed:  Right  AIMS (if indicated):   N/A  Assets:  Housing Intimacy Social Support  ADL's:  Impaired at this time due to AMS.   Cognition:  Impaired due to AMS.  Sleep:   Okay   Assessment:  Dennis [name] is a 72 y.o. male who was admitted with right hip fracture. His hospital course  has been complicated by intermittent confusion and increasing agitation following surgical hip repair. He appears to be doing better today according to wife and sitter. Recommend continuing Haldol as needed for agitation secondary to AMS.   Treatment Plan Summary: -Recommend continuing Haldol 5 mg q 8 hours PRN for agitation secondary to AMS. Can schedule  if frequently requiring medication. -If Haldol ineffective then would try Zyprexa 5 mg BID PRN.  -EKG reviewed and QTc 447 on 3/23. Continue to monitor for QTc prolongation if frequently requires QTc prolonging agents.   -Psychiatry will sign off on patient at this time. Please consult psychiatry again as needed.   Disposition: No evidence of imminent risk to self or others at present.   Patient does not meet criteria for psychiatric inpatient admission.  Faythe Dingwall, DO 12/13/2017 1:23 PM

## 2017-12-13 NOTE — Progress Notes (Signed)
To CT accompanied by Nurse Tech Conservator, museum/gallery).

## 2017-12-13 NOTE — Progress Notes (Signed)
Patient ID: Dennis [name], male   DOB: 1945-10-25, 72 y.o.   MRN: [id number] CT head showed left lens dislocation.  I spoke to Dr. Dareen Piano Patel/ophthalmologist on call and discussed the case with him.  He recommends outpatient follow-up with him in his office for further management.

## 2017-12-13 NOTE — Progress Notes (Signed)
Called by primary RN reference agitation and delirium.  RN reports pt has become increasingly agitated throughout the shift.  On my arrival, pt shouting "HELP!, HELP!", he appeared to be hallucinating.  On exam, he is alert and oriented to self.  He denies any history of alcohol or drug abuse. Primary RN spoke with pt's wife who also denies this.  Respiratory rate is controlled, even and unlabored.  Foley catheter in place, draining dark red urine, RN reports MD is aware. Pt is diaphoretic. VS stable throughout the encounter. Bilateral soft wrist restraints in place.   Primary RN received order for 5 mg Haldol x1 IV.  Some improvement noted approximately 20 minutes after administration.  Lights were dimmed in room, attempted to reduce stimulation as much as possible.  Sitter needed for pt safety.   Follow up and chart review reveals pt received additional administration of haldol 5 mg, dilaudid IV and ativan throughout the night.  VS remain stable.  Agitation persists.    Rapid response team will continue to follow.  Pt was placed on our radar.

## 2017-12-13 NOTE — Progress Notes (Signed)
HR sustaining 130's, Pt remains agitated, yelling, pulling at restraints. Pt diaphoretic and labored respirations at times. Provider on call notified. Await return call. Jessie Foot, RN
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
## 2017-12-13 NOTE — Progress Notes (Signed)
ANTICOAGULATION CONSULT NOTE - Follow Up Consult  Pharmacy Consult for heparin Indication: CAD  Labs: Recent Labs    12/11/17 0446  12/11/17 2329 12/12/17 0414 12/12/17 1001 12/13/17 0717  HGB 10.9*  --   --   --  11.6* 10.6*  HCT 32.5*  --   --   --  33.9* 31.2*  PLT 230  --   --   --  247 265  HEPARINUNFRC 0.14*   < > 0.37 0.42  --  0.45  CREATININE  --   --   --   --  1.08 1.23   < > = values in this interval not displayed.    Assessment/Plan:  72yo male therapeutic on heparin after rate changes. Will continue gtt at current rate and continue daily labs  Possible hip surgery Friday  Thank you Anette Guarneri, PharmD [phone]  12/13/2017,10:42 AM
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
## 2017-12-14 DIAGNOSIS — N390 Urinary tract infection, site not specified: Secondary | ICD-10-CM

## 2017-12-14 DIAGNOSIS — R410 Disorientation, unspecified: Secondary | ICD-10-CM

## 2017-12-14 LAB — COMPREHENSIVE METABOLIC PANEL
ALBUMIN: 2.6 g/dL — AB (ref 3.5–5.0)
ALT: 28 U/L (ref 17–63)
AST: 36 U/L (ref 15–41)
Alkaline Phosphatase: 79 U/L (ref 38–126)
Anion gap: 10 (ref 5–15)
BUN: 16 mg/dL (ref 6–20)
CHLORIDE: 107 mmol/L (ref 101–111)
CO2: 22 mmol/L (ref 22–32)
Calcium: 8.4 mg/dL — ABNORMAL LOW (ref 8.9–10.3)
Creatinine, Ser: 1.12 mg/dL (ref 0.61–1.24)
GFR calc Af Amer: >60 mL/min (ref >60)
GFR calc non Af Amer: >60 mL/min (ref >60)
GLUCOSE: 116 mg/dL — AB (ref 65–99)
Potassium: 3.3 mmol/L — ABNORMAL LOW (ref 3.5–5.1)
SODIUM: 139 mmol/L (ref 135–145)
Total Bilirubin: 1 mg/dL (ref 0.3–1.2)
Total Protein: 6.1 g/dL — ABNORMAL LOW (ref 6.5–8.1)

## 2017-12-14 LAB — URINE CULTURE

## 2017-12-14 LAB — CBC WITH DIFFERENTIAL/PLATELET
BASOS PCT: 1 %
Basophils Absolute: 0.1 10*3/uL (ref 0.0–0.1)
Eosinophils Absolute: 0.4 10*3/uL (ref 0.0–0.7)
Eosinophils Relative: 3 %
HEMATOCRIT: 30.1 % — AB (ref 39.0–52.0)
HEMOGLOBIN: 10.1 g/dL — AB (ref 13.0–17.0)
Lymphocytes Relative: 20 %
Lymphs Abs: 2.1 10*3/uL (ref 0.7–4.0)
MCH: 28.7 pg (ref 26.0–34.0)
MCHC: 33.6 g/dL (ref 30.0–36.0)
MCV: 85.5 fL (ref 78.0–100.0)
Monocytes Absolute: 1 10*3/uL (ref 0.1–1.0)
Monocytes Relative: 9 %
NEUTROS ABS: 7.1 10*3/uL (ref 1.7–7.7)
NEUTROS PCT: 67 %
Platelets: 268 10*3/uL (ref 150–400)
RBC: 3.52 MIL/uL — AB (ref 4.22–5.81)
RDW: 13.6 % (ref 11.5–15.5)
WBC: 10.6 10*3/uL — AB (ref 4.0–10.5)

## 2017-12-14 LAB — AMMONIA: AMMONIA: 23 umol/L (ref 9–35)

## 2017-12-14 LAB — MAGNESIUM: MAGNESIUM: 2 mg/dL (ref 1.7–2.4)

## 2017-12-14 LAB — GLUCOSE, CAPILLARY
GLUCOSE-CAPILLARY: 150 mg/dL — AB (ref 65–99)
GLUCOSE-CAPILLARY: 120 mg/dL — AB (ref 65–99)
GLUCOSE-CAPILLARY: 138 mg/dL — AB (ref 65–99)
Glucose-Capillary: 124 mg/dL — ABNORMAL HIGH (ref 65–99)
Glucose-Capillary: 127 mg/dL — ABNORMAL HIGH (ref 65–99)
Glucose-Capillary: 133 mg/dL — ABNORMAL HIGH (ref 65–99)
Glucose-Capillary: 134 mg/dL — ABNORMAL HIGH (ref 65–99)
Glucose-Capillary: 174 mg/dL — ABNORMAL HIGH (ref 65–99)

## 2017-12-14 LAB — HEPARIN LEVEL (UNFRACTIONATED): Heparin Unfractionated: 0.48 IU/mL (ref 0.30–0.70)

## 2017-12-14 MED ORDER — HYDROCODONE-ACETAMINOPHEN 5-325 MG PO TABS
1.0000 | ORAL_TABLET | Freq: Four times a day (QID) | ORAL | Status: DC | PRN
  Administered 2017-12-14: 2 via ORAL
  Administered 2017-12-15 – 2017-12-16 (×3): 1 via ORAL
  Administered 2017-12-19 – 2017-12-23 (×4): 2 via ORAL
  Administered 2017-12-24: 1 via ORAL
  Filled 2017-12-14: qty 1
  Filled 2017-12-14 (×5): qty 2
  Filled 2017-12-14 (×3): qty 1
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
## 2017-12-15 DIAGNOSIS — I4891 Unspecified atrial fibrillation: Secondary | ICD-10-CM

## 2017-12-15 DIAGNOSIS — I480 Paroxysmal atrial fibrillation: Secondary | ICD-10-CM

## 2017-12-15 LAB — CBC
HCT: 30.3 % — ABNORMAL LOW (ref 39.0–52.0)
Hemoglobin: 10.3 g/dL — ABNORMAL LOW (ref 13.0–17.0)
MCH: 29.3 pg (ref 26.0–34.0)
MCHC: 34 g/dL (ref 30.0–36.0)
MCV: 86.3 fL (ref 78.0–100.0)
PLATELETS: 265 10*3/uL (ref 150–400)
RBC: 3.51 MIL/uL — ABNORMAL LOW (ref 4.22–5.81)
RDW: 13.6 % (ref 11.5–15.5)
WBC: 13.6 10*3/uL — ABNORMAL HIGH (ref 4.0–10.5)

## 2017-12-15 LAB — BASIC METABOLIC PANEL
ANION GAP: 12 (ref 5–15)
BUN: 18 mg/dL (ref 6–20)
CALCIUM: 8.3 mg/dL — AB (ref 8.9–10.3)
CO2: 20 mmol/L — ABNORMAL LOW (ref 22–32)
Chloride: 107 mmol/L (ref 101–111)
Creatinine, Ser: 1.15 mg/dL (ref 0.61–1.24)
GLUCOSE: 140 mg/dL — AB (ref 65–99)
Potassium: 3.4 mmol/L — ABNORMAL LOW (ref 3.5–5.1)
SODIUM: 139 mmol/L (ref 135–145)

## 2017-12-15 LAB — GLUCOSE, CAPILLARY
GLUCOSE-CAPILLARY: 132 mg/dL — AB (ref 65–99)
GLUCOSE-CAPILLARY: 153 mg/dL — AB (ref 65–99)
Glucose-Capillary: 141 mg/dL — ABNORMAL HIGH (ref 65–99)
Glucose-Capillary: 160 mg/dL — ABNORMAL HIGH (ref 65–99)
Glucose-Capillary: 154 mg/dL — ABNORMAL HIGH (ref 65–99)

## 2017-12-15 LAB — SURGICAL PCR SCREEN
MRSA, PCR: NEGATIVE
Staphylococcus aureus: NEGATIVE

## 2017-12-15 LAB — HEPARIN LEVEL (UNFRACTIONATED): HEPARIN UNFRACTIONATED: 0.45 [IU]/mL (ref 0.30–0.70)

## 2017-12-15 LAB — MAGNESIUM: Magnesium: 2 mg/dL (ref 1.7–2.4)

## 2017-12-15 MED ORDER — HEPARIN (PORCINE) IN NACL 100-0.45 UNIT/ML-% IJ SOLN
2200.0000 [IU]/h | INTRAMUSCULAR | Status: DC
  Administered 2017-12-16 – 2017-12-18 (×4): 2200 [IU]/h via INTRAVENOUS
  Filled 2017-12-15 (×5): qty 250

## 2017-12-15 MED ORDER — HEPARIN (PORCINE) IN NACL 100-0.45 UNIT/ML-% IJ SOLN
2000.0000 [IU]/h | INTRAMUSCULAR | Status: DC
  Filled 2017-12-15: qty 250

## 2017-12-15 MED ORDER — INSULIN ASPART 100 UNIT/ML ~~LOC~~ SOLN
0.0000 [IU] | Freq: Three times a day (TID) | SUBCUTANEOUS | Status: DC
  Administered 2017-12-16 – 2017-12-17 (×4): 2 [IU] via SUBCUTANEOUS
  Administered 2017-12-17: 3 [IU] via SUBCUTANEOUS
  Administered 2017-12-17: 2 [IU] via SUBCUTANEOUS
  Administered 2017-12-18: 1 [IU] via SUBCUTANEOUS
  Administered 2017-12-18 – 2017-12-19 (×3): 2 [IU] via SUBCUTANEOUS
  Administered 2017-12-19: 3 [IU] via SUBCUTANEOUS
  Administered 2017-12-20: 2 [IU] via SUBCUTANEOUS
  Administered 2017-12-20 (×2): 3 [IU] via SUBCUTANEOUS
  Administered 2017-12-21: 2 [IU] via SUBCUTANEOUS
  Administered 2017-12-21: 1 [IU] via SUBCUTANEOUS
  Administered 2017-12-21 – 2017-12-22 (×2): 2 [IU] via SUBCUTANEOUS
  Administered 2017-12-22: 3 [IU] via SUBCUTANEOUS
  Administered 2017-12-22 – 2017-12-24 (×3): 2 [IU] via SUBCUTANEOUS
  Administered 2017-12-24 – 2017-12-25 (×2): 1 [IU] via SUBCUTANEOUS
  Administered 2017-12-25: 2 [IU] via SUBCUTANEOUS

## 2017-12-15 MED ORDER — DILTIAZEM LOAD VIA INFUSION
15.0000 mg | Freq: Once | INTRAVENOUS | Status: AC
  Administered 2017-12-15: 15 mg via INTRAVENOUS
  Filled 2017-12-15: qty 15

## 2017-12-15 MED ORDER — DILTIAZEM HCL-DEXTROSE 100-5 MG/100ML-% IV SOLN (PREMIX)
5.0000 mg/h | INTRAVENOUS | Status: DC
  Administered 2017-12-15: 15 mg/h via INTRAVENOUS
  Administered 2017-12-15: 10 mg/h via INTRAVENOUS
  Administered 2017-12-15: 5 mg/h via INTRAVENOUS
  Administered 2017-12-16 – 2017-12-17 (×2): 10 mg/h via INTRAVENOUS
  Filled 2017-12-15 (×6): qty 100

## 2017-12-15 MED ORDER — MUPIROCIN 2 % EX OINT
1.0000 "application " | TOPICAL_OINTMENT | Freq: Two times a day (BID) | CUTANEOUS | Status: AC
  Administered 2017-12-15 – 2017-12-17 (×5): 1 via NASAL
  Filled 2017-12-15: qty 22

## 2017-12-15 MED ORDER — POTASSIUM CHLORIDE 10 MEQ/100ML IV SOLN
10.0000 meq | INTRAVENOUS | Status: AC
  Administered 2017-12-15 (×6): 10 meq via INTRAVENOUS
  Filled 2017-12-15 (×6): qty 100
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
## 2017-12-15 NOTE — Progress Notes (Signed)
ANTICOAGULATION CONSULT NOTE - Follow Up Consult  Pharmacy Consult for heparin Indication: CAD  Labs: Recent Labs    12/13/17 0717 12/14/17 0433 12/15/17 0447  HGB 10.6* 10.1* 10.3*  HCT 31.2* 30.1* 30.3*  PLT 265 268 265  HEPARINUNFRC 0.45 0.48 0.45  CREATININE 1.23 1.12 1.15    Assessment/Plan:  72yo male to restart heparin drip after held this morning with plans for hip surgery. Back in Afib - plan to go to surgery on 4/1 now.   Will resume heparin infusion at same rate as previously since therapeutic - 2000 units/hr. Hgb stable at 10.3, plts WNL. No signs/sympoms of bleeding.   Plan:  Resume heparin at 2000 units/hr  Will monitor with daily HL and CBC Will monitor for signs/symptoms of bleeding Stop date on heparin infusion for 4/1 at 0600  Thank you Doylene Canard, PharmD Clinical Pharmacist  Pager: [phone] Clinical Phone for 12/15/2017 until 3:30pm: [phone] If after 3:30pm, please call main pharmacy at [phone] 12/15/2017,3:52 PM

## 2017-12-15 NOTE — Progress Notes (Addendum)
Patient converted to NSR at 1718. 12-lead EKG obtained and placed on chart.

## 2017-12-15 NOTE — Progress Notes (Addendum)
ANTICOAGULATION CONSULT NOTE - Follow Up Consult  Pharmacy Consult for heparin Indication: CAD  Labs: Recent Labs    12/13/17 0717 12/14/17 0433 12/15/17 0447  HGB 10.6* 10.1* 10.3*  HCT 31.2* 30.1* 30.3*  PLT 265 268 265  HEPARINUNFRC 0.45 0.48 0.45  CREATININE 1.23 1.12 1.15    Assessment/Plan:  72yo male to restart heparin dripafter held this morning with plans for hip surgery Back in Afib, surgery still planned today?  Heparin off in preparation for surgery Follow up after surgery  Thank you Anette Guarneri, PharmD [phone]  12/15/2017,9:43 AM

## 2017-12-15 NOTE — Progress Notes (Signed)
Cardizem drip increased to 15mg /hr.  HR now remaining between 92 and 116.  Updated OR RN.

## 2017-12-15 NOTE — Progress Notes (Signed)
At [phone] patient converted to NSR with PAC's for 20 seconds, then went into a-fib RVR.  Rate between 120's to 160's.  Notified Dr Starla Link and Dr. Tamala Julian. 12-lead EKG performed and reviewed by Dr. Tamala Julian.  Patient remains asymptomatic.

## 2017-12-15 NOTE — Progress Notes (Signed)
At [place], patient went into sustained SVT with a rate of 135.  12-Lead EKG performed.  Patient asymptomatic.  BP 152/91.  AM dose of Metoprolol and Lisinopril given.  Notified Dr. Starla Link.

## 2017-12-15 NOTE — Progress Notes (Signed)
Text MD to have Novolog orders discontinue from the [facility].  As of 3/24, patient's new orders is to monitored patient as ACHS.

## 2017-12-15 NOTE — Progress Notes (Signed)
Progress Note  Patient Name: Dennis [name] Date of Encounter: 12/15/2017  Primary Cardiologist: Carlyle Dolly, MD   Subjective   Asymptomatic despite developing AF at ~ 6 AM. Not volunteering much information.  Inpatient Medications    Scheduled Meds: . aspirin EC  81 mg Oral Daily  . atorvastatin  80 mg Oral q1800  . diltiazem  15 mg Intravenous Once  . haloperidol lactate  5 mg Intravenous Once  . insulin aspart  0-9 Units Subcutaneous Q4H  . lisinopril  5 mg Oral Daily  . metoprolol tartrate  25 mg Oral BID  . mupirocin ointment  1 application Nasal BID  . polyethylene glycol  17 g Oral Daily  . senna  1 tablet Oral QHS   Continuous Infusions: . cefTRIAXone (ROCEPHIN)  IV Stopped (12/14/17 1836)  . diltiazem (CARDIZEM) infusion    . potassium chloride 10 mEq (12/15/17 0820)   PRN Meds: acetaminophen, diazepam, haloperidol lactate, HYDROcodone-acetaminophen, naLOXone (NARCAN)  injection, ondansetron (ZOFRAN) IV   Vital Signs    Vitals:   12/15/17 0246 12/15/17 0406 12/15/17 0500 12/15/17 0750  BP:  (!) 174/78 (!) 152/67 (!) 152/91  Pulse:  98  (!) 133  Resp:  19    Temp:  99.3 F (37.4 C)  98.1 F (36.7 C)  TempSrc:  Oral  Oral  SpO2: 95% 96% 95% 96%  Weight:  200 lb 6.4 oz (90.9 kg)    Height:        Intake/Output Summary (Last 24 hours) at 12/15/2017 0858 Last data filed at 12/15/2017 0742 Gross per 24 hour  Intake 934 ml  Output 1500 ml  Net -566 ml   Filed Weights   12/09/17 1644 12/12/17 0435 12/15/17 0406  Weight: 200 lb 1.6 oz (90.8 kg) 203 lb 7.8 oz (92.3 kg) 200 lb 6.4 oz (90.9 kg)    Telemetry    AF with RVR - Personally Reviewed  ECG    A F with RVR and no acute ischemic changes. - Personally Reviewed  Physical Exam  Lying flat without dyspnea or chest pain GEN: No acute distress.   Neck: No JVD Cardiac: Rapid IIRR, no murmurs, rubs, or gallops.  Respiratory: Clear to auscultation bilaterally. GI: Soft, nontender,  non-distended  MS: No edema; No deformity. Neuro:  Nonfocal  Psych: Normal affect   Labs    Chemistry Recent Labs  Lab 12/09/17 1813  12/13/17 0717 12/14/17 0433 12/15/17 0447  NA 136   < > 137 139 139  K 3.9   < > 4.1 3.3* 3.4*  CL 104   < > 103 107 107  CO2 22   < > 22 22 20*  GLUCOSE 105*   < > 163* 116* 140*  BUN 18   < > 18 16 18   CREATININE 1.25*   < > 1.23 1.12 1.15  CALCIUM 8.3*   < > 8.5* 8.4* 8.3*  PROT 6.6  --   --  6.1*  --   ALBUMIN 2.9*  --   --  2.6*  --   AST 25  --   --  36  --   ALT 19  --   --  28  --   ALKPHOS 86  --   --  79  --   BILITOT 1.1  --   --  1.0  --   GFRNONAA 57*   < > 58* >60 >60  GFRAA >60   < > >60 >60 >60  ANIONGAP  10   < > 12 10 12    < > = values in this interval not displayed.     Hematology Recent Labs  Lab 12/13/17 0717 12/14/17 0433 12/15/17 0447  WBC 13.3* 10.6* 13.6*  RBC 3.67* 3.52* 3.51*  HGB 10.6* 10.1* 10.3*  HCT 31.2* 30.1* 30.3*  MCV 85.0 85.5 86.3  MCH 28.9 28.7 29.3  MCHC 34.0 33.6 34.0  RDW 13.1 13.6 13.6  PLT 265 268 265    Cardiac EnzymesNo results for input(s): TROPONINI in the last 168 hours. No results for input(s): TROPIPOC in the last 168 hours.   BNPNo results for input(s): BNP, PROBNP in the last 168 hours.   DDimer No results for input(s): DDIMER in the last 168 hours.   Radiology    Ct Head Wo Contrast  Result Date: 12/13/2017 CLINICAL DATA:  Agitated, hallucinations, possible fall on blood thinners. Rule out bleed. EXAM: CT HEAD WITHOUT CONTRAST TECHNIQUE: Contiguous axial images were obtained from the base of the skull through the vertex without intravenous contrast. COMPARISON:  None. FINDINGS: Brain: Mild generalized parenchymal atrophy with commensurate dilatation of the ventricles and sulci. Mild chronic small vessel ischemic changes noted within the periventricular white matter regions. No mass, hemorrhage, edema or other evidence of acute parenchymal abnormality. No extra-axial  hemorrhage. Vascular: There are chronic calcified atherosclerotic changes of the large vessels at the skull base. No unexpected hyperdense vessel. Skull: Normal. Negative for fracture or focal lesion. Sinuses/Orbits: Curvilinear hyperdense focus within the inner aspects of the left orbital globe, compatible with lens displacement. Mucosal thickening within the right sphenoid sinus, of uncertain chronicity but new compared to previous head CT of 12/20/2011. Other: None. IMPRESSION: 1. No acute intracranial abnormality. No intracranial mass, hemorrhage or edema. 2. Left lens dislocation to the medial aspects of the left orbital globe. Clinical correlation and/or ophthalmology consultation recommended. Electronically Signed   By: Franki Cabot M.D.   On: 12/13/2017 14:18   US Renal  Result Date: 12/13/2017 CLINICAL DATA:  Hematuria EXAM: RENAL / URINARY TRACT ULTRASOUND COMPLETE COMPARISON:  02/23/2015 FINDINGS: Right Kidney: Length: 12.3 cm. Echogenicity within normal limits. No mass or hydronephrosis visualized. Left Kidney: Length: 12.7 cm. Echogenicity within normal limits. No mass or hydronephrosis visualized. Bladder: Decompressed by Foley catheter. IMPRESSION: No acute abnormality noted. Electronically Signed   By: Inez Catalina M.D.   On: 12/13/2017 16:54   Dg Chest Port 1 View  Result Date: 12/13/2017 CLINICAL DATA:  Shortness of Breath EXAM: PORTABLE CHEST 1 VIEW COMPARISON:  December 09, 2017. FINDINGS: There is no edema or consolidation. Heart is upper normal in size with pulmonary vascularity within normal limits. No adenopathy. There is degenerative change in the thoracic spine. There is aortic atherosclerosis. IMPRESSION: No edema or consolidation. Stable cardiac silhouette. There is aortic atherosclerosis. Aortic Atherosclerosis (ICD10-I70.0). Electronically Signed   By: Lowella Grip III M.D.   On: 12/13/2017 13:11    Cardiac Studies   None  Patient Profile     72 y.o. male with PMH of  CAD with recent PCI, DM, hypertension, HLD, tobacco abuse who presented with right hip fracture and seen for pre op clearance.      Assessment & Plan   1. AF with RVR, new, but hemodynamically and clinically stable. Plan IV diltiazem to control rate and hope for spontaneous conversion. Once rate slowed, should still be able to proceed to hip surgery today. Considered Amio but allergic to iodine. 2. Right hip fracture for surgery today. AF  slightly increases risk but hemodynamically stable and okay to proceed to surgery once rate controlled on diltiazem.  For questions or updates, please contact [place] Please consult [email or web address] for contact info under Cardiology/STEMI.      Signed, Sinclair Grooms, MD  12/15/2017, 8:58 AM

## 2017-12-15 NOTE — Progress Notes (Signed)
Asked to take over R hip fx by Dr. Stann Mainland. Plan for R THA today. Cont NPO. D/C heparin gtt.

## 2017-12-15 NOTE — Plan of Care (Signed)
  Problem: Clinical Measurements: Goal: Ability to maintain clinical measurements within normal limits will improve Outcome: Progressing VS stable. Pt intermittently agitated and confused throughout the night. PRN medication given. Pt disoriented to time, place and situation. Pt now resting comfortably . Will continue to monitor and assess. Fall risk bundle in place. No new signs of skin break down this shift. Pt has remained NPO after midnight.

## 2017-12-15 NOTE — Care Management Important Message (Signed)
Important Message  Patient Details  Name: Dennis [name] MRN: [id number] Date of Birth: Jul 31, 1946   Medicare Important Message Given:  Yes    Barb Merino Benita Boonstra 12/15/2017, 12:55 PM

## 2017-12-15 NOTE — Progress Notes (Signed)
Cardizem bolus given, drip initiated at 5mg /hr.  Paged Ortho PA to inform him of the change in patient's status, as he is scheduled for surgery this afternoon.

## 2017-12-16 DIAGNOSIS — E7800 Pure hypercholesterolemia, unspecified: Secondary | ICD-10-CM

## 2017-12-16 LAB — BASIC METABOLIC PANEL
Anion gap: 12 (ref 5–15)
BUN: 18 mg/dL (ref 6–20)
CO2: 19 mmol/L — ABNORMAL LOW (ref 22–32)
CREATININE: 1.14 mg/dL (ref 0.61–1.24)
Calcium: 8.4 mg/dL — ABNORMAL LOW (ref 8.9–10.3)
Chloride: 105 mmol/L (ref 101–111)
GFR calc Af Amer: >60 mL/min (ref >60)
GFR calc non Af Amer: >60 mL/min (ref >60)
Glucose, Bld: 170 mg/dL — ABNORMAL HIGH (ref 65–99)
Potassium: 3.8 mmol/L (ref 3.5–5.1)
SODIUM: 136 mmol/L (ref 135–145)

## 2017-12-16 LAB — HEPARIN LEVEL (UNFRACTIONATED)
HEPARIN UNFRACTIONATED: 0.42 [IU]/mL (ref 0.30–0.70)
Heparin Unfractionated: <0.1 IU/mL — ABNORMAL LOW (ref 0.30–0.70)
Heparin Unfractionated: 0.2 IU/mL — ABNORMAL LOW (ref 0.30–0.70)

## 2017-12-16 LAB — CBC
HCT: 31.6 % — ABNORMAL LOW (ref 39.0–52.0)
Hemoglobin: 10.4 g/dL — ABNORMAL LOW (ref 13.0–17.0)
MCH: 28.7 pg (ref 26.0–34.0)
MCHC: 32.9 g/dL (ref 30.0–36.0)
MCV: 87.3 fL (ref 78.0–100.0)
PLATELETS: 288 10*3/uL (ref 150–400)
RBC: 3.62 MIL/uL — AB (ref 4.22–5.81)
RDW: 13.9 % (ref 11.5–15.5)
WBC: 20.1 10*3/uL — ABNORMAL HIGH (ref 4.0–10.5)

## 2017-12-16 LAB — MAGNESIUM: MAGNESIUM: 1.9 mg/dL (ref 1.7–2.4)

## 2017-12-16 LAB — GLUCOSE, CAPILLARY
GLUCOSE-CAPILLARY: 161 mg/dL — AB (ref 65–99)
GLUCOSE-CAPILLARY: 198 mg/dL — AB (ref 65–99)
Glucose-Capillary: 183 mg/dL — ABNORMAL HIGH (ref 65–99)
Glucose-Capillary: 163 mg/dL — ABNORMAL HIGH (ref 65–99)

## 2017-12-16 MED ORDER — HALOPERIDOL LACTATE 5 MG/ML IJ SOLN
2.0000 mg | Freq: Once | INTRAMUSCULAR | Status: AC
  Administered 2017-12-16: 2 mg via INTRAVENOUS
  Filled 2017-12-16: qty 1

## 2017-12-16 NOTE — Plan of Care (Signed)
Pt remains confused, no restraints needed, family at bedside, receives IV ABX, cooperative with repositioning in bed with staff assistance

## 2017-12-16 NOTE — Progress Notes (Signed)
ANTICOAGULATION CONSULT NOTE - Follow Up Consult  Pharmacy Consult for heparin Indication: CAD  Labs: Recent Labs    12/14/17 0433 12/15/17 0447 12/15/17 1846 12/16/17 0413 12/16/17 1332  HGB 10.1* 10.3*  --  10.4*  --   HCT 30.1* 30.3*  --  31.6*  --   PLT 268 265  --  288  --   HEPARINUNFRC 0.48 0.45 <0.10* <0.10* 0.20*  CREATININE 1.12 1.15  --  1.14  --     Assessment/Plan:  72yo male on heparin drip with plans for hip surgery. Back in Afib - plan to go to surgery on 4/1 now.   Previously therapeutic on 2000 units/hr heparin rate. Currently subtherapeutic at 0.20 on 2000 units/hr. Verified with nurse no issues with heparin. CBC stable. No signs/sympoms of bleeding.   Plan:  Increase heparin to 2200 units/hr  2100 HL Will monitor with daily HL and CBC Will monitor for signs/symptoms of bleeding Stop date on heparin infusion for 4/1 at [place], PharmD Pharmacy Resident Clinical Phone for 12/16/2017 until 3:30pm: [phone] If after 3:30pm, please call main pharmacy at [phone] 12/16/2017 2:39 PM
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
## 2017-12-16 NOTE — Progress Notes (Signed)
Orthopedics Progress Note  Subjective: Patient somewhat confused this morning but says hip doesn't hurt unless he moves it.  Objective:  Vitals:   12/15/17 2248 12/16/17 0407  BP:  131/60  Pulse: 84 84  Resp:  20  Temp:  98.8 F (37.1 C)  SpO2:  96%    General: Awake and alert  Musculoskeletal: Right LE with limited motion due to pain, short and externally rotated. Patient has protective sensation and wiggles fingers. Neurovascularly intact  Lab Results  Component Value Date   WBC 20.1 (H) 12/16/2017   HGB 10.4 (L) 12/16/2017   HCT 31.6 (L) 12/16/2017   MCV 87.3 12/16/2017   PLT 288 12/16/2017       Component Value Date/Time   NA 136 12/16/2017 0413   K 3.8 12/16/2017 0413   CL 105 12/16/2017 0413   CO2 19 (L) 12/16/2017 0413   GLUCOSE 170 (H) 12/16/2017 0413   BUN 18 12/16/2017 0413   CREATININE 1.14 12/16/2017 0413   CALCIUM 8.4 (L) 12/16/2017 0413   GFRNONAA >60 12/16/2017 0413   GFRAA >60 12/16/2017 0413    No results found for: INR, PROTIME  Assessment/Plan: Right displaced hip fracture Plan for TOTAL HIP ARTHROPLASTY ANTERIOR APPROACH once cleared medically Anticipate Monday. Supportive care and bedrest for now,  Doran Heater. Veverly Fells, MD 12/16/2017 10:42 AM

## 2017-12-16 NOTE — Progress Notes (Signed)
Patient received haldol to help decrease his anxiety.  Patient took out his leads and one IV for the 2nd time.  Mittens were placed on patient once again and IV were wrapped to prevent another incident.  I will keep monitoring patient.

## 2017-12-16 NOTE — Progress Notes (Signed)
ANTICOAGULATION CONSULT NOTE - Follow Up Consult  Pharmacy Consult for heparin Indication: CAD  Labs: Recent Labs    12/14/17 0433 12/15/17 0447  12/16/17 0413 12/16/17 1332 12/16/17 2119  HGB 10.1* 10.3*  --  10.4*  --   --   HCT 30.1* 30.3*  --  31.6*  --   --   PLT 268 265  --  288  --   --   HEPARINUNFRC 0.48 0.45   < > <0.10* 0.20* 0.42  CREATININE 1.12 1.15  --  1.14  --   --    < > = values in this interval not displayed.    Assessment/Plan:  72yo male on heparin drip with plans for hip surgery. Back in Afib - plan to go to surgery on 4/1 now.   Heparin level therapeutic at 0.42. CBC stable. No signs/sympoms of bleeding.   Plan:  Continue heparin at 2200 units/hr  Confirmatory heparin level with AM labs Monitor daily heparin level and CBC, s/sx bleeding Stop date on heparin infusion for 4/1 at [place], PharmD, BCPS Clinical Pharmacist 12/16/2017 10:35 PM

## 2017-12-16 NOTE — Progress Notes (Signed)
Patient pulled out his 2 IVs, all his cardic leads, and gown.  Patient is getting anxious.  I have given him Valium to relax the rest of the night and placed him on mittens for his safety.   I will keep monitor patient.
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
## 2017-12-17 DIAGNOSIS — I4891 Unspecified atrial fibrillation: Secondary | ICD-10-CM

## 2017-12-17 DIAGNOSIS — E538 Deficiency of other specified B group vitamins: Secondary | ICD-10-CM

## 2017-12-17 LAB — CBC
HCT: 32.3 % — ABNORMAL LOW (ref 39.0–52.0)
Hemoglobin: 10.7 g/dL — ABNORMAL LOW (ref 13.0–17.0)
MCH: 28.8 pg (ref 26.0–34.0)
MCHC: 33.1 g/dL (ref 30.0–36.0)
MCV: 86.8 fL (ref 78.0–100.0)
PLATELETS: 319 10*3/uL (ref 150–400)
RBC: 3.72 MIL/uL — AB (ref 4.22–5.81)
RDW: 13.9 % (ref 11.5–15.5)
WBC: 24.8 10*3/uL — ABNORMAL HIGH (ref 4.0–10.5)

## 2017-12-17 LAB — BASIC METABOLIC PANEL
Anion gap: 12 (ref 5–15)
BUN: 16 mg/dL (ref 6–20)
CALCIUM: 8.6 mg/dL — AB (ref 8.9–10.3)
CO2: 20 mmol/L — ABNORMAL LOW (ref 22–32)
CREATININE: 1.15 mg/dL (ref 0.61–1.24)
Chloride: 103 mmol/L (ref 101–111)
GFR calc Af Amer: >60 mL/min (ref >60)
GLUCOSE: 219 mg/dL — AB (ref 65–99)
POTASSIUM: 3.8 mmol/L (ref 3.5–5.1)
SODIUM: 135 mmol/L (ref 135–145)

## 2017-12-17 LAB — HEPATIC FUNCTION PANEL
ALK PHOS: 107 U/L (ref 38–126)
ALT: 41 U/L (ref 17–63)
AST: 37 U/L (ref 15–41)
Albumin: 2.6 g/dL — ABNORMAL LOW (ref 3.5–5.0)
BILIRUBIN DIRECT: 0.2 mg/dL (ref 0.1–0.5)
BILIRUBIN TOTAL: 1.2 mg/dL (ref 0.3–1.2)
Indirect Bilirubin: 1 mg/dL — ABNORMAL HIGH (ref 0.3–0.9)
Total Protein: 6.8 g/dL (ref 6.5–8.1)

## 2017-12-17 LAB — TSH: TSH: 1.901 u[IU]/mL (ref 0.350–4.500)

## 2017-12-17 LAB — GLUCOSE, CAPILLARY
GLUCOSE-CAPILLARY: 184 mg/dL — AB (ref 65–99)
Glucose-Capillary: 228 mg/dL — ABNORMAL HIGH (ref 65–99)
Glucose-Capillary: 155 mg/dL — ABNORMAL HIGH (ref 65–99)
Glucose-Capillary: 141 mg/dL — ABNORMAL HIGH (ref 65–99)

## 2017-12-17 LAB — VITAMIN B12: Vitamin B-12: 179 pg/mL — ABNORMAL LOW (ref 180–914)

## 2017-12-17 LAB — AMMONIA: Ammonia: 23 umol/L (ref 9–35)

## 2017-12-17 LAB — HEPARIN LEVEL (UNFRACTIONATED): HEPARIN UNFRACTIONATED: 0.42 [IU]/mL (ref 0.30–0.70)

## 2017-12-17 LAB — MAGNESIUM: Magnesium: 1.9 mg/dL (ref 1.7–2.4)

## 2017-12-17 MED ORDER — METOPROLOL TARTRATE 50 MG PO TABS
50.0000 mg | ORAL_TABLET | Freq: Two times a day (BID) | ORAL | Status: DC
  Administered 2017-12-17 – 2017-12-20 (×7): 50 mg via ORAL
  Filled 2017-12-17 (×7): qty 1

## 2017-12-17 MED ORDER — KETOROLAC TROMETHAMINE 15 MG/ML IJ SOLN
15.0000 mg | Freq: Four times a day (QID) | INTRAMUSCULAR | Status: DC | PRN
  Administered 2017-12-17 – 2017-12-22 (×9): 15 mg via INTRAVENOUS
  Filled 2017-12-17 (×9): qty 1

## 2017-12-17 MED ORDER — DILTIAZEM HCL-DEXTROSE 100-5 MG/100ML-% IV SOLN (PREMIX)
5.0000 mg/h | INTRAVENOUS | Status: DC
  Administered 2017-12-17 – 2017-12-18 (×2): 5 mg/h via INTRAVENOUS
  Administered 2017-12-19: 10 mg/h via INTRAVENOUS
  Filled 2017-12-17 (×3): qty 100

## 2017-12-17 MED ORDER — CEFAZOLIN SODIUM-DEXTROSE 2-4 GM/100ML-% IV SOLN
2.0000 g | INTRAVENOUS | Status: AC
  Administered 2017-12-18: 2 g via INTRAVENOUS
  Filled 2017-12-17 (×2): qty 100

## 2017-12-17 MED ORDER — CHLORHEXIDINE GLUCONATE 4 % EX LIQD
60.0000 mL | Freq: Once | CUTANEOUS | Status: AC
  Administered 2017-12-18: 4 via TOPICAL
  Filled 2017-12-17: qty 15

## 2017-12-17 MED ORDER — GLYCERIN (LAXATIVE) 2.1 G RE SUPP
1.0000 | Freq: Every day | RECTAL | Status: DC | PRN
  Administered 2017-12-17: 1 via RECTAL
  Filled 2017-12-17 (×2): qty 1

## 2017-12-17 MED ORDER — CYANOCOBALAMIN 1000 MCG/ML IJ SOLN
1000.0000 ug | Freq: Every day | INTRAMUSCULAR | Status: DC
  Administered 2017-12-17 – 2017-12-22 (×6): 1000 ug via INTRAMUSCULAR
  Filled 2017-12-17 (×7): qty 1

## 2017-12-17 MED ORDER — DILTIAZEM LOAD VIA INFUSION
10.0000 mg | Freq: Once | INTRAVENOUS | Status: AC
  Administered 2017-12-17: 10 mg via INTRAVENOUS
  Filled 2017-12-17: qty 10

## 2017-12-17 NOTE — Progress Notes (Signed)
Subjective: Pt c/o mild pain in the right hip. No n/v.  OR cancelled Friday due to cardiac issues.  Pt scheduled for right hip surgery on Monday.   Objective: Vital signs in last 24 hours: Temp:  [97.9 F (36.6 C)-99.3 F (37.4 C)] 99.3 F (37.4 C) (03/31 0405) Pulse Rate:  [80-109] 109 (03/31 0928) Resp:  [20] 20 (03/31 0405) BP: (104-154)/(55-83) 104/66 (03/31 0928) SpO2:  [94 %-96 %] 96 % (03/31 0405) Weight:  [88.3 kg (194 lb 10.7 oz)] 88.3 kg (194 lb 10.7 oz) (03/31 0500)  Intake/Output from previous day: 03/30 0701 - 03/31 0700 In: 120 [P.O.:120] Out: 1600 [Urine:1600] Intake/Output this shift: No intake/output data recorded.  Recent Labs    12/15/17 0447 12/16/17 0413 12/17/17 0338  HGB 10.3* 10.4* 10.7*   Recent Labs    12/16/17 0413 12/17/17 0338  WBC 20.1* 24.8*  RBC 3.62* 3.72*  HCT 31.6* 32.3*  PLT 288 319   Recent Labs    12/16/17 0413 12/17/17 0338  NA 136 135  K 3.8 3.8  CL 105 103  CO2 19* 20*  BUN 18 16  CREATININE 1.14 1.15  GLUCOSE 170* 219*  CALCIUM 8.4* 8.6*   No results for input(s): LABPT, INR in the last 72 hours.  PE:  wn wd male in nad.  R LE shortened and externally rotated.  Skin healthy and intact.  Wiggles toes.    Assessment/Plan: R hip fracture - to OR Monday for arthroplasty.  NPO after midnight.  D/c heparin at 0600 Monday 12/18/17.   Derek Huneycutt 12/17/2017, 10:20 AM
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
## 2017-12-17 NOTE — Progress Notes (Signed)
1900 Bedside shift report. Pt sleeping comfortably, easy to arouse. NAD, pt confused, reoriented. Fall precautions in place, [facility].   2000 Pt assessed, see flow sheet.

## 2017-12-17 NOTE — Progress Notes (Signed)
ANTICOAGULATION CONSULT NOTE - Follow Up Consult  Pharmacy Consult for heparin Indication: CAD  Labs: Recent Labs    12/15/17 0447  12/16/17 0413 12/16/17 1332 12/16/17 2119 12/17/17 0338  HGB 10.3*  --  10.4*  --   --  10.7*  HCT 30.3*  --  31.6*  --   --  32.3*  PLT 265  --  288  --   --  319  HEPARINUNFRC 0.45   < > <0.10* 0.20* 0.42 0.42  CREATININE 1.15  --  1.14  --   --  1.15   < > = values in this interval not displayed.    Assessment/Plan:  72yo male on heparin drip with plans for hip surgery. Back in Afib - plan to go to surgery on 4/1 now.   Heparin level therapeutic at 0.42 on 2200 units/hr. CBC stable. No signs/sympoms of bleeding.   Plan:  Continue heparin at 2200 units/hr  Monitor daily heparin level and CBC, s/sx bleeding Stop date on heparin infusion for 4/1 at [place], PharmD Pharmacy Resident Clinical Phone for 12/17/2017 until 3:30pm: [phone] If after 3:30pm, please call main pharmacy at [phone] 12/17/2017 7:44 AM

## 2017-12-17 NOTE — Plan of Care (Signed)
Pt is confused, impulsive at times. No family at bedside to educate

## 2017-12-18 ENCOUNTER — Inpatient Hospital Stay (HOSPITAL_COMMUNITY): Payer: Medicare HMO

## 2017-12-18 ENCOUNTER — Encounter (HOSPITAL_COMMUNITY): Payer: Self-pay | Admitting: Anesthesiology

## 2017-12-18 ENCOUNTER — Inpatient Hospital Stay (HOSPITAL_COMMUNITY): Payer: Medicare HMO | Admitting: Certified Registered Nurse Anesthetist

## 2017-12-18 ENCOUNTER — Encounter (HOSPITAL_COMMUNITY)
Admission: AD | Disposition: A | Discharge status: Skilled Nursing Facility | Payer: Self-pay | Attending: Internal Medicine

## 2017-12-18 HISTORY — PX: TOTAL HIP ARTHROPLASTY: SHX124

## 2017-12-18 LAB — COMPREHENSIVE METABOLIC PANEL
ALBUMIN: 2.4 g/dL — AB (ref 3.5–5.0)
ALK PHOS: 97 U/L (ref 38–126)
ALT: 49 U/L (ref 17–63)
AST: 48 U/L — AB (ref 15–41)
Anion gap: 9 (ref 5–15)
BILIRUBIN TOTAL: 0.8 mg/dL (ref 0.3–1.2)
BUN: 25 mg/dL — AB (ref 6–20)
CO2: 20 mmol/L — ABNORMAL LOW (ref 22–32)
CREATININE: 1.19 mg/dL (ref 0.61–1.24)
Calcium: 8.4 mg/dL — ABNORMAL LOW (ref 8.9–10.3)
Chloride: 109 mmol/L (ref 101–111)
GFR calc Af Amer: >60 mL/min (ref >60)
GLUCOSE: 143 mg/dL — AB (ref 65–99)
Potassium: 3.8 mmol/L (ref 3.5–5.1)
Sodium: 138 mmol/L (ref 135–145)
TOTAL PROTEIN: 6.2 g/dL — AB (ref 6.5–8.1)

## 2017-12-18 LAB — GLUCOSE, CAPILLARY
GLUCOSE-CAPILLARY: 141 mg/dL — AB (ref 65–99)
GLUCOSE-CAPILLARY: 171 mg/dL — AB (ref 65–99)
GLUCOSE-CAPILLARY: 158 mg/dL — AB (ref 65–99)
Glucose-Capillary: 142 mg/dL — ABNORMAL HIGH (ref 65–99)
Glucose-Capillary: 181 mg/dL — ABNORMAL HIGH (ref 65–99)

## 2017-12-18 LAB — CBC
HEMATOCRIT: 32 % — AB (ref 39.0–52.0)
HEMOGLOBIN: 10.5 g/dL — AB (ref 13.0–17.0)
MCH: 29.4 pg (ref 26.0–34.0)
MCHC: 32.8 g/dL (ref 30.0–36.0)
MCV: 89.6 fL (ref 78.0–100.0)
Platelets: 327 10*3/uL (ref 150–400)
RBC: 3.57 MIL/uL — AB (ref 4.22–5.81)
RDW: 14.6 % (ref 11.5–15.5)
WBC: 21.7 10*3/uL — ABNORMAL HIGH (ref 4.0–10.5)

## 2017-12-18 LAB — FOLATE RBC
FOLATE, HEMOLYSATE: 185.1 ng/mL
Folate, RBC: 580 ng/mL (ref >498)
Hematocrit: 31.9 % — ABNORMAL LOW (ref 37.5–51.0)

## 2017-12-18 LAB — MAGNESIUM: Magnesium: 2.1 mg/dL (ref 1.7–2.4)

## 2017-12-18 LAB — HEPARIN LEVEL (UNFRACTIONATED): Heparin Unfractionated: 0.25 IU/mL — ABNORMAL LOW (ref 0.30–0.70)

## 2017-12-18 SURGERY — ARTHROPLASTY, HIP, TOTAL, ANTERIOR APPROACH
Anesthesia: General | Laterality: Right

## 2017-12-18 MED ORDER — METOCLOPRAMIDE HCL 5 MG/ML IJ SOLN: 10.0000 mg | Freq: Once | INTRAMUSCULAR | Status: DC | PRN

## 2017-12-18 MED ORDER — ALBUMIN HUMAN 5 % IV SOLN
INTRAVENOUS | Status: DC | PRN
  Administered 2017-12-18 (×3): via INTRAVENOUS

## 2017-12-18 MED ORDER — METOCLOPRAMIDE HCL 5 MG PO TABS: 5.0000 mg | ORAL_TABLET | Freq: Three times a day (TID) | ORAL | Status: DC | PRN

## 2017-12-18 MED ORDER — FENTANYL CITRATE (PF) 250 MCG/5ML IJ SOLN
INTRAMUSCULAR | Status: AC
  Filled 2017-12-18: qty 5

## 2017-12-18 MED ORDER — KETOROLAC TROMETHAMINE 30 MG/ML IJ SOLN
INTRAMUSCULAR | Status: DC | PRN
  Administered 2017-12-18: 30 mg

## 2017-12-18 MED ORDER — LIDOCAINE HCL (CARDIAC) 20 MG/ML IV SOLN
INTRAVENOUS | Status: DC | PRN
  Administered 2017-12-18: 80 mg via INTRATRACHEAL

## 2017-12-18 MED ORDER — HYDROMORPHONE HCL 1 MG/ML IJ SOLN
INTRAMUSCULAR | Status: DC | PRN
  Administered 2017-12-18 (×2): .25 mg via INTRAVENOUS

## 2017-12-18 MED ORDER — PHENYLEPHRINE HCL 10 MG/ML IJ SOLN
INTRAMUSCULAR | Status: DC | PRN
  Administered 2017-12-18: 40 ug via INTRAVENOUS
  Administered 2017-12-18 (×3): 80 ug via INTRAVENOUS

## 2017-12-18 MED ORDER — ENOXAPARIN SODIUM 40 MG/0.4ML ~~LOC~~ SOLN
40.0000 mg | Freq: Once | SUBCUTANEOUS | Status: AC
  Administered 2017-12-19: 40 mg via SUBCUTANEOUS
  Filled 2017-12-18: qty 0.4

## 2017-12-18 MED ORDER — DOCUSATE SODIUM 100 MG PO CAPS
100.0000 mg | ORAL_CAPSULE | Freq: Two times a day (BID) | ORAL | Status: DC
  Administered 2017-12-18 – 2017-12-25 (×12): 100 mg via ORAL
  Filled 2017-12-18 (×13): qty 1

## 2017-12-18 MED ORDER — ROCURONIUM BROMIDE 100 MG/10ML IV SOLN
INTRAVENOUS | Status: DC | PRN
  Administered 2017-12-18 (×2): 20 mg via INTRAVENOUS
  Administered 2017-12-18: 40 mg via INTRAVENOUS
  Administered 2017-12-18: 20 mg via INTRAVENOUS

## 2017-12-18 MED ORDER — CEFAZOLIN SODIUM-DEXTROSE 2-4 GM/100ML-% IV SOLN
2.0000 g | Freq: Four times a day (QID) | INTRAVENOUS | Status: AC
  Administered 2017-12-18: 2 g via INTRAVENOUS
  Filled 2017-12-18 (×2): qty 100

## 2017-12-18 MED ORDER — KETOROLAC TROMETHAMINE 30 MG/ML IJ SOLN
INTRAMUSCULAR | Status: AC
  Filled 2017-12-18: qty 1

## 2017-12-18 MED ORDER — TRANEXAMIC ACID 1000 MG/10ML IV SOLN
2000.0000 mg | Freq: Once | INTRAVENOUS | Status: DC
  Filled 2017-12-18: qty 20

## 2017-12-18 MED ORDER — SODIUM CHLORIDE 0.9 % IR SOLN
Status: DC | PRN
  Administered 2017-12-18: 3000 mL

## 2017-12-18 MED ORDER — 0.9 % SODIUM CHLORIDE (POUR BTL) OPTIME
TOPICAL | Status: DC | PRN
  Administered 2017-12-18: 1000 mL

## 2017-12-18 MED ORDER — SODIUM CHLORIDE 0.9 % IJ SOLN
INTRAMUSCULAR | Status: DC | PRN
  Administered 2017-12-18: 30 mL via INTRAVENOUS

## 2017-12-18 MED ORDER — LACTATED RINGERS IV SOLN
Freq: Once | INTRAVENOUS | Status: AC
  Administered 2017-12-18: 12:00:00 via INTRAVENOUS

## 2017-12-18 MED ORDER — ONDANSETRON HCL 4 MG PO TABS: 4.0000 mg | ORAL_TABLET | Freq: Four times a day (QID) | ORAL | Status: DC | PRN

## 2017-12-18 MED ORDER — MEPERIDINE HCL 50 MG/ML IJ SOLN: 6.2500 mg | INTRAMUSCULAR | Status: DC | PRN

## 2017-12-18 MED ORDER — BUPIVACAINE-EPINEPHRINE (PF) 0.5% -1:200000 IJ SOLN
INTRAMUSCULAR | Status: AC
  Filled 2017-12-18: qty 30

## 2017-12-18 MED ORDER — BUPIVACAINE-EPINEPHRINE (PF) 0.5% -1:200000 IJ SOLN
INTRAMUSCULAR | Status: DC | PRN
  Administered 2017-12-18: 30 mL

## 2017-12-18 MED ORDER — FENTANYL CITRATE (PF) 100 MCG/2ML IJ SOLN
INTRAMUSCULAR | Status: DC | PRN
  Administered 2017-12-18: 50 ug via INTRAVENOUS
  Administered 2017-12-18: 75 ug via INTRAVENOUS
  Administered 2017-12-18: 50 ug via INTRAVENOUS
  Administered 2017-12-18: 75 ug via INTRAVENOUS

## 2017-12-18 MED ORDER — PROPOFOL 10 MG/ML IV BOLUS
INTRAVENOUS | Status: AC
  Filled 2017-12-18: qty 20

## 2017-12-18 MED ORDER — SUGAMMADEX SODIUM 200 MG/2ML IV SOLN
INTRAVENOUS | Status: DC | PRN
  Administered 2017-12-18: 200 mg via INTRAVENOUS

## 2017-12-18 MED ORDER — ONDANSETRON HCL 4 MG/2ML IJ SOLN: 4.0000 mg | Freq: Four times a day (QID) | INTRAMUSCULAR | Status: DC | PRN

## 2017-12-18 MED ORDER — PHENOL 1.4 % MT LIQD: 1.0000 | OROMUCOSAL | Status: DC | PRN

## 2017-12-18 MED ORDER — SODIUM CHLORIDE 0.9 % IV SOLN
INTRAVENOUS | Status: AC | PRN
  Administered 2017-12-18: 2000 mg via TOPICAL

## 2017-12-18 MED ORDER — MENTHOL 3 MG MT LOZG: 1.0000 | LOZENGE | OROMUCOSAL | Status: DC | PRN

## 2017-12-18 MED ORDER — MIDAZOLAM HCL 2 MG/2ML IJ SOLN
INTRAMUSCULAR | Status: AC
  Filled 2017-12-18: qty 2

## 2017-12-18 MED ORDER — LACTATED RINGERS IV SOLN
INTRAVENOUS | Status: DC | PRN
  Administered 2017-12-18 (×2): via INTRAVENOUS

## 2017-12-18 MED ORDER — HYDROMORPHONE HCL 1 MG/ML IJ SOLN
INTRAMUSCULAR | Status: AC
  Filled 2017-12-18: qty 0.5

## 2017-12-18 MED ORDER — SUGAMMADEX SODIUM 200 MG/2ML IV SOLN
INTRAVENOUS | Status: AC
  Filled 2017-12-18: qty 2

## 2017-12-18 MED ORDER — PROPOFOL 10 MG/ML IV BOLUS
INTRAVENOUS | Status: DC | PRN
  Administered 2017-12-18: 120 mg via INTRAVENOUS

## 2017-12-18 MED ORDER — PHENYLEPHRINE HCL 10 MG/ML IJ SOLN
INTRAVENOUS | Status: DC | PRN
  Administered 2017-12-18: 25 ug/min via INTRAVENOUS

## 2017-12-18 MED ORDER — ONDANSETRON HCL 4 MG/2ML IJ SOLN
INTRAMUSCULAR | Status: AC
  Filled 2017-12-18: qty 2

## 2017-12-18 MED ORDER — SODIUM CHLORIDE 0.9 % IV SOLN
INTRAVENOUS | Status: AC | PRN
  Administered 2017-12-18: 1000 mL via INTRAMUSCULAR

## 2017-12-18 MED ORDER — LIDOCAINE HCL (CARDIAC) 20 MG/ML IV SOLN
INTRAVENOUS | Status: AC
  Filled 2017-12-18: qty 5

## 2017-12-18 MED ORDER — PROPOFOL 1000 MG/100ML IV EMUL
INTRAVENOUS | Status: AC
  Filled 2017-12-18: qty 100

## 2017-12-18 MED ORDER — METOCLOPRAMIDE HCL 5 MG/ML IJ SOLN: 5.0000 mg | Freq: Three times a day (TID) | INTRAMUSCULAR | Status: DC | PRN

## 2017-12-18 MED ORDER — FENTANYL CITRATE (PF) 100 MCG/2ML IJ SOLN: 25.0000 ug | INTRAMUSCULAR | Status: DC | PRN

## 2017-12-18 SURGICAL SUPPLY — 60 items
ADH SKN CLS APL DERMABOND .7 (GAUZE/BANDAGES/DRESSINGS) ×2
ALCOHOL ISOPROPYL (RUBBING) (MISCELLANEOUS) ×3 IMPLANT
BLADE CLIPPER SURG (BLADE) IMPLANT
CABLE ASSY CERCLAGE SST 1.8X55 (Orthopedic Implant) ×2 IMPLANT
CANISTER WOUNDNEG PRESSURE 500 (CANNISTER) ×2 IMPLANT
CAPT HIP TOTAL 2 ×2 IMPLANT
CHLORAPREP W/TINT 26ML (MISCELLANEOUS) ×3 IMPLANT
COVER SURGICAL LIGHT HANDLE (MISCELLANEOUS) ×3 IMPLANT
DERMABOND ADVANCED (GAUZE/BANDAGES/DRESSINGS) ×4
DERMABOND ADVANCED .7 DNX12 (GAUZE/BANDAGES/DRESSINGS) ×2 IMPLANT
DRAPE C-ARM 42X72 X-RAY (DRAPES) ×3 IMPLANT
DRAPE STERI IOBAN 125X83 (DRAPES) ×1 IMPLANT
DRAPE SURG ISO 125X83 STRL (DRAPES) ×2 IMPLANT
DRAPE U-SHAPE 47X51 STRL (DRAPES) ×9 IMPLANT
DRSG AQUACEL AG ADV 3.5X10 (GAUZE/BANDAGES/DRESSINGS) ×1 IMPLANT
ELECT BLADE 4.0 EZ CLEAN MEGAD (MISCELLANEOUS) ×3
ELECT PENCIL ROCKER SW 15FT (MISCELLANEOUS) ×3 IMPLANT
ELECT REM PT RETURN 9FT ADLT (ELECTROSURGICAL) ×3
ELECTRODE BLDE 4.0 EZ CLN MEGD (MISCELLANEOUS) ×1 IMPLANT
ELECTRODE REM PT RTRN 9FT ADLT (ELECTROSURGICAL) ×1 IMPLANT
EVACUATOR 1/8 PVC DRAIN (DRAIN) IMPLANT
GLOVE BIO SURGEON STRL SZ8.5 (GLOVE) ×6 IMPLANT
GLOVE BIOGEL PI IND STRL 8.5 (GLOVE) ×1 IMPLANT
GLOVE BIOGEL PI INDICATOR 8.5 (GLOVE) ×2
GOWN STRL REUS W/ TWL LRG LVL3 (GOWN DISPOSABLE) ×2 IMPLANT
GOWN STRL REUS W/TWL 2XL LVL3 (GOWN DISPOSABLE) ×3 IMPLANT
GOWN STRL REUS W/TWL LRG LVL3 (GOWN DISPOSABLE) ×6
HANDPIECE INTERPULSE COAX TIP (DISPOSABLE) ×3
HOOD PEEL AWAY FACE SHEILD DIS (HOOD) ×6 IMPLANT
KIT BASIN OR (CUSTOM PROCEDURE TRAY) ×3 IMPLANT
KIT DRSG PREVENA PLUS 7DAY 125 (MISCELLANEOUS) ×2 IMPLANT
KIT PREVENA INCISION MGT 13 (CANNISTER) ×2 IMPLANT
KIT TURNOVER KIT B (KITS) ×3 IMPLANT
MANIFOLD NEPTUNE II (INSTRUMENTS) ×3 IMPLANT
MARKER SKIN DUAL TIP RULER LAB (MISCELLANEOUS) ×6 IMPLANT
NDL SPNL 18GX3.5 QUINCKE PK (NEEDLE) ×1 IMPLANT
NEEDLE SPNL 18GX3.5 QUINCKE PK (NEEDLE) ×3 IMPLANT
NS IRRIG 1000ML POUR BTL (IV SOLUTION) ×3 IMPLANT
PACK TOTAL JOINT (CUSTOM PROCEDURE TRAY) ×3 IMPLANT
PACK UNIVERSAL I (CUSTOM PROCEDURE TRAY) ×3 IMPLANT
PAD ARMBOARD 7.5X6 YLW CONV (MISCELLANEOUS) ×6 IMPLANT
SAW OSC TIP CART 19.5X105X1.3 (SAW) ×3 IMPLANT
SEALER BIPOLAR AQUA 6.0 (INSTRUMENTS) ×2 IMPLANT
SET HNDPC FAN SPRY TIP SCT (DISPOSABLE) ×1 IMPLANT
SOL PREP POV-IOD 4OZ 10% (MISCELLANEOUS) ×3 IMPLANT
SUT ETHIBOND NAB CT1 #1 30IN (SUTURE) ×6 IMPLANT
SUT ETHILON 2 0 PSLX (SUTURE) ×2 IMPLANT
SUT MNCRL AB 3-0 PS2 18 (SUTURE) ×3 IMPLANT
SUT MON AB 2-0 CT1 36 (SUTURE) ×3 IMPLANT
SUT VIC AB 1 CT1 27 (SUTURE) ×3
SUT VIC AB 1 CT1 27XBRD ANBCTR (SUTURE) ×1 IMPLANT
SUT VIC AB 2-0 CT1 27 (SUTURE) ×3
SUT VIC AB 2-0 CT1 TAPERPNT 27 (SUTURE) ×1 IMPLANT
SUT VLOC 180 0 24IN GS25 (SUTURE) ×3 IMPLANT
SYR 50ML LL SCALE MARK (SYRINGE) ×3 IMPLANT
TOWEL OR 17X24 6PK STRL BLUE (TOWEL DISPOSABLE) ×3 IMPLANT
TOWEL OR 17X26 10 PK STRL BLUE (TOWEL DISPOSABLE) ×3 IMPLANT
TRAY CATH 16FR W/PLASTIC CATH (SET/KITS/TRAYS/PACK) IMPLANT
TRAY FOLEY CATH SILVER 16FR (SET/KITS/TRAYS/PACK) IMPLANT
WATER STERILE IRR 1000ML POUR (IV SOLUTION) ×9 IMPLANT

## 2017-12-18 NOTE — Op Note (Signed)
OPERATIVE REPORT  SURGEON: Rod Can, MD   ASSISTANT: Marena Chancy, RNFA.  PREOPERATIVE DIAGNOSIS: Displaced Right femoral neck fracture.     POSTOPERATIVE DIAGNOSIS: Displaced Right femoral neck fracture.     PROCEDURE: Right total hip arthroplasty, anterior approach.   IMPLANTS: DePuy Tri Lock stem, size 8, hi offset. DePuy Pinnacle Cup, size 56 mm. DePuy Altrx liner, size 36 by 56 mm, +4 neutral. DePuy Biolox ceramic head ball, size 36 + 1.5 mm.  ANESTHESIA:  General  ESTIMATED BLOOD LOSS:-600 mL    ANTIBIOTICS: 2 g Ancef.  DRAINS: None.  COMPLICATIONS: None.   CONDITION: PACU - hemodynamically stable.   BRIEF CLINICAL NOTE: Dennis [name] is a 72 y.o. male with a displaced right femoral neck fracture.  He has a history of coronary artery disease with recent MI, requiring PCI.  He is on Brilinta.  He was seen in the emergency department and found to have a displaced right femoral neck fracture.  He was admitted to the hospitalist for perioperative risk stratification and medical optimization.  Due to Brilinta use, we plan to wait greater than 5 days before proceeding with surgery.  Patient then developed A. fib with RVR.  Once he was medically stabilized, he was indicated for total hip arthroplasty. The risks, benefits, and alternatives to the procedure were explained, and the patient elected to proceed.  PROCEDURE IN DETAIL: Surgical site was marked by myself in the pre-op holding area. Once inside the operating room, general anesthesia was obtained. The patient was then positioned on the Hana table. All bony prominences were well padded. The hip was prepped and draped in the normal sterile surgical fashion. A time-out was called verifying side and site of surgery. The patient received IV antibiotics within 60 minutes of beginning the procedure.  The direct anterior approach to the hip was performed through the Hueter interval. Lateral femoral circumflex  vessels were treated with the Auqumantys. The anterior capsule was exposed and an inverted T capsulotomy was made. The fracture hematoma was encountered and evacuated.  He was found to have a comminuted vertical femoral neck fracture.  The femoral neck cut was made to the level of the templated cut. A corkscrew was placed into the head and the head was removed. The head was passed to the back table and was measured.  Acetabular exposure was achieved, and the pulvinar and labrum were excised. Sequential reaming of the acetabulum was then performed up to a size 55 mm reamer. A 56 mm cup was then opened and impacted into place at approximately 40 degrees of abduction and 20 degrees of anteversion. The final polyethylene liner was impacted into place and acetabular osteophytes were removed.   I then gained femoral exposure taking care to protect the abductors and greater trochanter. This was performed using standard external rotation, extension, and adduction. Patient had a V-shaped defect within the posterior medial calcar.  The capsule was peeled off the inner aspect of the greater trochanter, taking care to preserve the short external rotators. A cookie cutter was used to enter the femoral canal, and then the femoral canal finder was placed. Sequential broaching was performed up to a size 7. Calcar planer was used on the femoral neck remnant. I placed a hi offset neck and a trial head ball. The hip was reduced. Leg lengths and offset were checked fluoroscopically. With appropriate leg length, soft tissue tension was inadequate.  The hip was dislocated and trial components were removed. I then placed a single  subperiosteal adult reconstruction cable, just proximal to the level of the lesser trochanter.  I then broached up to a size 8 to allow the implant to sit a little higher and therefore achieve more soft tissue tension.  The final implants were placed, and the hip was reduced.  Fluoroscopy was  used to confirm component position and leg lengths. At 90 degrees of external rotation and full extension, the hip was stable to an anterior directed force.  Please note that lengthening of the extremity was required to achieve soft tissue stability.  With his knee flexion contracture, this may actually equalize his leg lengths.  The wound was copiously irrigated with normal saline using pulse lavage. Marcaine solution was injected into the periarticular soft tissue. The wound was closed in layers using #1 Vicryl and V-Loc for the fascia, 2-0 Vicryl for the subcutaneous fat, 2-0 Monocryl for the deep dermal layer, 2-0 interrupted nylon mattress suture for the skin.  Prevena incisional wound VAC was applied. The patient was transported to the recovery room in stable condition. Sponge, needle, and instrument counts were correct at the end of the case x2. The patient tolerated the procedure well and there were no known complications.  Postoperatively, the patient will be readmitted to the hospitalist.  In 24 hours, heparin drip without bolus may be resumed.  48 hours postop, the heparin may be discontinued, and Brilinta can be started.  He may weight-bear as tolerated with a walker.  He will work with physical therapy and have disposition planning.  He will need to return to the office 7 days after discharge for removal of the wound VAC.

## 2017-12-18 NOTE — Anesthesia Procedure Notes (Signed)
Procedure Name: Intubation Date/Time: 12/18/2017 12:41 PM Performed by: Mariea Clonts, CRNA Pre-anesthesia Checklist: Patient identified, Emergency Drugs available, Suction available and Patient being monitored Patient Re-evaluated:Patient Re-evaluated prior to induction Oxygen Delivery Method: Circle System Utilized Preoxygenation: Pre-oxygenation with 100% oxygen Induction Type: IV induction Ventilation: Mask ventilation without difficulty Laryngoscope Size: Mac and 3 Grade View: Grade II Tube type: Oral Number of attempts: 1 Airway Equipment and Method: Stylet Placement Confirmation: ETT inserted through vocal cords under direct vision,  positive ETCO2 and breath sounds checked- equal and bilateral Secured at: 20 cm Tube secured with: Tape Dental Injury: Teeth and Oropharynx as per pre-operative assessment

## 2017-12-18 NOTE — Progress Notes (Signed)
0100 Pt sleeping, easy to arouse. Asked for wife, reoriented. Pt repositioned and fell back to sleep.   0615 Pt bathed, full linen change. Pt grimaces to turning. Hip precautions taken. No complaints, NAD, fall precautions in place. [place] Bedside shift report with Otila Kluver, RN. Pt sleeping, NAD.
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
## 2017-12-18 NOTE — Anesthesia Preprocedure Evaluation (Addendum)
Anesthesia Evaluation  Patient identified by MRN, date of birth, ID band Patient awake    Reviewed: Allergy & Precautions, NPO status , Patient's Chart, lab work & pertinent test results  Airway Mallampati: II  TM Distance: >3 FB Neck ROM: Full    Dental  (+) Edentulous Upper, Edentulous Lower   Pulmonary former smoker,    Pulmonary exam normal breath sounds clear to auscultation       Cardiovascular hypertension, Pt. on medications and Pt. on home beta blockers + CAD, + Past MI and + Cardiac Stents  + dysrhythmias Atrial Fibrillation  Rhythm:Irregular Rate:Tachycardia  STEMI 2/29/2019   Mid RCA lesion is 100% stenosed.  Dist RCA lesion is 40% stenosed.  Prox RCA lesion is 50% stenosed.  Ost RPDA to RPDA lesion is 99% stenosed.  Prox LAD to Mid LAD lesion is 60% stenosed.  Ost 1st Diag lesion is 90% stenosed.  Ost 2nd Diag to 2nd Diag lesion is 85% stenosed.  Ost 2nd Mrg to 2nd Mrg lesion is 40% stenosed.  Ost Cx to Prox Cx lesion is 40% stenosed.  Ost 1st Mrg to 1st Mrg lesion is 70% stenosed.  The left ventricular systolic function is normal.  LV end diastolic pressure is mildly elevated.  The left ventricular ejection fraction is 55-65% by visual estimate.  Post intervention, there is a 0% residual stenosis.  A drug-eluting stent was successfully placed using a STENT SYNERGY DES 2.25X28.  Post intervention, there is a 0% residual stenosis.  A drug-eluting stent was successfully placed using a STENT SYNERGY DES 3X20.  1. 3 vessel obstructive CAD. - diffuse 60% proximal to mid LAD with severe disease in small diagonal branches. - 70% OM1. Stent in OM 2 is diffusely diseased but patent - 100% mid RCA and 99% PDA 2. Good LV function 3. Mildly elevated LVEDP 4. Successful stenting of the PDA and mid RCA with DES x 2. Diffusely diseased and heavily calcified RCA.  EKG 12/15/17- NSR    Neuro/Psych PSYCHIATRIC DISORDERS negative neurological ROS     GI/Hepatic negative GI ROS, Neg liver ROS,   Endo/Other  diabetes, Well Controlled, Type 2, Insulin Dependent, Oral Hypoglycemic AgentsHyperlipidemia  Renal/GU negative Renal ROS  negative genitourinary   Musculoskeletal Fx Right hip   Abdominal   Peds  Hematology  (+) anemia , Heparin gtt - stopped at 0359   Anesthesia Other Findings   Reproductive/Obstetrics                        Anesthesia Physical Anesthesia Plan  ASA: III  Anesthesia Plan: General   Post-op Pain Management:    Induction: Intravenous  PONV Risk Score and Plan: 2 and Midazolam, Ondansetron, Treatment may vary due to age or medical condition and Dexamethasone  Airway Management Planned: Oral ETT  Additional Equipment:   Intra-op Plan:   Post-operative Plan: Extubation in OR  Informed Consent: I have reviewed the patients History and Physical, chart, labs and discussed the procedure including the risks, benefits and alternatives for the proposed anesthesia with the patient or authorized representative who has indicated his/her understanding and acceptance.   Dental advisory given  Plan Discussed with: CRNA, Anesthesiologist and Surgeon  Anesthesia Plan Comments:        Anesthesia Quick Evaluation

## 2017-12-18 NOTE — Interval H&P Note (Signed)
History and Physical Interval Note:  12/18/2017 12:23 PM  Dennis [name]  has presented today for surgery, with the diagnosis of right hip fracture  The various methods of treatment have been discussed with the patient and family. After consideration of risks, benefits and other options for treatment, the patient has consented to  Procedure(s): TOTAL HIP ARTHROPLASTY ANTERIOR APPROACH (Right) as a surgical intervention .  The patient's history has been reviewed, patient examined, no change in status, stable for surgery.  I have reviewed the patient's chart and labs.  Questions were answered to the patient's satisfaction.    The risks, benefits, and alternatives were discussed with the patient. There are risks associated with the surgery including, but not limited to, problems with anesthesia (death), infection, instability (giving out of the joint), dislocation, differences in leg length/angulation/rotation, fracture of bones, loosening or failure of implants, hematoma (blood accumulation) which may require surgical drainage, blood clots, pulmonary embolism, nerve injury (foot drop and lateral thigh numbness), and blood vessel injury. The patient understands these risks and elects to proceed.    [name]
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
## 2017-12-18 NOTE — Progress Notes (Signed)
Orthopedic Tech Progress Note Patient Details:  Dennis [name] [birth year] [id number]  Ortho Devices Ortho Device/Splint Location: Trapeze bar   Post Interventions Patient Tolerated: Unable to use device properly   [name] 12/18/2017, 4:52 PM

## 2017-12-18 NOTE — Transfer of Care (Signed)
Immediate Anesthesia Transfer of Care Note  Patient: Dennis [name]  Procedure(s) Performed: TOTAL HIP ARTHROPLASTY ANTERIOR APPROACH (Right )  Patient Location: PACU  Anesthesia Type:General  Level of Consciousness: awake, alert  and oriented  Airway & Oxygen Therapy: Patient Spontanous Breathing and Patient connected to nasal cannula oxygen  Post-op Assessment: Report given to RN and Post -op Vital signs reviewed and stable  Post vital signs: Reviewed and stable  Last Vitals:  Vitals Value Taken Time  BP 119/82 12/18/2017  3:29 PM  Temp    Pulse 99 12/18/2017  3:31 PM  Resp 11 12/18/2017  3:31 PM  SpO2 98 % 12/18/2017  3:31 PM  Vitals shown include unvalidated device data.  Last Pain:  Vitals:   12/18/17 0947  TempSrc:   PainSc: 0-No pain      Patients Stated Pain Goal: 0 (07/09/10 7356)  Complications: No apparent anesthesia complications

## 2017-12-19 ENCOUNTER — Other Ambulatory Visit: Payer: Self-pay

## 2017-12-19 ENCOUNTER — Encounter (HOSPITAL_COMMUNITY): Payer: Self-pay | Admitting: Orthopedic Surgery

## 2017-12-19 LAB — BASIC METABOLIC PANEL
Anion gap: 13 (ref 5–15)
BUN: 29 mg/dL — AB (ref 6–20)
CHLORIDE: 104 mmol/L (ref 101–111)
CO2: 21 mmol/L — AB (ref 22–32)
CREATININE: 1.3 mg/dL — AB (ref 0.61–1.24)
Calcium: 8.2 mg/dL — ABNORMAL LOW (ref 8.9–10.3)
GFR calc Af Amer: >60 mL/min (ref >60)
GFR calc non Af Amer: 54 mL/min — ABNORMAL LOW (ref >60)
Glucose, Bld: 179 mg/dL — ABNORMAL HIGH (ref 65–99)
Potassium: 3.9 mmol/L (ref 3.5–5.1)
Sodium: 138 mmol/L (ref 135–145)

## 2017-12-19 LAB — CBC
HEMATOCRIT: 27.5 % — AB (ref 39.0–52.0)
HEMOGLOBIN: 8.6 g/dL — AB (ref 13.0–17.0)
MCH: 28.1 pg (ref 26.0–34.0)
MCHC: 31.3 g/dL (ref 30.0–36.0)
MCV: 89.9 fL (ref 78.0–100.0)
Platelets: 328 10*3/uL (ref 150–400)
RBC: 3.06 MIL/uL — ABNORMAL LOW (ref 4.22–5.81)
RDW: 14.3 % (ref 11.5–15.5)
WBC: 15.5 10*3/uL — ABNORMAL HIGH (ref 4.0–10.5)

## 2017-12-19 LAB — GLUCOSE, CAPILLARY
GLUCOSE-CAPILLARY: 170 mg/dL — AB (ref 65–99)
GLUCOSE-CAPILLARY: 186 mg/dL — AB (ref 65–99)
GLUCOSE-CAPILLARY: 199 mg/dL — AB (ref 65–99)
Glucose-Capillary: 217 mg/dL — ABNORMAL HIGH (ref 65–99)
Glucose-Capillary: 173 mg/dL — ABNORMAL HIGH (ref 65–99)

## 2017-12-19 LAB — CULTURE, BLOOD (ROUTINE X 2)
Culture: NO GROWTH
Culture: NO GROWTH
SPECIAL REQUESTS: ADEQUATE
Special Requests: ADEQUATE

## 2017-12-19 LAB — MAGNESIUM: MAGNESIUM: 1.9 mg/dL (ref 1.7–2.4)

## 2017-12-19 LAB — HEPARIN LEVEL (UNFRACTIONATED)
Heparin Unfractionated: <0.1 IU/mL — ABNORMAL LOW (ref 0.30–0.70)
Heparin Unfractionated: 0.9 IU/mL — ABNORMAL HIGH (ref 0.30–0.70)

## 2017-12-19 MED ORDER — HEPARIN (PORCINE) IN NACL 100-0.45 UNIT/ML-% IJ SOLN
1700.0000 [IU]/h | INTRAMUSCULAR | Status: DC
  Administered 2017-12-19: 2200 [IU]/h via INTRAVENOUS
  Filled 2017-12-19 (×2): qty 250

## 2017-12-19 MED ORDER — CLOPIDOGREL BISULFATE 75 MG PO TABS
75.0000 mg | ORAL_TABLET | Freq: Every day | ORAL | Status: DC
  Administered 2017-12-20 – 2017-12-25 (×6): 75 mg via ORAL
  Filled 2017-12-19 (×6): qty 1

## 2017-12-19 NOTE — Progress Notes (Signed)
ANTICOAGULATION CONSULT NOTE - Follow Up Consult  Pharmacy Consult for Heparin Indication: atrial fibrillation  Allergies  Allergen Reactions  . Fish Allergy Swelling    ALL fish  . Gabapentin Other (See Comments)    Unknown reaction  . Tamsulosin Other (See Comments)    Unknown reaction  . Betadine [Povidone Iodine] Swelling  . Iodine Swelling  . Shellfish Allergy Swelling    Facial swelling  . Sulfamethoxazole-Trimethoprim Swelling    Facial swelling  . Trimethoprim Swelling    Facial swelling  . Tramadol Other (See Comments)    Makes him crazy    Patient Measurements: Height: 6' (182.9 cm) Weight: 194 lb 3.6 oz (88.1 kg) IBW/kg (Calculated) : 77.6 Heparin Dosing Weight: 88 kg  Vital Signs: Temp: 97.1 F (36.2 C) (04/02 1202) Temp Source: Oral (04/02 1202) BP: 86/54 (04/02 1202) Pulse Rate: 99 (04/02 0904)  Labs: Recent Labs    12/17/17 0338 12/18/17 0703 12/19/17 0355  HGB 10.7* 10.5* 8.6*  HCT 32.3*  31.9* 32.0* 27.5*  PLT 319 327 328  HEPARINUNFRC 0.42 0.25* <0.10*  CREATININE 1.15 1.19 1.30*    Estimated Creatinine Clearance: 58 mL/min (A) (by C-G formula based on SCr of 1.3 mg/dL (H)).  Assessment:  72 yr old male started on IV heparin for atrial fibrillation on 3/24.  Heparin held 4/1 for surgery, and to resume at 3pm today (24hrs post-op).  Lovenox 40 mg sq x 1 given this am per Ortho.   Last heparin level as 0.25 (subtherapeutic) on 4/1, but drawn about an hour after drip held for surgery.  Prior level was 0.42 (therapeutic) on 2200 units/hr.   Hgb drop post-op, no bleeding reported.   Recent stent (11/13/17). Was on Brilinta pre-op (held for surgery, last dose 3/22), changing to Plavix post-op, to begin on 4/3. Also on ASA 81 mg daily.  Goal of Therapy:  Heparin level 0.3-0.7 units/ml Monitor platelets by anticoagulation protocol: Yes   Plan:   Resume heparin drip at ~3pm at 2200 units/hr  Heparin level ~6 hrs after drip resumes.  Daily  heparin level and CBC while on heparin.  Noted plan for oral anticoagulation this admit.  Arty Baumgartner, [place] Pager: [phone] 12/19/2017,2:47 PM

## 2017-12-19 NOTE — Anesthesia Postprocedure Evaluation (Signed)
Anesthesia Post Note  Patient: Dennis [name]  Procedure(s) Performed: TOTAL HIP ARTHROPLASTY ANTERIOR APPROACH (Right )     Patient location during evaluation: PACU Anesthesia Type: General Level of consciousness: awake and alert Pain management: pain level controlled Vital Signs Assessment: post-procedure vital signs reviewed and stable Respiratory status: spontaneous breathing, nonlabored ventilation, respiratory function stable and patient connected to nasal cannula oxygen Cardiovascular status: blood pressure returned to baseline and stable Postop Assessment: no apparent nausea or vomiting Anesthetic complications: no    Last Vitals:  Vitals:   12/19/17 0904 12/19/17 1202  BP: (!) 107/52 (!) 86/54  Pulse: 99   Resp:  18  Temp:  (!) 36.2 C  SpO2:      Last Pain:  Vitals:   12/19/17 1202  TempSrc: Oral  PainSc:                  Montez Hageman

## 2017-12-19 NOTE — NC FL2 (Cosign Needed)
[place] LEVEL OF CARE SCREENING TOOL     IDENTIFICATION  Patient Name: Dennis [name] Birthdate: [birth year] Sex: male Admission Date (Current Location): 12/09/2017  [facility] and [place] Number:  Herbalist and Address:  [facility], [place] [address] [place], [place] [address]      Provider Number: [id number]  Attending Physician Name and Address:  Aline August, MD  Relative Name and Phone Number:  Jess Toney, spouse, [phone]    Current Level of Care: Hospital Recommended Level of Care: [place] Prior Approval Number:    Date Approved/Denied:   PASRR Number:    Discharge Plan: Home    Current Diagnoses: Patient Active Problem List   Diagnosis Date Noted  . Atrial fibrillation with RVR ([place])   . New onset a-fib ([place]) 12/15/2017  . Delirium   . Closed right hip fracture ([place]) 12/09/2017  . CAD in native artery 12/04/2017  . Hypertension 12/04/2017  . Hyperlipidemia 11/15/2017  . STEMI involving oth coronary artery of inferior wall ([place]) 11/13/2017  . Tobacco abuse 11/13/2017  . STEMI involving right coronary artery ([place]) 11/13/2017  . STEMI (ST elevation myocardial infarction) ([employer]) 11/13/2017  . Diabetes mellitus due to underlying condition, uncontrolled ([place]) 04/07/2009    Orientation RESPIRATION BLADDER Height & Weight     Self, Time, Place  Normal Continent, Indwelling catheter Weight: 194 lb 3.6 oz (88.1 kg) Height:  6' (182.9 cm)  BEHAVIORAL SYMPTOMS/MOOD NEUROLOGICAL BOWEL NUTRITION STATUS      Continent Diet(please see DC summary)  AMBULATORY STATUS COMMUNICATION OF NEEDS Skin   Limited Assist(min to mod assist) Verbally Surgical wounds(closed incision R hip - negative pressure wound therapy)                       Personal Care Assistance Level of Assistance  Bathing, Feeding, Dressing Bathing Assistance: Limited assistance(min to mod assist) Feeding assistance:  Independent Dressing Assistance: Limited assistance(min to mod assist)     Functional Limitations Info  Sight, Hearing, Speech Sight Info: Impaired(blind L eye) Hearing Info: Adequate Speech Info: Adequate    SPECIAL CARE FACTORS FREQUENCY  PT (By licensed PT), OT (By licensed OT)     PT Frequency: 5x/week OT Frequency: 5x/week            Contractures Contractures Info: Not present    Additional Factors Info  Code Status, Allergies, Insulin Sliding Scale Code Status Info: Full Allergies Info: Fish Allergy, Gabapentin, Tamsulosin, Betadine (Povidone Iodine), Iodine, Shellfish Allergy, Sulfamethoxazole-trimethoprim, Trimethoprim, Tramadol   Insulin Sliding Scale Info: insulin novolog 3x/day with meals       Current Medications (12/19/2017):  This is the current hospital active medication list Current Facility-Administered Medications  Medication Dose Route Frequency Provider Last Rate Last Dose  . acetaminophen (TYLENOL) tablet 650 mg  650 mg Oral Q6H PRN Rod Can, MD   650 mg at 12/19/17 0401  . aspirin EC tablet 81 mg  81 mg Oral Daily Rod Can, MD   81 mg at 12/19/17 0906  . atorvastatin (LIPITOR) tablet 80 mg  80 mg Oral q1800 Rod Can, MD   80 mg at 12/18/17 1753  . cefTRIAXone (ROCEPHIN) 1 g in sodium chloride 0.9 % 100 mL IVPB  1 g Intravenous Q24H Alekh, Kshitiz, MD 200 mL/hr at 12/18/17 1751 1 g at 12/18/17 1751  . [START ON 12/20/2017] clopidogrel (PLAVIX) tablet 75 mg  75 mg Oral Q breakfast Sherren Mocha, MD      .  cyanocobalamin ((VITAMIN B-12)) injection 1,000 mcg  1,000 mcg Intramuscular Daily Rod Can, MD   1,000 mcg at 12/19/17 1132  . diazepam (VALIUM) tablet 10 mg  10 mg Oral Q12H PRN Rod Can, MD   10 mg at 12/18/17 2141  . docusate sodium (COLACE) capsule 100 mg  100 mg Oral BID Rod Can, MD   100 mg at 12/19/17 0904  . Glycerin (Adult) 2.1 g suppository 1 suppository  1 suppository Rectal Daily PRN Rod Can,  MD   1 suppository at 12/17/17 1132  . haloperidol lactate (HALDOL) injection 5 mg  5 mg Intravenous Q6H PRN Rod Can, MD   5 mg at 12/16/17 1949  . HYDROcodone-acetaminophen (NORCO/VICODIN) 5-325 MG per tablet 1-2 tablet  1-2 tablet Oral Q6H PRN Rod Can, MD   2 tablet at 12/19/17 [phone]  . insulin aspart (novoLOG) injection 0-9 Units  0-9 Units Subcutaneous TID WC Rod Can, MD   2 Units at 12/19/17 0904  . ketorolac (TORADOL) 15 MG/ML injection 15 mg  15 mg Intravenous Q6H PRN Rod Can, MD   15 mg at 12/17/17 1828  . lisinopril (PRINIVIL,ZESTRIL) tablet 5 mg  5 mg Oral Daily Rod Can, MD   5 mg at 12/19/17 0905  . menthol-cetylpyridinium (CEPACOL) lozenge 3 mg  1 lozenge Oral PRN Swinteck, Aaron Edelman, MD       Or  . phenol (CHLORASEPTIC) mouth spray 1 spray  1 spray Mouth/Throat PRN Swinteck, Aaron Edelman, MD      . metoCLOPramide (REGLAN) tablet 5-10 mg  5-10 mg Oral Q8H PRN Swinteck, Aaron Edelman, MD       Or  . metoCLOPramide (REGLAN) injection 5-10 mg  5-10 mg Intravenous Q8H PRN Swinteck, Aaron Edelman, MD      . metoprolol tartrate (LOPRESSOR) tablet 50 mg  50 mg Oral BID Rod Can, MD   50 mg at 12/19/17 0905  . naloxone [facility]) injection 0.4 mg  0.4 mg Intravenous PRN Rod Can, MD      . ondansetron (ZOFRAN) tablet 4 mg  4 mg Oral Q6H PRN Swinteck, Aaron Edelman, MD       Or  . ondansetron (ZOFRAN) injection 4 mg  4 mg Intravenous Q6H PRN Swinteck, Aaron Edelman, MD      . polyethylene glycol (MIRALAX / GLYCOLAX) packet 17 g  17 g Oral Daily Rod Can, MD   17 g at 12/19/17 3151  . senna (SENOKOT) tablet 8.6 mg  1 tablet Oral QHS Rod Can, MD   8.6 mg at 12/18/17 2140  . tranexamic acid (CYKLOKAPRON) 2,000 mg in sodium chloride 0.9 % 50 mL Topical Application  7,616 mg Topical Once Rod Can, MD         Discharge Medications: Please see discharge summary for a list of discharge medications.  Relevant Imaging Results:  Relevant Lab Results:   Additional  Information SSN: [id number]  Estanislado Emms, LCSW
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
## 2017-12-19 NOTE — Evaluation (Signed)
Physical Therapy Evaluation Patient Details Name: Dennis [name] MRN: [id number] DOB: 09/14/1946 Today's Date: 12/19/2017   History of Present Illness  pt is a 72 y/o male with pmh significant for CAD, s/p PCI, DM2, HTN, STEMI admitted after sustaining a mechanical fall with R hip fx.  Noted new afib which delayed his surgery.  Pt now s/p R THA with post op VAC placement R hip incision.  Clinical Impression  Pt admitted with/for R THA due to fall with fx.  Pt needing significant assist for all mobility and presently showing signs of delerium.  Pt currently limited functionally due to the problems listed. ( See problems list.)   Pt will benefit from PT to maximize function and safety in order to get ready for next venue listed below.     Follow Up Recommendations SNF    Equipment Recommendations  None recommended by PT    Recommendations for Other Services       Precautions / Restrictions Precautions Precaution Comments: direct anterior approach, no precautions Restrictions Weight Bearing Restrictions: Yes RLE Weight Bearing: Weight bearing as tolerated      Mobility  Bed Mobility Overal bed mobility: Needs Assistance Bed Mobility: Supine to Sit     Supine to sit: Max assist     General bed mobility comments: 2 person assist preferable due to stiffness/ incoordination of pt's movement.  pt needed assist with R LE movement, bridging and trunk.  Transfers Overall transfer level: Needs assistance Equipment used: Rolling walker (2 wheeled) Transfers: Sit to/from [facility] Sit to Stand: Mod assist;+2 physical assistance Stand pivot transfers: Mod assist       General transfer comment: pt unable to get R LE into a good biomechanical position to assist and is having trouble managing/sequencing tasks in general.  Ambulation/Gait Ambulation/Gait assistance: Min assist Ambulation Distance (Feet): 15 Feet Assistive device: Rolling walker (2 wheeled) Gait  Pattern/deviations: Step-to pattern;Decreased step length - right;Decreased stance time - right;Decreased step length - left;Decreased stride length     General Gait Details: pt not advancing R LE without cues.  Pt keeps RLE out significantly in front of the left LE, heavy use of the RW.  Needing frequent assist due to delerium.  Stairs            Wheelchair Mobility    Modified Rankin (Stroke Patients Only)       Balance Overall balance assessment: Needs assistance Sitting-balance support: No upper extremity supported;Single extremity supported Sitting balance-Leahy Scale: Fair       Standing balance-Leahy Scale: Poor Standing balance comment: reliant on bil UE assist                             Pertinent Vitals/Pain Pain Assessment: 0-10 Pain Score: 10-Worst pain ever Pain Location: r hip/leg Pain Descriptors / Indicators: Sharp;Sore Pain Intervention(s): Monitored during session;Limited activity within patient's tolerance    Home Living Family/patient expects to be discharged to:: Private residence Living Arrangements: Spouse/significant other Available Help at Discharge: Family;Available PRN/intermittently Type of Home: House Home Access: Stairs to enter Entrance Stairs-Rails: Right;Left Entrance Stairs-Number of Steps: several Home Layout: One level Home Equipment: Walker - 2 wheels;Cane - single point      Prior Function Level of Independence: Independent               Hand Dominance        Extremity/Trunk Assessment   Upper Extremity Assessment Upper Extremity Assessment:  Overall [facility] for tasks assessed;Generalized weakness    Lower Extremity Assessment Lower Extremity Assessment: RLE deficits/detail;LLE deficits/detail RLE Deficits / Details: general weakness, limited aarom of knee from ~ -10 ext lag to 50* flexion RLE Coordination: decreased fine motor LLE Deficits / Details: WFL ROM, strength grossly 4/5 LLE Coordination:  decreased fine motor       Communication   Communication: No difficulties  Cognition Arousal/Alertness: Awake/alert Behavior During Therapy: Flat affect Overall Cognitive Status: Impaired/Different from baseline                                 General Comments: pt delerious and seeing "thing flying in the air"      General Comments General comments (skin integrity, edema, etc.): VSS overall    Exercises     Assessment/Plan    PT Assessment Patient needs continued PT services  PT Problem List Decreased strength;Decreased activity tolerance;Decreased mobility;Decreased coordination;Decreased knowledge of use of DME;Decreased safety awareness;Pain       PT Treatment Interventions DME instruction;Gait training;Functional mobility training;Therapeutic activities;Therapeutic exercise;Patient/family education    PT Goals (Current goals can be found in the Care Plan section)  Acute Rehab PT Goals PT Goal Formulation: Patient unable to participate in goal setting Time For Goal Achievement: 01/02/18 Potential to Achieve Goals: Fair    Frequency Min 3X/week   Barriers to discharge        Co-evaluation               AM-PAC PT "6 Clicks" Daily Activity  Outcome Measure Difficulty turning over in bed (including adjusting bedclothes, sheets and blankets)?: Unable Difficulty moving from lying on back to sitting on the side of the bed? : Unable Difficulty sitting down on and standing up from a chair with arms (e.g., wheelchair, bedside commode, etc,.)?: Unable Help needed moving to and from a bed to chair (including a wheelchair)?: A Lot Help needed walking in hospital room?: A Lot Help needed climbing 3-5 steps with a railing? : Total 6 Click Score: 8    End of Session   Activity Tolerance: Patient limited by pain Patient left: in chair;with call bell/phone within reach Nurse Communication: Mobility status      Time: 5726-2035 PT Time Calculation  (min) (ACUTE ONLY): 55 min   Charges:   PT Evaluation $PT Eval Moderate Complexity: 1 Mod PT Treatments $Gait Training: 8-22 mins $Therapeutic Activity: 23-37 mins   PT G Codes:        01/11/18  Donnella Sham, PT [phone]  (pager)  Tessie Fass Terrica Duecker Jan 11, 2018, 11:40 AM

## 2017-12-19 NOTE — Progress Notes (Signed)
Patient converted to Afib RVR. EKG taken. MD notified. Verbal orders to restart cardizem drip.

## 2017-12-19 NOTE — Progress Notes (Signed)
ANTICOAGULATION CONSULT NOTE - Follow Up Consult  Pharmacy Consult for Heparin Indication: atrial fibrillation  Allergies  Allergen Reactions  . Fish Allergy Swelling    ALL fish  . Gabapentin Other (See Comments)    Unknown reaction  . Tamsulosin Other (See Comments)    Unknown reaction  . Betadine [Povidone Iodine] Swelling  . Iodine Swelling  . Shellfish Allergy Swelling    Facial swelling  . Sulfamethoxazole-Trimethoprim Swelling    Facial swelling  . Trimethoprim Swelling    Facial swelling  . Tramadol Other (See Comments)    Makes him crazy    Patient Measurements: Height: 6' (182.9 cm) Weight: 194 lb 3.6 oz (88.1 kg) IBW/kg (Calculated) : 77.6 Heparin Dosing Weight: 88 kg  Vital Signs: Temp: 97.9 F (36.6 C) (04/02 2015) Temp Source: Oral (04/02 2015) BP: 114/57 (04/02 2015) Pulse Rate: 89 (04/02 2015)  Labs: Recent Labs    12/17/17 0338 12/18/17 0703 12/19/17 0355 12/19/17 2016  HGB 10.7* 10.5* 8.6*  --   HCT 32.3*  31.9* 32.0* 27.5*  --   PLT 319 327 328  --   HEPARINUNFRC 0.42 0.25* <0.10* 0.90*  CREATININE 1.15 1.19 1.30*  --     Estimated Creatinine Clearance: 58 mL/min (A) (by C-G formula based on SCr of 1.3 mg/dL (H)).  Assessment: 72 yr old male started on IV heparin for atrial fibrillation on 3/24.Heparin held 4/1 for surgery, and to resume at 3pm today (24hrs post-op). Lovenox 40 mg sq x 1 given this am per Ortho. Recent stent (11/13/17). Was on Brilinta pre-op (held for surgery, last dose 3/22), changing to Plavix post-op, to begin on 4/3. Also on ASA 81 mg daily.  Heparin level after restart elevated at 0.90 on 2200 units/hr.  No bleeding per RN.   Goal of Therapy:  Heparin level 0.3-0.7 units/ml Monitor platelets by anticoagulation protocol: Yes   Plan:  Reduce heparin to 2000 units/hr.   Recheck heparin level in am. Daily heparin level and CBC while on heparin. Noted plan for oral anticoagulation this admit.  Sloan Leiter,  PharmD, BCPS, BCCCP Clinical Pharmacist Clinical phone 12/19/2017 until 11PM [phone] After hours, please call [phone] 12/19/2017,9:51 PM

## 2017-12-19 NOTE — Progress Notes (Signed)
Physical Therapy Treatment Patient Details Name: Dennis [name] MRN: [id number] DOB: 1945-11-28 Today's Date: 12/19/2017    History of Present Illness pt is a 72 y/o male with pmh significant for CAD, s/p PCI, DM2, HTN, STEMI admitted after sustaining a mechanical fall with R hip fx.  Noted new afib which delayed his surgery.  Pt now s/p R THA with post op VAC placement R hip incision.    PT Comments    Pt more fatigued and or painful.  Need much more assist to complete less of a pivot to the bed.  Had to bring the bed to the pt due to pt unable to go further.   Follow Up Recommendations  SNF     Equipment Recommendations  None recommended by PT    Recommendations for Other Services       Precautions / Restrictions Precautions Precaution Comments: direct anterior approach, no precautions Restrictions RLE Weight Bearing: Weight bearing as tolerated    Mobility  Bed Mobility Overal bed mobility: Needs Assistance Bed Mobility: Sit to Supine       Sit to supine: Max assist;+2 for physical assistance      Transfers Overall transfer level: Needs assistance Equipment used: Rolling walker (2 wheeled) Transfers: Stand Pivot Transfers;Sit to/from Stand Sit to Stand: Mod assist;Max assist;+2 physical assistance Stand pivot transfers: Max assist;+2 physical assistance       General transfer comment: pt either more fatigued, more painful or both, but took more assist and was unable to w/shift to advance either foot.  Ambulation/Gait                 Stairs            Wheelchair Mobility    Modified Rankin (Stroke Patients Only)       Balance     Sitting balance-Leahy Scale: Fair       Standing balance-Leahy Scale: Poor Standing balance comment: reliant on bil UE assist                            Cognition Arousal/Alertness: Awake/alert Behavior During Therapy: Flat affect Overall Cognitive Status: Impaired/Different from baseline                                         Exercises      General Comments        Pertinent Vitals/Pain Pain Assessment: Faces Faces Pain Scale: Hurts whole lot Pain Location: r hip/leg Pain Descriptors / Indicators: Sharp;Sore Pain Intervention(s): Monitored during session;Limited activity within patient's tolerance    Home Living Family/patient expects to be discharged to:: Unsure Living Arrangements: Spouse/significant other                  Prior Function            PT Goals (current goals can now be found in the care plan section) Acute Rehab PT Goals PT Goal Formulation: Patient unable to participate in goal setting Time For Goal Achievement: 01/02/18 Potential to Achieve Goals: Fair Progress towards PT goals: Progressing toward goals    Frequency    Min 3X/week      PT Plan Current plan remains appropriate    Co-evaluation              AM-PAC PT "6 Clicks" Daily Activity  Outcome Measure  Difficulty  turning over in bed (including adjusting bedclothes, sheets and blankets)?: Unable Difficulty moving from lying on back to sitting on the side of the bed? : Unable Difficulty sitting down on and standing up from a chair with arms (e.g., wheelchair, bedside commode, etc,.)?: Unable Help needed moving to and from a bed to chair (including a wheelchair)?: A Lot Help needed walking in hospital room?: Total Help needed climbing 3-5 steps with a railing? : Total 6 Click Score: 7    End of Session   Activity Tolerance: Patient limited by pain Patient left: in bed;with call bell/phone within reach;with bed alarm set;with nursing/sitter in room Nurse Communication: Mobility status PT Visit Diagnosis: Other abnormalities of gait and mobility (R26.89);Muscle weakness (generalized) (M62.81);Pain Pain - Right/Left: Right Pain - part of body: Hip     Time: 0722-5750 PT Time Calculation (min) (ACUTE ONLY): 16 min  Charges:  $Therapeutic  Activity: 8-22 mins                    G Codes:       January 08, 2018  Donnella Sham, PT [phone]  (pager)   Tessie Fass Dakwan Pridgen 2018/01/08, 5:46 PM
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
## 2017-12-19 NOTE — Clinical Social Work Note (Signed)
Clinical Social Work Assessment  Patient Details  Name: Dennis [name] MRN: [id number] Date of Birth: [birth year]  Date of referral:  12/19/17               Reason for consult:  Facility Placement                Permission sought to share information with:  Facility Sport and exercise psychologist, Family Supports Permission granted to share information::  No(patient asleep, spouse at bedside)  Name::     Dennis [name]  Agency::  [facility]NF[facility]  Relationship::  spouse  Contact Information:  [phone]  Housing/Transportation Living arrangements for the past 2 months:  Single Family Home Source of Information:  Spouse Patient Interpreter Needed:  None Criminal Activity/Legal Involvement Pertinent to Current Situation/Hospitalization:  No - Comment as needed Significant Relationships:  Spouse Lives with:  Spouse Do you feel safe going back to the place where you live?  Yes Need for family participation in patient care:  Yes (Comment)  Care giving concerns: Patient from home with wife in [place], [place]. PT recommending SNF.   Social Worker assessment / plan: CSW met with patient's wife, Dennis [name], at bedside. Patient was asleep. CSW discussed recommendation for SNF. Wife is agreeable to SNF and prefers [facility] in [place], [place], which is closer to their home than the other facilities. CSW sent out initial referrals. CSW called [facility] and confirmed they are in network with [employer]. [facility] to review referral and will start [employer] authorization if they can offer a bed. If they can offer, they will have bed for patient on Thurs 4/4. Patient will need [employer] authorization before he can admit to facility. CSW spoke to MD and MD indicated anticipating discharge for 4/4 or 4/5.  If plan changes and patient cannot admit to [facility] facility, will need to be screened for [facility] for [place] facility.  CSW advised patient's wife that [facility] is outside of [facility]'s 50 mile range. If  patient needs to be transported by [facility], patient will be responsible for the cost of the ambulance. CSW confirmed with [facility] the cost will be $1041.51. CSW advised patient's wife of this cost. Wife is agreeable to pay for the ambulance and indicated she strongly prefers for patient to be at [facility] as opposed to a facility in [place] or in [place].  CSW to follow and support with discharge planning.     Employment status:  Retired Astronomer) PT Recommendations:  [place] / Referral to community resources:  [place]  Patient/Family's Response to care: Wife appreciative of care.  Patient/Family's Understanding of and Emotional Response to Diagnosis, Current Treatment, and Prognosis: Wife with understanding of patient's condition and SNF recommendation. Wife hopeful for placement at [facility].  Emotional Assessment Appearance:  Appears younger than stated age Attitude/Demeanor/Rapport:  Lethargic Affect (typically observed):  Calm Orientation:  Oriented to Self, Oriented to Place, Oriented to  Time Alcohol / Substance use:  Not Applicable Psych involvement (Current and /or in the community):  No (Comment)  Discharge Needs  Concerns to be addressed:  Discharge Planning Concerns, Care Coordination Readmission within the last 30 days:  Yes Current discharge risk:  Physical Impairment Barriers to Discharge:  Continued Medical Work up, [place], LCSW 12/19/2017, 2:57 PM

## 2017-12-19 NOTE — Progress Notes (Signed)
CSW has followed up with [facility] x2 since initial referral call. CSW awaiting call back to confirm bed offer and start [facility] authorization.  Estanislado Emms, [place]

## 2017-12-20 LAB — CBC
HEMATOCRIT: 25.2 % — AB (ref 39.0–52.0)
HEMOGLOBIN: 8.1 g/dL — AB (ref 13.0–17.0)
MCH: 28.6 pg (ref 26.0–34.0)
MCHC: 32.1 g/dL (ref 30.0–36.0)
MCV: 89 fL (ref 78.0–100.0)
Platelets: 354 10*3/uL (ref 150–400)
RBC: 2.83 MIL/uL — ABNORMAL LOW (ref 4.22–5.81)
RDW: 14.4 % (ref 11.5–15.5)
WBC: 17.6 10*3/uL — AB (ref 4.0–10.5)

## 2017-12-20 LAB — MAGNESIUM: Magnesium: 2.1 mg/dL (ref 1.7–2.4)

## 2017-12-20 LAB — BASIC METABOLIC PANEL
Anion gap: 13 (ref 5–15)
BUN: 27 mg/dL — AB (ref 6–20)
CHLORIDE: 104 mmol/L (ref 101–111)
CO2: 21 mmol/L — AB (ref 22–32)
Calcium: 8.1 mg/dL — ABNORMAL LOW (ref 8.9–10.3)
Creatinine, Ser: 1.21 mg/dL (ref 0.61–1.24)
GFR calc non Af Amer: 59 mL/min — ABNORMAL LOW (ref >60)
Glucose, Bld: 164 mg/dL — ABNORMAL HIGH (ref 65–99)
POTASSIUM: 3.9 mmol/L (ref 3.5–5.1)
Sodium: 138 mmol/L (ref 135–145)

## 2017-12-20 LAB — GLUCOSE, CAPILLARY
GLUCOSE-CAPILLARY: 175 mg/dL — AB (ref 65–99)
GLUCOSE-CAPILLARY: 213 mg/dL — AB (ref 65–99)
Glucose-Capillary: 180 mg/dL — ABNORMAL HIGH (ref 65–99)
Glucose-Capillary: 159 mg/dL — ABNORMAL HIGH (ref 65–99)

## 2017-12-20 LAB — HEPARIN LEVEL (UNFRACTIONATED): HEPARIN UNFRACTIONATED: 0.93 [IU]/mL — AB (ref 0.30–0.70)

## 2017-12-20 MED ORDER — METOPROLOL TARTRATE 50 MG PO TABS
50.0000 mg | ORAL_TABLET | Freq: Three times a day (TID) | ORAL | Status: DC
  Administered 2017-12-20 – 2017-12-25 (×16): 50 mg via ORAL
  Filled 2017-12-20 (×17): qty 1

## 2017-12-20 MED ORDER — APIXABAN 2.5 MG PO TABS
2.5000 mg | ORAL_TABLET | Freq: Two times a day (BID) | ORAL | Status: DC
  Administered 2017-12-20 – 2017-12-25 (×11): 2.5 mg via ORAL
  Filled 2017-12-20 (×11): qty 1
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
## 2017-12-20 NOTE — Care Management Important Message (Signed)
Important Message  Patient Details  Name: Dennis [name] MRN: [id number] Date of Birth: February 26, 1946   Medicare Important Message Given:  Yes    [name] 12/20/2017, 1:58 PM
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
## 2017-12-20 NOTE — Progress Notes (Signed)
ANTICOAGULATION CONSULT NOTE - Follow Up Consult  Pharmacy Consult for Heparin  Indication: atrial fibrillation  Allergies  Allergen Reactions  . Fish Allergy Swelling    ALL fish  . Gabapentin Other (See Comments)    Unknown reaction  . Tamsulosin Other (See Comments)    Unknown reaction  . Betadine [Povidone Iodine] Swelling  . Iodine Swelling  . Shellfish Allergy Swelling    Facial swelling  . Sulfamethoxazole-Trimethoprim Swelling    Facial swelling  . Trimethoprim Swelling    Facial swelling  . Tramadol Other (See Comments)    Makes him crazy    Patient Measurements: Height: 6' (182.9 cm) Weight: 191 lb 12.8 oz (87 kg) IBW/kg (Calculated) : 77.6 Heparin Dosing Weight: 88 kg  Vital Signs: Temp: 98.3 F (36.8 C) (04/03 0430) Temp Source: Oral (04/03 0430) BP: 115/61 (04/03 0430) Pulse Rate: 95 (04/03 0430)  Labs: Recent Labs    12/18/17 0703 12/19/17 0355 12/19/17 2016 12/20/17 0347  HGB 10.5* 8.6*  --  8.1*  HCT 32.0* 27.5*  --  25.2*  PLT 327 328  --  354  HEPARINUNFRC 0.25* <0.10* 0.90* 0.93*  CREATININE 1.19 1.30*  --   --     Estimated Creatinine Clearance: 58 mL/min (A) (by C-G formula based on SCr of 1.3 mg/dL (H)).  Assessment: 72 yr old male started on IV heparin for atrial fibrillation on 3/24.Heparin held 4/1 for surgery, and to resume at 3pm today (24hrs post-op). Lovenox 40 mg sq x 1 given this am per Ortho. Recent stent (11/13/17). Was on Brilinta pre-op (held for surgery, last dose 3/22), changing to Plavix post-op, to begin on 4/3. Also on ASA 81 mg daily.  4/3 AM Update: heparin level remains elevated despite rate decrease, no issues per RN.   Goal of Therapy:  Heparin level 0.3-0.7 units/ml Monitor platelets by anticoagulation protocol: Yes   Plan:  Reduce heparin to 1700 units/hr.   [place], PharmD, BCPS Clinical Pharmacist Phone: [phone]

## 2017-12-20 NOTE — Progress Notes (Signed)
CSW received message from admissions at [facility] in [place], [place] this morning. They are starting patient's [employer] authorization process this morning. [employer] determination pending.  Estanislado Emms, [place]

## 2017-12-20 NOTE — Plan of Care (Signed)
  Problem: Skin Integrity: Goal: Risk for impaired skin integrity will decrease Outcome: Progressing  Pt repositioned every two hours. Output collection container used to promote skin integrity.

## 2017-12-20 NOTE — Progress Notes (Addendum)
Progress Note  Patient Name: Dennis [name] Date of Encounter: 12/20/2017  Primary Cardiologist: Dr. Carlyle Dolly  Subjective   Pt more acutely confused today. Unable to answer questions appropriately. Minimizing post procedure narcotics. Denies pain. Remains in Afib>>rates have been somewhat all over the place. Increases with movement and agitation, more stable with rest.  Inpatient Medications    Scheduled Meds: . aspirin EC  81 mg Oral Daily  . atorvastatin  80 mg Oral q1800  . clopidogrel  75 mg Oral Q breakfast  . cyanocobalamin  1,000 mcg Intramuscular Daily  . docusate sodium  100 mg Oral BID  . insulin aspart  0-9 Units Subcutaneous TID WC  . lisinopril  5 mg Oral Daily  . metoprolol tartrate  50 mg Oral BID  . polyethylene glycol  17 g Oral Daily  . senna  1 tablet Oral QHS   Continuous Infusions: . heparin 1,700 Units/hr (12/20/17 0536)  . tranexamic acid (CYKLOKAPRON) topical -INTRAOP     PRN Meds: acetaminophen, diazepam, Glycerin (Adult), haloperidol lactate, HYDROcodone-acetaminophen, ketorolac, menthol-cetylpyridinium **OR** phenol, metoCLOPramide **OR** metoCLOPramide (REGLAN) injection, naLOXone (NARCAN)  injection, ondansetron **OR** ondansetron (ZOFRAN) IV   Vital Signs    Vitals:   12/19/17 2015 12/19/17 2338 12/20/17 0010 12/20/17 0430  BP: (!) 114/57  114/64 115/61  Pulse: 89 (!) 138 (!) 106 95  Resp: 19  19 18   Temp: 97.9 F (36.6 C)  97.6 F (36.4 C) 98.3 F (36.8 C)  TempSrc: Oral  Oral Oral  SpO2: 97%  96% 96%  Weight:    191 lb 12.8 oz (87 kg)  Height:        Intake/Output Summary (Last 24 hours) at 12/20/2017 0742 Last data filed at 12/20/2017 0536 Gross per 24 hour  Intake 733.52 ml  Output 750 ml  Net -16.48 ml   Filed Weights   12/17/17 0500 12/18/17 0615 12/20/17 0430  Weight: 194 lb 10.7 oz (88.3 kg) 194 lb 3.6 oz (88.1 kg) 191 lb 12.8 oz (87 kg)    Physical Exam   General: Well developed, well nourished, NAD Skin:  Warm, dry, intact  Head: Normocephalic, atraumatic, clear, moist mucus membranes. Neck: Negative for carotid bruits. No JVD Lungs: Clear to ausculation bilaterally. No wheezes, rales, or rhonchi. Breathing is unlabored. Cardiovascular: Irregularly irregular with S1 S2. No murmurs, rubs or gallops Abdomen: Soft, non-tender, non-distended with normoactive bowel sounds. No obvious abdominal masses. MSK: Strength and tone appear normal for age. 5/5 in all extremities Extremities: No edema. No clubbing or cyanosis. DP/PT pulses 2+ bilaterally. Bandage to right hip  Neuro: Confused. No focal deficits. No facial asymmetry. MAE spontaneously. Psych: Does not respond to questions appropriately with normal affect.    Labs    Chemistry Recent Labs  Lab 12/14/17 [phone]  12/17/17 0338 12/18/17 0703 12/19/17 0355 12/20/17 0347  NA 139   < > 135 138 138 138  K 3.3*   < > 3.8 3.8 3.9 3.9  CL 107   < > 103 109 104 104  CO2 22   < > 20* 20* 21* 21*  GLUCOSE 116*   < > 219* 143* 179* 164*  BUN 16   < > 16 25* 29* 27*  CREATININE 1.12   < > 1.15 1.19 1.30* 1.21  CALCIUM 8.4*   < > 8.6* 8.4* 8.2* 8.1*  PROT 6.1*  --  6.8 6.2*  --   --   ALBUMIN 2.6*  --  2.6* 2.4*  --   --  AST 36  --  37 48*  --   --   ALT 28  --  41 49  --   --   ALKPHOS 79  --  107 97  --   --   BILITOT 1.0  --  1.2 0.8  --   --   GFRNONAA >60   < > >60 >60 54* 59*  GFRAA >60   < > >60 >60 >60 >60  ANIONGAP 10   < > 12 9 13 13    < > = values in this interval not displayed.     Hematology Recent Labs  Lab 12/18/17 0703 12/19/17 0355 12/20/17 0347  WBC 21.7* 15.5* 17.6*  RBC 3.57* 3.06* 2.83*  HGB 10.5* 8.6* 8.1*  HCT 32.0* 27.5* 25.2*  MCV 89.6 89.9 89.0  MCH 29.4 28.1 28.6  MCHC 32.8 31.3 32.1  RDW 14.6 14.3 14.4  PLT 327 328 354    Cardiac EnzymesNo results for input(s): TROPONINI in the last 168 hours. No results for input(s): TROPIPOC in the last 168 hours.   BNPNo results for input(s): BNP, PROBNP in the  last 168 hours.   DDimer No results for input(s): DDIMER in the last 168 hours.   Radiology    Pelvis Portable  Result Date: 12/18/2017 CLINICAL DATA:  Right hip replacement EXAM: PORTABLE PELVIS 1-2 VIEWS COMPARISON:  CT right hip 12/09/2017 FINDINGS: Right total hip replacement satisfactory position and alignment. Hardware in good position without acute complication IMPRESSION: Satisfactory right hip replacement for fracture. Electronically Signed   By: Franchot Gallo M.D.   On: 12/18/2017 15:50   Dg Knee Right Port  Result Date: 12/18/2017 CLINICAL DATA:  Pain and swelling EXAM: PORTABLE RIGHT KNEE - 1-2 VIEW COMPARISON:  None. FINDINGS: Osteopenia. Severe tricompartment osteoarthritic change. No acute fracture or dislocation. Vascular calcifications are noted. IMPRESSION: No acute bony pathology.  Chronic changes. Electronically Signed   By: Marybelle Killings M.D.   On: 12/18/2017 18:38   Dg C-arm 1-60 Min  Result Date: 12/18/2017 CLINICAL DATA:  Right total hip arthroplasty with anterior approach. EXAM: OPERATIVE RIGHT HIP (WITH PELVIS IF PERFORMED) 5 VIEWS TECHNIQUE: Fluoroscopic spot image(s) were submitted for interpretation post-operatively. FLUOROSCOPY TIME:  22 seconds COMPARISON:  Right hip radiographs-12/09/2017; right hip CT-12/09/2017 FINDINGS: 5 spot intraoperative anterior projection fluoroscopic images of the right hip and lower pelvis are provided for review. Images demonstrates the sequela of right total hip replacement as well as cerclage fixation of the proximal aspect of the right femur. Alignment appears anatomic given solitary AP projection. No definite fracture. Minimal amount of expected adjacent soft cutaneous emphysema. No radiopaque foreign body. IMPRESSION: Post right total hip replacement without evidence of complication. Electronically Signed   By: Sandi Mariscal M.D.   On: 12/18/2017 14:51   Dg C-arm 1-60 Min  Result Date: 12/18/2017 CLINICAL DATA:  Right total hip  arthroplasty with anterior approach. EXAM: OPERATIVE RIGHT HIP (WITH PELVIS IF PERFORMED) 5 VIEWS TECHNIQUE: Fluoroscopic spot image(s) were submitted for interpretation post-operatively. FLUOROSCOPY TIME:  22 seconds COMPARISON:  Right hip radiographs-12/09/2017; right hip CT-12/09/2017 FINDINGS: 5 spot intraoperative anterior projection fluoroscopic images of the right hip and lower pelvis are provided for review. Images demonstrates the sequela of right total hip replacement as well as cerclage fixation of the proximal aspect of the right femur. Alignment appears anatomic given solitary AP projection. No definite fracture. Minimal amount of expected adjacent soft cutaneous emphysema. No radiopaque foreign body. IMPRESSION: Post right total hip replacement  without evidence of complication. Electronically Signed   By: Sandi Mariscal M.D.   On: 12/18/2017 14:51   Dg Hip Operative Unilat W Or W/o Pelvis Right  Result Date: 12/18/2017 CLINICAL DATA:  Right total hip arthroplasty with anterior approach. EXAM: OPERATIVE RIGHT HIP (WITH PELVIS IF PERFORMED) 5 VIEWS TECHNIQUE: Fluoroscopic spot image(s) were submitted for interpretation post-operatively. FLUOROSCOPY TIME:  22 seconds COMPARISON:  Right hip radiographs-12/09/2017; right hip CT-12/09/2017 FINDINGS: 5 spot intraoperative anterior projection fluoroscopic images of the right hip and lower pelvis are provided for review. Images demonstrates the sequela of right total hip replacement as well as cerclage fixation of the proximal aspect of the right femur. Alignment appears anatomic given solitary AP projection. No definite fracture. Minimal amount of expected adjacent soft cutaneous emphysema. No radiopaque foreign body. IMPRESSION: Post right total hip replacement without evidence of complication. Electronically Signed   By: Sandi Mariscal M.D.   On: 12/18/2017 14:51   Telemetry    12/20/17 Atrial fibrillation - Personally Reviewed  ECG    12/18/17 Atrial  fibrillation HR 124- Personally Reviewed  Cardiac Studies   Cardiac cath 11/13/2017 Conclusion     Mid RCA lesion is 100% stenosed.  Dist RCA lesion is 40% stenosed.  Prox RCA lesion is 50% stenosed.  Ost RPDA to RPDA lesion is 99% stenosed.  Prox LAD to Mid LAD lesion is 60% stenosed.  Ost 1st Diag lesion is 90% stenosed.  Ost 2nd Diag to 2nd Diag lesion is 85% stenosed.  Ost 2nd Mrg to 2nd Mrg lesion is 40% stenosed.  Ost Cx to Prox Cx lesion is 40% stenosed.  Ost 1st Mrg to 1st Mrg lesion is 70% stenosed.  The left ventricular systolic function is normal.  LV end diastolic pressure is mildly elevated.  The left ventricular ejection fraction is 55-65% by visual estimate.  Post intervention, there is a 0% residual stenosis.  A drug-eluting stent was successfully placed using a STENT SYNERGY DES 2.25X28.  Post intervention, there is a 0% residual stenosis.  A drug-eluting stent was successfully placed using a STENT SYNERGY DES 3X20.  1. 3 vessel obstructive CAD. - diffuse 60% proximal to mid LAD with severe disease in small diagonal branches. - 70% OM1. Stent in OM 2 is diffusely diseased but patent - 100% mid RCA and 99% PDA 2. Good LV function 3. Mildly elevated LVEDP 4. Successful stenting of the PDA and mid RCA with DES x 2. Diffusely diseased and heavily calcified RCA.  Plan: DAPT indefinitely. Aggressive risk factor modification and smoking cessation.     2D echocardiogram 11/14/2017 Study Conclusions  - Left ventricle: The cavity size was normal. Systolic function was normal. The estimated ejection fraction was in the range of 55% to 60%. Possible mild hypokinesis of the basal-midinferior myocardium. - Left atrium: The atrium was mildly dilated.  Patient Profile     72 y.o. male PMH of CAD with recent PCI,DM, hypertension, HLD, tobacco abusewho presented with right hip fracture and seen for pre-op clearance.  Assessment  & Plan    1.New atrial fibrillation with rapid ventricular response: -Back in atrial fibrillation yesterday, 12/19/17. Rate uncontrolled. AT rest seems to be in the high 90's but will increase with movement and agitation  -Dilt gtt at >>wean to off yesterday   -TSH 1.9 -Continue Metoprolol 50 mg PO BID>> will increase to 50 mg  TID given moderately low BP with hold perameters. -IV hepgtt to be restarted 12/19/17 at 3pm   -Will need  to transition to [facility] prior to D/C when ok with surgery  -CHA2DS2VASc =3  2. Right hip fracture: -s/p arthroscopy of right hip 12/18/17  -Per ortho  3. Acute delirium: -More acutely confused today  -WBC slightly worse from 15.5 12/19/17>>>17.6 today  -Head CT was negative for any acute intracranial abnormality except for left-sided dislocated lens. -Perprimary team   4. CAD s/p PCI 10/2017: -S/pstenting of the PDA and mid RCA with DES x 2  -Plan was for DAPT with aggressive risk factor modification -Brilinta on hold for hip surgery, will need to transition to plavix given plan for [facility] with his new onset Afib -Continue statin, aspirin,beta-blocker  5. Anemia:  -Post op decrease>>8.1 today    Signed, Kathyrn Drown NP-C [facility] Pager: [phone] 12/20/2017, 7:42 AM     For questions or updates, please contact   Please consult [email or web address] for contact info under Cardiology/STEMI.  I have personally seen and examined this patient. I agree with the assessment and plan as outlined above.  He is s/p recent coronary stent placement. Admitted with hip fracture. Now with delirium.  He is back in atrial fib this am. Diltiazem drip was stopped yesterday. I agree that we can attempt rate control by increasing his beta blocker today. If unable to control rate with this approach, may have to stop metoprolol and restart his Diltiazem drip. He is on IV heparin. His Plavix has been restarted today. Per discussion with Dr. Burt Knack and Ortho team  yesterday, ok to start Eliquis 2.5 mg po BID for 2 days then go to full dose after that.  -I will stop his heparin drip today and start Eliquis 2.5 mg po BID.   Lauree Chandler 12/20/2017 9:54 AM

## 2017-12-20 NOTE — Progress Notes (Signed)
    Subjective:  Patient reports pain as mild to moderate.  Denies N/V/CP/SOB.   Objective:   VITALS:   Vitals:   12/20/17 0430 12/20/17 0800 12/20/17 1153 12/20/17 1233  BP: 115/61 (!) 106/94 (!) 84/47 (!) 100/46  Pulse: 95  81   Resp: 18     Temp: 98.3 F (36.8 C)  98.8 F (37.1 C)   TempSrc: Oral  Oral   SpO2: 96%     Weight: 87 kg (191 lb 12.8 oz)     Height:        NAD AMS ABD soft Intact pulses distally Incision: dressing C/D/I Compartment soft Prevena intact Chronic equinus deformity Wiggles toes Unable to full assess sensation due to AMS  Lab Results  Component Value Date   WBC 17.6 (H) 12/20/2017   HGB 8.1 (L) 12/20/2017   HCT 25.2 (L) 12/20/2017   MCV 89.0 12/20/2017   PLT 354 12/20/2017   BMET    Component Value Date/Time   NA 138 12/20/2017 0347   K 3.9 12/20/2017 0347   CL 104 12/20/2017 0347   CO2 21 (L) 12/20/2017 0347   GLUCOSE 164 (H) 12/20/2017 0347   BUN 27 (H) 12/20/2017 0347   CREATININE 1.21 12/20/2017 0347   CALCIUM 8.1 (L) 12/20/2017 0347   GFRNONAA 59 (L) 12/20/2017 0347   GFRAA >60 12/20/2017 0347     Assessment/Plan: 2 Days Post-Op   Principal Problem:   Delirium Active Problems:   CAD in native artery   Closed right hip fracture (HCC)   New onset a-fib (HCC)   Atrial fibrillation with RVR (HCC)   WBAT with walker DVT ppx: eliquis & plavix, SCDs, TEDS PO pain control PT/OT Dispo: SNF placement, convert VAC to portable unit upon d/c   Dennis [name] 12/20/2017, 1:16 PM   Rod Can, MD Cell [phone]

## 2017-12-20 NOTE — Progress Notes (Signed)
Physical Therapy Treatment Patient Details Name: Dennis [name] MRN: [id number] DOB: 07/26/1946 Today's Date: 12/20/2017    History of Present Illness pt is a 72 y/o male with pmh significant for CAD, s/p PCI, DM2, HTN, STEMI admitted after sustaining a mechanical fall with R hip fx.  Noted new afib which delayed his surgery.  Pt now s/p R THA with post op VAC placement R hip incision.    PT Comments    Pt making slow progress.  Limited by R LE pain, decreased R knee ROM, and inability for the pt to place in significant weight into the R LE, thus limited to effort transfers to bed to [facility] to Chair.   Follow Up Recommendations  SNF     Equipment Recommendations  None recommended by PT    Recommendations for Other Services       Precautions / Restrictions Precautions Precaution Comments: direct anterior approach, no precautions Restrictions Weight Bearing Restrictions: Yes RLE Weight Bearing: Weight bearing as tolerated    Mobility  Bed Mobility Overal bed mobility: Needs Assistance Bed Mobility: Supine to Sit     Supine to sit: Max assist;+2 for physical assistance     General bed mobility comments: Assist for LEs to EOB and trunk elevation to sitting. Use of bed pad to scoot hips out to EOB. Cues for hand placement and technique. Pt able to scoot hips minimally at EOB to reposition  Transfers Overall transfer level: Needs assistance Equipment used: Rolling walker (2 wheeled) Transfers: Sit to/from [facility] Sit to Stand: Mod assist;+2 physical assistance Stand pivot transfers: Mod assist;+2 physical assistance       General transfer comment: Cues throughout for sequencing and hand placement.  Ambulation/Gait             General Gait Details: pt unable    Stairs            Wheelchair Mobility    Modified Rankin (Stroke Patients Only)       Balance Overall balance assessment: Needs assistance Sitting-balance support: Feet  supported Sitting balance-Leahy Scale: Fair     Standing balance support: Bilateral upper extremity supported Standing balance-Leahy Scale: Poor Standing balance comment: reliant on bil UE assist.  pt needed assist to maintain standing at [facility] for a sufficient length of time for per-care ` 30 secs.                            Cognition Arousal/Alertness: Awake/alert Behavior During Therapy: Flat affect Overall Cognitive Status: Impaired/Different from baseline Area of Impairment: Following commands;Problem solving                       Following Commands: Follows one step commands consistently;Follows one step commands with increased time     Problem Solving: Slow processing;Decreased initiation;Difficulty sequencing;Requires verbal cues        Exercises      General Comments General comments (skin integrity, edema, etc.): vss      Pertinent Vitals/Pain Pain Assessment: Faces Faces Pain Scale: Hurts even more Pain Location: R hip Pain Descriptors / Indicators: Aching;Sore Pain Intervention(s): Monitored during session    Home Living Family/patient expects to be discharged to:: Private residence Living Arrangements: Spouse/significant other Available Help at Discharge: Family;Available PRN/intermittently Type of Home: House Home Access: Stairs to enter Entrance Stairs-Rails: Right;Left Home Layout: One level Home Equipment: Environmental consultant - 2 wheels;Cane - single point  Prior Function Level of Independence: Independent          PT Goals (current goals can now be found in the care plan section) Acute Rehab PT Goals Patient Stated Goal: decrease pain, move better PT Goal Formulation: Patient unable to participate in goal setting Time For Goal Achievement: 01/02/18 Potential to Achieve Goals: Fair Progress towards PT goals: Progressing toward goals    Frequency    Min 3X/week      PT Plan Current plan remains appropriate     Co-evaluation PT/OT/SLP Co-Evaluation/Treatment: Yes Reason for Co-Treatment: Complexity of the patient's impairments (multi-system involvement) PT goals addressed during session: Mobility/safety with mobility OT goals addressed during session: ADL's and self-care      AM-PAC PT "6 Clicks" Daily Activity  Outcome Measure  Difficulty turning over in bed (including adjusting bedclothes, sheets and blankets)?: Unable Difficulty moving from lying on back to sitting on the side of the bed? : Unable Difficulty sitting down on and standing up from a chair with arms (e.g., wheelchair, bedside commode, etc,.)?: Unable Help needed moving to and from a bed to chair (including a wheelchair)?: A Lot Help needed walking in hospital room?: Total Help needed climbing 3-5 steps with a railing? : Total 6 Click Score: 7    End of Session   Activity Tolerance: Patient limited by pain Patient left: in chair;with call bell/phone within reach;with chair alarm set Nurse Communication: Mobility status PT Visit Diagnosis: Other abnormalities of gait and mobility (R26.89);Muscle weakness (generalized) (M62.81);Pain Pain - Right/Left: Right Pain - part of body: Hip     Time: 7408-1448 PT Time Calculation (min) (ACUTE ONLY): 37 min  Charges:  $Therapeutic Activity: 8-22 mins                    G Codes:       Jan 09, 2018  Donnella Sham, PT [phone]  (pager)   Tessie Fass Eleny Cortez Jan 09, 2018, 4:30 PM

## 2017-12-20 NOTE — Discharge Instructions (Addendum)
Dr. Rod Can Joint Replacement Specialist [facility] [address] [place],  [address] [phone]   TOTAL HIP REPLACEMENT POSTOPERATIVE DIRECTIONS    Hip Rehabilitation, Guidelines Following Surgery   WEIGHT BEARING Weight bearing as tolerated with assist device (walker, cane, etc) as directed, use it as long as suggested by your surgeon or therapist, typically at least 4-6 weeks.  The results of a hip operation are greatly improved after range of motion and muscle strengthening exercises. Follow all safety measures which are given to protect your hip. If any of these exercises cause increased pain or swelling in your joint, decrease the amount until you are comfortable again. Then slowly increase the exercises. Call your caregiver if you have problems or questions.   HOME CARE INSTRUCTIONS  Most of the following instructions are designed to prevent the dislocation of your new hip.  Remove items at home which could result in a fall. This includes throw rugs or furniture in walking pathways.  Continue medications as instructed at time of discharge.  You may have some home medications which will be placed on hold until you complete the course of blood thinner medication.  You may start showering once you are discharged home. Do not put on socks or shoes without following the instructions of your caregivers.   Sit on chairs with arms. Use the chair arms to help push yourself up when arising.  Arrange for the use of a toilet seat elevator so you are not sitting low.   Walk with walker as instructed.  You may resume a sexual relationship in one month or when given the OK by your caregiver.  Use walker as long as suggested by your caregivers.  You may put full weight on your legs and walk as much as is comfortable. Avoid periods of inactivity such as sitting longer than an hour when not asleep. This helps prevent blood clots.  You may return  to work once you are cleared by [employer], [employer].  Do not drive a car for 6 weeks or until released by your surgeon.  Do not drive while taking narcotics.  Wear elastic stockings for two weeks following surgery during the day but you may remove then at night.  Make sure you keep all of your appointments after your operation with all of your doctors and caregivers. You should call the office at the above phone number and make an appointment for approximately two weeks after the date of your surgery. Please pick up a stool softener and laxative for home use as long as you are requiring pain medications.  ICE to the affected hip every three hours for 30 minutes at a time and then as needed for pain and swelling. Continue to use ice on the hip for pain and swelling from surgery. You may notice swelling that will progress down to the foot and ankle.  This is normal after surgery.  Elevate the leg when you are not up walking on it.   It is important for you to complete the blood thinner medication as prescribed by your doctor.  Continue to use the breathing machine which will help keep your temperature down.  It is common for your temperature to cycle up and down following surgery, especially at night when you are not up moving around and exerting yourself.  The breathing machine keeps your lungs expanded and your temperature down.  RANGE OF MOTION AND STRENGTHENING EXERCISES  These exercises are designed to help you  keep full movement of your hip joint. Follow your caregiver's or physical therapist's instructions. Perform all exercises about fifteen times, three times per day or as directed. Exercise both hips, even if you have had only one joint replacement. These exercises can be done on a training (exercise) mat, on the floor, on a table or on a bed. Use whatever works the best and is most comfortable for you. Use music or television while you are exercising so that the exercises are a pleasant break in your  day. This will make your life better with the exercises acting as a break in routine you can look forward to.  Lying on your back, slowly slide your foot toward your buttocks, raising your knee up off the floor. Then slowly slide your foot back down until your leg is straight again.  Lying on your back spread your legs as far apart as you can without causing discomfort.  Lying on your side, raise your upper leg and foot straight up from the floor as far as is comfortable. Slowly lower the leg and repeat.  Lying on your back, tighten up the muscle in the front of your thigh (quadriceps muscles). You can do this by keeping your leg straight and trying to raise your heel off the floor. This helps strengthen the largest muscle supporting your knee.  Lying on your back, tighten up the muscles of your buttocks both with the legs straight and with the knee bent at a comfortable angle while keeping your heel on the floor.   SKILLED REHAB INSTRUCTIONS: If the patient is transferred to a skilled rehab facility following release from the hospital, a list of the current medications will be sent to the facility for the patient to continue.  When discharged from the skilled rehab facility, please have the facility set up the patient's [place] prior to being released. Also, the skilled facility will be responsible for providing the patient with their medications at time of release from the facility to include their pain medication and their blood thinner medication. If the patient is still at the rehab facility at time of the two week follow up appointment, the skilled rehab facility will also need to assist the patient in arranging follow up appointment in our office and any transportation needs.  MAKE SURE YOU:  Understand these instructions.  Will watch your condition.  Will get help right away if you are not doing well or get worse.  Pick up stool softner and laxative for home use following  surgery while on pain medications. Dry dressing change as needed. Continue to use ice for pain and swelling after surgery. Do not use any lotions or creams on the incision until instructed by your surgeon. Total Hip Protocol.   Information on my medicine - ELIQUIS (apixaban)  This medication education was reviewed with me or my healthcare representative as part of my discharge preparation.   Why was Eliquis prescribed for you? Eliquis was prescribed for you to reduce the risk of a blood clot forming that can cause a stroke if you have a medical condition called atrial fibrillation (a type of irregular heartbeat).  What do You need to know about Eliquis ? Take your Eliquis TWICE DAILY - one tablet in the morning and one tablet in the evening with or without food. If you have difficulty swallowing the tablet whole please discuss with your pharmacist how to take the medication safely.  Take Eliquis exactly as prescribed by your  doctor and DO NOT stop taking Eliquis without talking to the doctor who prescribed the medication.  Stopping may increase your risk of developing a stroke.  Refill your prescription before you run out.  After discharge, you should have regular check-up appointments with your healthcare provider that is prescribing your Eliquis.  In the future your dose may need to be changed if your kidney function or weight changes by a significant amount or as you get older.  What do you do if you miss a dose? If you miss a dose, take it as soon as you remember on the same day and resume taking twice daily.  Do not take more than one dose of ELIQUIS at the same time to make up a missed dose.  Important Safety Information A possible side effect of Eliquis is bleeding. You should call your healthcare provider right away if you experience any of the following: ? Bleeding from an injury or your nose that does not stop. ? Unusual colored urine (red or dark brown) or unusual  colored stools (red or black). ? Unusual bruising for unknown reasons. ? A serious fall or if you hit your head (even if there is no bleeding).  Some medicines may interact with Eliquis and might increase your risk of bleeding or clotting while on Eliquis. To help avoid this, consult your healthcare provider or pharmacist prior to using any new prescription or non-prescription medications, including herbals, vitamins, non-steroidal anti-inflammatory drugs (NSAIDs) and supplements.  This website has more information on Eliquis (apixaban): http://www.eliquis.com/eliquis/home

## 2017-12-21 LAB — GLUCOSE, CAPILLARY
GLUCOSE-CAPILLARY: 153 mg/dL — AB (ref 65–99)
GLUCOSE-CAPILLARY: 144 mg/dL — AB (ref 65–99)
GLUCOSE-CAPILLARY: 166 mg/dL — AB (ref 65–99)
Glucose-Capillary: 146 mg/dL — ABNORMAL HIGH (ref 65–99)

## 2017-12-21 MED ORDER — ENSURE ENLIVE PO LIQD
237.0000 mL | Freq: Three times a day (TID) | ORAL | Status: DC
  Administered 2017-12-21 – 2017-12-24 (×6): 237 mL via ORAL

## 2017-12-21 MED ORDER — THIAMINE HCL 100 MG/ML IJ SOLN
500.0000 mg | Freq: Every day | INTRAVENOUS | Status: DC
  Administered 2017-12-22: 500 mg via INTRAVENOUS
  Filled 2017-12-21 (×2): qty 5

## 2017-12-21 MED ORDER — HALOPERIDOL LACTATE 5 MG/ML IJ SOLN: 2.0000 mg | Freq: Once | INTRAMUSCULAR | Status: DC

## 2017-12-21 MED ORDER — HALOPERIDOL LACTATE 5 MG/ML IJ SOLN
5.0000 mg | Freq: Once | INTRAMUSCULAR | Status: AC
  Administered 2017-12-21: 5 mg via INTRAVENOUS
  Filled 2017-12-21: qty 1

## 2017-12-21 NOTE — Progress Notes (Signed)
Initial Nutrition Assessment  DOCUMENTATION CODES:   Not applicable  INTERVENTION:    Ensure Enlive po TID, each supplement provides 350 kcal and 20 grams of protein  NUTRITION DIAGNOSIS:   Inadequate oral intake related to poor appetite as evidenced by meal completion < 50%.  GOAL:   Patient will meet greater than or equal to 90% of their needs  MONITOR:   PO intake, Supplement acceptance, Skin  REASON FOR ASSESSMENT:   LOS    ASSESSMENT:   72 yo male with PMH of HTN, DM, HLD, tobacco use, chronic pain, CAD, STEMI, and arthritis who was admitted on 3/23 s/p fall at home with R hip fracture. S/P R THA 4/1.  Patient refused breakfast and lunch today. He c/o poor appetite and says he just isn't hungry. Overall intake since surgery on 4/1 has been very poor, averaging < 25% meal completion. Labs reviewed. CBG's: [phone] Medications reviewed and include B-12, Colace, Miralax, thiamine.  NUTRITION - FOCUSED PHYSICAL EXAM:  Unable to complete nutrition focused physical exam at this time  Diet Order:  Diet Carb Modified Fluid consistency: Thin; Room service appropriate? Yes  EDUCATION NEEDS:   No education needs have been identified at this time  Skin:  Skin Assessment: Skin Integrity Issues: Skin Integrity Issues:: Incisions Incisions: R hip surgical incision with wound VAC  Last BM:  4/3  Height:   Ht Readings from Last 1 Encounters:  12/09/17 6' (1.829 m)    Weight:   Wt Readings from Last 1 Encounters:  12/20/17 191 lb 12.8 oz (87 kg)    Ideal Body Weight:  80.9 kg  BMI:  Body mass index is 26.01 kg/m.  Estimated Nutritional Needs:   Kcal:  2100-2300  Protein:  115-130 gm  Fluid:  2.1 L    Molli Barrows, RD, LDN, CNSC Pager [phone] After Hours Pager [phone]

## 2017-12-21 NOTE — Progress Notes (Signed)
CSW received call from [facility] at [facility] that patient has received insurance authorization. However, patient will need to remain out of restraints for 24 hours prior to discharge to SNF. CSW alerted MD.  CSW will continue to follow.  Laveda Abbe,  Clinical Social Worker [phone]

## 2017-12-21 NOTE — Plan of Care (Signed)
Soft limb restraints

## 2017-12-21 NOTE — Progress Notes (Signed)
Pt alert and oriented. Soft limb restraints removed at this time. Instructed to not pull on tubes and wires and call prn needs. Call bell in reach. Bed Alarm set.

## 2017-12-21 NOTE — Progress Notes (Signed)
[facility] rehab requesting notes on patient's wound vac to provide to [place], as authorization for rehab is pending. CSW sent updated clinicals with wound vac notes. CSW awaiting [employer] authorization and will follow for discharge planning. Noted patient in restraints.  Estanislado Emms, [place]

## 2017-12-21 NOTE — Progress Notes (Addendum)
Progress Note  Patient Name: Dennis [name] Date of Encounter: 12/21/2017  Primary Cardiologist: Dr. Carlyle Dolly  Subjective   Pt remains acutely confused. Unable to answer questions appropriately. Per tele review, he is now having periods of NSR with rates in the 80's with intermittent Afib with elevated rates, most likely when agitated.  Inpatient Medications    Scheduled Meds: . apixaban  2.5 mg Oral BID  . aspirin EC  81 mg Oral Daily  . atorvastatin  80 mg Oral q1800  . clopidogrel  75 mg Oral Q breakfast  . cyanocobalamin  1,000 mcg Intramuscular Daily  . docusate sodium  100 mg Oral BID  . insulin aspart  0-9 Units Subcutaneous TID WC  . lisinopril  5 mg Oral Daily  . metoprolol tartrate  50 mg Oral TID  . polyethylene glycol  17 g Oral Daily  . senna  1 tablet Oral QHS   Continuous Infusions: . tranexamic acid (CYKLOKAPRON) topical -INTRAOP     PRN Meds: acetaminophen, diazepam, Glycerin (Adult), haloperidol lactate, HYDROcodone-acetaminophen, ketorolac, menthol-cetylpyridinium **OR** phenol, metoCLOPramide **OR** metoCLOPramide (REGLAN) injection, naLOXone (NARCAN)  injection, ondansetron **OR** ondansetron (ZOFRAN) IV   Vital Signs    Vitals:   12/20/17 1233 12/20/17 1428 12/20/17 1626 12/20/17 1932  BP: (!) 100/46 (!) 101/51 (!) 101/51 (!) 108/51  Pulse:  81 74 74  Resp:    14  Temp:   (!) 97.5 F (36.4 C) 98 F (36.7 C)  TempSrc:   Oral Oral  SpO2:    96%  Weight:      Height:        Intake/Output Summary (Last 24 hours) at 12/21/2017 0905 Last data filed at 12/21/2017 9242 Gross per 24 hour  Intake 580 ml  Output 575 ml  Net 5 ml   Filed Weights   12/17/17 0500 12/18/17 0615 12/20/17 0430  Weight: 194 lb 10.7 oz (88.3 kg) 194 lb 3.6 oz (88.1 kg) 191 lb 12.8 oz (87 kg)    Physical Exam   General: Well developed, well nourished, NAD Skin: Warm, dry, intact  Head: Normocephalic, atraumatic, clear, moist mucus membranes. Neck: Negative  for carotid bruits. No JVD Lungs:Clear to ausculation bilaterally. No wheezes, rales, or rhonchi. Breathing is unlabored. Cardiovascular: RRR with S1 S2. No murmurs, rubs or gallops Abdomen: Soft, non-tender, non-distended with normoactive bowel sounds. No obvious abdominal masses. MSK: Strength and tone appear normal for age. 5/5 in all extremities Extremities: No edema. No clubbing or cyanosis. DP/PT pulses 2+ bilaterally Neuro: Confused. No focal deficits. No facial asymmetry. MAE spontaneously. Psych: Does not respond to questions appropriately with normal affect.    Labs    Chemistry Recent Labs  Lab 12/17/17 0338 12/18/17 0703 12/19/17 0355 12/20/17 0347  NA 135 138 138 138  K 3.8 3.8 3.9 3.9  CL 103 109 104 104  CO2 20* 20* 21* 21*  GLUCOSE 219* 143* 179* 164*  BUN 16 25* 29* 27*  CREATININE 1.15 1.19 1.30* 1.21  CALCIUM 8.6* 8.4* 8.2* 8.1*  PROT 6.8 6.2*  --   --   ALBUMIN 2.6* 2.4*  --   --   AST 37 48*  --   --   ALT 41 49  --   --   ALKPHOS 107 97  --   --   BILITOT 1.2 0.8  --   --   GFRNONAA >60 >60 54* 59*  GFRAA >60 >60 >60 >60  ANIONGAP 12 9 13  13  Hematology Recent Labs  Lab 12/18/17 0703 12/19/17 0355 12/20/17 0347  WBC 21.7* 15.5* 17.6*  RBC 3.57* 3.06* 2.83*  HGB 10.5* 8.6* 8.1*  HCT 32.0* 27.5* 25.2*  MCV 89.6 89.9 89.0  MCH 29.4 28.1 28.6  MCHC 32.8 31.3 32.1  RDW 14.6 14.3 14.4  PLT 327 328 354    Cardiac EnzymesNo results for input(s): TROPONINI in the last 168 hours. No results for input(s): TROPIPOC in the last 168 hours.   BNPNo results for input(s): BNP, PROBNP in the last 168 hours.   DDimer No results for input(s): DDIMER in the last 168 hours.   Radiology    No results found.  Telemetry    12/21/17 NSR with intermittent Afib with elevated rates - Personally Reviewed  ECG    No new tracings as of 12/21/17 - Personally Reviewed  Cardiac Studies   Cardiac cath 11/13/2017 Conclusion     Mid RCA lesion is  100% stenosed.  Dist RCA lesion is 40% stenosed.  Prox RCA lesion is 50% stenosed.  Ost RPDA to RPDA lesion is 99% stenosed.  Prox LAD to Mid LAD lesion is 60% stenosed.  Ost 1st Diag lesion is 90% stenosed.  Ost 2nd Diag to 2nd Diag lesion is 85% stenosed.  Ost 2nd Mrg to 2nd Mrg lesion is 40% stenosed.  Ost Cx to Prox Cx lesion is 40% stenosed.  Ost 1st Mrg to 1st Mrg lesion is 70% stenosed.  The left ventricular systolic function is normal.  LV end diastolic pressure is mildly elevated.  The left ventricular ejection fraction is 55-65% by visual estimate.  Post intervention, there is a 0% residual stenosis.  A drug-eluting stent was successfully placed using a STENT SYNERGY DES 2.25X28.  Post intervention, there is a 0% residual stenosis.  A drug-eluting stent was successfully placed using a STENT SYNERGY DES 3X20.  1. 3 vessel obstructive CAD. - diffuse 60% proximal to mid LAD with severe disease in small diagonal branches. - 70% OM1. Stent in OM 2 is diffusely diseased but patent - 100% mid RCA and 99% PDA 2. Good LV function 3. Mildly elevated LVEDP 4. Successful stenting of the PDA and mid RCA with DES x 2. Diffusely diseased and heavily calcified RCA.  Plan: DAPT indefinitely. Aggressive risk factor modification and smoking cessation.     2D echocardiogram 11/14/2017 Study Conclusions  - Left ventricle: The cavity size was normal. Systolic function was normal. The estimated ejection fraction was in the range of 55% to 60%. Possible mild hypokinesis of the basal-midinferior myocardium. - Left atrium: The atrium was mildly dilated  Patient Profile     72 y.o. male with hx of CAD with recent PCI,DM, hypertension, HLD, tobacco abusewho presented with right hip fracture and seen for pre-op clearance.  Post-op course has been complicated with acute confusion and delirium.  Assessment & Plan    1.New atrial fibrillation with rapid  ventricular response: -Currently in NSR with periods of Afib with elevated rates -Metoprolol increased yesterday to 50 mg TID -TSH, 1.9 -Low dose Eliquis 2.5mg  started yesterday>>>can transition to full dose per surgery and pharmacy teams on 12/22/17 -Plavix was restarted 12/20/17 -There are plans for possible transfer to nursing facility in [place] when medically ready>>I do not feel that the pt is currently ready for transfer to NH given his acute delirium and difficult to manage HR -[id number] =3  2. Right hip fracture: -s/p arthroscopy of right hip 12/18/17  -Per ortho  3. Acute delirium: -Remains  acutely confused today  -WBC slightly worse from 15.5 12/19/17>>>17.6 12/20/17 -Head CT was negative for any acute intracranial abnormality except for left-sided dislocated lens. -Perprimary team   4. CAD s/p PCI 10/2017: -S/pstenting of the PDA and mid RCA with DES x 2  -Plan was for DAPT with aggressive risk factor modification -Brilinta on hold for hip surgery, will need to transition to plavix given plan for [facility] with his new onset Afib -Continue statin, aspirin,beta-blocker  5. Anemia:  -Post op decrease>>8.1 12/20/17   Signed, Kathyrn Drown NP-C [facility] Pager: [phone] 12/21/2017, 9:05 AM     For questions or updates, please contact   Please consult [email or web address] for contact info under Cardiology/STEMI.  I have personally seen and examined this patient. I agree with the assessment and plan as outlined above.  He is confused this am. From a cardiac standpoint, will continue Plavix and will plan to use Eliquis 2.5 mg po BID until tomorrow when he can be changed to 5 mg po BID. No new recs today.   Lauree Chandler  12/21/2017 11:13 AM
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
## 2017-12-21 NOTE — Plan of Care (Signed)
  Problem: Activity: Goal: Risk for activity intolerance will decrease Outcome: Progressing  Pt transfers from bed to Dennis [name] with two assist and front wheel walker.

## 2017-12-21 NOTE — Plan of Care (Signed)
Adjusting cardiac meds to control HR.

## 2017-12-21 NOTE — Progress Notes (Signed)
Earlier this shift patient had pulled out his IV, took gown off and refused to wear a new gown, did allow staff to cover him up with blankets.  Patient had also pulled off wound vac dressing to hip and had thrown it in chair beside bed. Haldol 5mg  IVP given per PRN orders at 0031.  RN able to get new wound vac dressing from OR and new dressing applied.  Prior to applying new dressing incision cleansed with sterile water and pat dry with gauze.  About 17 RN in room next door and patients primary RN requesting help.  This RN entered room and patient had thrown water cup on the floor, had thrown all blankets onto floor.  Patient had heart monitor cords in hand and was swinging it around like a lasso.  RN asked patient if he would drop the heart monitor cords and patient replied no and continued to swing the heart monitor cords like a lasso.  Patient also stating he wanted to "blow our heads off" (directing comment to nurses and tech who were present at bedside).  RN attempted to re-orient patient explaining to patient that he was in the hospital.  Patient stated he was not at the hospital he was at home.  Security called and came to bedside accompanied by [facility].  [place] Engineer, structural and patient had a conversation about patient being in [place] currently and patient continued to state he was in [place].  Patient released heart monitor cords to tech.  [facility] paged to update, order received for bilateral soft wrist restraints.  Bilateral soft wrist restraints applied.  Will continue to monitor.

## 2017-12-22 DIAGNOSIS — R338 Other retention of urine: Secondary | ICD-10-CM

## 2017-12-22 DIAGNOSIS — D62 Acute posthemorrhagic anemia: Secondary | ICD-10-CM

## 2017-12-22 DIAGNOSIS — I480 Paroxysmal atrial fibrillation: Secondary | ICD-10-CM

## 2017-12-22 LAB — TYPE AND SCREEN
ABO/RH(D): O POS
ANTIBODY SCREEN: POSITIVE
DONOR AG TYPE: NEGATIVE
Donor AG Type: NEGATIVE
PT AG Type: NEGATIVE
UNIT DIVISION: 0
UNIT DIVISION: 0

## 2017-12-22 LAB — CBC WITH DIFFERENTIAL/PLATELET
Basophils Absolute: 0.1 10*3/uL (ref 0.0–0.1)
Basophils Relative: 1 %
EOS ABS: 0.4 10*3/uL (ref 0.0–0.7)
EOS PCT: 3 %
HCT: 23.8 % — ABNORMAL LOW (ref 39.0–52.0)
HEMOGLOBIN: 7.6 g/dL — AB (ref 13.0–17.0)
LYMPHS PCT: 23 %
Lymphs Abs: 3.3 10*3/uL (ref 0.7–4.0)
MCH: 28.7 pg (ref 26.0–34.0)
MCHC: 31.9 g/dL (ref 30.0–36.0)
MCV: 89.8 fL (ref 78.0–100.0)
MONO ABS: 1.1 10*3/uL — AB (ref 0.1–1.0)
Monocytes Relative: 8 %
NEUTROS PCT: 65 %
Neutro Abs: 9.4 10*3/uL — ABNORMAL HIGH (ref 1.7–7.7)
Platelets: 372 10*3/uL (ref 150–400)
RBC: 2.65 MIL/uL — AB (ref 4.22–5.81)
RDW: 14.6 % (ref 11.5–15.5)
WBC: 14.3 10*3/uL — AB (ref 4.0–10.5)

## 2017-12-22 LAB — BASIC METABOLIC PANEL
ANION GAP: 9 (ref 5–15)
BUN: 25 mg/dL — ABNORMAL HIGH (ref 6–20)
CO2: 23 mmol/L (ref 22–32)
CREATININE: 1.09 mg/dL (ref 0.61–1.24)
Calcium: 8.1 mg/dL — ABNORMAL LOW (ref 8.9–10.3)
Chloride: 106 mmol/L (ref 101–111)
GFR calc non Af Amer: >60 mL/min (ref >60)
Glucose, Bld: 174 mg/dL — ABNORMAL HIGH (ref 65–99)
POTASSIUM: 3.5 mmol/L (ref 3.5–5.1)
SODIUM: 138 mmol/L (ref 135–145)

## 2017-12-22 LAB — BPAM RBC
BLOOD PRODUCT EXPIRATION DATE: 201904112359
BLOOD PRODUCT EXPIRATION DATE: 201904162359
ISSUE DATE / TIME: 201903181703
UNIT TYPE AND RH: 5100
Unit Type and Rh: 5100

## 2017-12-22 LAB — GLUCOSE, CAPILLARY
GLUCOSE-CAPILLARY: 218 mg/dL — AB (ref 65–99)
GLUCOSE-CAPILLARY: 164 mg/dL — AB (ref 65–99)
Glucose-Capillary: 160 mg/dL — ABNORMAL HIGH (ref 65–99)
Glucose-Capillary: 180 mg/dL — ABNORMAL HIGH (ref 65–99)
Glucose-Capillary: 176 mg/dL — ABNORMAL HIGH (ref 65–99)

## 2017-12-22 LAB — MAGNESIUM: MAGNESIUM: 2.3 mg/dL (ref 1.7–2.4)

## 2017-12-22 MED ORDER — THIAMINE HCL 100 MG/ML IJ SOLN
500.0000 mg | Freq: Every day | INTRAMUSCULAR | Status: DC
  Administered 2017-12-24: 500 mg via INTRAVENOUS
  Filled 2017-12-22 (×2): qty 5

## 2017-12-22 MED ORDER — HALOPERIDOL LACTATE 5 MG/ML IJ SOLN
2.0000 mg | Freq: Two times a day (BID) | INTRAMUSCULAR | Status: DC | PRN
  Administered 2017-12-23 – 2017-12-24 (×2): 2 mg via INTRAVENOUS
  Filled 2017-12-22 (×2): qty 1
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
## 2017-12-22 NOTE — Progress Notes (Addendum)
Progress Note  Patient Name: Dennis [name] Date of Encounter: 12/22/2017  Primary Cardiologist: Carlyle Dolly, MD   Subjective   Lethargic since got Toradol this AM at 6. Family is concerned about decreased urine output and no BM. Passing gas.   Inpatient Medications    Scheduled Meds: . apixaban  2.5 mg Oral BID  . aspirin EC  81 mg Oral Daily  . atorvastatin  80 mg Oral q1800  . clopidogrel  75 mg Oral Q breakfast  . cyanocobalamin  1,000 mcg Intramuscular Daily  . docusate sodium  100 mg Oral BID  . feeding supplement (ENSURE ENLIVE)  237 mL Oral TID BM  . insulin aspart  0-9 Units Subcutaneous TID WC  . lisinopril  5 mg Oral Daily  . metoprolol tartrate  50 mg Oral TID  . polyethylene glycol  17 g Oral Daily  . senna  1 tablet Oral QHS   Continuous Infusions: . thiamine injection    . tranexamic acid (CYKLOKAPRON) topical -INTRAOP     PRN Meds: acetaminophen, diazepam, Glycerin (Adult), haloperidol lactate, HYDROcodone-acetaminophen, ketorolac, menthol-cetylpyridinium **OR** phenol, metoCLOPramide **OR** metoCLOPramide (REGLAN) injection, naLOXone (NARCAN)  injection, ondansetron **OR** ondansetron (ZOFRAN) IV   Vital Signs    Vitals:   12/21/17 0758 12/21/17 1444 12/21/17 1954 12/22/17 0434  BP:  (!) 113/59 129/69 (!) 103/48  Pulse:  74 71 73  Resp: 19 16 16 15   Temp:   97.7 F (36.5 C) (!) 97.5 F (36.4 C)  TempSrc:   Oral Oral  SpO2:  98% 100% 98%  Weight:    187 lb 3.2 oz (84.9 kg)  Height:        Intake/Output Summary (Last 24 hours) at 12/22/2017 0845 Last data filed at 12/21/2017 2234 Gross per 24 hour  Intake 357 ml  Output 175 ml  Net 182 ml   Filed Weights   12/18/17 0615 12/20/17 0430 12/22/17 0434  Weight: 194 lb 3.6 oz (88.1 kg) 191 lb 12.8 oz (87 kg) 187 lb 3.2 oz (84.9 kg)    Telemetry    SR - Personally Reviewed  ECG    N./A  Physical Exam   GEN: lethergic ill appearing male in No acute distress.   Neck: No JVD Cardiac:  RRR, no murmurs, rubs, or gallops.  Respiratory: Clear to auscultation bilaterally. GI: Soft, nontender, non-distended  MS: No edema; No deformity. Neuro:  lethergic Psych: lethargic and sleepy  Labs    Chemistry Recent Labs  Lab 12/17/17 0338 12/18/17 0703 12/19/17 0355 12/20/17 0347 12/22/17 0433  NA 135 138 138 138 138  K 3.8 3.8 3.9 3.9 3.5  CL 103 109 104 104 106  CO2 20* 20* 21* 21* 23  GLUCOSE 219* 143* 179* 164* 174*  BUN 16 25* 29* 27* 25*  CREATININE 1.15 1.19 1.30* 1.21 1.09  CALCIUM 8.6* 8.4* 8.2* 8.1* 8.1*  PROT 6.8 6.2*  --   --   --   ALBUMIN 2.6* 2.4*  --   --   --   AST 37 48*  --   --   --   ALT 41 49  --   --   --   ALKPHOS 107 97  --   --   --   BILITOT 1.2 0.8  --   --   --   GFRNONAA >60 >60 54* 59* >60  GFRAA >60 >60 >60 >60 >60  ANIONGAP 12 9 13 13 9      Hematology Recent Labs  Lab 12/19/17 0355 12/20/17 0347 12/22/17 0433  WBC 15.5* 17.6* 14.3*  RBC 3.06* 2.83* 2.65*  HGB 8.6* 8.1* 7.6*  HCT 27.5* 25.2* 23.8*  MCV 89.9 89.0 89.8  MCH 28.1 28.6 28.7  MCHC 31.3 32.1 31.9  RDW 14.3 14.4 14.6  PLT 328 354 372    Radiology    No results found.  Cardiac Studies   Cardiac cath 11/13/2017 Conclusion     Mid RCA lesion is 100% stenosed.  Dist RCA lesion is 40% stenosed.  Prox RCA lesion is 50% stenosed.  Ost RPDA to RPDA lesion is 99% stenosed.  Prox LAD to Mid LAD lesion is 60% stenosed.  Ost 1st Diag lesion is 90% stenosed.  Ost 2nd Diag to 2nd Diag lesion is 85% stenosed.  Ost 2nd Mrg to 2nd Mrg lesion is 40% stenosed.  Ost Cx to Prox Cx lesion is 40% stenosed.  Ost 1st Mrg to 1st Mrg lesion is 70% stenosed.  The left ventricular systolic function is normal.  LV end diastolic pressure is mildly elevated.  The left ventricular ejection fraction is 55-65% by visual estimate.  Post intervention, there is a 0% residual stenosis.  A drug-eluting stent was successfully placed using a STENT SYNERGY DES  2.25X28.  Post intervention, there is a 0% residual stenosis.  A drug-eluting stent was successfully placed using a STENT SYNERGY DES 3X20.  1. 3 vessel obstructive CAD. - diffuse 60% proximal to mid LAD with severe disease in small diagonal branches. - 70% OM1. Stent in OM 2 is diffusely diseased but patent - 100% mid RCA and 99% PDA 2. Good LV function 3. Mildly elevated LVEDP 4. Successful stenting of the PDA and mid RCA with DES x 2. Diffusely diseased and heavily calcified RCA.  Plan: DAPT indefinitely. Aggressive risk factor modification and smoking cessation.     2D echocardiogram 11/14/2017 Study Conclusions  - Left ventricle: The cavity size was normal. Systolic function was normal. The estimated ejection fraction was in the range of 55% to 60%. Possible mild hypokinesis of the basal-midinferior myocardium. - Left atrium: The atrium was mildly dilated  Patient Profile     y.o. male with hx of CAD with recent PCI,DM, hypertension, HLD, tobacco abusewho presented with right hip fracture and seen for pre-op clearance.  Post-op course has been complicated with acute confusion and delirium  Assessment & Plan    1. New onset Afib with RVR - maintaining sinus rhythm on high dose of metoprolol. May consider further up titration and reducing ACE as needed for additional rate control.  - CHA2DS2VASc =3. Low dose Eliquis 2.5mg  started 4/3>>>can transition to full dose per surgery however Hgb down trending and no BM.   2. Acute delirium - WBC down trend. Per primary team.   3. Anemia - Hgb down now to 7.6 from >10. No BM. Further management per primary team.  4. CAD s/p recent stenting - Continue Aspirin, Plavix and statin. MD to comment on triple therapy.   For questions or updates, please contact [place] Please consult [email or web address] for contact info under Cardiology/STEMI.      SignedLeanor Kail, PA  12/22/2017, 8:45 AM    I  have personally seen and examined this patient. I agree with the assessment and plan as outlined above.  He is now on ASA, Plavix and Eliquis. I think we can stop his ASA given his anemia. Will continue Plavix and Eliquis since he has no signs of bleeding. I would not increase  his dose of Eliquis today but if his H/H remains stable, can increase Eliquis to 5 mg po BID this weekend. His anemia may be from operative blood losses. Will continue to follow with you.   Lauree Chandler 12/22/2017 9:15 AM

## 2017-12-22 NOTE — Progress Notes (Signed)
    Subjective:  Patient reports pain as mild to moderate.  Denies N/V/CP/SOB.   Objective:   VITALS:   Vitals:   12/21/17 1954 12/22/17 0434 12/22/17 1109 12/22/17 1110  BP: 129/69 (!) 103/48 123/64   Pulse: 71 73  79  Resp: 16 15    Temp: 97.7 F (36.5 C) (!) 97.5 F (36.4 C)    TempSrc: Oral Oral    SpO2: 100% 98%    Weight:  84.9 kg (187 lb 3.2 oz)    Height:        NAD AMS, more appropriate today Sitting up in chair ABD soft Intact pulses distally Incision: dressing C/D/I Compartment soft Prevena intact Chronic equinus deformity Wiggles toes Unable to full assess sensation due to AMS  Lab Results  Component Value Date   WBC 14.3 (H) 12/22/2017   HGB 7.6 (L) 12/22/2017   HCT 23.8 (L) 12/22/2017   MCV 89.8 12/22/2017   PLT 372 12/22/2017   BMET    Component Value Date/Time   NA 138 12/22/2017 0433   K 3.5 12/22/2017 0433   CL 106 12/22/2017 0433   CO2 23 12/22/2017 0433   GLUCOSE 174 (H) 12/22/2017 0433   BUN 25 (H) 12/22/2017 0433   CREATININE 1.09 12/22/2017 0433   CALCIUM 8.1 (L) 12/22/2017 0433   GFRNONAA >60 12/22/2017 0433   GFRAA >60 12/22/2017 0433     Assessment/Plan: 4 Days Post-Op   Principal Problem:   Delirium Active Problems:   CAD in native artery   Closed right hip fracture (HCC)   New onset a-fib (HCC)   Atrial fibrillation with RVR (HCC)   WBAT with walker DVT ppx: eliquis & plavix, SCDs, TEDS PO pain control PT/OT Dispo: SNF placement, convert VAC to portable unit upon d/c   Hilton Cork Bonetta Mostek 12/22/2017, 12:17 PM   Rod Can, MD Cell [phone]
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
## 2017-12-22 NOTE — Progress Notes (Signed)
In and out cath perfromed, yield 700 cc of amber urine.  Ferdinand Lango, RN

## 2017-12-23 LAB — CBC
HCT: 24.7 % — ABNORMAL LOW (ref 39.0–52.0)
Hemoglobin: 8 g/dL — ABNORMAL LOW (ref 13.0–17.0)
MCH: 29.4 pg (ref 26.0–34.0)
MCHC: 32.4 g/dL (ref 30.0–36.0)
MCV: 90.8 fL (ref 78.0–100.0)
PLATELETS: 402 10*3/uL — AB (ref 150–400)
RBC: 2.72 MIL/uL — ABNORMAL LOW (ref 4.22–5.81)
RDW: 14.7 % (ref 11.5–15.5)
WBC: 14.3 10*3/uL — AB (ref 4.0–10.5)

## 2017-12-23 LAB — GLUCOSE, CAPILLARY
GLUCOSE-CAPILLARY: 192 mg/dL — AB (ref 65–99)
GLUCOSE-CAPILLARY: 197 mg/dL — AB (ref 65–99)
Glucose-Capillary: 227 mg/dL — ABNORMAL HIGH (ref 65–99)
Glucose-Capillary: 190 mg/dL — ABNORMAL HIGH (ref 65–99)

## 2017-12-23 LAB — BASIC METABOLIC PANEL
Anion gap: 9 (ref 5–15)
BUN: 23 mg/dL — AB (ref 6–20)
CO2: 23 mmol/L (ref 22–32)
CREATININE: 1.17 mg/dL (ref 0.61–1.24)
Calcium: 8.2 mg/dL — ABNORMAL LOW (ref 8.9–10.3)
Chloride: 107 mmol/L (ref 101–111)
GFR calc Af Amer: >60 mL/min (ref >60)
Glucose, Bld: 191 mg/dL — ABNORMAL HIGH (ref 65–99)
Potassium: 3.8 mmol/L (ref 3.5–5.1)
SODIUM: 139 mmol/L (ref 135–145)

## 2017-12-23 MED ORDER — VITAMIN B-12 1000 MCG PO TABS
1000.0000 ug | ORAL_TABLET | Freq: Every day | ORAL | Status: DC
  Administered 2017-12-23 – 2017-12-25 (×3): 1000 ug via ORAL
  Filled 2017-12-23 (×3): qty 1
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
## 2017-12-23 NOTE — Progress Notes (Signed)
Patient still unable to void. Bladder scan > 560. I & O cath done with 450 cc amber urine.

## 2017-12-23 NOTE — Progress Notes (Signed)
Patient c/o 10/10 right hip pain and requested for Hydrocodone; he refused Tylenol.  He became agitated a few minutes later, pulled condom cath off, and attempted to pull out the wound vac.  PRN Haldol given at 0516 he slept for about an hour and when he woke up he became extremely agitated. He pulled his tele monitor off, IV off, and wound vac. He is convinced someone is trying to poison him stating "you want to poison me, you have been trying to poison me". Reorientation was not effective.  He is currently in bed with eyes closed. Will continue to monitor patient.

## 2017-12-23 NOTE — Progress Notes (Signed)
Progress Note  Patient Name: Dennis [name] Date of Encounter: 12/23/2017  Primary Cardiologist: Carlyle Dolly, MD   Subjective   Dennis [name] is a 72 y.o. male with a hx of CAD, s/p recent PCI who is being seen today for pre op cardiac clearance at the request of Dr Stann Mainland  He was admitted with a hip fracture.   He did well with surgery but developed  post op paroxysmal atrial fib .  He has converted to NSR  - started on low dose Eliquis. He remains very confused.   Inpatient Medications    Scheduled Meds: . apixaban  2.5 mg Oral BID  . atorvastatin  80 mg Oral q1800  . clopidogrel  75 mg Oral Q breakfast  . cyanocobalamin  1,000 mcg Intramuscular Daily  . docusate sodium  100 mg Oral BID  . feeding supplement (ENSURE ENLIVE)  237 mL Oral TID BM  . insulin aspart  0-9 Units Subcutaneous TID WC  . lisinopril  5 mg Oral Daily  . metoprolol tartrate  50 mg Oral TID  . polyethylene glycol  17 g Oral Daily  . senna  1 tablet Oral QHS   Continuous Infusions: . thiamine injection     PRN Meds: acetaminophen, diazepam, Glycerin (Adult), haloperidol lactate, HYDROcodone-acetaminophen, menthol-cetylpyridinium **OR** phenol, naLOXone (NARCAN)  injection, ondansetron **OR** ondansetron (ZOFRAN) IV   Vital Signs    Vitals:   12/23/17 0533 12/23/17 0730 12/23/17 0820 12/23/17 0833  BP: 125/67 127/66  127/66  Pulse: 70  87 (!) 106  Resp:      Temp: 97.8 F (36.6 C)  98.7 F (37.1 C)   TempSrc: Oral  Oral   SpO2: 97%  99%   Weight:      Height:        Intake/Output Summary (Last 24 hours) at 12/23/2017 1037 Last data filed at 12/23/2017 0458 Gross per 24 hour  Intake 120 ml  Output 1150 ml  Net -1030 ml   Filed Weights   12/20/17 0430 12/22/17 0434 12/23/17 0528  Weight: 191 lb 12.8 oz (87 kg) 187 lb 3.2 oz (84.9 kg) 190 lb 4.1 oz (86.3 kg)    Telemetry    NSR  - Personally Reviewed  ECG     NSR  - Personally Reviewed  Physical Exam   GEN: No acute  distress.   Neck: No JVD Cardiac: RRR, no murmurs, rubs, or gallops.  Respiratory: Clear to auscultation bilaterally. GI: Soft, nontender, non-distended  MS: No edema; No deformity. Neuro:  generally weak  Psych: very confused.    Had to orient him to the fact that he was at [name]  Lab 12/17/17 [phone] 12/18/17 0703  12/20/17 0347 12/22/17 0433 12/23/17 0309  NA 135 138   < > 138 138 139  K 3.8 3.8   < > 3.9 3.5 3.8  CL 103 109   < > 104 106 107  CO2 20* 20*   < > 21* 23 23  GLUCOSE 219* 143*   < > 164* 174* 191*  BUN 16 25*   < > 27* 25* 23*  CREATININE 1.15 1.19   < > 1.21 1.09 1.17  CALCIUM 8.6* 8.4*   < > 8.1* 8.1* 8.2*  PROT 6.8 6.2*  --   --   --   --   ALBUMIN 2.6* 2.4*  --   --   --   --  AST 37 48*  --   --   --   --   ALT 41 49  --   --   --   --   ALKPHOS 107 97  --   --   --   --   BILITOT 1.2 0.8  --   --   --   --   GFRNONAA >60 >60   < > 59* >60 >60  GFRAA >60 >60   < > >60 >60 >60  ANIONGAP 12 9   < > 13 9 9    < > = values in this interval not displayed.     Hematology Recent Labs  Lab 12/20/17 0347 12/22/17 0433 12/23/17 0309  WBC 17.6* 14.3* 14.3*  RBC 2.83* 2.65* 2.72*  HGB 8.1* 7.6* 8.0*  HCT 25.2* 23.8* 24.7*  MCV 89.0 89.8 90.8  MCH 28.6 28.7 29.4  MCHC 32.1 31.9 32.4  RDW 14.4 14.6 14.7  PLT 354 372 402*    Cardiac EnzymesNo results for input(s): TROPONINI in the last 168 hours. No results for input(s): TROPIPOC in the last 168 hours.   BNPNo results for input(s): BNP, PROBNP in the last 168 hours.   DDimer No results for input(s): DDIMER in the last 168 hours.   Radiology    No results found.  Cardiac Studies     Patient Profile     72 y.o. male with hip surgery and post op AF  Assessment & Plan    1.   Atrial fib:   Has post op Afib following right hip surgery . Back in NSR . Has been started on Eliquis.   Was initially started on low dose due to anemia  and fear of continued bleeding Hb appears to be stable.   Will continue low dose Eliquis tomorrow  As long as Hb does not fall  2,  Lethargy:   Has post op lethargy.   Had to orient him. Further plans per Int. Medicine team .    For questions or updates, please contact [place] Please consult [email or web address] for contact info under Cardiology/STEMI.      Signed, Mertie Moores, MD  12/23/2017, 10:37 AM

## 2017-12-23 NOTE — Progress Notes (Signed)
In and out cath performed, yield 500 cc of amber urine.  Ferdinand Lango, RN

## 2017-12-24 LAB — CBC
HCT: 24.2 % — ABNORMAL LOW (ref 39.0–52.0)
HEMOGLOBIN: 7.7 g/dL — AB (ref 13.0–17.0)
MCH: 28.5 pg (ref 26.0–34.0)
MCHC: 31.8 g/dL (ref 30.0–36.0)
MCV: 89.6 fL (ref 78.0–100.0)
Platelets: 368 10*3/uL (ref 150–400)
RBC: 2.7 MIL/uL — ABNORMAL LOW (ref 4.22–5.81)
RDW: 14.9 % (ref 11.5–15.5)
WBC: 13 10*3/uL — ABNORMAL HIGH (ref 4.0–10.5)

## 2017-12-24 LAB — GLUCOSE, CAPILLARY
GLUCOSE-CAPILLARY: 196 mg/dL — AB (ref 65–99)
Glucose-Capillary: 148 mg/dL — ABNORMAL HIGH (ref 65–99)
Glucose-Capillary: 162 mg/dL — ABNORMAL HIGH (ref 65–99)
Glucose-Capillary: 131 mg/dL — ABNORMAL HIGH (ref 65–99)

## 2017-12-24 MED ORDER — HYDROCODONE-ACETAMINOPHEN 5-325 MG PO TABS
1.0000 | ORAL_TABLET | Freq: Four times a day (QID) | ORAL | Status: DC | PRN
  Administered 2017-12-25: 1 via ORAL
  Filled 2017-12-24: qty 1

## 2017-12-24 MED ORDER — DIAZEPAM 5 MG PO TABS: 10.0000 mg | ORAL_TABLET | Freq: Every evening | ORAL | Status: DC | PRN

## 2017-12-24 MED ORDER — DIAZEPAM 5 MG PO TABS
10.0000 mg | ORAL_TABLET | Freq: Every evening | ORAL | Status: DC | PRN
Start: 2017-12-24 — End: 2017-12-24

## 2017-12-24 NOTE — Progress Notes (Signed)
Progress Note  Patient Name: Dennis [name] Date of Encounter: 12/24/2017  Primary Cardiologist: Carlyle Dolly, MD   Subjective   Dennis [name] is a 72 y.o. male with a hx of CAD, s/p recent PCI who is being seen today for pre op cardiac clearance at the request of Dr Stann Mainland  He was admitted with a hip fracture.   He did well with surgery but developed  post op paroxysmal atrial fib .  He has converted to NSR  - started on low dose Eliquis. Confusion has improved today.Marland Kitchen   Hb  is back down to 7.7.  It was 8.0 yesterday.  Inpatient Medications    Scheduled Meds: . apixaban  2.5 mg Oral BID  . atorvastatin  80 mg Oral q1800  . clopidogrel  75 mg Oral Q breakfast  . docusate sodium  100 mg Oral BID  . feeding supplement (ENSURE ENLIVE)  237 mL Oral TID BM  . insulin aspart  0-9 Units Subcutaneous TID WC  . lisinopril  5 mg Oral Daily  . metoprolol tartrate  50 mg Oral TID  . polyethylene glycol  17 g Oral Daily  . senna  1 tablet Oral QHS  . vitamin B-12  1,000 mcg Oral Daily   Continuous Infusions: . thiamine injection     PRN Meds: acetaminophen, diazepam, Glycerin (Adult), haloperidol lactate, HYDROcodone-acetaminophen, menthol-cetylpyridinium **OR** phenol, naLOXone (NARCAN)  injection, ondansetron **OR** ondansetron (ZOFRAN) IV   Vital Signs    Vitals:   12/23/17 1955 12/23/17 2359 12/24/17 0410 12/24/17 0946  BP: (!) 110/59 104/60 (!) 109/56 117/66  Pulse: 71 67 70 81  Resp: 15 15 14    Temp: 98.3 F (36.8 C) 98.1 F (36.7 C) 98.3 F (36.8 C)   TempSrc: Oral Oral Oral   SpO2: 98% 97% 98%   Weight:   185 lb 10 oz (84.2 kg)   Height:        Intake/Output Summary (Last 24 hours) at 12/24/2017 1018 Last data filed at 12/24/2017 0214 Gross per 24 hour  Intake -  Output 850 ml  Net -850 ml   Filed Weights   12/22/17 0434 12/23/17 0528 12/24/17 0410  Weight: 187 lb 3.2 oz (84.9 kg) 190 lb 4.1 oz (86.3 kg) 185 lb 10 oz (84.2 kg)    Telemetry    NSR   - Personally Reviewed  ECG     NSR  - Personally Reviewed  Physical Exam   GEN: No acute distress.   Neck: No JVD Cardiac: RRR, no murmurs, rubs, or gallops.  Respiratory: Clear to auscultation bilaterally. GI: Soft, nontender, non-distended  MS: No edema; No deformity. Neuro:  generally weak  Psych: Confusion has improved.     Labs    Chemistry Recent Labs  Lab 12/18/17 0703  12/20/17 0347 12/22/17 0433 12/23/17 0309  NA 138   < > 138 138 139  K 3.8   < > 3.9 3.5 3.8  CL 109   < > 104 106 107  CO2 20*   < > 21* 23 23  GLUCOSE 143*   < > 164* 174* 191*  BUN 25*   < > 27* 25* 23*  CREATININE 1.19   < > 1.21 1.09 1.17  CALCIUM 8.4*   < > 8.1* 8.1* 8.2*  PROT 6.2*  --   --   --   --   ALBUMIN 2.4*  --   --   --   --   AST 48*  --   --   --   --  ALT 49  --   --   --   --   ALKPHOS 97  --   --   --   --   BILITOT 0.8  --   --   --   --   GFRNONAA >60   < > 59* >60 >60  GFRAA >60   < > >60 >60 >60  ANIONGAP 9   < > 13 9 9    < > = values in this interval not displayed.     Hematology Recent Labs  Lab 12/22/17 0433 12/23/17 0309 12/24/17 0420  WBC 14.3* 14.3* 13.0*  RBC 2.65* 2.72* 2.70*  HGB 7.6* 8.0* 7.7*  HCT 23.8* 24.7* 24.2*  MCV 89.8 90.8 89.6  MCH 28.7 29.4 28.5  MCHC 31.9 32.4 31.8  RDW 14.6 14.7 14.9  PLT 372 402* 368    Cardiac EnzymesNo results for input(s): TROPONINI in the last 168 hours. No results for input(s): TROPIPOC in the last 168 hours.   BNPNo results for input(s): BNP, PROBNP in the last 168 hours.   DDimer No results for input(s): DDIMER in the last 168 hours.   Radiology    No results found.  Cardiac Studies     Patient Profile     72 y.o. male with hip surgery and post op AF  Assessment & Plan    1.   Atrial fib:   Has post op Afib following right hip surgery . Back in NSR . Has been started on Eliquis.   I would continue Eliquis 2.5 mg twice a day for now.  IMA globin has dropped slightly.  I would increase  that Eliquis up to 5 mg twice a day tomorrow if the hemoglobin remains stable or increases.  2,  Lethargy:   Has post op lethargy.   This is been present since his hip surgery.  He seems a little bit better today.  Further plans per internal medicine.   For questions or updates, please contact [place] Please consult [email or web address] for contact info under Cardiology/STEMI.      Signed, Mertie Moores, MD  12/24/2017, 10:18 AM

## 2017-12-24 NOTE — Consult Note (Signed)
[facility] Consult   Reason for Consult: ''ongoing agitation and paranoia'' Referring Physician:  Dr. Algis Liming Patient Identification: Dennis [name] MRN:  [id number] Principal Diagnosis: Delirium Diagnosis:   Patient Active Problem List   Diagnosis Date Noted  . Atrial fibrillation with RVR ([place]) [I48.91]   . New onset a-fib ([place]) [I48.91] 12/15/2017  . Delirium [R41.0]   . Closed right hip fracture ([place]) [S72.001A] 12/09/2017  . CAD in native artery [I25.10] 12/04/2017  . Hypertension [I10] 12/04/2017  . Hyperlipidemia [E78.5] 11/15/2017  . STEMI involving oth coronary artery of inferior wall ([place]) [I21.19] 11/13/2017  . Tobacco abuse [Z72.0] 11/13/2017  . STEMI involving right coronary artery ([place]) [I21.11] 11/13/2017  . STEMI (ST elevation myocardial infarction) ([place]) [I21.3] 11/13/2017  . Diabetes mellitus due to underlying condition, uncontrolled ([place]) [E08.65] 04/07/2009    Total Time spent with patient: 45 minutes  Subjective:   Dennis [name] is a 72 y.o. male patient admitted with right hip fracture.  HPI:   Patient seen in his room, chart reviewed and case discussed with Dr. Algis Liming. Per chart, patient was  transferred to [facility] for surgical hip repair after a mechanical fall. However, his  hospital course has been complicated by intermittent agitation, delirium, paranoia,confusion mostly in the evenings and at night per treatment team. Patient is noted to be placed on Opiates and Benzodiazepine which could in some cases induces delirium but his treating physician states that patient has not been receiving Benzodiazepine in the last few days. Today, patient is alert, oriented, calm and cooperative. He denies psychosis and delusional thinking. He states that he did say that he felt like he is been prevented from going home by the staff yesterday out of the fact that he misses his family having been in the hospital for many days.  Past  Psychiatric History: Denies  Risk to Self: Is patient at risk for suicide?: patient denies Risk to Others:  None.  Denies HI. Prior Inpatient Therapy:  Denies Prior Outpatient Therapy:  Denies  Past Medical History:  Past Medical History:  Diagnosis Date  . Arthritis   . CAD (coronary artery disease)    2/19 PCI/DESx mRCA, and PDA x1. Normal EF.   Marland Kitchen Chronic pain   . Diabetes mellitus ([place])   . Hyperlipidemia   . Hypertension   . STEMI (ST elevation myocardial infarction) ([place])   . Tobacco use     Past Surgical History:  Procedure Laterality Date  . CORONARY STENT INTERVENTION N/A 11/13/2017   Procedure: CORONARY STENT INTERVENTION;  Surgeon: Martinique, Peter M, MD;  Location: [facility];  Service: Cardiovascular;  Laterality: N/A;  . CORONARY/GRAFT ACUTE MI REVASCULARIZATION N/A 11/13/2017   Procedure: Coronary/Graft Acute MI Revascularization;  Surgeon: Martinique, Peter M, MD;  Location: [facility];  Service: Cardiovascular;  Laterality: N/A;  . LEFT HEART CATH AND CORONARY ANGIOGRAPHY N/A 11/13/2017   Procedure: LEFT HEART CATH AND CORONARY ANGIOGRAPHY;  Surgeon: Martinique, Peter M, MD;  Location: [facility];  Service: Cardiovascular;  Laterality: N/A;  . TOTAL HIP ARTHROPLASTY Right 12/18/2017   Procedure: TOTAL HIP ARTHROPLASTY ANTERIOR APPROACH;  Surgeon: Rod Can, MD;  Location: [facility];  Service: Orthopedics;  Laterality: Right;   Family History:  Family History  Problem Relation Age of Onset  . Hypertension Father   . Heart disease Father    Family Psychiatric  History: Denies Social History:  Social History   Substance and Sexual Activity  Alcohol  Use No  . Frequency: Never     Social History   Substance and Sexual Activity  Drug Use No    Social History   Socioeconomic History  . Marital status: Married    Spouse name: Not on file  . Number of children: Not on file  . Years of education: Not on file  . Highest education level: Not on  file  Occupational History  . Not on file  Social Needs  . Financial resource strain: Not on file  . Food insecurity:    Worry: Not on file    Inability: Not on file  . Transportation needs:    Medical: Not on file    Non-medical: Not on file  Tobacco Use  . Smoking status: Former Smoker    Types: Cigarettes    Start date: 10/25/2017    Last attempt to quit: 11/15/2017    Years since quitting: 0.1  . Smokeless tobacco: Never Used  Substance and Sexual Activity  . Alcohol use: No    Frequency: Never  . Drug use: No  . Sexual activity: Not on file  Lifestyle  . Physical activity:    Days per week: Not on file    Minutes per session: Not on file  . Stress: Not on file  Relationships  . Social connections:    Talks on phone: Not on file    Gets together: Not on file    Attends religious service: Not on file    Active member of club or organization: Not on file    Attends meetings of clubs or organizations: Not on file    Relationship status: Not on file  Other Topics Concern  . Not on file  Social History Narrative  . Not on file   Additional Social History: Patient lives at home with his wife of 14 years.  He does not have kids.  He used to work in recovery parts.  He denies alcohol or illicit substance use.    Allergies:   Allergies  Allergen Reactions  . Fish Allergy Swelling    ALL fish  . Gabapentin Other (See Comments)    Unknown reaction  . Tamsulosin Other (See Comments)    Unknown reaction  . Betadine [Povidone Iodine] Swelling  . Iodine Swelling  . Shellfish Allergy Swelling    Facial swelling  . Sulfamethoxazole-Trimethoprim Swelling    Facial swelling  . Trimethoprim Swelling    Facial swelling  . Tramadol Other (See Comments)    Makes him crazy    Labs:  Results for orders placed or performed during the hospital encounter of 12/09/17 (from the past 48 hour(s))  Glucose, capillary     Status: Abnormal   Collection Time: 12/22/17  4:14 PM   Result Value Ref Range   Glucose-Capillary 180 (H) 65 - 99 mg/dL  Glucose, capillary     Status: Abnormal   Collection Time: 12/22/17  4:47 PM  Result Value Ref Range   Glucose-Capillary 176 (H) 65 - 99 mg/dL  Glucose, capillary     Status: Abnormal   Collection Time: 12/22/17  9:35 PM  Result Value Ref Range   Glucose-Capillary 164 (H) 65 - 99 mg/dL  Basic metabolic panel     Status: Abnormal   Collection Time: 12/23/17  3:09 AM  Result Value Ref Range   Sodium 139 135 - 145 mmol/L   Potassium 3.8 3.5 - 5.1 mmol/L   Chloride 107 101 - 111 mmol/L   CO2 23  22 - 32 mmol/L   Glucose, Bld 191 (H) 65 - 99 mg/dL   BUN 23 (H) 6 - 20 mg/dL   Creatinine, Ser 1.17 0.61 - 1.24 mg/dL   Calcium 8.2 (L) 8.9 - 10.3 mg/dL   GFR calc non Af Amer >60 >60 mL/min   GFR calc Af Amer >60 >60 mL/min    Comment: (NOTE) The eGFR has been calculated using the CKD EPI equation. This calculation has not been validated in all clinical situations. eGFR's persistently <60 mL/min signify possible Chronic Kidney Disease.    Anion gap 9 5 - 15    Comment: Performed at [facility] [address], [place], [place] [address]  CBC     Status: Abnormal   Collection Time: 12/23/17  3:09 AM  Result Value Ref Range   WBC 14.3 (H) 4.0 - 10.5 K/uL   RBC 2.72 (L) 4.22 - 5.81 MIL/uL   Hemoglobin 8.0 (L) 13.0 - 17.0 g/dL   HCT 24.7 (L) 39.0 - 52.0 %   MCV 90.8 78.0 - 100.0 fL   MCH 29.4 26.0 - 34.0 pg   MCHC 32.4 30.0 - 36.0 g/dL   RDW 14.7 11.5 - 15.5 %   Platelets 402 (H) 150 - 400 K/uL    Comment: Performed at [facility], [place] [address], [place], [place] [address]  Glucose, capillary     Status: Abnormal   Collection Time: 12/23/17  7:36 AM  Result Value Ref Range   Glucose-Capillary 227 (H) 65 - 99 mg/dL  Glucose, capillary     Status: Abnormal   Collection Time: 12/23/17 11:24 AM  Result Value Ref Range   Glucose-Capillary 192 (H) 65 - 99 mg/dL  Glucose, capillary     Status: Abnormal    Collection Time: 12/23/17  4:36 PM  Result Value Ref Range   Glucose-Capillary 197 (H) 65 - 99 mg/dL  Glucose, capillary     Status: Abnormal   Collection Time: 12/23/17  9:08 PM  Result Value Ref Range   Glucose-Capillary 190 (H) 65 - 99 mg/dL  CBC     Status: Abnormal   Collection Time: 12/24/17  4:20 AM  Result Value Ref Range   WBC 13.0 (H) 4.0 - 10.5 K/uL   RBC 2.70 (L) 4.22 - 5.81 MIL/uL   Hemoglobin 7.7 (L) 13.0 - 17.0 g/dL   HCT 24.2 (L) 39.0 - 52.0 %   MCV 89.6 78.0 - 100.0 fL   MCH 28.5 26.0 - 34.0 pg   MCHC 31.8 30.0 - 36.0 g/dL   RDW 14.9 11.5 - 15.5 %   Platelets 368 150 - 400 K/uL    Comment: Performed at [facility], [place]. [address], [place], [place] [address]  Glucose, capillary     Status: Abnormal   Collection Time: 12/24/17  7:37 AM  Result Value Ref Range   Glucose-Capillary 148 (H) 65 - 99 mg/dL  Glucose, capillary     Status: Abnormal   Collection Time: 12/24/17 11:53 AM  Result Value Ref Range   Glucose-Capillary 196 (H) 65 - 99 mg/dL    Current Facility-Administered Medications  Medication Dose Route Frequency Provider Last Rate Last Dose  . acetaminophen (TYLENOL) tablet 650 mg  650 mg Oral Q6H PRN Rod Can, MD   650 mg at 12/19/17 0401  . apixaban (ELIQUIS) tablet 2.5 mg  2.5 mg Oral BID Burnell Blanks, MD   2.5 mg at 12/24/17 0946  . atorvastatin (LIPITOR)  tablet 80 mg  80 mg Oral q1800 Rod Can, MD   80 mg at 12/23/17 1812  . clopidogrel (PLAVIX) tablet 75 mg  75 mg Oral Q breakfast Sherren Mocha, MD   75 mg at 12/24/17 0946  . diazepam (VALIUM) tablet 10 mg  10 mg Oral QHS PRN Hongalgi, Anand D, MD      . docusate sodium (COLACE) capsule 100 mg  100 mg Oral BID Rod Can, MD   100 mg at 12/24/17 0946  . feeding supplement (ENSURE ENLIVE) (ENSURE ENLIVE) liquid 237 mL  237 mL Oral TID BM Starla Link, Kshitiz, MD   237 mL at 12/23/17 0836  . Glycerin (Adult) 2.1 g suppository 1 suppository  1 suppository Rectal Daily PRN  Rod Can, MD   1 suppository at 12/17/17 1132  . haloperidol lactate (HALDOL) injection 2 mg  2 mg Intravenous Q12H PRN Modena Jansky, MD   2 mg at 12/23/17 0516  . HYDROcodone-acetaminophen (NORCO/VICODIN) 5-325 MG per tablet 1 tablet  1 tablet Oral Q6H PRN Hongalgi, Anand D, MD      . insulin aspart (novoLOG) injection 0-9 Units  0-9 Units Subcutaneous TID WC Rod Can, MD   1 Units at 12/24/17 0940  . lisinopril (PRINIVIL,ZESTRIL) tablet 5 mg  5 mg Oral Daily Rod Can, MD   5 mg at 12/24/17 0946  . metoprolol tartrate (LOPRESSOR) tablet 50 mg  50 mg Oral TID Kathyrn Drown D, NP   50 mg at 12/24/17 0947  . naloxone [facility]) injection 0.4 mg  0.4 mg Intravenous PRN Rod Can, MD      . ondansetron [facility]) tablet 4 mg  4 mg Oral Q6H PRN Swinteck, Aaron Edelman, MD       Or  . ondansetron (ZOFRAN) injection 4 mg  4 mg Intravenous Q6H PRN Swinteck, Aaron Edelman, MD      . polyethylene glycol (MIRALAX / GLYCOLAX) packet 17 g  17 g Oral Daily Rod Can, MD   17 g at 12/24/17 0947  . senna (SENOKOT) tablet 8.6 mg  1 tablet Oral QHS Rod Can, MD   8.6 mg at 12/23/17 2221  . vitamin B-12 (CYANOCOBALAMIN) tablet 1,000 mcg  1,000 mcg Oral Daily Modena Jansky, MD   1,000 mcg at 12/24/17 5956    Musculoskeletal: Strength & Muscle Tone: decreased due to physical deconditioning. Gait & Station: UTA due to patient lying in bed. Patient leans: N/A  Psychiatric Specialty Exam: Physical Exam  Nursing note and vitals reviewed. Constitutional: He appears well-developed and well-nourished.  HENT:  Head: Normocephalic and atraumatic.  Neck: Normal range of motion.  Respiratory: Effort normal.  Musculoskeletal: Normal range of motion.  Neurological: He is alert.  Oriented to person only.  Skin: No rash noted.  Psychiatric: He has a normal mood and affect. His speech is normal and behavior is normal. Judgment and thought content normal. Cognition and memory are normal.     Review of Systems  Cardiovascular: Negative for chest pain.  Gastrointestinal: Negative for abdominal pain, constipation, diarrhea and vomiting.  Psychiatric/Behavioral: Negative for hallucinations, substance abuse and suicidal ideas. The patient is not nervous/anxious and does not have insomnia.   All other systems reviewed and are negative.   Blood pressure 117/66, pulse 81, temperature 98.3 F (36.8 C), temperature source Oral, resp. rate 14, height 6' (1.829 m), weight 84.2 kg (185 lb 10 oz), SpO2 98 %.Body mass index is 25.18 kg/m.  General Appearance: Fairly Groomed, elderly, Caucasian male who appears younger than  his stated age and is lying in bed in a hospital gown with unshaved face. NAD.   Eye Contact:  Fair  Speech:  Clear and Coherent and Normal Rate  Volume:  Normal  Mood:  Euthymic  Affect:  Constricted  Thought Process:  Linear and Descriptions of Associations: Intact  Orientation:  Other:  Only to person and place  Thought Content:  Logical  Suicidal Thoughts:  No  Homicidal Thoughts:  No  Memory:  Immediate;   Fair Recent;   Fair Remote;   Fair  Judgement:  Other:  marginal  Insight:  Shallow  Psychomotor Activity:  Decreased  Concentration:  Concentration: Fair and Attention Span: Fair  Recall:  Poor  Fund of Knowledge:  Fair  Language:  Good  Akathisia:  No  Handed:  Right  AIMS (if indicated):   N/A  Assets:  Housing Intimacy Social Support  ADL's:  Impaired   Cognition: marginal  Sleep:   Okay   Assessment:  Patient is  72 y.o. Man with no prior history of mental illness but was admitted with right hip fracture. Hospital course has been complicated by intermittent agitation, paranoia and delirium following following surgical hip repair. Today, he is alert, oriented, calm and cooperative. Clearly, I am not able to see any need for routine psychiatric medication management. The cause of Delirium needs to be determined so as not to mask it by  antipsychotic.  Treatment Plan Summary: -Medical work up to rule Delirium -May need Neurology consult. -Consider  medication review/adjustment(Patient on Opioid and Benzodiazepine) -Recommend continuing Haldol 2 mg q 12 hours PRN for agitation secondary to delirium.  -EKG reviewed and QTc 447 on 3/23. Continue to monitor for QTc prolongation if frequently requires QTc prolonging agents.   -Psychiatry will sign off on patient at this time. -Re-consult psych as needed   Disposition: No evidence of imminent risk to self or others at present.     Corena Pilgrim, MD 12/24/2017 1:37 PM
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
## 2017-12-24 NOTE — Progress Notes (Signed)
Bladder scan 450. I & O cath done with 350 cc urine output.

## 2017-12-25 ENCOUNTER — Encounter (HOSPITAL_COMMUNITY): Payer: Self-pay | Admitting: Orthopedic Surgery

## 2017-12-25 DIAGNOSIS — S72001S Fracture of unspecified part of neck of right femur, sequela: Secondary | ICD-10-CM

## 2017-12-25 LAB — CBC
HCT: 25.5 % — ABNORMAL LOW (ref 39.0–52.0)
Hemoglobin: 8.1 g/dL — ABNORMAL LOW (ref 13.0–17.0)
MCH: 28.4 pg (ref 26.0–34.0)
MCHC: 31.8 g/dL (ref 30.0–36.0)
MCV: 89.5 fL (ref 78.0–100.0)
PLATELETS: 409 10*3/uL — AB (ref 150–400)
RBC: 2.85 MIL/uL — ABNORMAL LOW (ref 4.22–5.81)
RDW: 14.8 % (ref 11.5–15.5)
WBC: 13.2 10*3/uL — AB (ref 4.0–10.5)

## 2017-12-25 LAB — CREATININE, SERUM
CREATININE: 1.05 mg/dL (ref 0.61–1.24)
GFR calc Af Amer: >60 mL/min (ref >60)

## 2017-12-25 LAB — GLUCOSE, CAPILLARY
Glucose-Capillary: 134 mg/dL — ABNORMAL HIGH (ref 65–99)
Glucose-Capillary: 152 mg/dL — ABNORMAL HIGH (ref 65–99)

## 2017-12-25 MED ORDER — ACETAMINOPHEN 325 MG PO TABS: 650.0000 mg | ORAL_TABLET | Freq: Four times a day (QID) | ORAL | Status: DC | PRN

## 2017-12-25 MED ORDER — TAMSULOSIN HCL 0.4 MG PO CAPS
0.4000 mg | ORAL_CAPSULE | Freq: Every day | ORAL | Status: DC
  Administered 2017-12-25: 0.4 mg via ORAL
  Filled 2017-12-25: qty 1

## 2017-12-25 MED ORDER — APIXABAN 5 MG PO TABS: 5.0000 mg | ORAL_TABLET | Freq: Two times a day (BID) | ORAL | Status: DC

## 2017-12-25 MED ORDER — SENNA 8.6 MG PO TABS: 1.0000 | ORAL_TABLET | Freq: Every day | ORAL | Status: DC

## 2017-12-25 MED ORDER — INSULIN ASPART 100 UNIT/ML ~~LOC~~ SOLN: 0.0000 [IU] | Freq: Three times a day (TID) | SUBCUTANEOUS | Status: DC

## 2017-12-25 MED ORDER — DOCUSATE SODIUM 100 MG PO CAPS: 100.0000 mg | ORAL_CAPSULE | Freq: Two times a day (BID) | ORAL | Status: DC

## 2017-12-25 MED ORDER — POLYETHYLENE GLYCOL 3350 17 G PO PACK: 17.0000 g | PACK | Freq: Every day | ORAL | Status: DC

## 2017-12-25 MED ORDER — HYDROCODONE-ACETAMINOPHEN 5-325 MG PO TABS: 1.0000 | ORAL_TABLET | Freq: Four times a day (QID) | ORAL | 0 refills | Status: DC | PRN

## 2017-12-25 MED ORDER — CLOPIDOGREL BISULFATE 75 MG PO TABS: 75.0000 mg | ORAL_TABLET | Freq: Every day | ORAL | Status: DC

## 2017-12-25 MED ORDER — ENSURE ENLIVE PO LIQD: 237.0000 mL | Freq: Three times a day (TID) | ORAL | Status: DC

## 2017-12-25 MED ORDER — TAMSULOSIN HCL 0.4 MG PO CAPS: 0.4000 mg | ORAL_CAPSULE | Freq: Every day | ORAL | Status: DC

## 2017-12-25 MED ORDER — CYANOCOBALAMIN 1000 MCG PO TABS: 1000.0000 ug | ORAL_TABLET | Freq: Every day | ORAL | Status: DC

## 2017-12-25 MED ORDER — METOPROLOL TARTRATE 25 MG PO TABS: 50.0000 mg | ORAL_TABLET | Freq: Three times a day (TID) | ORAL | Status: DC

## 2017-12-25 MED ORDER — GLYCERIN (LAXATIVE) 2.1 G RE SUPP: 1.0000 | Freq: Every day | RECTAL | Status: DC | PRN

## 2017-12-25 NOTE — Discharge Summary (Signed)
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 12/13/2017 1455   PHURINE 6.0 12/13/2017 1455   GLUCOSEU 50 (A) 12/13/2017 1455   HGBUR LARGE (A) 12/13/2017 1455   BILIRUBINUR NEGATIVE 12/13/2017 1455   KETONESUR 20 (A) 12/13/2017 1455   PROTEINUR 100 (A) 12/13/2017 1455   NITRITE NEGATIVE 12/13/2017 1455   LEUKOCYTESUR NEGATIVE 12/13/2017 1455  Discussed in detail with patient's spouse at bedside.  Updated care and answered questions.   Time coordinating discharge: Over 30 minutes  SIGNED:  Vernell Leep, MD, FACP, [facility]. [facility] Pager [phone]  If 7PM-7AM, please contact night-coverage [email or web address] Password [email or web address] 12/25/2017, 1:14 PM

## 2017-12-25 NOTE — Progress Notes (Addendum)
Patient will discharge to [facility] in [place], [place]. Anticipated discharge date: 12/25/17 Family notified: Alie Hardgrove, spouse Transportation by: Corey Harold. Patient's wife has paid out of pocket cost for ambulance transport, as the facility is out of [facility]'s 50 mile range.  Nurse to call report to [phone] - ask for unit 2. Patient will go to room 226A at the facility.    CSW signing off.  Estanislado Emms, [place]  Clinical Social Worker

## 2017-12-25 NOTE — Progress Notes (Signed)
Bladder scan 671 cc. I & O cath 450 cc urine output.

## 2017-12-25 NOTE — Care Management Important Message (Signed)
Important Message  Patient Details  Name: Dennis [name] MRN: [id number] Date of Birth: [birth year]   Medicare Important Message Given:  Yes    Kishawn Pickar P Deya Bigos 12/25/2017, 2:55 PM

## 2017-12-25 NOTE — Progress Notes (Addendum)
The patient has been seen in conjunction with Vin Bhagat, PAC. All aspects of care have been considered and discussed. The patient has been personally interviewed, examined, and all clinical data has been reviewed.   Continue current therapy including low dose Eliquis for now. Continue Plavix.  Follow hemoglobin.  Progress Note  Patient Name: Dennis [name] Date of Encounter: 12/25/2017  Primary Cardiologist: Carlyle Dolly, MD   Subjective   Sleepy but answers questions appropriately. No chest pain or shortness of breath. No family at bedside.   Inpatient Medications    Scheduled Meds: . apixaban  2.5 mg Oral BID  . atorvastatin  80 mg Oral q1800  . clopidogrel  75 mg Oral Q breakfast  . docusate sodium  100 mg Oral BID  . feeding supplement (ENSURE ENLIVE)  237 mL Oral TID BM  . insulin aspart  0-9 Units Subcutaneous TID WC  . lisinopril  5 mg Oral Daily  . metoprolol tartrate  50 mg Oral TID  . polyethylene glycol  17 g Oral Daily  . senna  1 tablet Oral QHS  . vitamin B-12  1,000 mcg Oral Daily   Continuous Infusions:  PRN Meds: acetaminophen, Glycerin (Adult), haloperidol lactate, HYDROcodone-acetaminophen, naLOXone (NARCAN)  injection, ondansetron **OR** ondansetron (ZOFRAN) IV   Vital Signs    Vitals:   12/24/17 1542 12/24/17 1610 12/24/17 1936 12/25/17 0348  BP: 122/62 127/74 127/63 (!) 122/56  Pulse: 68 71 73 70  Resp: 17  18 14   Temp: 98.3 F (36.8 C)  98.3 F (36.8 C) 98.1 F (36.7 C)  TempSrc: Oral  Oral Oral  SpO2: 98%  98% 96%  Weight:    183 lb 10.3 oz (83.3 kg)  Height:        Intake/Output Summary (Last 24 hours) at 12/25/2017 0743 Last data filed at 12/25/2017 0115 Gross per 24 hour  Intake 542 ml  Output 1040 ml  Net -498 ml   Filed Weights   12/23/17 0528 12/24/17 0410 12/25/17 0348  Weight: 190 lb 4.1 oz (86.3 kg) 185 lb 10 oz (84.2 kg) 183 lb 10.3 oz (83.3 kg)    Telemetry    SR at rate of 60s with PVCs- Personally  Reviewed  ECG    N/A  Physical Exam   GEN: No acute distress.   Neck: No JVD Cardiac: RRR, no murmurs, rubs, or gallops.  Respiratory: Clear to auscultation bilaterally. GI: Soft, nontender, non-distended  MS: No edema; No deformity. Neuro:  A & O with sleepiness but answers questions appropriatly Psych: Normal affect   Labs    Chemistry Recent Labs  Lab 12/20/17 0347 12/22/17 0433 12/23/17 0309 12/25/17 0317  NA 138 138 139  --   K 3.9 3.5 3.8  --   CL 104 106 107  --   CO2 21* 23 23  --   GLUCOSE 164* 174* 191*  --   BUN 27* 25* 23*  --   CREATININE 1.21 1.09 1.17 1.05  CALCIUM 8.1* 8.1* 8.2*  --   GFRNONAA 59* >60 >60 >60  GFRAA >60 >60 >60 >60  ANIONGAP 13 9 9   --      Hematology Recent Labs  Lab 12/23/17 0309 12/24/17 0420 12/25/17 0317  WBC 14.3* 13.0* 13.2*  RBC 2.72* 2.70* 2.85*  HGB 8.0* 7.7* 8.1*  HCT 24.7* 24.2* 25.5*  MCV 90.8 89.6 89.5  MCH 29.4 28.5 28.4  MCHC 32.4 31.8 31.8  RDW 14.7 14.9 14.8  PLT 402* 368  409*     Radiology    No results found.  Cardiac Studies   Cardiac cath 11/13/2017 Conclusion     Mid RCA lesion is 100% stenosed.  Dist RCA lesion is 40% stenosed.  Prox RCA lesion is 50% stenosed.  Ost RPDA to RPDA lesion is 99% stenosed.  Prox LAD to Mid LAD lesion is 60% stenosed.  Ost 1st Diag lesion is 90% stenosed.  Ost 2nd Diag to 2nd Diag lesion is 85% stenosed.  Ost 2nd Mrg to 2nd Mrg lesion is 40% stenosed.  Ost Cx to Prox Cx lesion is 40% stenosed.  Ost 1st Mrg to 1st Mrg lesion is 70% stenosed.  The left ventricular systolic function is normal.  LV end diastolic pressure is mildly elevated.  The left ventricular ejection fraction is 55-65% by visual estimate.  Post intervention, there is a 0% residual stenosis.  A drug-eluting stent was successfully placed using a STENT SYNERGY DES 2.25X28.  Post intervention, there is a 0% residual stenosis.  A drug-eluting stent was successfully  placed using a STENT SYNERGY DES 3X20.  1. 3 vessel obstructive CAD. - diffuse 60% proximal to mid LAD with severe disease in small diagonal branches. - 70% OM1. Stent in OM 2 is diffusely diseased but patent - 100% mid RCA and 99% PDA 2. Good LV function 3. Mildly elevated LVEDP 4. Successful stenting of the PDA and mid RCA with DES x 2. Diffusely diseased and heavily calcified RCA.  Plan: DAPT indefinitely. Aggressive risk factor modification and smoking cessation.     2D echocardiogram 11/14/2017 Study Conclusions  - Left ventricle: The cavity size was normal. Systolic function was normal. The estimated ejection fraction was in the range of 55% to 60%. Possible mild hypokinesis of the basal-midinferior myocardium. - Left atrium: The atrium was mildly dilated     Patient Profile     y.o.malewith hxof CAD with recent PCI,DM, hypertension, HLD, tobacco abusewho presented with right hip fracture and seen for pre-op clearance.  Post-op course has been complicated with acute confusion, delirium, anemia and afib.   Assessment & Plan    1. PAF: has post op afib following surgery.  - Maintaining sinus rhythm. Currently on Eliquis 2.5mg . Plan to change to 5 mg BID today as Hgb is stable. Will review with Dr. Tamala Julian.   2. Acute delirium -  Per primary team. Seems improving.   3. Anemia - Hgb stable today at 8.1.   4. CAD s/p recent stenting - Continue Plavix and statin. Aspirin discontinued on 12/22/17 due to need of anticoagulation. His PCI was done on 11/13/17.  For questions or updates, please contact [place] Please consult [email or web address] for contact info under Cardiology/STEMI.      Jarrett Soho, PA  12/25/2017, 7:43 AM

## 2017-12-25 NOTE — NC FL2 (Signed)
[place] LEVEL OF CARE SCREENING TOOL     IDENTIFICATION  Patient Name: Dennis [name] Birthdate: 07/09/1946 Sex: male Admission Date (Current Location): 12/09/2017  [facility] and [place] Number:  Herbalist and Address:  [facility], [place] [address], [place], [place] [address]      Provider Number: [id number]  Attending Physician Name and Address:  Modena Jansky, MD  Relative Name and Phone Number:  Gamal Todisco, spouse, [phone]    Current Level of Care: Hospital Recommended Level of Care: [place] Prior Approval Number:    Date Approved/Denied:   PASRR Number:    Discharge Plan: Home    Current Diagnoses: Patient Active Problem List   Diagnosis Date Noted  . Atrial fibrillation with RVR ([place])   . New onset a-fib ([place]) 12/15/2017  . Delirium   . Closed right hip fracture ([place]) 12/09/2017  . CAD in native artery 12/04/2017  . Hypertension 12/04/2017  . Hyperlipidemia 11/15/2017  . STEMI involving oth coronary artery of inferior wall ([place]) 11/13/2017  . Tobacco abuse 11/13/2017  . STEMI involving right coronary artery ([place]) 11/13/2017  . STEMI (ST elevation myocardial infarction) ([place]) 11/13/2017  . Diabetes mellitus due to underlying condition, uncontrolled ([place]) 04/07/2009    Orientation RESPIRATION BLADDER Height & Weight     Self, Time, Place  Normal Continent, Indwelling catheter Weight: 183 lb 10.3 oz (83.3 kg) Height:  6' (182.9 cm)  BEHAVIORAL SYMPTOMS/MOOD NEUROLOGICAL BOWEL NUTRITION STATUS      Continent Diet(please see DC summary)  AMBULATORY STATUS COMMUNICATION OF NEEDS Skin   Limited Assist(min to mod assist) Verbally Surgical wounds(closed incision R hip, wet to dry dressing)                       Personal Care Assistance Level of Assistance  Bathing, Feeding, Dressing Bathing Assistance: Limited assistance(min to mod assist) Feeding assistance:  Independent Dressing Assistance: Limited assistance(min to mod assist)     Functional Limitations Info  Sight, Hearing, Speech Sight Info: Impaired(blind L eye) Hearing Info: Adequate Speech Info: Adequate    SPECIAL CARE FACTORS FREQUENCY  PT (By licensed PT), OT (By licensed OT)     PT Frequency: 5x/week OT Frequency: 5x/week            Contractures Contractures Info: Not present    Additional Factors Info  Code Status, Allergies, Insulin Sliding Scale Code Status Info: Full Allergies Info: Fish Allergy, Gabapentin, Tamsulosin, Betadine (Povidone Iodine), Iodine, Shellfish Allergy, Sulfamethoxazole-trimethoprim, Trimethoprim, Tramadol   Insulin Sliding Scale Info: insulin novolog 3x/day with meals       Current Medications (12/25/2017):  This is the current hospital active medication list Current Facility-Administered Medications  Medication Dose Route Frequency Provider Last Rate Last Dose  . acetaminophen (TYLENOL) tablet 650 mg  650 mg Oral Q6H PRN Rod Can, MD   650 mg at 12/19/17 0401  . apixaban (ELIQUIS) tablet 2.5 mg  2.5 mg Oral BID Burnell Blanks, MD   2.5 mg at 12/25/17 0908  . atorvastatin (LIPITOR) tablet 80 mg  80 mg Oral q1800 Rod Can, MD   80 mg at 12/24/17 1748  . clopidogrel (PLAVIX) tablet 75 mg  75 mg Oral Q breakfast Sherren Mocha, MD   75 mg at 12/25/17 0906  . docusate sodium (COLACE) capsule 100 mg  100 mg Oral BID Rod Can, MD   100 mg at 12/25/17 0907  . feeding supplement (  ENSURE ENLIVE) (ENSURE ENLIVE) liquid 237 mL  237 mL Oral TID BM Aline August, MD   237 mL at 12/24/17 2224  . Glycerin (Adult) 2.1 g suppository 1 suppository  1 suppository Rectal Daily PRN Rod Can, MD   1 suppository at 12/17/17 1132  . haloperidol lactate (HALDOL) injection 2 mg  2 mg Intravenous Q12H PRN Modena Jansky, MD   2 mg at 12/24/17 2226  . HYDROcodone-acetaminophen (NORCO/VICODIN) 5-325 MG per tablet 1 tablet  1 tablet  Oral Q6H PRN Modena Jansky, MD   1 tablet at 12/25/17 0536  . insulin aspart (novoLOG) injection 0-9 Units  0-9 Units Subcutaneous TID WC Rod Can, MD   1 Units at 12/25/17 0908  . lisinopril (PRINIVIL,ZESTRIL) tablet 5 mg  5 mg Oral Daily Rod Can, MD   5 mg at 12/25/17 0908  . metoprolol tartrate (LOPRESSOR) tablet 50 mg  50 mg Oral TID Kathyrn Drown D, NP   50 mg at 12/25/17 0908  . naloxone [facility]) injection 0.4 mg  0.4 mg Intravenous PRN Rod Can, MD      . ondansetron [facility]) tablet 4 mg  4 mg Oral Q6H PRN Swinteck, Aaron Edelman, MD       Or  . ondansetron (ZOFRAN) injection 4 mg  4 mg Intravenous Q6H PRN Swinteck, Aaron Edelman, MD      . polyethylene glycol (MIRALAX / GLYCOLAX) packet 17 g  17 g Oral Daily Rod Can, MD   17 g at 12/25/17 0908  . senna (SENOKOT) tablet 8.6 mg  1 tablet Oral QHS Rod Can, MD   8.6 mg at 12/24/17 2223  . tamsulosin (FLOMAX) capsule 0.4 mg  0.4 mg Oral QPC breakfast Hongalgi, Anand D, MD      . vitamin B-12 (CYANOCOBALAMIN) tablet 1,000 mcg  1,000 mcg Oral Daily Modena Jansky, MD   1,000 mcg at 12/25/17 0907     Discharge Medications: Please see discharge summary for a list of discharge medications.  Relevant Imaging Results:  Relevant Lab Results:   Additional Information SSN: [id number]  Estanislado Emms, LCSW

## 2017-12-25 NOTE — Clinical Social Work Placement (Signed)
   CLINICAL SOCIAL WORK PLACEMENT  NOTE  Date:  12/25/2017  Patient Details  Name: Dennis [name] MRN: [id number] Date of Birth: 02/22/1946  Clinical Social Work is seeking post-discharge placement for this patient at the [place] level of care (*CSW will initial, date and re-position this form in  chart as items are completed):  Yes   Patient/family provided with [place] Work Department's list of facilities offering this level of care within the geographic area requested by the patient (or if unable, by the patient's family).  Yes   Patient/family informed of their freedom to choose among providers that offer the needed level of care, that participate in Medicare, Medicaid or managed care program needed by the patient, have an available bed and are willing to accept the patient.  Yes   Patient/family informed of [facility]'s ownership interest in [facility] and [facility], as well as of the fact that they are under no obligation to receive care at these facilities.  PASRR submitted to EDS on       PASRR number received on       Existing PASRR number confirmed on       FL2 transmitted to all facilities in geographic area requested by pt/family on 12/19/17     FL2 transmitted to all facilities within larger geographic area on       Patient informed that his/her managed care company has contracts with or will negotiate with certain facilities, including the following:  [facility] and [facility]     Yes   Patient/family informed of bed offers received.  Patient chooses bed at [facility] and [facility]     Physician recommends and patient chooses bed at      Patient to be transferred to [facility] on 12/25/17.  Patient to be transferred to facility by [facility]     Patient family notified on 12/25/17 of transfer.  Name of family member notified:  Jayshawn Colston, spouse     PHYSICIAN Please  prepare priority discharge summary, including medications, Please prepare prescriptions, Please sign FL2     Additional Comment:    _______________________________________________ Estanislado Emms, LCSW 12/25/2017, 10:55 AM

## 2017-12-26 LAB — VITAMIN B1: Vitamin B1 (Thiamine): 129.8 nmol/L (ref 66.5–200.0)

## 2017-12-29 ENCOUNTER — Telehealth: Payer: Self-pay | Admitting: Cardiology

## 2017-12-29 MED ORDER — APIXABAN 5 MG PO TABS: 5.0000 mg | ORAL_TABLET | Freq: Two times a day (BID) | ORAL | 3 refills | Status: DC

## 2017-12-29 MED ORDER — CLOPIDOGREL BISULFATE 75 MG PO TABS: 75.0000 mg | ORAL_TABLET | Freq: Every day | ORAL | 3 refills | Status: DC

## 2017-12-29 NOTE — Telephone Encounter (Signed)
Returned pt call. Pt's wife stated that she needed his Plavix & Eliquis called in to [facility] [address]. Pt signed himself out of [facility] yesterday and has not had any of his medication. Per his discharge summary on 4/8 he should be on Eliquis 5 mg BD, and Plavix 75 mg daily.

## 2017-12-29 NOTE — Telephone Encounter (Signed)
Needs to discuss medication  Thinks there were changes while in the hospital and she is not clear about which.   Also stated that he is out of medication but unsure which to get filled

## 2018-01-15 ENCOUNTER — Encounter: Payer: Self-pay | Admitting: Cardiology

## 2018-01-15 ENCOUNTER — Ambulatory Visit: Payer: Medicare HMO | Admitting: Cardiology

## 2018-01-15 VITALS — BP 130/69 | HR 75 | Ht 72.0 in | Wt 189.8 lb

## 2018-01-15 DIAGNOSIS — R0989 Other specified symptoms and signs involving the circulatory and respiratory systems: Secondary | ICD-10-CM

## 2018-01-15 DIAGNOSIS — I2510 Atherosclerotic heart disease of native coronary artery without angina pectoris: Secondary | ICD-10-CM

## 2018-01-15 DIAGNOSIS — I5032 Chronic diastolic (congestive) heart failure: Secondary | ICD-10-CM

## 2018-01-15 DIAGNOSIS — I4891 Unspecified atrial fibrillation: Secondary | ICD-10-CM | POA: Diagnosis not present

## 2018-01-15 NOTE — Patient Instructions (Signed)
Medication Instructions:  Your physician recommends that you continue on your current medications as directed. Please refer to the Current Medication list given to you today.  Labwork: CBC In 2 weeks Orders given today  Testing/Procedures: Your physician has recommended that you wear an event monitor FOR 30 DAYS. Event monitors are medical devices that record the heart's electrical activity. Doctors most often [place] these monitors to diagnose arrhythmias. Arrhythmias are problems with the speed or rhythm of the heartbeat. The monitor is a small, portable device. You can wear one while you do your normal daily activities. This is usually used to diagnose what is causing palpitations/syncope (passing out).  Your physician has requested that you have a carotid duplex. This test is an ultrasound of the carotid arteries in your neck. It looks at blood flow through these arteries that supply the brain with blood. Allow one hour for this exam. There are no restrictions or special instructions.  Follow-Up: Your physician recommends that you schedule a follow-up appointment in: 2 MONTHS WITH DR. BRANCH   Any Other Special Instructions Will Be Listed Below (If Applicable).  If you need a refill on your cardiac medications before your next appointment, please call your pharmacy.

## 2018-01-15 NOTE — Progress Notes (Signed)
Clinical Summary Dennis [name] is a 72 y.o.male seen for follow up of the following meidcal problems.   1. CAD - multiple stents in the past, last stent 10/2017 to RCA and PDA.  - 10/2017 echo LVEF 55-60% - had been on triple therapy, found to be anemic during 12/2017 admission and ASA was stopped (has had some issues with hematuria around that time as well), on plavix and eliquis.   - medical therapy limited by soft bp's  No chest pain, no SOB or DOE - compliant with meds  2. PAF - notes indicate new onset afib postop hip surgery 11/2017 - no bleeding on eliquis - taking lopressor 12.5mg  bid  3. Chronic diastolic HF - no recent edema. No SOB/DOE  4. Chronic anemia.  Past Medical History:  Diagnosis Date  . Arthritis   . CAD (coronary artery disease)    2/19 PCI/DESx mRCA, and PDA x1. Normal EF.   Marland Kitchen Chronic pain   . Diabetes mellitus ([place])   . Hyperlipidemia   . Hypertension   . STEMI (ST elevation myocardial infarction) ([place])   . Tobacco use      Allergies  Allergen Reactions  . Fish Allergy Swelling    ALL fish  . Gabapentin Other (See Comments)    Unknown reaction  . Tamsulosin Other (See Comments)    Unknown reaction  . Betadine [Povidone Iodine] Swelling  . Iodine Swelling  . Shellfish Allergy Swelling    Facial swelling  . Sulfamethoxazole-Trimethoprim Swelling    Facial swelling  . Trimethoprim Swelling    Facial swelling  . Tramadol Other (See Comments)    Makes him crazy     Current Outpatient Medications  Medication Sig Dispense Refill  . acetaminophen (TYLENOL) 325 MG tablet Take 2 tablets (650 mg total) by mouth every 6 (six) hours as needed for mild pain, fever or headache.    Marland Kitchen apixaban (ELIQUIS) 5 MG TABS tablet Take 1 tablet (5 mg total) by mouth 2 (two) times daily. 60 tablet 3  . atorvastatin (LIPITOR) 80 MG tablet Take 1 tablet (80 mg total) by mouth daily at 6 PM. 90 tablet 1  . clopidogrel (PLAVIX) 75 MG tablet Take 1 tablet (75 mg  total) by mouth daily with breakfast. 90 tablet 3  . docusate sodium (COLACE) 100 MG capsule Take 1 capsule (100 mg total) by mouth 2 (two) times daily.    . feeding supplement, ENSURE ENLIVE, (ENSURE ENLIVE) LIQD Take 237 mLs by mouth 3 (three) times daily between meals.    . Glycerin, Adult, 2.1 g SUPP Place 1 suppository rectally daily as needed for severe constipation.    Marland Kitchen HYDROcodone-acetaminophen (NORCO/VICODIN) 5-325 MG tablet Take 1 tablet by mouth every 6 (six) hours as needed for moderate pain or severe pain. 15 tablet 0  . insulin aspart (NOVOLOG) 100 UNIT/ML injection Inject 0-9 Units into the skin 3 (three) times daily with meals. CBG < 70: implement hypoglycemia protocol CBG 70 - 120: 0 units CBG 121 - 150: 1 unit CBG 151 - 200: 2 units CBG 201 - 250: 3 units CBG 251 - 300: 5 units CBG 301 - 350: 7 units CBG 351 - 400: 9 units CBG > 400: call MD and obtain STAT lab verification    . lisinopril (PRINIVIL,ZESTRIL) 5 MG tablet Take 5 mg by mouth daily.    . metFORMIN (GLUCOPHAGE) 850 MG tablet Take 850 mg by mouth daily with breakfast.    . metoprolol tartrate (  LOPRESSOR) 25 MG tablet Take 2 tablets (50 mg total) by mouth 3 (three) times daily.    . nitroGLYCERIN (NITROSTAT) 0.4 MG SL tablet Place 1 tablet (0.4 mg total) under the tongue every 5 (five) minutes x 3 doses as needed for chest pain. 25 tablet 2  . polyethylene glycol (MIRALAX / GLYCOLAX) packet Take 17 g by mouth daily.    Marland Kitchen senna (SENOKOT) 8.6 MG TABS tablet Take 1 tablet (8.6 mg total) by mouth at bedtime.    . tamsulosin (FLOMAX) 0.4 MG CAPS capsule Take 1 capsule (0.4 mg total) by mouth daily after breakfast.    . vitamin B-12 1000 MCG tablet Take 1 tablet (1,000 mcg total) by mouth daily.     No current facility-administered medications for this visit.      Past Surgical History:  Procedure Laterality Date  . CORONARY STENT INTERVENTION N/A 11/13/2017   Procedure: CORONARY STENT INTERVENTION;  Surgeon:  Martinique, Peter M, MD;  Location: [place] [facility];  Service: Cardiovascular;  Laterality: N/A;  . CORONARY/GRAFT ACUTE MI REVASCULARIZATION N/A 11/13/2017   Procedure: Coronary/Graft Acute MI Revascularization;  Surgeon: Martinique, Peter M, MD;  Location: [place] [facility];  Service: Cardiovascular;  Laterality: N/A;  . LEFT HEART CATH AND CORONARY ANGIOGRAPHY N/A 11/13/2017   Procedure: LEFT HEART CATH AND CORONARY ANGIOGRAPHY;  Surgeon: Martinique, Peter M, MD;  Location: [place] [facility];  Service: Cardiovascular;  Laterality: N/A;  . TOTAL HIP ARTHROPLASTY Right 12/18/2017   Procedure: TOTAL HIP ARTHROPLASTY ANTERIOR APPROACH;  Surgeon: Rod Can, MD;  Location: [place];  Service: Orthopedics;  Laterality: Right;     Allergies  Allergen Reactions  . Fish Allergy Swelling    ALL fish  . Gabapentin Other (See Comments)    Unknown reaction  . Tamsulosin Other (See Comments)    Unknown reaction  . Betadine [Povidone Iodine] Swelling  . Iodine Swelling  . Shellfish Allergy Swelling    Facial swelling  . Sulfamethoxazole-Trimethoprim Swelling    Facial swelling  . Trimethoprim Swelling    Facial swelling  . Tramadol Other (See Comments)    Makes him crazy      Family History  Problem Relation Age of Onset  . Hypertension Father   . Heart disease Father      Social History Dennis [name] reports that he quit smoking about 2 months ago. His smoking use included cigarettes. He started smoking about 2 months ago. He has never used smokeless tobacco. Dennis [name] reports that he does not drink alcohol.   Review of Systems CONSTITUTIONAL: No weight loss, fever, chills, weakness or fatigue.  HEENT: Eyes: No visual loss, blurred vision, double vision or yellow sclerae.No hearing loss, sneezing, congestion, runny nose or sore throat.  SKIN: No rash or itching.  CARDIOVASCULAR: per hpi RESPIRATORY: No shortness of breath, cough or sputum.  GASTROINTESTINAL: No anorexia, nausea, vomiting or  diarrhea. No abdominal pain or blood.  GENITOURINARY: No burning on urination, no polyuria NEUROLOGICAL: No headache, dizziness, syncope, paralysis, ataxia, numbness or tingling in the extremities. No change in bowel or bladder control.  MUSCULOSKELETAL: No muscle, back pain, joint pain or stiffness.  LYMPHATICS: No enlarged nodes. No history of splenectomy.  PSYCHIATRIC: No history of depression or anxiety.  ENDOCRINOLOGIC: No reports of sweating, cold or heat intolerance. No polyuria or polydipsia.  Marland Kitchen   Physical Examination Vitals:   01/15/18 1447  BP: 130/69  Pulse: 75   Vitals:   01/15/18 1447  Weight: 189  lb 12.8 oz (86.1 kg)  Height: 6' (1.829 m)    Gen: resting comfortably, no acute distress HEENT: no scleral icterus, pupils equal round and reactive, no palptable cervical adenopathy,  CV: RRR, no m/r/,g n ojvd. Bilateral carotid bruits Resp: Clear to auscultation bilaterally GI: abdomen is soft, non-tender, non-distended, normal bowel sounds, no hepatosplenomegaly MSK: extremities are warm, no edema.  Skin: warm, no rash Neuro:  no focal deficits Psych: appropriate affect   Diagnostic Studies 10/2017 cath  Mid RCA lesion is 100% stenosed.  Dist RCA lesion is 40% stenosed.  Prox RCA lesion is 50% stenosed.  Ost RPDA to RPDA lesion is 99% stenosed.  Prox LAD to Mid LAD lesion is 60% stenosed.  Ost 1st Diag lesion is 90% stenosed.  Ost 2nd Diag to 2nd Diag lesion is 85% stenosed.  Ost 2nd Mrg to 2nd Mrg lesion is 40% stenosed.  Ost Cx to Prox Cx lesion is 40% stenosed.  Ost 1st Mrg to 1st Mrg lesion is 70% stenosed.  The left ventricular systolic function is normal.  LV end diastolic pressure is mildly elevated.  The left ventricular ejection fraction is 55-65% by visual estimate.  Post intervention, there is a 0% residual stenosis.  A drug-eluting stent was successfully placed using a STENT SYNERGY DES 2.25X28.  Post intervention, there is a 0%  residual stenosis.  A drug-eluting stent was successfully placed using a STENT SYNERGY DES 3X20.   1. 3 vessel obstructive CAD.    - diffuse 60% proximal to mid LAD with severe disease in small diagonal branches.    - 70% OM1. Stent in OM 2 is diffusely diseased but patent    - 100% mid RCA and 99% PDA 2. Good LV function 3. Mildly elevated LVEDP 4. Successful stenting of the PDA and mid RCA with DES x 2. Diffusely diseased and heavily calcified RCA.  Plan: DAPT indefinitely. Aggressive risk factor modification and smoking cessation.    10/2017 echo Study Conclusions  - Left ventricle: The cavity size was normal. Systolic function was   normal. The estimated ejection fraction was in the range of 55%   to 60%. Possible mild hypokinesis of the basal-midinferior   myocardium. - Left atrium: The atrium was mildly dilated.   Assessment and Plan  1. CAD - no recent symptoms, continue current meds. On plavix and eliquis due to recent stenting  2. Afib - new onset after hip surgery 11/2017 - unclear if isolated event after surgery or if true PAF. EKG today shows NSR - obtain 30 day event monitor. If no recurrence could consider coming off eliquis - repeat cbc with recent issues with anemia and on anticoag  3. Chronic diastolic HF - euvolemic, continue current therapy.  4. Carotid bruit - order carotid US  F/u 2 months  Arnoldo Lenis, M.D.

## 2018-01-20 ENCOUNTER — Encounter: Payer: Self-pay | Admitting: Cardiology

## 2018-01-23 ENCOUNTER — Encounter: Payer: Self-pay | Admitting: Cardiology

## 2018-01-23 ENCOUNTER — Ambulatory Visit: Payer: Medicare HMO | Admitting: Cardiology

## 2018-01-23 DIAGNOSIS — Z7901 Long term (current) use of anticoagulants: Secondary | ICD-10-CM | POA: Diagnosis not present

## 2018-01-23 DIAGNOSIS — M549 Dorsalgia, unspecified: Secondary | ICD-10-CM | POA: Diagnosis not present

## 2018-01-23 DIAGNOSIS — R002 Palpitations: Secondary | ICD-10-CM | POA: Diagnosis not present

## 2018-01-23 DIAGNOSIS — Z9861 Coronary angioplasty status: Secondary | ICD-10-CM | POA: Diagnosis not present

## 2018-01-23 DIAGNOSIS — I252 Old myocardial infarction: Secondary | ICD-10-CM | POA: Diagnosis not present

## 2018-01-23 DIAGNOSIS — Z794 Long term (current) use of insulin: Secondary | ICD-10-CM | POA: Diagnosis not present

## 2018-01-23 DIAGNOSIS — G8929 Other chronic pain: Secondary | ICD-10-CM | POA: Diagnosis not present

## 2018-01-23 DIAGNOSIS — E118 Type 2 diabetes mellitus with unspecified complications: Secondary | ICD-10-CM

## 2018-01-23 DIAGNOSIS — I2510 Atherosclerotic heart disease of native coronary artery without angina pectoris: Secondary | ICD-10-CM

## 2018-01-23 DIAGNOSIS — I480 Paroxysmal atrial fibrillation: Secondary | ICD-10-CM | POA: Diagnosis not present

## 2018-01-23 DIAGNOSIS — S72001S Fracture of unspecified part of neck of right femur, sequela: Secondary | ICD-10-CM | POA: Diagnosis not present

## 2018-01-23 DIAGNOSIS — IMO0001 Reserved for inherently not codable concepts without codable children: Secondary | ICD-10-CM

## 2018-01-23 MED ORDER — METOPROLOL TARTRATE 25 MG PO TABS: 25.0000 mg | ORAL_TABLET | Freq: Two times a day (BID) | ORAL | 3 refills | Status: DC

## 2018-01-23 NOTE — Assessment & Plan Note (Signed)
After a fall March 2019

## 2018-01-23 NOTE — Assessment & Plan Note (Signed)
Pt seen in ED at [facility] with "chest pain" but his history sounds more like palpitations (recurrent PAF) to me.

## 2018-01-23 NOTE — Progress Notes (Signed)
01/23/2018 Dennis [name]   14-Aug-1946  [id number]  Primary Physician Glenda Chroman, MD Primary Cardiologist: [name]  HPI:  72 y/o male with a history of CAD, s/p remote PCI in 2006 then lost to f/u. He presented to [facility] in Feb 2019 with a STEMI. He was transferred to [facility] and underwent PCI with DES to his RCA and PDA. He has a history of chronic back pain. PT was recommended but pt declined. He then fell at home in March 2019 and broke his hip. That hospital course was complicated by delirium, anemia, and PAF. He finished his 30 days of ASA and was discharged on Plavix and Eliquis. [name] saw him 01/15/18.  He was sent to the office today from the ED at [facility] where he presented with "chest pain". The patient describes his heart racing last night in bed. He took NTG for this. The whole episode lasted about 20 minutes. He really didn't have chest pain like he did pre PCI. His EKG at [facility] showed NSR.   Current Outpatient Medications  Medication Sig Dispense Refill  . acetaminophen (TYLENOL) 325 MG tablet Take 2 tablets (650 mg total) by mouth every 6 (six) hours as needed for mild pain, fever or headache.    Marland Kitchen apixaban (ELIQUIS) 5 MG TABS tablet Take 1 tablet (5 mg total) by mouth 2 (two) times daily. 60 tablet 3  . atorvastatin (LIPITOR) 80 MG tablet Take 1 tablet (80 mg total) by mouth daily at 6 PM. 90 tablet 1  . clopidogrel (PLAVIX) 75 MG tablet Take 1 tablet (75 mg total) by mouth daily with breakfast. 90 tablet 3  . HYDROcodone-acetaminophen (NORCO/VICODIN) 5-325 MG tablet Take 1 tablet by mouth every 6 (six) hours as needed for moderate pain or severe pain. 15 tablet 0  . insulin aspart (NOVOLOG) 100 UNIT/ML injection Inject 0-9 Units into the skin 3 (three) times daily with meals. CBG < 70: implement hypoglycemia protocol CBG 70 - 120: 0 units CBG 121 - 150: 1 unit CBG 151 - 200: 2 units CBG 201 - 250: 3 units CBG 251 - 300: 5 units CBG 301 - 350: 7  units CBG 351 - 400: 9 units CBG > 400: call MD and obtain STAT lab verification    . linaclotide (LINZESS) 290 MCG CAPS capsule Take 1 capsule by mouth daily.    Marland Kitchen lisinopril (PRINIVIL,ZESTRIL) 5 MG tablet Take 5 mg by mouth daily.    . metFORMIN (GLUCOPHAGE) 850 MG tablet Take 850 mg by mouth daily with breakfast.    . metoprolol tartrate (LOPRESSOR) 25 MG tablet Take 1 tablet (25 mg total) by mouth 2 (two) times daily. 180 tablet 3  . nitroGLYCERIN (NITROSTAT) 0.4 MG SL tablet Place 1 tablet (0.4 mg total) under the tongue every 5 (five) minutes x 3 doses as needed for chest pain. 25 tablet 2  . tamsulosin (FLOMAX) 0.4 MG CAPS capsule Take 1 capsule (0.4 mg total) by mouth daily after breakfast.    . vitamin B-12 1000 MCG tablet Take 1 tablet (1,000 mcg total) by mouth daily.     No current facility-administered medications for this visit.     Allergies  Allergen Reactions  . Fish Allergy Swelling    ALL fish  . Gabapentin Other (See Comments)    Unknown reaction  . Betadine [Povidone Iodine] Swelling  . Iodine Swelling  . Shellfish Allergy Swelling    Facial swelling  . Sulfamethoxazole-Trimethoprim Swelling  Facial swelling  . Trimethoprim Swelling    Facial swelling  . Tramadol Other (See Comments)    Makes him crazy    Past Medical History:  Diagnosis Date  . Arthritis   . CAD (coronary artery disease)    2/19 PCI/DESx mRCA, and PDA x1. Normal EF.   Marland Kitchen Chronic pain   . Diabetes mellitus ([place])   . Hyperlipidemia   . Hypertension   . STEMI (ST elevation myocardial infarction) ([place])   . Tobacco use     Social History   Socioeconomic History  . Marital status: Married    Spouse name: Not on file  . Number of children: Not on file  . Years of education: Not on file  . Highest education level: Not on file  Occupational History  . Not on file  Social Needs  . Financial resource strain: Not on file  . Food insecurity:    Worry: Not on file    Inability: Not  on file  . Transportation needs:    Medical: Not on file    Non-medical: Not on file  Tobacco Use  . Smoking status: Former Smoker    Types: Cigarettes    Start date: 10/25/2017    Last attempt to quit: 11/15/2017    Years since quitting: 0.1  . Smokeless tobacco: Never Used  Substance and Sexual Activity  . Alcohol use: No    Frequency: Never  . Drug use: No  . Sexual activity: Not on file  Lifestyle  . Physical activity:    Days per week: Not on file    Minutes per session: Not on file  . Stress: Not on file  Relationships  . Social connections:    Talks on phone: Not on file    Gets together: Not on file    Attends religious service: Not on file    Active member of club or organization: Not on file    Attends meetings of clubs or organizations: Not on file    Relationship status: Not on file  . Intimate partner violence:    Fear of current or ex partner: Not on file    Emotionally abused: Not on file    Physically abused: Not on file    Forced sexual activity: Not on file  Other Topics Concern  . Not on file  Social History Narrative  . Not on file     Family History  Problem Relation Age of Onset  . Hypertension Father   . Heart disease Father      Review of Systems: General: negative for chills, fever, night sweats or weight changes.  Cardiovascular: negative for chest pain, dyspnea on exertion, edema, orthopnea, palpitations, paroxysmal nocturnal dyspnea or shortness of breath Dermatological: negative for rash Respiratory: negative for cough or wheezing Urologic: negative for hematuria Abdominal: negative for nausea, vomiting, diarrhea, bright red blood per rectum, melena, or hematemesis Neurologic: negative for visual changes, syncope, or dizziness All other systems reviewed and are otherwise negative except as noted above.    Blood pressure 120/64, pulse 91, height 6' (1.829 m), weight 190 lb (86.2 kg), SpO2 96 %.  General appearance: alert,  cooperative, appears older than stated age and no distress Neck: no carotid bruit and no JVD Lungs: clear to auscultation bilaterally and decreased BS Rt base Heart: regular rate and rhythm Extremities: no edema Skin: pale cool dry Neurologic: Grossly normal, guarded affect  EKG from [facility]- inferior Qs Labs including CBC, BMP, Troponin, and CXR reviewed-  WNL  ASSESSMENT AND PLAN:   Palpitations Pt seen in ED at [facility] with "chest pain" but his history sounds more like palpitations (recurrent PAF) to me.  PAF (paroxysmal atrial fibrillation) [facility]) Noted March 2019 after hip surgery  CAD S/P percutaneous coronary angioplasty Multiple PCI's- last on Feb 2019 to RCA and PDA  Closed right hip fracture [facility]) After a fall March 2019  Chronic anticoagulation CHADS VASC=3. 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  Chronic back pain .   PLAN  I suggested he increase his Lopressor to 25 mg BID. He is supposed to be wearing a 30 day monitor but yesterday it "was not working" and they had to send it back. He has a new one today. He'll f/u with [name] in July but call in the meantime if he has another episode.  Kerin Ransom PA-C 01/23/2018 5:16 PM

## 2018-01-23 NOTE — Patient Instructions (Signed)
Your physician recommends that you keep appointment you already have with Dr.Branch    INCREASE Metoprolol to 25 mg twice a day      No lab work or tests ordered today      If you need a refill on your cardiac medications before your next appointment, please call your pharmacy.       Thank you for choosing [facility] !

## 2018-01-23 NOTE — Assessment & Plan Note (Signed)
Multiple PCI's- last on Feb 2019 to RCA and PDA

## 2018-01-23 NOTE — Assessment & Plan Note (Signed)
Noted March 2019 after hip surgery

## 2018-01-23 NOTE — Assessment & Plan Note (Signed)
CHADS VASC=3. On Eliquis 

## 2018-01-24 ENCOUNTER — Telehealth: Payer: Self-pay

## 2018-01-24 NOTE — Telephone Encounter (Signed)
-----   Message from Arnoldo Lenis, MD sent at 01/24/2018 11:37 AM EDT ----- Labs look good, blood counts continue to improve  J BrancH MD

## 2018-01-24 NOTE — Telephone Encounter (Signed)
Pt notified. Voiced understanding.

## 2018-02-14 ENCOUNTER — Ambulatory Visit (INDEPENDENT_AMBULATORY_CARE_PROVIDER_SITE_OTHER): Payer: Medicare HMO

## 2018-02-14 DIAGNOSIS — R0989 Other specified symptoms and signs involving the circulatory and respiratory systems: Secondary | ICD-10-CM

## 2018-02-22 ENCOUNTER — Telehealth: Payer: Self-pay | Admitting: *Deleted

## 2018-02-22 NOTE — Telephone Encounter (Signed)
-----   Message from Arnoldo Lenis, MD sent at 02/20/2018  3:22 PM EDT ----- Mild to moderate blockages on carotid US, we will continue to monitor  Zandra Abts MD

## 2018-02-22 NOTE — Telephone Encounter (Signed)
Pt aware and voiced understanding - routed to pcp  

## 2018-03-20 ENCOUNTER — Encounter: Payer: Self-pay | Admitting: Cardiology

## 2018-03-20 ENCOUNTER — Ambulatory Visit: Payer: Medicare HMO | Admitting: Cardiology

## 2018-03-20 VITALS — BP 96/57 | HR 79 | Ht 72.0 in | Wt 193.8 lb

## 2018-03-20 DIAGNOSIS — I4891 Unspecified atrial fibrillation: Secondary | ICD-10-CM

## 2018-03-20 DIAGNOSIS — I6523 Occlusion and stenosis of bilateral carotid arteries: Secondary | ICD-10-CM | POA: Diagnosis not present

## 2018-03-20 DIAGNOSIS — I5032 Chronic diastolic (congestive) heart failure: Secondary | ICD-10-CM | POA: Diagnosis not present

## 2018-03-20 DIAGNOSIS — I2510 Atherosclerotic heart disease of native coronary artery without angina pectoris: Secondary | ICD-10-CM | POA: Diagnosis not present

## 2018-03-20 NOTE — Progress Notes (Signed)
Clinical Summary Mr. Marchant is a 72 y.o.male seen for follow up of the following meidcal problems.   1. CAD - multiple stents in the past, last stent 10/2017 to RCA and PDA.  - 10/2017 echo LVEF 55-60% - had been on triple therapy, found to be anemic during 12/2017 admission and ASA was stopped (has had some issues with hematuria around that time as well), on plavix and eliquis.   - medical therapy limited by soft bp's   - no recent chest pain, no SOB. No DOE   2. PAF - notes indicate new onset afib postop hip surgery 11/2017 - no bleeding on eliquis - taking lopressor 12.5mg  bid  3. Chronic diastolic HF - no recent edema. No SOB/DOE  4. Chronic anemia.   5. Carotid stenosis - 01/2018 carotid US RICA 1-61%, LICA 09-60% - no recent neuro symptoms    Past Medical History:  Diagnosis Date  . Arthritis   . CAD (coronary artery disease)    2/19 PCI/DESx mRCA, and PDA x1. Normal EF.   Marland Kitchen Chronic pain   . Diabetes mellitus ([place])   . Hyperlipidemia   . Hypertension   . STEMI (ST elevation myocardial infarction) ([place])   . Tobacco use      Allergies  Allergen Reactions  . Fish Allergy Swelling    ALL fish  . Gabapentin Other (See Comments)    Unknown reaction  . Betadine [Povidone Iodine] Swelling  . Iodine Swelling  . Shellfish Allergy Swelling    Facial swelling  . Sulfamethoxazole-Trimethoprim Swelling    Facial swelling  . Trimethoprim Swelling    Facial swelling  . Tramadol Other (See Comments)    Makes him crazy     Current Outpatient Medications  Medication Sig Dispense Refill  . acetaminophen (TYLENOL) 325 MG tablet Take 2 tablets (650 mg total) by mouth every 6 (six) hours as needed for mild pain, fever or headache.    Marland Kitchen apixaban (ELIQUIS) 5 MG TABS tablet Take 1 tablet (5 mg total) by mouth 2 (two) times daily. 60 tablet 3  . atorvastatin (LIPITOR) 80 MG tablet Take 1 tablet (80 mg total) by mouth daily at 6 PM. 90 tablet 1  . clopidogrel  (PLAVIX) 75 MG tablet Take 1 tablet (75 mg total) by mouth daily with breakfast. 90 tablet 3  . HYDROcodone-acetaminophen (NORCO/VICODIN) 5-325 MG tablet Take 1 tablet by mouth every 6 (six) hours as needed for moderate pain or severe pain. 15 tablet 0  . insulin aspart (NOVOLOG) 100 UNIT/ML injection Inject 0-9 Units into the skin 3 (three) times daily with meals. CBG < 70: implement hypoglycemia protocol CBG 70 - 120: 0 units CBG 121 - 150: 1 unit CBG 151 - 200: 2 units CBG 201 - 250: 3 units CBG 251 - 300: 5 units CBG 301 - 350: 7 units CBG 351 - 400: 9 units CBG > 400: call MD and obtain STAT lab verification    . linaclotide (LINZESS) 290 MCG CAPS capsule Take 1 capsule by mouth daily.    Marland Kitchen lisinopril (PRINIVIL,ZESTRIL) 5 MG tablet Take 5 mg by mouth daily.    . metFORMIN (GLUCOPHAGE) 850 MG tablet Take 850 mg by mouth daily with breakfast.    . metoprolol tartrate (LOPRESSOR) 25 MG tablet Take 1 tablet (25 mg total) by mouth 2 (two) times daily. 180 tablet 3  . nitroGLYCERIN (NITROSTAT) 0.4 MG SL tablet Place 1 tablet (0.4 mg total) under the tongue every 5 (  five) minutes x 3 doses as needed for chest pain. 25 tablet 2  . tamsulosin (FLOMAX) 0.4 MG CAPS capsule Take 1 capsule (0.4 mg total) by mouth daily after breakfast.    . vitamin B-12 1000 MCG tablet Take 1 tablet (1,000 mcg total) by mouth daily.     No current facility-administered medications for this visit.      Past Surgical History:  Procedure Laterality Date  . CORONARY STENT INTERVENTION N/A 11/13/2017   Procedure: CORONARY STENT INTERVENTION;  Surgeon: Martinique, Peter M, MD;  Location: [facility];  Service: Cardiovascular;  Laterality: N/A;  . CORONARY/GRAFT ACUTE MI REVASCULARIZATION N/A 11/13/2017   Procedure: Coronary/Graft Acute MI Revascularization;  Surgeon: Martinique, Peter M, MD;  Location: [facility];  Service: Cardiovascular;  Laterality: N/A;  . LEFT HEART CATH AND CORONARY ANGIOGRAPHY N/A  11/13/2017   Procedure: LEFT HEART CATH AND CORONARY ANGIOGRAPHY;  Surgeon: Martinique, Peter M, MD;  Location: [facility];  Service: Cardiovascular;  Laterality: N/A;  . TOTAL HIP ARTHROPLASTY Right 12/18/2017   Procedure: TOTAL HIP ARTHROPLASTY ANTERIOR APPROACH;  Surgeon: Rod Can, MD;  Location: [facility];  Service: Orthopedics;  Laterality: Right;     Allergies  Allergen Reactions  . Fish Allergy Swelling    ALL fish  . Gabapentin Other (See Comments)    Unknown reaction  . Betadine [Povidone Iodine] Swelling  . Iodine Swelling  . Shellfish Allergy Swelling    Facial swelling  . Sulfamethoxazole-Trimethoprim Swelling    Facial swelling  . Trimethoprim Swelling    Facial swelling  . Tramadol Other (See Comments)    Makes him crazy      Family History  Problem Relation Age of Onset  . Hypertension Father   . Heart disease Father      Social History Mr. Mortellaro reports that he quit smoking about 4 months ago. His smoking use included cigarettes. He started smoking about 4 months ago. He has never used smokeless tobacco. Mr. Schroll reports that he does not drink alcohol.   Review of Systems CONSTITUTIONAL: No weight loss, fever, chills, weakness or fatigue.  HEENT: Eyes: No visual loss, blurred vision, double vision or yellow sclerae.No hearing loss, sneezing, congestion, runny nose or sore throat.  SKIN: No rash or itching.  CARDIOVASCULAR: per hpi RESPIRATORY: No shortness of breath, cough or sputum.  GASTROINTESTINAL: No anorexia, nausea, vomiting or diarrhea. No abdominal pain or blood.  GENITOURINARY: No burning on urination, no polyuria NEUROLOGICAL: No headache, dizziness, syncope, paralysis, ataxia, numbness or tingling in the extremities. No change in bowel or bladder control.  MUSCULOSKELETAL: No muscle, back pain, joint pain or stiffness.  LYMPHATICS: No enlarged nodes. No history of splenectomy.  PSYCHIATRIC: No history of depression or anxiety.    ENDOCRINOLOGIC: No reports of sweating, cold or heat intolerance. No polyuria or polydipsia.  Marland Kitchen   Physical Examination Vitals:   03/20/18 1459  BP: (!) 96/57  Pulse: 79  SpO2: 98%   Vitals:   03/20/18 1459  Weight: 193 lb 12.8 oz (87.9 kg)  Height: 6' (1.829 m)    Gen: resting comfortably, no acute distress HEENT: no scleral icterus, pupils equal round and reactive, no palptable cervical adenopathy,  CV: RRR, no m/r/g, no jvd Resp: Clear to auscultation bilaterally GI: abdomen is soft, non-tender, non-distended, normal bowel sounds, no hepatosplenomegaly MSK: extremities are warm, no edema.  Skin: warm, no rash Neuro:  no focal deficits Psych: appropriate affect   Diagnostic Studies 10/2017  cath  Mid RCA lesion is 100% stenosed.  Dist RCA lesion is 40% stenosed.  Prox RCA lesion is 50% stenosed.  Ost RPDA to RPDA lesion is 99% stenosed.  Prox LAD to Mid LAD lesion is 60% stenosed.  Ost 1st Diag lesion is 90% stenosed.  Ost 2nd Diag to 2nd Diag lesion is 85% stenosed.  Ost 2nd Mrg to 2nd Mrg lesion is 40% stenosed.  Ost Cx to Prox Cx lesion is 40% stenosed.  Ost 1st Mrg to 1st Mrg lesion is 70% stenosed.  The left ventricular systolic function is normal.  LV end diastolic pressure is mildly elevated.  The left ventricular ejection fraction is 55-65% by visual estimate.  Post intervention, there is a 0% residual stenosis.  A drug-eluting stent was successfully placed using a STENT SYNERGY DES 2.25X28.  Post intervention, there is a 0% residual stenosis.  A drug-eluting stent was successfully placed using a STENT SYNERGY DES 3X20.  1. 3 vessel obstructive CAD. - diffuse 60% proximal to mid LAD with severe disease in small diagonal branches. - 70% OM1. Stent in OM 2 is diffusely diseased but patent - 100% mid RCA and 99% PDA 2. Good LV function 3. Mildly elevated LVEDP 4. Successful stenting of the PDA and mid RCA with DES x 2. Diffusely  diseased and heavily calcified RCA.  Plan: DAPT indefinitely. Aggressive risk factor modification and smoking cessation.    10/2017 echo Study Conclusions  - Left ventricle: The cavity size was normal. Systolic function was normal. The estimated ejection fraction was in the range of 55% to 60%. Possible mild hypokinesis of the basal-midinferior myocardium. - Left atrium: The atrium was mildly dilated.       Assessment and Plan   1. CAD - no symptoms, conitnue current meds  2. Afib - new onset after hip surgery 11/2017 - unclear if isolated event after surgery or if true PAF. EKG today shows NSR - he was not comfortable wearing monitor and did not complete - continue current meds  3. Chronic diastolic HF - no symptoms, continue current meds  4. Carotid stenosis -no symptoms, continue medical therapy.         Arnoldo Lenis, M.D.

## 2018-03-20 NOTE — Patient Instructions (Signed)
Medication Instructions:  Your physician recommends that you continue on your current medications as directed. Please refer to the Current Medication list given to you today.  Labwork: NONE  Testing/Procedures: NONE  Follow-Up: Your physician wants you to follow-up in: 4 MONTHS WITH DR. BRANCH You will receive a reminder letter in the mail two months in advance. If you don't receive a letter, please call our office to schedule the follow-up appointment.  Any Other Special Instructions Will Be Listed Below (If Applicable).  If you need a refill on your cardiac medications before your next appointment, please call your pharmacy. 

## 2018-03-26 ENCOUNTER — Encounter: Payer: Self-pay | Admitting: Cardiology

## 2018-07-25 ENCOUNTER — Encounter: Payer: Self-pay | Admitting: *Deleted

## 2018-07-25 ENCOUNTER — Ambulatory Visit: Payer: Managed Care, Other (non HMO) | Admitting: Cardiology

## 2018-07-25 ENCOUNTER — Telehealth: Payer: Self-pay | Admitting: Physician Assistant

## 2018-07-25 ENCOUNTER — Encounter: Payer: Self-pay | Admitting: Cardiology

## 2018-07-25 VITALS — BP 121/68 | HR 71 | Ht 72.0 in | Wt 202.6 lb

## 2018-07-25 DIAGNOSIS — I4891 Unspecified atrial fibrillation: Secondary | ICD-10-CM

## 2018-07-25 DIAGNOSIS — I5032 Chronic diastolic (congestive) heart failure: Secondary | ICD-10-CM

## 2018-07-25 DIAGNOSIS — I2510 Atherosclerotic heart disease of native coronary artery without angina pectoris: Secondary | ICD-10-CM | POA: Diagnosis not present

## 2018-07-25 MED ORDER — CLOPIDOGREL BISULFATE 75 MG PO TABS: 75.0000 mg | ORAL_TABLET | Freq: Every day | ORAL | 3 refills | Status: DC

## 2018-07-25 NOTE — Progress Notes (Signed)
Clinical Summary Dennis [name] is a 72 y.o.male seen for follow up of the following meidcal problems.  1. CAD - multiple stents in the past, last stent 10/2017 to RCA and PDA. - 10/2017 echo LVEF 55-60% - had been on triple therapy, found to be anemic during 12/2017 admission and ASA was stopped (has had some issues with hematuria around that time as well), on plavix and eliquis.  - medical therapy limited by soft bp's  - has some positional chest pain. No other symptoms - compliatnt with meds - last visit on eliquis and plavix, now on plavix and ASA 325mg  daily. He is not sure who made changes or how meds were changed.    2. PAF - notes indicate new onset afib postop hip surgery 11/2017 -had been on eliquis but somehow off now, he is not sure history - no recent palpitations.   3. Chronic diastolic HF - no recent SOB/DOE.   4. Chronic anemia - no recent bleeding.   5. Carotid stenosis - 01/2018 carotid US RICA 9-32%, LICA 35-57% - no recent neuro symptoms     Past Medical History:  Diagnosis Date  . Arthritis   . CAD (coronary artery disease)    2/19 PCI/DESx mRCA, and PDA x1. Normal EF.   Marland Kitchen Chronic pain   . Diabetes mellitus ([place])   . Hyperlipidemia   . Hypertension   . STEMI (ST elevation myocardial infarction) ([place])   . Tobacco use      Allergies  Allergen Reactions  . Fish Allergy Swelling    ALL fish  . Gabapentin Other (See Comments)    Unknown reaction  . Betadine [Povidone Iodine] Swelling  . Iodine Swelling  . Shellfish Allergy Swelling    Facial swelling  . Sulfamethoxazole-Trimethoprim Swelling    Facial swelling  . Trimethoprim Swelling    Facial swelling  . Tramadol Other (See Comments)    Makes him crazy     Current Outpatient Medications  Medication Sig Dispense Refill  . acetaminophen (TYLENOL) 325 MG tablet Take 2 tablets (650 mg total) by mouth every 6 (six) hours as needed for mild pain, fever or headache.    Marland Kitchen apixaban  (ELIQUIS) 5 MG TABS tablet Take 1 tablet (5 mg total) by mouth 2 (two) times daily. (Patient not taking: Reported on 03/20/2018) 60 tablet 3  . atorvastatin (LIPITOR) 80 MG tablet Take 1 tablet (80 mg total) by mouth daily at 6 PM. 90 tablet 1  . clopidogrel (PLAVIX) 75 MG tablet Take 1 tablet (75 mg total) by mouth daily with breakfast. 90 tablet 3  . HYDROcodone-acetaminophen (NORCO/VICODIN) 5-325 MG tablet Take 1 tablet by mouth every 6 (six) hours as needed for moderate pain or severe pain. 15 tablet 0  . insulin aspart (NOVOLOG) 100 UNIT/ML injection Inject 0-9 Units into the skin 3 (three) times daily with meals. CBG < 70: implement hypoglycemia protocol CBG 70 - 120: 0 units CBG 121 - 150: 1 unit CBG 151 - 200: 2 units CBG 201 - 250: 3 units CBG 251 - 300: 5 units CBG 301 - 350: 7 units CBG 351 - 400: 9 units CBG > 400: call MD and obtain STAT lab verification    . linaclotide (LINZESS) 290 MCG CAPS capsule Take 290 mcg by mouth as needed.    Marland Kitchen lisinopril (PRINIVIL,ZESTRIL) 5 MG tablet Take 5 mg by mouth daily.    . metFORMIN (GLUCOPHAGE) 850 MG tablet Take 850 mg by mouth daily  with breakfast.    . metoprolol tartrate (LOPRESSOR) 25 MG tablet Take 1 tablet (25 mg total) by mouth 2 (two) times daily. 180 tablet 3  . nitroGLYCERIN (NITROSTAT) 0.4 MG SL tablet Place 1 tablet (0.4 mg total) under the tongue every 5 (five) minutes x 3 doses as needed for chest pain. 25 tablet 2  . tamsulosin (FLOMAX) 0.4 MG CAPS capsule Take 1 capsule (0.4 mg total) by mouth daily after breakfast.    . vitamin B-12 1000 MCG tablet Take 1 tablet (1,000 mcg total) by mouth daily.     No current facility-administered medications for this visit.      Past Surgical History:  Procedure Laterality Date  . CORONARY STENT INTERVENTION N/A 11/13/2017   Procedure: CORONARY STENT INTERVENTION;  Surgeon: Martinique, Peter M, MD;  Location: [facility];  Service: Cardiovascular;  Laterality: N/A;  . CORONARY/GRAFT  ACUTE MI REVASCULARIZATION N/A 11/13/2017   Procedure: Coronary/Graft Acute MI Revascularization;  Surgeon: Martinique, Peter M, MD;  Location: [facility];  Service: Cardiovascular;  Laterality: N/A;  . LEFT HEART CATH AND CORONARY ANGIOGRAPHY N/A 11/13/2017   Procedure: LEFT HEART CATH AND CORONARY ANGIOGRAPHY;  Surgeon: Martinique, Peter M, MD;  Location: [facility];  Service: Cardiovascular;  Laterality: N/A;  . TOTAL HIP ARTHROPLASTY Right 12/18/2017   Procedure: TOTAL HIP ARTHROPLASTY ANTERIOR APPROACH;  Surgeon: Rod Can, MD;  Location: [facility];  Service: Orthopedics;  Laterality: Right;     Allergies  Allergen Reactions  . Fish Allergy Swelling    ALL fish  . Gabapentin Other (See Comments)    Unknown reaction  . Betadine [Povidone Iodine] Swelling  . Iodine Swelling  . Shellfish Allergy Swelling    Facial swelling  . Sulfamethoxazole-Trimethoprim Swelling    Facial swelling  . Trimethoprim Swelling    Facial swelling  . Tramadol Other (See Comments)    Makes him crazy      Family History  Problem Relation Age of Onset  . Hypertension Father   . Heart disease Father      Social History Dennis [name] reports that he quit smoking about 8 months ago. His smoking use included cigarettes. He started smoking about 8 months ago. He has never used smokeless tobacco. Dennis [name] reports that he does not drink alcohol.   Review of Systems CONSTITUTIONAL: No weight loss, fever, chills, weakness or fatigue.  HEENT: Eyes: No visual loss, blurred vision, double vision or yellow sclerae.No hearing loss, sneezing, congestion, runny nose or sore throat.  SKIN: No rash or itching.  CARDIOVASCULAR: per hpi RESPIRATORY: No shortness of breath, cough or sputum.  GASTROINTESTINAL: No anorexia, nausea, vomiting or diarrhea. No abdominal pain or blood.  GENITOURINARY: No burning on urination, no polyuria NEUROLOGICAL: No headache, dizziness, syncope, paralysis, ataxia, numbness or  tingling in the extremities. No change in bowel or bladder control.  MUSCULOSKELETAL: No muscle, back pain, joint pain or stiffness.  LYMPHATICS: No enlarged nodes. No history of splenectomy.  PSYCHIATRIC: No history of depression or anxiety.  ENDOCRINOLOGIC: No reports of sweating, cold or heat intolerance. No polyuria or polydipsia.  Marland Kitchen   Physical Examination Vitals:   07/25/18 1417  BP: 121/68  Pulse: 71   Vitals:   07/25/18 1417  Weight: 202 lb 9.6 oz (91.9 kg)  Height: 6' (1.829 m)    Gen: resting comfortably, no acute distress HEENT: no scleral icterus, pupils equal round and reactive, no palptable cervical adenopathy,  CV: RRR, no m/r/g, no  jvd Resp: Clear to auscultation bilaterally GI: abdomen is soft, non-tender, non-distended, normal bowel sounds, no hepatosplenomegaly MSK: extremities are warm, no edema.  Skin: warm, no rash Neuro:  no focal deficits Psych: appropriate affect   Diagnostic Studies 10/2017 cath  Mid RCA lesion is 100% stenosed.  Dist RCA lesion is 40% stenosed.  Prox RCA lesion is 50% stenosed.  Ost RPDA to RPDA lesion is 99% stenosed.  Prox LAD to Mid LAD lesion is 60% stenosed.  Ost 1st Diag lesion is 90% stenosed.  Ost 2nd Diag to 2nd Diag lesion is 85% stenosed.  Ost 2nd Mrg to 2nd Mrg lesion is 40% stenosed.  Ost Cx to Prox Cx lesion is 40% stenosed.  Ost 1st Mrg to 1st Mrg lesion is 70% stenosed.  The left ventricular systolic function is normal.  LV end diastolic pressure is mildly elevated.  The left ventricular ejection fraction is 55-65% by visual estimate.  Post intervention, there is a 0% residual stenosis.  A drug-eluting stent was successfully placed using a STENT SYNERGY DES 2.25X28.  Post intervention, there is a 0% residual stenosis.  A drug-eluting stent was successfully placed using a STENT SYNERGY DES 3X20.  1. 3 vessel obstructive CAD. - diffuse 60% proximal to mid LAD with severe disease in small  diagonal branches. - 70% OM1. Stent in OM 2 is diffusely diseased but patent - 100% mid RCA and 99% PDA 2. Good LV function 3. Mildly elevated LVEDP 4. Successful stenting of the PDA and mid RCA with DES x 2. Diffusely diseased and heavily calcified RCA.  Plan: DAPT indefinitely. Aggressive risk factor modification and smoking cessation.   10/2017 echo Study Conclusions  - Left ventricle: The cavity size was normal. Systolic function was normal. The estimated ejection fraction was in the range of 55% to 60%. Possible mild hypokinesis of the basal-midinferior myocardium. - Left atrium: The atrium was mildly dilated.      Assessment and Plan  1. CAD -no recent symptoms - continue current meds - change ASA to 81mg  daily, continue plavix 75mg  daily.   2. Afib - new onset after hip surgery 11/2017 - unclear if isolated event after surgery or if true PAF. EKG today shows NSR - he was not comfortable wearing monitor and did not complete - had been on eliquis, unclear how he is off now and on ASA 325mg  daily. Patient does not know, no documentation in [facility] about changes - continue ASA and plavix for now, if recurrence of afib will need to restart eliquis.   3. Chronic diastolic HF - no recent symptoms, continue current meds  4. Carotid stenosis -continue medical therapy.      Arnoldo Lenis, M.D.

## 2018-07-25 NOTE — Patient Instructions (Signed)
Your physician wants you to follow-up in: [place] will receive a reminder letter in the mail two months in advance. If you don't receive a letter, please call our office to schedule the follow-up appointment.  Your physician has recommended you make the following change in your medication:   CHANGE ASPIRIN 81 MG DAILY   Thank you for choosing [facility]!!

## 2018-07-25 NOTE — Telephone Encounter (Signed)
Dennis [name] at [facility] advised that patient is taking plavix 75 mg. Rx phoned in.

## 2018-07-25 NOTE — Telephone Encounter (Signed)
New message  Pt c/o medication issue:  1. Name of Medication: Plavix  2. How are you currently taking this medication (dosage and times per day)? 1 time daily in morning  3. Are you having a reaction (difficulty breathing--STAT)? No   4. What is your medication issue? Pharmacy needs to know if this medication has been discontinued by doctor? Please advise.

## 2019-01-31 ENCOUNTER — Telehealth (INDEPENDENT_AMBULATORY_CARE_PROVIDER_SITE_OTHER): Payer: Medicare HMO | Admitting: Cardiology

## 2019-01-31 ENCOUNTER — Encounter: Payer: Self-pay | Admitting: Cardiology

## 2019-01-31 VITALS — BP 134/76 | HR 75 | Ht 72.0 in | Wt 206.0 lb

## 2019-01-31 DIAGNOSIS — I6523 Occlusion and stenosis of bilateral carotid arteries: Secondary | ICD-10-CM

## 2019-01-31 DIAGNOSIS — I5032 Chronic diastolic (congestive) heart failure: Secondary | ICD-10-CM

## 2019-01-31 DIAGNOSIS — M79605 Pain in left leg: Secondary | ICD-10-CM

## 2019-01-31 DIAGNOSIS — I2510 Atherosclerotic heart disease of native coronary artery without angina pectoris: Secondary | ICD-10-CM | POA: Diagnosis not present

## 2019-01-31 DIAGNOSIS — M79604 Pain in right leg: Secondary | ICD-10-CM

## 2019-01-31 DIAGNOSIS — I4891 Unspecified atrial fibrillation: Secondary | ICD-10-CM

## 2019-01-31 NOTE — Progress Notes (Signed)
Virtual Visit via Telephone Note   This visit type was conducted due to national recommendations for restrictions regarding the COVID-19 Pandemic (e.g. social distancing) in an effort to limit this patient's exposure and mitigate transmission in our community.  Due to his co-morbid illnesses, this patient is at least at moderate risk for complications without adequate follow up.  This format is felt to be most appropriate for this patient at this time.  The patient did not have access to video technology/had technical difficulties with video requiring transitioning to audio format only (telephone).  All issues noted in this document were discussed and addressed.  No physical exam could be performed with this format.  Please refer to the patient's chart for his  consent to telehealth for [facility]   Date:  01/31/2019   ID:  [name], DOB 12-31-1945, MRN [id number]  Patient Location: Home Provider Location: Home  PCP:  Glenda Chroman, MD  Cardiologist:  Carlyle Dolly, MD  Electrophysiologist:  None   Evaluation Performed:  Follow-Up Visit  Chief Complaint:  4 month follow up  History of Present Illness:    INDIANA PECHACEK is a 73 y.o. male with seen for follow up of the following meidcal problems.  1. CAD - multiple stents in the past, last stent 10/2017 to RCA and PDA. - 10/2017 echo LVEF 55-60% - had been on triple therapy, found to be anemic during 12/2017 admission and ASA was stopped (has had some issues with hematuria around that time as well), on plavix and eliquis.  - medical therapy limited by soft bp's   - mild chest pain at times with position changes. No recent SOb/DOE - compliant with meds   2. Afib - notes indicate new onset afib postop hip surgery 11/2017  -he was not comfortable wearing monitor and did not complete -had been on eliquis but somehow off now, he is not sure history   - no recent palpitations.    3. Chronic diastolic HF -  no recent SOB/DOE.   - has some intermittent swelling in his legs. Ok in morning, develops throughtou the day.  - has prn lasix.     4. Carotid stenosis - 01/2018 carotid US RICA 6-26%, LICA 94-85% - no recent neuro symptoms  6. Leg pains - bilateral swelling, some discomfort in calves. Difficult to discern history, unclear if just tightness from swelling of claudication    The patient does not have symptoms concerning for COVID-19 infection (fever, chills, cough, or new shortness of breath).    Past Medical History:  Diagnosis Date  . Arthritis   . CAD (coronary artery disease)    2/19 PCI/DESx mRCA, and PDA x1. Normal EF.   Marland Kitchen Chronic pain   . Diabetes mellitus ([place])   . Hyperlipidemia   . Hypertension   . STEMI (ST elevation myocardial infarction) ([place])   . Tobacco use    Past Surgical History:  Procedure Laterality Date  . CORONARY STENT INTERVENTION N/A 11/13/2017   Procedure: CORONARY STENT INTERVENTION;  Surgeon: Martinique, Peter M, MD;  Location: [place] [facility];  Service: Cardiovascular;  Laterality: N/A;  . CORONARY/GRAFT ACUTE MI REVASCULARIZATION N/A 11/13/2017   Procedure: Coronary/Graft Acute MI Revascularization;  Surgeon: Martinique, Peter M, MD;  Location: [place] [facility];  Service: Cardiovascular;  Laterality: N/A;  . LEFT HEART CATH AND CORONARY ANGIOGRAPHY N/A 11/13/2017   Procedure: LEFT HEART CATH AND CORONARY ANGIOGRAPHY;  Surgeon: Martinique, Peter M, MD;  Location[facility]  INVASIVE CV LAB;  Service: Cardiovascular;  Laterality: N/A;  . TOTAL HIP ARTHROPLASTY Right 12/18/2017   Procedure: TOTAL HIP ARTHROPLASTY ANTERIOR APPROACH;  Surgeon: Rod Can, MD;  Location: [place];  Service: Orthopedics;  Laterality: Right;     No outpatient medications have been marked as taking for the 01/31/19 encounter (Appointment) with Arnoldo Lenis, MD.     Allergies:   Fish allergy; Gabapentin; Betadine [povidone iodine]; Iodine; Shellfish allergy;  Sulfamethoxazole-trimethoprim; Trimethoprim; and Tramadol   Social History   Tobacco Use  . Smoking status: Former Smoker    Types: Cigarettes    Start date: 10/25/2017    Last attempt to quit: 11/15/2017    Years since quitting: 1.2  . Smokeless tobacco: Never Used  Substance Use Topics  . Alcohol use: No    Frequency: Never  . Drug use: No     Family Hx: The patient's family history includes Heart disease in his father; Hypertension in his father.  ROS:   Please see the history of present illness.     All other systems reviewed and are negative.   Prior CV studies:   The following studies were reviewed today:  10/2017 cath  Mid RCA lesion is 100% stenosed.  Dist RCA lesion is 40% stenosed.  Prox RCA lesion is 50% stenosed.  Ost RPDA to RPDA lesion is 99% stenosed.  Prox LAD to Mid LAD lesion is 60% stenosed.  Ost 1st Diag lesion is 90% stenosed.  Ost 2nd Diag to 2nd Diag lesion is 85% stenosed.  Ost 2nd Mrg to 2nd Mrg lesion is 40% stenosed.  Ost Cx to Prox Cx lesion is 40% stenosed.  Ost 1st Mrg to 1st Mrg lesion is 70% stenosed.  The left ventricular systolic function is normal.  LV end diastolic pressure is mildly elevated.  The left ventricular ejection fraction is 55-65% by visual estimate.  Post intervention, there is a 0% residual stenosis.  A drug-eluting stent was successfully placed using a STENT SYNERGY DES 2.25X28.  Post intervention, there is a 0% residual stenosis.  A drug-eluting stent was successfully placed using a STENT SYNERGY DES 3X20.  1. 3 vessel obstructive CAD. - diffuse 60% proximal to mid LAD with severe disease in small diagonal branches. - 70% OM1. Stent in OM 2 is diffusely diseased but patent - 100% mid RCA and 99% PDA 2. Good LV function 3. Mildly elevated LVEDP 4. Successful stenting of the PDA and mid RCA with DES x 2. Diffusely diseased and heavily calcified RCA.  Plan: DAPT indefinitely. Aggressive  risk factor modification and smoking cessation.   10/2017 echo Study Conclusions  - Left ventricle: The cavity size was normal. Systolic function was normal. The estimated ejection fraction was in the range of 55% to 60%. Possible mild hypokinesis of the basal-midinferior myocardium. - Left atrium: The atrium was mildly dilated.  Labs/Other Tests and Data Reviewed:    EKG:  na  Recent Labs: No results found for requested labs within last 8760 hours.   Recent Lipid Panel Lab Results  Component Value Date/Time   CHOL 117 11/14/2017 05:00 AM   TRIG 72 11/14/2017 05:00 AM   HDL 33 (L) 11/14/2017 05:00 AM   CHOLHDL 3.5 11/14/2017 05:00 AM   LDLCALC 70 11/14/2017 05:00 AM    Wt Readings from Last 3 Encounters:  07/25/18 202 lb 9.6 oz (91.9 kg)  03/20/18 193 lb 12.8 oz (87.9 kg)  01/23/18 190 lb (86.2 kg)     Objective:  Vital Signs:   Today's Vitals   01/31/19 1309  BP: 134/76  Pulse: 75  Weight: 206 lb (93.4 kg)  Height: 6' (1.829 m)   Body mass index is 27.94 kg/m.  Normal affect. Normal speech pattern and tone. Comfortable, no apparent distress. No audible signs of SOB or wheezing.   ASSESSMENT & PLAN:    1. CAD -no recent symptoms. Due to his extensive CAD history and multiple interventions we will continue DAPT indefinitely.   2. Afib - new onset after hip surgery 11/2017 - unclear if isolated event after surgery or if true PAF. EKG today shows NSR -he was not comfortable wearing monitor and did not complete - had been on eliquis, unclear how he is off   - monitor for recurrence, if detected would need to commit to anticoag  3. Chronic diastolic HF - continue prn lasix, asked to take more frequentlty due to some recent swelling.   4. Carotidstenosis -continue medical therapy at this time  5. Leg pains - some swelling and leg "tightness", difficult to discern from the history he gives if discomfort is from swelling alone or if having  some claudication - follow with more regular diuretic use, may consider ABIs in the future.   COVID-19 Education: The signs and symptoms of COVID-19 were discussed with the patient and how to seek care for testing (follow up with PCP or arrange E-visit).   The importance of social distancing was discussed today.  Time:   Today, I have spent 20  minutes with the patient with telehealth technology discussing the above problems.     Medication Adjustments/Labs and Tests Ordered: Current medicines are reviewed at length with the patient today.  Concerns regarding medicines are outlined above.   Tests Ordered: No orders of the defined types were placed in this encounter.   Medication Changes: No orders of the defined types were placed in this encounter.   Disposition:  Follow up 3 months  Signed, Carlyle Dolly, MD  01/31/2019 11:46 AM    [place]

## 2019-01-31 NOTE — Patient Instructions (Signed)
Your physician recommends that you schedule a follow-up appointment in: 3 MONTHS WITH DR BRANCH  Your physician recommends that you continue on your current medications as directed. Please refer to the Current Medication list given to you today.  Thank you for choosing [facility]!!    

## 2019-03-25 ENCOUNTER — Other Ambulatory Visit: Payer: Self-pay | Admitting: Cardiology

## 2019-05-03 ENCOUNTER — Ambulatory Visit: Payer: Medicare HMO | Admitting: Cardiology

## 2019-05-22 ENCOUNTER — Other Ambulatory Visit: Payer: Self-pay | Admitting: *Deleted

## 2019-05-22 ENCOUNTER — Encounter: Payer: Self-pay | Admitting: *Deleted

## 2019-05-22 ENCOUNTER — Encounter: Payer: Self-pay | Admitting: Vascular Surgery

## 2019-05-22 ENCOUNTER — Other Ambulatory Visit: Payer: Self-pay

## 2019-05-22 ENCOUNTER — Ambulatory Visit: Payer: Medicare HMO | Admitting: Vascular Surgery

## 2019-05-22 ENCOUNTER — Ambulatory Visit (HOSPITAL_COMMUNITY)
Admission: RE | Admit: 2019-05-22 | Discharge: 2019-05-22 | Disposition: A | Discharge status: Home / Self Care | Payer: Medicare HMO | Source: Ambulatory Visit | Attending: Vascular Surgery | Admitting: Vascular Surgery

## 2019-05-22 VITALS — BP 126/75 | HR 77 | Temp 97.9°F | Resp 14 | Ht 72.0 in | Wt 211.9 lb

## 2019-05-22 DIAGNOSIS — I739 Peripheral vascular disease, unspecified: Secondary | ICD-10-CM

## 2019-05-22 MED ORDER — ROSUVASTATIN CALCIUM 20 MG PO TABS: 20.0000 mg | ORAL_TABLET | Freq: Every day | ORAL | 12 refills | Status: DC
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
## 2019-06-07 ENCOUNTER — Ambulatory Visit (HOSPITAL_BASED_OUTPATIENT_CLINIC_OR_DEPARTMENT_OTHER): Payer: Medicare HMO

## 2019-06-07 ENCOUNTER — Encounter (HOSPITAL_COMMUNITY)
Admission: RE | Disposition: A | Discharge status: Home / Self Care | Payer: Self-pay | Source: Home / Self Care | Attending: Vascular Surgery

## 2019-06-07 ENCOUNTER — Ambulatory Visit (HOSPITAL_COMMUNITY)
Admission: RE | Admit: 2019-06-07 | Discharge: 2019-06-07 | Disposition: A | Discharge status: Home / Self Care | Payer: Medicare HMO | Attending: Vascular Surgery | Admitting: Vascular Surgery

## 2019-06-07 DIAGNOSIS — Z7982 Long term (current) use of aspirin: Secondary | ICD-10-CM | POA: Insufficient documentation

## 2019-06-07 DIAGNOSIS — Z794 Long term (current) use of insulin: Secondary | ICD-10-CM | POA: Insufficient documentation

## 2019-06-07 DIAGNOSIS — Z885 Allergy status to narcotic agent status: Secondary | ICD-10-CM | POA: Diagnosis not present

## 2019-06-07 DIAGNOSIS — E1151 Type 2 diabetes mellitus with diabetic peripheral angiopathy without gangrene: Secondary | ICD-10-CM | POA: Diagnosis present

## 2019-06-07 DIAGNOSIS — I70235 Atherosclerosis of native arteries of right leg with ulceration of other part of foot: Secondary | ICD-10-CM

## 2019-06-07 DIAGNOSIS — Z7902 Long term (current) use of antithrombotics/antiplatelets: Secondary | ICD-10-CM | POA: Diagnosis not present

## 2019-06-07 DIAGNOSIS — Z79891 Long term (current) use of opiate analgesic: Secondary | ICD-10-CM | POA: Insufficient documentation

## 2019-06-07 DIAGNOSIS — Z888 Allergy status to other drugs, medicaments and biological substances status: Secondary | ICD-10-CM | POA: Diagnosis not present

## 2019-06-07 DIAGNOSIS — I252 Old myocardial infarction: Secondary | ICD-10-CM | POA: Insufficient documentation

## 2019-06-07 DIAGNOSIS — E11621 Type 2 diabetes mellitus with foot ulcer: Secondary | ICD-10-CM | POA: Insufficient documentation

## 2019-06-07 DIAGNOSIS — Z79899 Other long term (current) drug therapy: Secondary | ICD-10-CM | POA: Diagnosis not present

## 2019-06-07 DIAGNOSIS — Z87891 Personal history of nicotine dependence: Secondary | ICD-10-CM | POA: Insufficient documentation

## 2019-06-07 DIAGNOSIS — I739 Peripheral vascular disease, unspecified: Secondary | ICD-10-CM

## 2019-06-07 DIAGNOSIS — Z8249 Family history of ischemic heart disease and other diseases of the circulatory system: Secondary | ICD-10-CM | POA: Insufficient documentation

## 2019-06-07 DIAGNOSIS — E785 Hyperlipidemia, unspecified: Secondary | ICD-10-CM | POA: Diagnosis not present

## 2019-06-07 DIAGNOSIS — Z881 Allergy status to other antibiotic agents status: Secondary | ICD-10-CM | POA: Diagnosis not present

## 2019-06-07 DIAGNOSIS — I771 Stricture of artery: Secondary | ICD-10-CM | POA: Insufficient documentation

## 2019-06-07 DIAGNOSIS — Z96641 Presence of right artificial hip joint: Secondary | ICD-10-CM | POA: Diagnosis not present

## 2019-06-07 DIAGNOSIS — M1990 Unspecified osteoarthritis, unspecified site: Secondary | ICD-10-CM | POA: Insufficient documentation

## 2019-06-07 DIAGNOSIS — G8929 Other chronic pain: Secondary | ICD-10-CM | POA: Diagnosis not present

## 2019-06-07 DIAGNOSIS — I10 Essential (primary) hypertension: Secondary | ICD-10-CM | POA: Insufficient documentation

## 2019-06-07 DIAGNOSIS — I2510 Atherosclerotic heart disease of native coronary artery without angina pectoris: Secondary | ICD-10-CM | POA: Insufficient documentation

## 2019-06-07 DIAGNOSIS — Z955 Presence of coronary angioplasty implant and graft: Secondary | ICD-10-CM | POA: Insufficient documentation

## 2019-06-07 DIAGNOSIS — Z87828 Personal history of other (healed) physical injury and trauma: Secondary | ICD-10-CM | POA: Diagnosis not present

## 2019-06-07 DIAGNOSIS — Z20828 Contact with and (suspected) exposure to other viral communicable diseases: Secondary | ICD-10-CM | POA: Diagnosis not present

## 2019-06-07 DIAGNOSIS — L97519 Non-pressure chronic ulcer of other part of right foot with unspecified severity: Secondary | ICD-10-CM | POA: Diagnosis not present

## 2019-06-07 HISTORY — PX: ABDOMINAL AORTOGRAM W/LOWER EXTREMITY: CATH118223

## 2019-06-07 LAB — POCT I-STAT, CHEM 8
BUN: 20 mg/dL (ref 8–23)
Calcium, Ion: 1.16 mmol/L (ref 1.15–1.40)
Chloride: 104 mmol/L (ref 98–111)
Creatinine, Ser: 1 mg/dL (ref 0.61–1.24)
Glucose, Bld: 152 mg/dL — ABNORMAL HIGH (ref 70–99)
HCT: 39 % (ref 39.0–52.0)
Hemoglobin: 13.3 g/dL (ref 13.0–17.0)
Potassium: 3.8 mmol/L (ref 3.5–5.1)
Sodium: 139 mmol/L (ref 135–145)
TCO2: 26 mmol/L (ref 22–32)

## 2019-06-07 LAB — GLUCOSE, CAPILLARY
Glucose-Capillary: 157 mg/dL — ABNORMAL HIGH (ref 70–99)
Glucose-Capillary: 179 mg/dL — ABNORMAL HIGH (ref 70–99)

## 2019-06-07 LAB — SARS CORONAVIRUS 2 BY RT PCR (HOSPITAL ORDER, PERFORMED IN ~~LOC~~ HOSPITAL LAB): SARS Coronavirus 2: NEGATIVE

## 2019-06-07 SURGERY — ABDOMINAL AORTOGRAM W/LOWER EXTREMITY
Anesthesia: LOCAL | Laterality: Bilateral

## 2019-06-07 MED ORDER — FAMOTIDINE IN NACL 20-0.9 MG/50ML-% IV SOLN
20.0000 mg | INTRAVENOUS | Status: AC
  Administered 2019-06-07: 09:00:00 20 mg via INTRAVENOUS

## 2019-06-07 MED ORDER — ONDANSETRON HCL 4 MG/2ML IJ SOLN: 4.0000 mg | Freq: Four times a day (QID) | INTRAMUSCULAR | Status: DC | PRN

## 2019-06-07 MED ORDER — METHYLPREDNISOLONE SODIUM SUCC 125 MG IJ SOLR
125.0000 mg | INTRAMUSCULAR | Status: AC
  Administered 2019-06-07: 125 mg via INTRAVENOUS

## 2019-06-07 MED ORDER — HYDRALAZINE HCL 20 MG/ML IJ SOLN: 5.0000 mg | INTRAMUSCULAR | Status: DC | PRN

## 2019-06-07 MED ORDER — IODIXANOL 320 MG/ML IV SOLN
INTRAVENOUS | Status: DC | PRN
  Administered 2019-06-07: 12:00:00 130 mL via INTRA_ARTERIAL

## 2019-06-07 MED ORDER — DIPHENHYDRAMINE HCL 50 MG/ML IJ SOLN
25.0000 mg | INTRAMUSCULAR | Status: AC
  Administered 2019-06-07: 25 mg via INTRAVENOUS

## 2019-06-07 MED ORDER — SODIUM CHLORIDE 0.9 % IV SOLN
INTRAVENOUS | Status: DC
  Administered 2019-06-07: 09:00:00 via INTRAVENOUS

## 2019-06-07 MED ORDER — SODIUM CHLORIDE 0.9 % IV SOLN: INTRAVENOUS | Status: AC

## 2019-06-07 MED ORDER — HEPARIN (PORCINE) IN NACL 1000-0.9 UT/500ML-% IV SOLN
INTRAVENOUS | Status: DC | PRN
  Administered 2019-06-07: 500 mL

## 2019-06-07 MED ORDER — ACETAMINOPHEN 325 MG PO TABS: 650.0000 mg | ORAL_TABLET | ORAL | Status: DC | PRN

## 2019-06-07 MED ORDER — METHYLPREDNISOLONE SODIUM SUCC 125 MG IJ SOLR
INTRAMUSCULAR | Status: AC
  Filled 2019-06-07: qty 2

## 2019-06-07 MED ORDER — LIDOCAINE HCL (PF) 1 % IJ SOLN
INTRAMUSCULAR | Status: AC
  Filled 2019-06-07: qty 30

## 2019-06-07 MED ORDER — DIPHENHYDRAMINE HCL 50 MG/ML IJ SOLN
INTRAMUSCULAR | Status: AC
  Filled 2019-06-07: qty 1

## 2019-06-07 MED ORDER — LIDOCAINE HCL (PF) 1 % IJ SOLN
INTRAMUSCULAR | Status: DC | PRN
  Administered 2019-06-07: 15 mL

## 2019-06-07 MED ORDER — HEPARIN (PORCINE) IN NACL 1000-0.9 UT/500ML-% IV SOLN
INTRAVENOUS | Status: AC
  Filled 2019-06-07: qty 1000

## 2019-06-07 MED ORDER — SODIUM CHLORIDE 0.9 % IV SOLN: 250.0000 mL | INTRAVENOUS | Status: DC | PRN

## 2019-06-07 MED ORDER — SODIUM CHLORIDE 0.9% FLUSH: 3.0000 mL | INTRAVENOUS | Status: DC | PRN

## 2019-06-07 MED ORDER — SODIUM CHLORIDE 0.9% FLUSH: 3.0000 mL | Freq: Two times a day (BID) | INTRAVENOUS | Status: DC

## 2019-06-07 MED ORDER — LABETALOL HCL 5 MG/ML IV SOLN: 10.0000 mg | INTRAVENOUS | Status: DC | PRN

## 2019-06-07 SURGICAL SUPPLY — 9 items
CATH ANGIO 5F PIGTAIL 65CM (CATHETERS) ×1 IMPLANT
KIT PV (KITS) ×2 IMPLANT
SHEATH PINNACLE 5F 10CM (SHEATH) ×1 IMPLANT
SHEATH PROBE COVER 6X72 (BAG) ×1 IMPLANT
SYR MEDRAD MARK 7 150ML (SYRINGE) ×2 IMPLANT
TRANSDUCER W/STOPCOCK (MISCELLANEOUS) ×2 IMPLANT
TRAY PV CATH (CUSTOM PROCEDURE TRAY) ×2 IMPLANT
WIRE BENTSON .035X145CM (WIRE) ×1 IMPLANT
WIRE HITORQ VERSACORE ST 145CM (WIRE) ×1 IMPLANT

## 2019-06-07 NOTE — Progress Notes (Signed)
Bilateral lower extremity vein mapping completed. Refer to "CV Proc" under chart review to view preliminary results.  06/07/2019 2:30 PM Maudry Mayhew, MHA, RVT, RDCS, RDMS

## 2019-06-07 NOTE — Op Note (Signed)
Procedure: Ultrasound left groin abdominal aortogram with bilateral lower extremity runoff  Preoperative diagnosis: Nonhealing wound right foot  Postoperative diagnosis: Same  Anesthesia: Local  Operative findings: #1 occlusion right superficial femoral-popliteal artery with two-vessel peroneal posterior tibial artery runoff to the right foot  2.  Inline flow left leg with several areas of 20 to 50% stenosis throughout the left superficial femoral artery three-vessel runoff left foot  Operative details: After team informed consent, the patient was taken the [facility] lab.  The patient's placed supine position on the angios table.  Both groins were prepped and draped in usual sterile fashion.  Local anesthesia was infiltrated over the left common femoral artery.  Ultrasound was used to identify the left common femoral artery and femoral bifurcation.  An introducer needle was used to cannulate the left common femoral artery and a 035 versa core wire threaded up the abdominal aorta under fluoroscopic guidance.  Next a 5 French sheath placed over the guidewire in the left common femoral artery.  This was thoroughly flushed with heparinized saline.  5 French pigtail catheter was then advanced over the guidewire in the abdominal aorta and abdominal aortogram obtained in AP projection.  Left and right renal arteries are patent.  The infrarenal abdominal aorta is patent.  The left and right common external and internal iliac arteries are all patent.  Next the pigtail catheter was pulled down just above the aortic bifurcation and bilateral oblique views of the pelvis were obtained to remove overlying orthopedic hardware which was obscuring the distal right external iliac and common femoral artery.  This showed the right common femoral artery is widely patent.  The profunda femoris is patent.  The SFA is occluded at its origin.  The left common femoral proximal SFA and profunda are all patent.  Next bilateral lower  extremity runoff views were obtained through the pigtail catheter.  In the right lower extremity, the right common femoral profunda femoris is patent.  The right superficial femoral artery is occluded.  The popliteal artery is occluded.  There is reconstitution of the origin of the peroneal and posterior tibial arteries via profunda collaterals.  The posterior tibial artery is the dominant runoff vessel to the right foot.  In the left lower extremity the left common femoral profunda femoris and superficial femoral arteries are patent.  There are multiple segments in the left superficial femoral artery ranging from 25 to 50% stenosis.  However there is inline flow to the left foot.  The left popliteal artery is patent.  There is three-vessel runoff to the left foot.  There is slightly delayed flow in the tibial vessels to the left foot.  At this point the pigtail catheter was removed over guidewire.  The 5 French sheath was left in place to pulled the holding area.  The patient tolerated procedure well and there were no complications.  Operative management: The patient will be scheduled for cardiac risk gratification in the near future as well as bilateral lower extremity vein mapping in preparation for a right femoral to posterior tibial artery bypass for limb salvage and his nonhealing wound on his right foot.  All the findings and procedure details were discussed with the patient's wife by phone today.  Ruta Hinds, MD [facility] Office: [phone] Pager: [phone]

## 2019-06-07 NOTE — Progress Notes (Signed)
Client found with b/p cuff off getting out of bed; states thought it was 8 o'clock and time to go home; client oriented x 3; client's wife called and states client gets confused every day for weeks

## 2019-06-07 NOTE — Progress Notes (Signed)
Discharge instructions reviewed with pt and his wife.

## 2019-06-07 NOTE — Progress Notes (Signed)
Client up and walked and tolerated well; left groin stable, no bleeding or hematoma 

## 2019-06-07 NOTE — Interval H&P Note (Signed)
History and Physical Interval Note:  06/07/2019 10:37 AM  Dennis [name]  has presented today for surgery, with the diagnosis of PVD.  The various methods of treatment have been discussed with the patient and family. After consideration of risks, benefits and other options for treatment, the patient has consented to  Procedure(s): ABDOMINAL AORTOGRAM W/LOWER EXTREMITY (Bilateral) as a surgical intervention.  The patient's history has been reviewed, patient examined, no change in status, stable for surgery.  I have reviewed the patient's chart and labs.  Questions were answered to the patient's satisfaction.     Ruta Hinds

## 2019-06-07 NOTE — Discharge Instructions (Signed)
NO GLUCOPHAGE/METFORMIN FOR 2 DAYS   Femoral Site Care This sheet gives you information about how to care for yourself after your procedure. Your health care provider may also give you more specific instructions. If you have problems or questions, contact your health care provider. What can I expect after the procedure? After the procedure, it is common to have:  Bruising that usually fades within 1-2 weeks.  Tenderness at the site. Follow these instructions at home: Wound care  Follow instructions from your health care provider about how to take care of your insertion site. Make sure you: ? Wash your hands with soap and water before you change your bandage (dressing). If soap and water are not available, use hand sanitizer. ? Change your dressing as told by your health care provider. ? Leave stitches (sutures), skin glue, or adhesive strips in place. These skin closures may need to stay in place for 2 weeks or longer. If adhesive strip edges start to loosen and curl up, you may trim the loose edges. Do not remove adhesive strips completely unless your health care provider tells you to do that.  Do not take baths, swim, or use a hot tub until your health care provider approves.  You may shower 24-48 hours after the procedure or as told by your health care provider. ? Gently wash the site with plain soap and water. ? Pat the area dry with a clean towel. ? Do not rub the site. This may cause bleeding.  Do not apply powder or lotion to the site. Keep the site clean and dry.  Check your femoral site every day for signs of infection. Check for: ? Redness, swelling, or pain. ? Fluid or blood. ? Warmth. ? Pus or a bad smell. Activity  For the first 2-3 days after your procedure, or as long as directed: ? Avoid climbing stairs as much as possible. ? Do not squat.  Do not lift anything that is heavier than 10 lb (4.5 kg), or the limit that you are told, until your health care provider  says that it is safe.  Rest as directed. ? Avoid sitting for a long time without moving. Get up to take short walks every 1-2 hours.  Do not drive for 24 hours if you were given a medicine to help you relax (sedative). General instructions  Take over-the-counter and prescription medicines only as told by your health care provider.  Keep all follow-up visits as told by your health care provider. This is important. Contact a health care provider if you have:  A fever or chills.  You have redness, swelling, or pain around your insertion site. Get help right away if:  The catheter insertion area swells very fast.  You pass out.  You suddenly start to sweat or your skin gets clammy.  The catheter insertion area is bleeding, and the bleeding does not stop when you hold steady pressure on the area.  The area near or just beyond the catheter insertion site becomes pale, cool, tingly, or numb. These symptoms may represent a serious problem that is an emergency. Do not wait to see if the symptoms will go away. Get medical help right away. Call your local emergency services (911 in the [place] Do not drive yourself to the hospital. Summary  After the procedure, it is common to have bruising that usually fades within 1-2 weeks.  Check your femoral site every day for signs of infection.  Do not lift anything that is heavier  than 10 lb (4.5 kg), or the limit that you are told, until your health care provider says that it is safe. This information is not intended to replace advice given to you by your health care provider. Make sure you discuss any questions you have with your health care provider. Document Released: 05/09/2014 Document Revised: 09/18/2017 Document Reviewed: 09/18/2017 Elsevier Patient Education  [place]. Moderate Conscious Sedation, Adult, Care After These instructions provide you with information about caring for yourself after your procedure. Your health care  provider may also give you more specific instructions. Your treatment has been planned according to current medical practices, but problems sometimes occur. Call your health care provider if you have any problems or questions after your procedure. What can I expect after the procedure? After your procedure, it is common:  To feel sleepy for several hours.  To feel clumsy and have poor balance for several hours.  To have poor judgment for several hours.  To vomit if you eat too soon. Follow these instructions at home: For at least 24 hours after the procedure:   Do not: ? Participate in activities where you could fall or become injured. ? Drive. ? Use heavy machinery. ? Drink alcohol. ? Take sleeping pills or medicines that cause drowsiness. ? Make important decisions or sign legal documents. ? Take care of children on your own.  Rest. Eating and drinking  Follow the diet recommended by your health care provider.  If you vomit: ? Drink water, juice, or soup when you can drink without vomiting. ? Make sure you have little or no nausea before eating solid foods. General instructions  Have a responsible adult stay with you until you are awake and alert.  Take over-the-counter and prescription medicines only as told by your health care provider.  If you smoke, do not smoke without supervision.  Keep all follow-up visits as told by your health care provider. This is important. Contact a health care provider if:  You keep feeling nauseous or you keep vomiting.  You feel light-headed.  You develop a rash.  You have a fever. Get help right away if:  You have trouble breathing. This information is not intended to replace advice given to you by your health care provider. Make sure you discuss any questions you have with your health care provider. Document Released: 06/26/2013 Document Revised: 08/18/2017 Document Reviewed: 12/26/2015 Elsevier Patient Education  2020  [employer].

## 2019-06-10 ENCOUNTER — Encounter (HOSPITAL_COMMUNITY): Payer: Self-pay | Admitting: Vascular Surgery

## 2019-06-12 ENCOUNTER — Ambulatory Visit: Payer: Medicare HMO | Admitting: Student

## 2019-06-12 ENCOUNTER — Encounter: Payer: Self-pay | Admitting: Student

## 2019-06-12 ENCOUNTER — Other Ambulatory Visit: Payer: Self-pay

## 2019-06-12 ENCOUNTER — Encounter: Payer: Self-pay | Admitting: *Deleted

## 2019-06-12 VITALS — BP 113/58 | HR 72 | Temp 97.1°F | Ht 72.0 in | Wt 209.0 lb

## 2019-06-12 DIAGNOSIS — I2510 Atherosclerotic heart disease of native coronary artery without angina pectoris: Secondary | ICD-10-CM

## 2019-06-12 DIAGNOSIS — I739 Peripheral vascular disease, unspecified: Secondary | ICD-10-CM

## 2019-06-12 DIAGNOSIS — Z01818 Encounter for other preprocedural examination: Secondary | ICD-10-CM | POA: Diagnosis not present

## 2019-06-12 DIAGNOSIS — I4891 Unspecified atrial fibrillation: Secondary | ICD-10-CM

## 2019-06-12 DIAGNOSIS — I9789 Other postprocedural complications and disorders of the circulatory system, not elsewhere classified: Secondary | ICD-10-CM

## 2019-06-12 DIAGNOSIS — I10 Essential (primary) hypertension: Secondary | ICD-10-CM | POA: Diagnosis not present

## 2019-06-12 DIAGNOSIS — E785 Hyperlipidemia, unspecified: Secondary | ICD-10-CM

## 2019-06-12 NOTE — Patient Instructions (Signed)
Medication Instructions:  Continue all current medications.  Labwork: none  Testing/Procedures:  Your physician has requested that you have a lexiscan myoview. For further information please visit [email or web address]. Please follow instruction sheet, as given.  Office will contact with results via phone or letter.    Follow-Up: Your physician wants you to follow up in: 6 months.  You will receive a reminder letter in the mail one-two months in advance.  If you don't receive a letter, please call our office to schedule the follow up appointment   Any Other Special Instructions Will Be Listed Below (If Applicable).  If you need a refill on your cardiac medications before your next appointment, please call your pharmacy.  

## 2019-06-12 NOTE — Progress Notes (Signed)
Cardiology Office Note    Date:  06/12/2019   ID:  Dennis [name], DOB 25-May-1946, MRN [id number]  PCP:  Dennis Chroman, MD  Cardiologist: Dennis Dolly, MD    Chief Complaint  Patient presents with  . Pre-op Exam    History of Present Illness:    Dennis [name] is a 73 y.o. male with past medical history of CAD (s/p prior stenting of RCA and OM2 in 2006, s/p STEMI in 10/2017 with stenting of mid-RCA and PDA with DES x2), post-operative atrial fibrillation (occurring in 11/2017 following hip surgery), HTN, HLD, and Type 2 DM who presents to the office today for cardiac clearance.  He most recently had a telehealth visit with Dr. Harl [name] in 01/2019 and reported positional chest discomfort but denied any exertional symptoms. Was continued on his current medication regimen at that time with recommendations to continue DAPT indefinitely. He did report episodes of leg discomfort concerning for claudication and ABI's were recommended. He was referred to Vascular Surgery by Podiatry in the interim due to nonhealing wounds along his right foot. He underwent lower extremity angiography on 05/22/2019 which showed occlusion of the right superficial femoral-popliteal artery with two-vessel peroneal posterior tibial artery runoff to the right foot. Cardiac clearance was recommended for possible right femoral to posterior tibial artery bypass for limb salvage.   In talking with the patient today, he was initially frustrated as he thought he was meeting with Dr. Oneida [name] to discuss his possible upcoming surgery. I reviewed the recommendations for cardiac clearance prior to undergoing this. He is not overly active at baseline secondary to his leg pain and says that the most active thing he does is walk from room to room in his home. Does not climb any stairs. Does not go to the grocery store or walk to the mailbox. He experiences baseline dyspnea on exertion but denies any associated chest pain. He does  experience occasional palpitations, typically occurring at nighttime.  Denies any tachypalpitations or associated dizziness or presyncope. No recent orthopnea, PND, or lower extremity edema. He does not check his blood pressure regularly at home but it is well controlled at 113/58 during today's visit.  The patient was initially unsure if he was taking both ASA and Plavix but his wife who was present clarified this and he has been compliant with Plavix.    Past Medical History:  Diagnosis Date  . Arthritis   . CAD (coronary artery disease)    a. s/p prior stenting of RCA and OM2 in 2006 b. s/p STEMI in 10/2017 with stenting of mid-RCA and PDA with DES x2  . Chronic pain   . Diabetes mellitus ([place])   . Hyperlipidemia   . Hypertension   . STEMI (ST elevation myocardial infarction) ([place])   . Tobacco use     Past Surgical History:  Procedure Laterality Date  . ABDOMINAL AORTOGRAM W/LOWER EXTREMITY Bilateral 06/07/2019   Procedure: ABDOMINAL AORTOGRAM W/LOWER EXTREMITY;  Surgeon: Dennis Dutch, MD;  Location: [facility];  Service: Cardiovascular;  Laterality: Bilateral;  . CORONARY STENT INTERVENTION N/A 11/13/2017   Procedure: CORONARY STENT INTERVENTION;  Surgeon: Dennis, Peter M, MD;  Location: [facility];  Service: Cardiovascular;  Laterality: N/A;  . CORONARY/GRAFT ACUTE MI REVASCULARIZATION N/A 11/13/2017   Procedure: Coronary/Graft Acute MI Revascularization;  Surgeon: Dennis, Peter M, MD;  Location: [facility];  Service: Cardiovascular;  Laterality: N/A;  . LEFT HEART CATH AND CORONARY ANGIOGRAPHY N/A 11/13/2017  Procedure: LEFT HEART CATH AND CORONARY ANGIOGRAPHY;  Surgeon: Dennis, Peter M, MD;  Location: [facility];  Service: Cardiovascular;  Laterality: N/A;  . TOTAL HIP ARTHROPLASTY Right 12/18/2017   Procedure: TOTAL HIP ARTHROPLASTY ANTERIOR APPROACH;  Surgeon: Dennis Can, MD;  Location: [facility];  Service: Orthopedics;  Laterality: Right;     Current Medications: Outpatient Medications Prior to Visit  Medication Sig Dispense Refill  . acetaminophen (TYLENOL) 500 MG tablet Take 1,000 mg by mouth every 6 (six) hours as needed for mild pain or headache.    Marland Kitchen aspirin EC 81 MG tablet Take 81 mg by mouth daily.    . clopidogrel (PLAVIX) 75 MG tablet Take 75 mg by mouth daily.    . diazepam (VALIUM) 10 MG tablet Take 1 tablet by mouth at bedtime as needed for sleep.     . furosemide (LASIX) 20 MG tablet Take 20 mg by mouth daily as needed for fluid (swelling).     Marland Kitchen HYDROcodone-acetaminophen (NORCO/VICODIN) 5-325 MG tablet Take 1 tablet by mouth every 6 (six) hours as needed for moderate pain or severe pain. 15 tablet 0  . linaclotide (LINZESS) 290 MCG CAPS capsule Take 290 mcg by mouth daily as needed (constipation).     Marland Kitchen lisinopril (PRINIVIL,ZESTRIL) 5 MG tablet Take 5 mg by mouth daily.    . Menthol, Topical Analgesic, (ICY HOT BACK EX) Apply 1 application topically every 6 (six) hours as needed (pain).    . metFORMIN (GLUCOPHAGE) 850 MG tablet Take 850 mg by mouth daily with breakfast.    . metoprolol tartrate (LOPRESSOR) 25 MG tablet Take 1 tablet by mouth twice a day (Patient taking differently: Take 25 mg by mouth 2 (two) times daily. ) 180 tablet 2  . nitroGLYCERIN (NITROSTAT) 0.4 MG SL tablet Place 1 tablet (0.4 mg total) under the tongue every 5 (five) minutes x 3 doses as needed for chest pain. 25 tablet 2  . rosuvastatin (CRESTOR) 20 MG tablet Take 1 tablet (20 mg total) by mouth daily. 30 tablet 12  . silver sulfADIAZINE (SILVADENE) 1 % cream Apply 1 application topically daily.    . TRESIBA FLEXTOUCH 200 UNIT/ML SOPN Inject 28 Units into the skin 2 (two) times daily.     . vitamin B-12 1000 MCG tablet Take 1 tablet (1,000 mcg total) by mouth daily.     No facility-administered medications prior to visit.      Allergies:   Betadine [povidone iodine], Fish allergy, Iodine, Shellfish allergy, Sulfamethoxazole-trimethoprim,  Gabapentin, Trimethoprim, and Tramadol   Social History   Socioeconomic History  . Marital status: Married    Spouse name: Not on file  . Number of children: Not on file  . Years of education: Not on file  . Highest education level: Not on file  Occupational History  . Not on file  Social Needs  . Financial resource strain: Not on file  . Food insecurity    Worry: Not on file    Inability: Not on file  . Transportation needs    Medical: Not on file    Non-medical: Not on file  Tobacco Use  . Smoking status: Former Smoker    Types: Cigarettes    Start date: 10/25/2017    Quit date: 11/15/2017    Years since quitting: 1.5  . Smokeless tobacco: Never Used  Substance and Sexual Activity  . Alcohol use: No    Frequency: Never  . Drug use: No  . Sexual activity: Not on  file  Lifestyle  . Physical activity    Days per week: Not on file    Minutes per session: Not on file  . Stress: Not on file  Relationships  . Social Herbalist on phone: Not on file    Gets together: Not on file    Attends religious service: Not on file    Active member of club or organization: Not on file    Attends meetings of clubs or organizations: Not on file    Relationship status: Not on file  Other Topics Concern  . Not on file  Social History Narrative  . Not on file     Family History:  The patient's family history includes Heart disease in his father; Hypertension in his father.   Review of Systems:   Please see the history of present illness.     General:  No chills, fever, night sweats or weight changes.  Cardiovascular:  No chest pain,edema, orthopnea, palpitations, paroxysmal nocturnal dyspnea. Positive for dyspnea on exertion and claudication.  Dermatological: No rash, lesions/masses Respiratory: No cough, dyspnea Urologic: No hematuria, dysuria Abdominal:   No nausea, vomiting, diarrhea, bright red blood per rectum, melena, or hematemesis Neurologic:  No visual changes,  wkns, changes in mental status. All other systems reviewed and are otherwise negative except as noted above.   Physical Exam:    VS:  BP (!) 113/58   Pulse 72   Temp (!) 97.1 F (36.2 C)   Ht 6' (1.829 [name])   Wt 209 lb (94.8 kg)   BMI 28.35 kg/[name]    General: Well developed, well nourished,male appearing in no acute distress. Head: Normocephalic, atraumatic, sclera non-icteric, no xanthomas, nares are without discharge.  Neck: No carotid bruits. JVD not elevated.  Lungs: Respirations regular and unlabored, without wheezes or rales.  Heart: Regular rate and rhythm with occasional ectopic beats. No S3 or S4.  No murmur, no rubs, or gallops appreciated. Abdomen: Soft, non-tender, non-distended with normoactive bowel sounds. No hepatomegaly. No rebound/guarding. No obvious abdominal masses. Msk:  Strength and tone appear normal for age. No joint deformities or effusions. Extremities: No clubbing or cyanosis. No edema.  Distal pedal pulses are 1+ bilaterally. Neuro: Alert and oriented X 3. Moves all extremities spontaneously. No focal deficits noted. Psych:  Responds to questions appropriately with a normal affect. Skin: No rashes or lesions noted  Wt Readings from Last 3 Encounters:  06/12/19 209 lb (94.8 kg)  06/07/19 230 lb (104.3 kg)  05/22/19 211 lb 14.4 oz (96.1 kg)     Studies/Labs Reviewed:   EKG:  EKG is ordered today.  The ekg ordered today demonstrates NSR with 1st degree AV Block, HR 81. Frequent PVC's in a couplet pattern. TWI along Leads III and AVF which is similar to prior tracings but more prominent.   Recent Labs: 06/07/2019: BUN 20; Creatinine, Ser 1.00; Hemoglobin 13.3; Potassium 3.8; Sodium 139   Lipid Panel    Component Value Date/Time   CHOL 117 11/14/2017 0500   TRIG 72 11/14/2017 0500   HDL 33 (L) 11/14/2017 0500   CHOLHDL 3.5 11/14/2017 0500   VLDL 14 11/14/2017 0500   LDLCALC 70 11/14/2017 0500    Additional studies/ records that were reviewed today  include:   Cardiac Catheterization: 10/2017  Mid RCA lesion is 100% stenosed.  Dist RCA lesion is 40% stenosed.  Prox RCA lesion is 50% stenosed.  Ost RPDA to RPDA lesion is 99% stenosed.  Prox LAD  to Mid LAD lesion is 60% stenosed.  Ost 1st Diag lesion is 90% stenosed.  Ost 2nd Diag to 2nd Diag lesion is 85% stenosed.  Ost 2nd Mrg to 2nd Mrg lesion is 40% stenosed.  Ost Cx to Prox Cx lesion is 40% stenosed.  Ost 1st Mrg to 1st Mrg lesion is 70% stenosed.  The left ventricular systolic function is normal.  LV end diastolic pressure is mildly elevated.  The left ventricular ejection fraction is 55-65% by visual estimate.  Post intervention, there is a 0% residual stenosis.  A drug-eluting stent was successfully placed using a STENT SYNERGY DES 2.25X28.  Post intervention, there is a 0% residual stenosis.  A drug-eluting stent was successfully placed using a STENT SYNERGY DES 3X20.   1. 3 vessel obstructive CAD.    - diffuse 60% proximal to mid LAD with severe disease in small diagonal branches.    - 70% OM1. Stent in OM 2 is diffusely diseased but patent    - 100% mid RCA and 99% PDA 2. Good LV function 3. Mildly elevated LVEDP 4. Successful stenting of the PDA and mid RCA with DES x 2. Diffusely diseased and heavily calcified RCA.  Plan: DAPT indefinitely. Aggressive risk factor modification and smoking cessation.   Echocardiogram: 01/2018 Study Conclusions  - Left ventricle: The cavity size was normal. Systolic function was   normal. The estimated ejection fraction was in the range of 55%   to 60%. Possible mild hypokinesis of the basal-midinferior   myocardium. - Left atrium: The atrium was mildly dilated.   Lower Extremity Angiography: 05/2019 Operative findings: #1 occlusion right superficial femoral-popliteal artery with two-vessel peroneal posterior tibial artery runoff to the right foot  2.  Inline flow left leg with several areas of 20 to 50%  stenosis throughout the left superficial femoral artery three-vessel runoff left foot  Operative details: After team informed consent, the patient was taken the [facility] lab.  The patient's placed supine position on the angios table.  Both groins were prepped and draped in usual sterile fashion.  Local anesthesia was infiltrated over the left common femoral artery.  Ultrasound was used to identify the left common femoral artery and femoral bifurcation.  An introducer needle was used to cannulate the left common femoral artery and a 035 versa core wire threaded up the abdominal aorta under fluoroscopic guidance.  Next a 5 French sheath placed over the guidewire in the left common femoral artery.  This was thoroughly flushed with heparinized saline.  5 French pigtail catheter was then advanced over the guidewire in the abdominal aorta and abdominal aortogram obtained in AP projection.  Left and right renal arteries are patent.  The infrarenal abdominal aorta is patent.  The left and right common external and internal iliac arteries are all patent.  Next the pigtail catheter was pulled down just above the aortic bifurcation and bilateral oblique views of the pelvis were obtained to remove overlying orthopedic hardware which was obscuring the distal right external iliac and common femoral artery.  This showed the right common femoral artery is widely patent.  The profunda femoris is patent.  The SFA is occluded at its origin.  The left common femoral proximal SFA and profunda are all patent.  Next bilateral lower extremity runoff views were obtained through the pigtail catheter.  In the right lower extremity, the right common femoral profunda femoris is patent.  The right superficial femoral artery is occluded.  The popliteal artery is occluded.  There  is reconstitution of the origin of the peroneal and posterior tibial arteries via profunda collaterals.  The posterior tibial artery is the dominant runoff  vessel to the right foot.  In the left lower extremity the left common femoral profunda femoris and superficial femoral arteries are patent.  There are multiple segments in the left superficial femoral artery ranging from 25 to 50% stenosis.  However there is inline flow to the left foot.  The left popliteal artery is patent.  There is three-vessel runoff to the left foot.  There is slightly delayed flow in the tibial vessels to the left foot.  At this point the pigtail catheter was removed over guidewire.  The 5 French sheath was left in place to pulled the holding area.  The patient tolerated procedure well and there were no complications.  Operative management: The patient will be scheduled for cardiac risk gratification in the near future as well as bilateral lower extremity vein mapping in preparation for a right femoral to posterior tibial artery bypass for limb salvage and his nonhealing wound on his right foot.  All the findings and procedure details were discussed with the patient's wife by phone today.  Assessment:    1. Pre-operative clearance   2. Coronary artery disease involving native coronary artery of native heart without angina pectoris   3. Postoperative atrial fibrillation (HCC)   4. Essential hypertension   5. Hyperlipidemia LDL goal <70   6. PAD (peripheral artery disease) ([place])      Plan:   In order of problems listed above:  1. Preoperative Cardiac Clearance for Right Femoral to Posterior Tibial Artery Bypass  - He has been experiencing worsening claudication and also had a nonhealing wound along his right foot with angiography showing significant PAD as outlined above. - He is not active at baseline and is unable to perform 4 METS of activity given his back pain and leg pain. Given this along with his known cardiac history and abnormal EKG, will plan for a Lexiscan Myoview for ischemic evaluation prior to his upcoming surgery. This was reviewed with the patient  and his wife today in detail.   2. CAD - s/p prior stenting of RCA and OM2 in 2006 and STEMI in 10/2017 with stenting of mid-RCA and PDA with DES x2. - He has baseline dyspnea on exertion and denies any recent chest pain. Given his upcoming surgery and minimal functional status, will plan for a repeat Lexiscan Myoview as outlined above. - Continue current medication regimen with ASA, Plavix, statin, and beta-blocker therapy. Plavix could likely be held 5 days prior to his upcoming surgery given no recent intervention. Will forward to Dr. Harl [name] for final clarification.   3. Post-operative Atrial Fibrillation - Occurred following hip surgery in 11/2017. Would follow closely on telemetry with his upcoming surgery as he is at high-risk for recurrence. No longer on anticoagulation.  4. HTN - BP is well controlled at 113/58 during today's visit. Continue current medication regimen with Lisinopril 5 mg daily and Lopressor 25 mg twice daily.  5. HLD - Followed by PCP.  No recent labs available in [place]. Goal LDL is less than 70 in the setting of known CAD and PAD. Continue Crestor 20 mg daily.  6. PAD - recent lower extremity angiography on 05/22/2019 showed occlusion of the right superficial femoral-popliteal artery with two-vessel peroneal posterior tibial artery runoff to the right foot. Planning for surgical intervention as outlined above. Followed by Vascular Surgery.  - continue ASA,  Plavix, and statin therapy.   Medication Adjustments/Labs and Tests Ordered: Current medicines are reviewed at length with the patient today.  Concerns regarding medicines are outlined above.  Medication changes, Labs and Tests ordered today are listed in the Patient Instructions below. Patient Instructions  Medication Instructions:  Continue all current medications.  Labwork: none  Testing/Procedures:  Your physician has requested that you have a lexiscan myoview. For further information please visit  [email or web address]. Please follow instruction sheet, as given.  Office will contact with results via phone or letter.    Follow-Up: Your physician wants you to follow up in: 6 months.  You will receive a reminder letter in the mail one-two months in advance.  If you don't receive a letter, please call our office to schedule the follow up appointment   Any Other Special Instructions Will Be Listed Below (If Applicable).  If you need a refill on your cardiac medications before your next appointment, please call your pharmacy.     Signed, Erma Heritage, PA-C  06/12/2019 5:42 PM    [facility] [address] [place],  [address] Phone: [phone] Fax: [phone]

## 2019-06-17 ENCOUNTER — Encounter (HOSPITAL_COMMUNITY): Payer: Self-pay

## 2019-06-17 ENCOUNTER — Ambulatory Visit (HOSPITAL_COMMUNITY)
Admission: RE | Admit: 2019-06-17 | Discharge: 2019-06-17 | Disposition: A | Discharge status: Home / Self Care | Payer: Medicare HMO | Source: Ambulatory Visit | Attending: Student | Admitting: Student

## 2019-06-17 ENCOUNTER — Other Ambulatory Visit: Payer: Self-pay

## 2019-06-17 ENCOUNTER — Encounter (HOSPITAL_COMMUNITY)
Admission: RE | Admit: 2019-06-17 | Discharge: 2019-06-17 | Disposition: A | Discharge status: Home / Self Care | Payer: Medicare HMO | Source: Ambulatory Visit | Attending: Student | Admitting: Student

## 2019-06-17 DIAGNOSIS — Z79899 Other long term (current) drug therapy: Secondary | ICD-10-CM | POA: Insufficient documentation

## 2019-06-17 DIAGNOSIS — I5032 Chronic diastolic (congestive) heart failure: Secondary | ICD-10-CM | POA: Diagnosis not present

## 2019-06-17 DIAGNOSIS — Z01818 Encounter for other preprocedural examination: Secondary | ICD-10-CM | POA: Insufficient documentation

## 2019-06-17 DIAGNOSIS — I252 Old myocardial infarction: Secondary | ICD-10-CM | POA: Insufficient documentation

## 2019-06-17 DIAGNOSIS — I110 Hypertensive heart disease with heart failure: Secondary | ICD-10-CM | POA: Insufficient documentation

## 2019-06-17 DIAGNOSIS — Z87891 Personal history of nicotine dependence: Secondary | ICD-10-CM | POA: Insufficient documentation

## 2019-06-17 DIAGNOSIS — I2510 Atherosclerotic heart disease of native coronary artery without angina pectoris: Secondary | ICD-10-CM | POA: Insufficient documentation

## 2019-06-17 LAB — NM MYOCAR MULTI W/SPECT W/WALL MOTION / EF
LV dias vol: 86 mL (ref 62–150)
LV sys vol: 38 mL
Peak HR: 116 {beats}/min
RATE: 0.42
Rest HR: 68 {beats}/min
SDS: 5
SRS: 10
SSS: 15
TID: 1.15

## 2019-06-17 MED ORDER — SODIUM CHLORIDE 0.9% FLUSH
INTRAVENOUS | Status: AC
  Administered 2019-06-17: 10 mL via INTRAVENOUS
  Filled 2019-06-17: qty 10

## 2019-06-17 MED ORDER — TECHNETIUM TC 99M TETROFOSMIN IV KIT
10.0000 | PACK | Freq: Once | INTRAVENOUS | Status: AC | PRN
  Administered 2019-06-17: 10:00:00 9.9 via INTRAVENOUS

## 2019-06-17 MED ORDER — TECHNETIUM TC 99M TETROFOSMIN IV KIT
30.0000 | PACK | Freq: Once | INTRAVENOUS | Status: AC | PRN
  Administered 2019-06-17: 11:00:00 30.3 via INTRAVENOUS

## 2019-06-17 MED ORDER — REGADENOSON 0.4 MG/5ML IV SOLN
INTRAVENOUS | Status: AC
  Administered 2019-06-17: 0.4 mg via INTRAVENOUS
  Filled 2019-06-17: qty 5

## 2019-06-18 ENCOUNTER — Other Ambulatory Visit: Payer: Self-pay | Admitting: *Deleted

## 2019-06-18 ENCOUNTER — Telehealth: Payer: Self-pay | Admitting: *Deleted

## 2019-06-18 NOTE — Telephone Encounter (Signed)
Phone call to patient and wife. Instructed to be at [facility] admitting at 5:30 am on 07/01/2019 for surgery with Dr. Oneida Alar. NPO past MN night prior. Hold Plavix x 5 days prior to surgery. Stop after 06/25/2019 dose. Expect a call and follow the detailed surgery instructions received from the hospital preadmission department as well as appointment for pre-op nasal swab testing for Covid -19. Instructions read back and verbalized understanding.

## 2019-06-26 NOTE — Pre-Procedure Instructions (Addendum)
[place], [place] [address] Phone: [phone] Fax: [phone]      Your procedure is scheduled on  07-01-19 Monday  Report to [facility] Main Entrance "A" at 0530 A.M., and check in at the Admitting office.  Call this number if you have problems the morning of surgery:  [phone]  Call [phone] if you have any questions prior to your surgery date Monday-Friday 8am-4pm    Remember:  Do not eat or drink after midnight the night before your surgery  Take these medicines the morning of surgery with A SIP OF WATER : rosuvastatin (CRESTOR) metoprolol tartrate (LOPRESSOR)  acetaminophen (TYLENOL) as needed nitroGLYCERIN (NITROSTAT) as needed HYDROcodone-acetaminophen (NORCO/VICODIN)as needed Follow your surgeon's instructions on when to stop Aspirin and  PLAVIX.  If no instructions were given by your surgeon then you will need to call the office to get those instructions.    7 days prior to surgery STOP taking any Aspirin (unless otherwise instructed by your surgeon), Aleve, Naproxen, Ibuprofen, Motrin, Advil, Goody's, BC's, all herbal medications, fish oil, and all vitamins.   WHAT DO I DO ABOUT MY DIABETES MEDICATION?   Marland Kitchen Do not take oral diabetes medicines (pills)including METFORMIN  the morning of surgery.  . THE DAY BEFORE SURGERY, Take 28 units of [place] in the morning and 14U in the evening.    THE MORNING OF SURGERY, take 14U of [place].   How to Manage Your Diabetes Before and After Surgery  Why is it important to control my blood sugar before and after surgery? . Improving blood sugar levels before and after surgery helps healing and can limit problems. . A way of improving blood sugar control is eating a healthy diet by: o  Eating less sugar and carbohydrates o  Increasing activity/exercise o  Talking with your doctor about reaching your blood sugar goals . High blood sugars  (greater than 180 mg/dL) can raise your risk of infections and slow your recovery, so you will need to focus on controlling your diabetes during the weeks before surgery. . Make sure that the doctor who takes care of your diabetes knows about your planned surgery including the date and location.  How do I manage my blood sugar before surgery? . Check your blood sugar at least 4 times a day, starting 2 days before surgery, to make sure that the level is not too high or low. o Check your blood sugar the morning of your surgery when you wake up and every 2 hours until you get to the Short Stay unit. . If your blood sugar is less than 70 mg/dL, you will need to treat for low blood sugar: o Do not take insulin. o Treat a low blood sugar (less than 70 mg/dL) with  cup of clear juice (cranberry or apple), 4 glucose tablets, OR glucose gel. o Recheck blood sugar in 15 minutes after treatment (to make sure it is greater than 70 mg/dL). If your blood sugar is not greater than 70 mg/dL on recheck, call [phone] for further instructions. . Report your blood sugar to the short stay nurse when you get to Short Stay.  . If you are admitted to the hospital after surgery: o Your blood sugar will be checked by the staff and you will probably be given insulin after surgery (instead of oral diabetes medicines) to make sure you have good blood sugar levels. o The goal for blood sugar control  after surgery is 80-180 mg/dL.    The Morning of Surgery  Do not wear jewelry.  Do not wear lotions, powders, or perfumes/colognes, or deodorant    Men may shave face and neck.  Do not bring valuables to the hospital.  [facility] is not responsible for any belongings or valuables.  If you are a smoker, DO NOT Smoke 24 hours prior to surgery IF you wear a CPAP at night please bring your mask, tubing, and machine the morning of surgery   Remember that you must have someone to transport you home after your surgery, and  remain with you for 24 hours if you are discharged the same day.   Contacts, glasses, hearing aids, dentures or bridgework may not be worn into surgery.    Leave your suitcase in the car.  After surgery it may be brought to your room.  For patients admitted to the hospital, discharge time will be determined by your treatment team.  Patients discharged the day of surgery will not be allowed to drive home.    Special instructions:   [place]- Preparing For Surgery  Before surgery, you can play an important role. Because skin is not sterile, your skin needs to be as free of germs as possible. You can reduce the number of germs on your skin by washing with CHG (chlorahexidine gluconate) Soap before surgery.  CHG is an antiseptic cleaner which kills germs and bonds with the skin to continue killing germs even after washing.    Oral Hygiene is also important to reduce your risk of infection.  Remember - BRUSH YOUR TEETH THE MORNING OF SURGERY WITH YOUR REGULAR TOOTHPASTE  Please do not use if you have an allergy to CHG or antibacterial soaps. If your skin becomes reddened/irritated stop using the CHG.  Do not shave (including legs and underarms) for at least 48 hours prior to first CHG shower. It is OK to shave your face.  Please follow these instructions carefully.   1. Shower the NIGHT BEFORE SURGERY and the MORNING OF SURGERY with CHG Soap.   2. If you chose to wash your hair, wash your hair first as usual with your normal shampoo.  3. After you shampoo, rinse your hair and body thoroughly to remove the shampoo.  4. Use CHG as you would any other liquid soap. You can apply CHG directly to the skin and wash gently with a scrungie or a clean washcloth.   5. Apply the CHG Soap to your body ONLY FROM THE NECK DOWN.  Do not use on open wounds or open sores. Avoid contact with your eyes, ears, mouth and genitals (private parts). Wash Face and genitals (private parts)  with your normal  soap.   6. Wash thoroughly, paying special attention to the area where your surgery will be performed.  7. Thoroughly rinse your body with warm water from the neck down.  8. DO NOT shower/wash with your normal soap after using and rinsing off the CHG Soap.  9. Pat yourself dry with a CLEAN TOWEL.  10. Wear CLEAN PAJAMAS to bed the night before surgery, wear comfortable clothes the morning of surgery  11. Place CLEAN SHEETS on your bed the night of your first shower and DO NOT SLEEP WITH PETS.    Day of Surgery:  Do not apply any deodorants/lotions. Please shower the morning of surgery with the CHG soap  Please wear clean clothes to the hospital/surgery center.   Remember to brush your  teeth WITH YOUR REGULAR TOOTHPASTE.   Please read over the  fact sheets that you were given.

## 2019-06-27 ENCOUNTER — Encounter (HOSPITAL_COMMUNITY): Payer: Self-pay

## 2019-06-27 ENCOUNTER — Other Ambulatory Visit (HOSPITAL_COMMUNITY)
Admission: RE | Admit: 2019-06-27 | Discharge: 2019-06-27 | Disposition: A | Discharge status: Home / Self Care | Payer: Medicare HMO | Source: Ambulatory Visit | Attending: Vascular Surgery | Admitting: Vascular Surgery

## 2019-06-27 ENCOUNTER — Other Ambulatory Visit: Payer: Self-pay

## 2019-06-27 ENCOUNTER — Encounter (HOSPITAL_COMMUNITY)
Admission: RE | Admit: 2019-06-27 | Discharge: 2019-06-27 | Disposition: A | Discharge status: Home / Self Care | Payer: Medicare HMO | Source: Ambulatory Visit | Attending: Vascular Surgery | Admitting: Vascular Surgery

## 2019-06-27 DIAGNOSIS — I252 Old myocardial infarction: Secondary | ICD-10-CM | POA: Diagnosis not present

## 2019-06-27 DIAGNOSIS — Z20828 Contact with and (suspected) exposure to other viral communicable diseases: Secondary | ICD-10-CM | POA: Insufficient documentation

## 2019-06-27 DIAGNOSIS — Z01812 Encounter for preprocedural laboratory examination: Secondary | ICD-10-CM | POA: Insufficient documentation

## 2019-06-27 DIAGNOSIS — I739 Peripheral vascular disease, unspecified: Secondary | ICD-10-CM | POA: Diagnosis not present

## 2019-06-27 DIAGNOSIS — I2510 Atherosclerotic heart disease of native coronary artery without angina pectoris: Secondary | ICD-10-CM | POA: Insufficient documentation

## 2019-06-27 LAB — COMPREHENSIVE METABOLIC PANEL
ALT: 12 U/L (ref 0–44)
AST: 15 U/L (ref 15–41)
Albumin: 3.5 g/dL (ref 3.5–5.0)
Alkaline Phosphatase: 91 U/L (ref 38–126)
Anion gap: 11 (ref 5–15)
BUN: 17 mg/dL (ref 8–23)
CO2: 25 mmol/L (ref 22–32)
Calcium: 9.1 mg/dL (ref 8.9–10.3)
Chloride: 104 mmol/L (ref 98–111)
Creatinine, Ser: 1.35 mg/dL — ABNORMAL HIGH (ref 0.61–1.24)
GFR calc Af Amer: >60 mL/min (ref >60)
GFR calc non Af Amer: 52 mL/min — ABNORMAL LOW (ref >60)
Glucose, Bld: 151 mg/dL — ABNORMAL HIGH (ref 70–99)
Potassium: 3.9 mmol/L (ref 3.5–5.1)
Sodium: 140 mmol/L (ref 135–145)
Total Bilirubin: 0.6 mg/dL (ref 0.3–1.2)
Total Protein: 7.4 g/dL (ref 6.5–8.1)

## 2019-06-27 LAB — GLUCOSE, CAPILLARY: Glucose-Capillary: 176 mg/dL — ABNORMAL HIGH (ref 70–99)

## 2019-06-27 LAB — HEMOGLOBIN A1C
Hgb A1c MFr Bld: 7.2 % — ABNORMAL HIGH (ref 4.8–5.6)
Mean Plasma Glucose: 159.94 mg/dL

## 2019-06-27 LAB — URINALYSIS, ROUTINE W REFLEX MICROSCOPIC
Bilirubin Urine: NEGATIVE
Glucose, UA: 50 mg/dL — AB
Hgb urine dipstick: NEGATIVE
Ketones, ur: NEGATIVE mg/dL
Leukocytes,Ua: NEGATIVE
Nitrite: NEGATIVE
Protein, ur: NEGATIVE mg/dL
Specific Gravity, Urine: 1.025 (ref 1.005–1.030)
pH: 5 (ref 5.0–8.0)

## 2019-06-27 LAB — SURGICAL PCR SCREEN
MRSA, PCR: NEGATIVE
Staphylococcus aureus: NEGATIVE

## 2019-06-27 LAB — PROTIME-INR
INR: 1 (ref 0.8–1.2)
Prothrombin Time: 13.5 seconds (ref 11.4–15.2)

## 2019-06-27 LAB — APTT: aPTT: 32 seconds (ref 24–36)

## 2019-06-27 NOTE — Progress Notes (Signed)
Covid test scheduled for today No COVID symptoms of fever, runny nose, shortness of breath, cough, loss of smell or taste, vomiting or diarrhea. No H/o recent travel.  PCP - Dr Woody Seller, [facility] Pet[facility])  Cardiologist - Dr Carlyle Dolly  Chest x-ray -   EKG - 06-12-19  Stress Test - 06-17-19  ECHO - 11-14-17  Cardiac Cath - 11-13-17  AICD-denies PM-denies LOOP-denies  Sleep Study -denies  CPAP - NA  LABS-PCR, ,CMP,PT-INR,APTT,UA,T/S, A1C CBC- drawn at PCP office on 06/26/19. Records requested.  PLAVIX-LD 06/24/19 Continues to take ASA. Wife will confirm with Dr if needs to continue till DOS or hold.  ERAS-NA  HA1C-today Fasting Blood Sugar -176  Lo-30's(2-3 yrs back) Hi-300's)  Checks Blood Sugar __1___ times a day  Anesthesia-Y.  H/O STEMI, HTN, DM-2.  Pt denies having chest pain, sob, or fever at this time. All instructions explained to the pt, with a verbal understanding of the material. Pt agrees to go over the instructions while at home for a better understanding. Pt also instructed to self quarantine after being tested for COVID-19. The opportunity to ask questions was provided.

## 2019-06-28 LAB — NOVEL CORONAVIRUS, NAA (HOSP ORDER, SEND-OUT TO REF LAB; TAT 18-24 HRS): SARS-CoV-2, NAA: NOT DETECTED

## 2019-06-28 NOTE — Anesthesia Preprocedure Evaluation (Addendum)
Anesthesia Evaluation  Patient identified by MRN, date of birth, ID band Patient awake    Reviewed: Allergy & Precautions, H&P , NPO status , Patient's Chart, lab work & pertinent test results, reviewed documented beta blocker date and time   Airway Mallampati: II  TM Distance: >3 FB Neck ROM: full    Dental no notable dental hx. (+) Edentulous Upper, Edentulous Lower   Pulmonary neg pulmonary ROS, former smoker,    Pulmonary exam normal breath sounds clear to auscultation       Cardiovascular Exercise Tolerance: Good hypertension, Pt. on medications and Pt. on home beta blockers + CAD, + Past MI and + Cardiac Stents  + dysrhythmias Atrial Fibrillation  Rhythm:regular Rate:Normal     Neuro/Psych negative neurological ROS  negative psych ROS   GI/Hepatic negative GI ROS, Neg liver ROS,   Endo/Other  negative endocrine ROSdiabetes, Insulin Dependent  Renal/GU CRFRenal disease  negative genitourinary   Musculoskeletal  (+) Arthritis , Osteoarthritis,    Abdominal   Peds  Hematology negative hematology ROS (+)   Anesthesia Other Findings   Reproductive/Obstetrics negative OB ROS                           Anesthesia Physical Anesthesia Plan  ASA: III  Anesthesia Plan: General   Post-op Pain Management:    Induction: Intravenous  PONV Risk Score and Plan: 2 and Ondansetron and Treatment may vary due to age or medical condition  Airway Management Planned: Oral ETT and LMA  Additional Equipment:   Intra-op Plan:   Post-operative Plan: Extubation in OR  Informed Consent: I have reviewed the patients History and Physical, chart, labs and discussed the procedure including the risks, benefits and alternatives for the proposed anesthesia with the patient or authorized representative who has indicated his/her understanding and acceptance.     Dental Advisory Given  Plan Discussed with:  CRNA and Anesthesiologist  Anesthesia Plan Comments: (Follows with cardiology for hx of CAD (s/p stent to RCA and OM2 in 2006 and STEMI in 10/2017 with stenting of mid-RCA and PDA with DES x2) and postop afib (after hip surgery in 11/2017). He was seen 06/12/19 for preop clearance. Per note, the pt is not very active at baseline and due to his known hx of CAD and abnormal EKG a lexiscan myoview was ordered. Test done 9/28 showed large prior inferior/inferolateral myocardial infarction. No current ischemia. EF 55-65%. It was considered a low risk study.  Bernerd Pho, PA-C commented on test result "Please let the patient know his stress test showed evidence of his prior MI but no current blockages. Episodes of tachycardia noted and would continue on BB therapy with Metoprolol. He is cleared to proceed for planned Femoral to Posterior Tibial Artery Bypass surgery from a cardiac perspective."  Preop labs reviewed. IDDMII reasonably controlled A1c 7.2 Creatinine mildly elevated 1.35. CBC 06/25/19 received from PCP reviewed and WNL. Copy on chart.  Nuclear stress 06/17/19:  There was no ST segment deviation noted during stress.  PVCs and short runs of NSVT after injection, NSVT up to 9 beats  Findings consistent with large prior inferior/inferolateral myocardial infarction. No current ischemia  This is a low risk study. No current ischemia. NSVT may be related to prior scar.  The left ventricular ejection fraction is normal (55-65%).)      Anesthesia Quick Evaluation

## 2019-06-28 NOTE — Progress Notes (Signed)
Patient has history of antibodies and will need repeat type and screen DOS

## 2019-06-28 NOTE — Progress Notes (Signed)
Anesthesia Chart Review: Follows with cardiology for hx of CAD (s/p stent to RCA and OM2 in 2006 and STEMI in 10/2017 with stenting of mid-RCA and PDA with DES x2) and postop afib (after hip surgery in 11/2017). He was seen 06/12/19 for preop clearance. Per note, the pt is not very active at baseline and due to his known hx of CAD and abnormal EKG a lexiscan myoview was ordered. Test done 9/28 showed large prior inferior/inferolateral myocardial infarction. No current ischemia. EF 55-65%. It was considered a low risk study.  Bernerd Pho, PA-C commented on test result "Please let the patient know his stress test showed evidence of his prior MI but no current blockages. Episodes of tachycardia noted and would continue on BB therapy with Metoprolol. He is cleared to proceed for planned Femoral to Posterior Tibial Artery Bypass surgery from a cardiac perspective."  Preop labs reviewed. IDDMII reasonably controlled A1c 7.2 Creatinine mildly elevated 1.35. CBC 06/25/19 received from PCP reviewed and WNL. Copy on chart.  Nuclear stress 06/17/19:  There was no ST segment deviation noted during stress.  PVCs and short runs of NSVT after injection, NSVT up to 9 beats  Findings consistent with large prior inferior/inferolateral myocardial infarction. No current ischemia  This is a low risk study. No current ischemia. NSVT may be related to prior scar.  The left ventricular ejection fraction is normal (55-65%).   Wynonia Musty [facility] Short Stay Center/Anesthesiology Phone [phone] 06/28/2019 1:13 PM

## 2019-07-01 ENCOUNTER — Encounter (HOSPITAL_COMMUNITY)
Admission: RE | Disposition: A | Discharge status: Home Health | Payer: Self-pay | Source: Home / Self Care | Attending: Vascular Surgery

## 2019-07-01 ENCOUNTER — Inpatient Hospital Stay (HOSPITAL_COMMUNITY): Payer: Medicare HMO | Admitting: Certified Registered"

## 2019-07-01 ENCOUNTER — Encounter (HOSPITAL_COMMUNITY): Payer: Self-pay

## 2019-07-01 ENCOUNTER — Inpatient Hospital Stay (HOSPITAL_COMMUNITY)
Admission: RE | Admit: 2019-07-01 | Discharge: 2019-07-11 | DRG: 253 | Disposition: A | Discharge status: Home Health | Payer: Medicare HMO | Attending: Vascular Surgery | Admitting: Vascular Surgery

## 2019-07-01 ENCOUNTER — Other Ambulatory Visit: Payer: Self-pay

## 2019-07-01 ENCOUNTER — Inpatient Hospital Stay: Payer: Self-pay

## 2019-07-01 ENCOUNTER — Inpatient Hospital Stay (HOSPITAL_COMMUNITY): Payer: Medicare HMO | Admitting: Physician Assistant

## 2019-07-01 DIAGNOSIS — Z79891 Long term (current) use of opiate analgesic: Secondary | ICD-10-CM | POA: Diagnosis not present

## 2019-07-01 DIAGNOSIS — Z7984 Long term (current) use of oral hypoglycemic drugs: Secondary | ICD-10-CM | POA: Diagnosis not present

## 2019-07-01 DIAGNOSIS — G8929 Other chronic pain: Secondary | ICD-10-CM | POA: Diagnosis present

## 2019-07-01 DIAGNOSIS — Z96641 Presence of right artificial hip joint: Secondary | ICD-10-CM | POA: Diagnosis present

## 2019-07-01 DIAGNOSIS — D62 Acute posthemorrhagic anemia: Secondary | ICD-10-CM | POA: Diagnosis not present

## 2019-07-01 DIAGNOSIS — F22 Delusional disorders: Secondary | ICD-10-CM | POA: Diagnosis present

## 2019-07-01 DIAGNOSIS — I252 Old myocardial infarction: Secondary | ICD-10-CM

## 2019-07-01 DIAGNOSIS — Z7902 Long term (current) use of antithrombotics/antiplatelets: Secondary | ICD-10-CM

## 2019-07-01 DIAGNOSIS — I4891 Unspecified atrial fibrillation: Secondary | ICD-10-CM | POA: Diagnosis not present

## 2019-07-01 DIAGNOSIS — I959 Hypotension, unspecified: Secondary | ICD-10-CM | POA: Diagnosis not present

## 2019-07-01 DIAGNOSIS — Z888 Allergy status to other drugs, medicaments and biological substances status: Secondary | ICD-10-CM

## 2019-07-01 DIAGNOSIS — Z955 Presence of coronary angioplasty implant and graft: Secondary | ICD-10-CM

## 2019-07-01 DIAGNOSIS — I472 Ventricular tachycardia: Secondary | ICD-10-CM | POA: Diagnosis not present

## 2019-07-01 DIAGNOSIS — Z87891 Personal history of nicotine dependence: Secondary | ICD-10-CM

## 2019-07-01 DIAGNOSIS — E1151 Type 2 diabetes mellitus with diabetic peripheral angiopathy without gangrene: Principal | ICD-10-CM | POA: Diagnosis present

## 2019-07-01 DIAGNOSIS — Z79899 Other long term (current) drug therapy: Secondary | ICD-10-CM

## 2019-07-01 DIAGNOSIS — Z91013 Allergy to seafood: Secondary | ICD-10-CM

## 2019-07-01 DIAGNOSIS — I493 Ventricular premature depolarization: Secondary | ICD-10-CM | POA: Diagnosis not present

## 2019-07-01 DIAGNOSIS — I739 Peripheral vascular disease, unspecified: Secondary | ICD-10-CM | POA: Diagnosis present

## 2019-07-01 DIAGNOSIS — Z7982 Long term (current) use of aspirin: Secondary | ICD-10-CM | POA: Diagnosis not present

## 2019-07-01 DIAGNOSIS — M1909 Primary osteoarthritis, other specified site: Secondary | ICD-10-CM | POA: Diagnosis present

## 2019-07-01 DIAGNOSIS — I10 Essential (primary) hypertension: Secondary | ICD-10-CM | POA: Diagnosis present

## 2019-07-01 DIAGNOSIS — Z882 Allergy status to sulfonamides status: Secondary | ICD-10-CM | POA: Diagnosis not present

## 2019-07-01 DIAGNOSIS — I2510 Atherosclerotic heart disease of native coronary artery without angina pectoris: Secondary | ICD-10-CM | POA: Diagnosis present

## 2019-07-01 DIAGNOSIS — Z8249 Family history of ischemic heart disease and other diseases of the circulatory system: Secondary | ICD-10-CM

## 2019-07-01 DIAGNOSIS — E785 Hyperlipidemia, unspecified: Secondary | ICD-10-CM | POA: Diagnosis present

## 2019-07-01 DIAGNOSIS — I480 Paroxysmal atrial fibrillation: Secondary | ICD-10-CM | POA: Diagnosis present

## 2019-07-01 DIAGNOSIS — Z8673 Personal history of transient ischemic attack (TIA), and cerebral infarction without residual deficits: Secondary | ICD-10-CM

## 2019-07-01 DIAGNOSIS — Z419 Encounter for procedure for purposes other than remedying health state, unspecified: Secondary | ICD-10-CM

## 2019-07-01 HISTORY — PX: FEMORAL-TIBIAL BYPASS GRAFT: SHX938

## 2019-07-01 LAB — POCT I-STAT 4, (NA,K, GLUC, HGB,HCT)
Glucose, Bld: 141 mg/dL — ABNORMAL HIGH (ref 70–99)
HCT: 34 % — ABNORMAL LOW (ref 39.0–52.0)
Hemoglobin: 11.6 g/dL — ABNORMAL LOW (ref 13.0–17.0)
Potassium: 4.2 mmol/L (ref 3.5–5.1)
Sodium: 137 mmol/L (ref 135–145)

## 2019-07-01 LAB — GLUCOSE, CAPILLARY
Glucose-Capillary: 138 mg/dL — ABNORMAL HIGH (ref 70–99)
Glucose-Capillary: 201 mg/dL — ABNORMAL HIGH (ref 70–99)
Glucose-Capillary: 221 mg/dL — ABNORMAL HIGH (ref 70–99)
Glucose-Capillary: 241 mg/dL — ABNORMAL HIGH (ref 70–99)

## 2019-07-01 LAB — TYPE AND SCREEN
ABO/RH(D): O POS
Antibody Screen: POSITIVE

## 2019-07-01 SURGERY — CREATION, BYPASS, ARTERIAL, FEMORAL TO TIBIAL, USING GRAFT
Anesthesia: General | Site: Leg Upper | Laterality: Right

## 2019-07-01 MED ORDER — ROSUVASTATIN CALCIUM 20 MG PO TABS
20.0000 mg | ORAL_TABLET | Freq: Every day | ORAL | Status: DC
  Administered 2019-07-02 – 2019-07-11 (×10): 20 mg via ORAL
  Filled 2019-07-01 (×10): qty 1

## 2019-07-01 MED ORDER — 0.9 % SODIUM CHLORIDE (POUR BTL) OPTIME
TOPICAL | Status: DC | PRN
  Administered 2019-07-01 (×2): 1000 mL

## 2019-07-01 MED ORDER — INSULIN ASPART 100 UNIT/ML ~~LOC~~ SOLN
SUBCUTANEOUS | Status: AC
  Filled 2019-07-01: qty 1

## 2019-07-01 MED ORDER — SODIUM CHLORIDE 0.9 % IV SOLN: INTRAVENOUS | Status: DC

## 2019-07-01 MED ORDER — ONDANSETRON HCL 4 MG/2ML IJ SOLN: 4.0000 mg | Freq: Four times a day (QID) | INTRAMUSCULAR | Status: DC | PRN

## 2019-07-01 MED ORDER — MIDAZOLAM HCL 5 MG/5ML IJ SOLN
INTRAMUSCULAR | Status: DC | PRN
  Administered 2019-07-01: 2 mg via INTRAVENOUS

## 2019-07-01 MED ORDER — SODIUM CHLORIDE 0.9 % IV SOLN
INTRAVENOUS | Status: DC
  Administered 2019-07-01: 19:00:00 via INTRAVENOUS

## 2019-07-01 MED ORDER — FENTANYL CITRATE (PF) 250 MCG/5ML IJ SOLN
INTRAMUSCULAR | Status: AC
  Filled 2019-07-01: qty 5

## 2019-07-01 MED ORDER — DEXAMETHASONE SODIUM PHOSPHATE 10 MG/ML IJ SOLN
INTRAMUSCULAR | Status: DC | PRN
  Administered 2019-07-01: 4 mg via INTRAVENOUS

## 2019-07-01 MED ORDER — LINACLOTIDE 145 MCG PO CAPS
290.0000 ug | ORAL_CAPSULE | Freq: Every day | ORAL | Status: DC | PRN
  Administered 2019-07-09: 290 ug via ORAL
  Filled 2019-07-01 (×3): qty 2

## 2019-07-01 MED ORDER — POLYETHYLENE GLYCOL 3350 17 G PO PACK
17.0000 g | PACK | Freq: Every day | ORAL | Status: DC | PRN
  Administered 2019-07-08 – 2019-07-09 (×2): 17 g via ORAL
  Filled 2019-07-01 (×2): qty 1

## 2019-07-01 MED ORDER — PROTAMINE SULFATE 10 MG/ML IV SOLN
INTRAVENOUS | Status: AC
  Filled 2019-07-01: qty 10

## 2019-07-01 MED ORDER — CEFAZOLIN SODIUM-DEXTROSE 2-4 GM/100ML-% IV SOLN
2.0000 g | INTRAVENOUS | Status: AC
  Administered 2019-07-01 (×2): 2 g via INTRAVENOUS
  Filled 2019-07-01: qty 100

## 2019-07-01 MED ORDER — NITROGLYCERIN 0.4 MG SL SUBL: 0.4000 mg | SUBLINGUAL_TABLET | SUBLINGUAL | Status: DC | PRN

## 2019-07-01 MED ORDER — SODIUM CHLORIDE 0.9 % IV SOLN
INTRAVENOUS | Status: DC | PRN
  Administered 2019-07-01: 10 ug/min via INTRAVENOUS

## 2019-07-01 MED ORDER — DOCUSATE SODIUM 100 MG PO CAPS
100.0000 mg | ORAL_CAPSULE | Freq: Every day | ORAL | Status: DC
  Administered 2019-07-02 – 2019-07-11 (×10): 100 mg via ORAL
  Filled 2019-07-01 (×10): qty 1

## 2019-07-01 MED ORDER — PROTAMINE SULFATE 10 MG/ML IV SOLN
INTRAVENOUS | Status: DC | PRN
  Administered 2019-07-01: 20 mg via INTRAVENOUS
  Administered 2019-07-01: 10 mg via INTRAVENOUS
  Administered 2019-07-01: 20 mg via INTRAVENOUS
  Administered 2019-07-01: 10 mg via INTRAVENOUS
  Administered 2019-07-01 (×2): 20 mg via INTRAVENOUS

## 2019-07-01 MED ORDER — ONDANSETRON HCL 4 MG/2ML IJ SOLN
INTRAMUSCULAR | Status: DC | PRN
  Administered 2019-07-01: 4 mg via INTRAVENOUS

## 2019-07-01 MED ORDER — ROCURONIUM BROMIDE 10 MG/ML (PF) SYRINGE
PREFILLED_SYRINGE | INTRAVENOUS | Status: AC
  Filled 2019-07-01: qty 10

## 2019-07-01 MED ORDER — FENTANYL CITRATE (PF) 100 MCG/2ML IJ SOLN
INTRAMUSCULAR | Status: AC
  Administered 2019-07-01: 16:00:00 50 ug via INTRAVENOUS
  Filled 2019-07-01: qty 2

## 2019-07-01 MED ORDER — LABETALOL HCL 5 MG/ML IV SOLN: 10.0000 mg | INTRAVENOUS | Status: DC | PRN

## 2019-07-01 MED ORDER — HEMOSTATIC AGENTS (NO CHARGE) OPTIME
TOPICAL | Status: DC | PRN
  Administered 2019-07-01 (×2): 1 via TOPICAL

## 2019-07-01 MED ORDER — PHENOL 1.4 % MT LIQD: 1.0000 | OROMUCOSAL | Status: DC | PRN

## 2019-07-01 MED ORDER — ONDANSETRON HCL 4 MG/2ML IJ SOLN
INTRAMUSCULAR | Status: AC
  Filled 2019-07-01: qty 2

## 2019-07-01 MED ORDER — CHLORHEXIDINE GLUCONATE 4 % EX LIQD: 60.0000 mL | Freq: Once | CUTANEOUS | Status: DC

## 2019-07-01 MED ORDER — POTASSIUM CHLORIDE CRYS ER 20 MEQ PO TBCR: 20.0000 meq | EXTENDED_RELEASE_TABLET | Freq: Every day | ORAL | Status: DC | PRN

## 2019-07-01 MED ORDER — FENTANYL CITRATE (PF) 100 MCG/2ML IJ SOLN
INTRAMUSCULAR | Status: DC | PRN
  Administered 2019-07-01: 150 ug via INTRAVENOUS
  Administered 2019-07-01 (×3): 50 ug via INTRAVENOUS
  Administered 2019-07-01: 25 ug via INTRAVENOUS
  Administered 2019-07-01: 50 ug via INTRAVENOUS
  Administered 2019-07-01: 75 ug via INTRAVENOUS
  Administered 2019-07-01: 50 ug via INTRAVENOUS

## 2019-07-01 MED ORDER — ROCURONIUM BROMIDE 10 MG/ML (PF) SYRINGE
PREFILLED_SYRINGE | INTRAVENOUS | Status: DC | PRN
  Administered 2019-07-01: 60 mg via INTRAVENOUS

## 2019-07-01 MED ORDER — INSULIN ASPART 100 UNIT/ML ~~LOC~~ SOLN
0.0000 [IU] | Freq: Three times a day (TID) | SUBCUTANEOUS | Status: DC
  Administered 2019-07-01: 5 [IU] via SUBCUTANEOUS
  Administered 2019-07-02 (×2): 3 [IU] via SUBCUTANEOUS
  Administered 2019-07-03: 2 [IU] via SUBCUTANEOUS
  Administered 2019-07-05: 3 [IU] via SUBCUTANEOUS
  Administered 2019-07-05: 2 [IU] via SUBCUTANEOUS
  Administered 2019-07-08 – 2019-07-09 (×3): 3 [IU] via SUBCUTANEOUS
  Administered 2019-07-10 – 2019-07-11 (×4): 2 [IU] via SUBCUTANEOUS
  Filled 2019-07-01: qty 0.15

## 2019-07-01 MED ORDER — ACETAMINOPHEN 160 MG/5ML PO SOLN: 325.0000 mg | ORAL | Status: DC | PRN

## 2019-07-01 MED ORDER — ALUM & MAG HYDROXIDE-SIMETH 200-200-20 MG/5ML PO SUSP: 15.0000 mL | ORAL | Status: DC | PRN

## 2019-07-01 MED ORDER — FUROSEMIDE 20 MG PO TABS: 20.0000 mg | ORAL_TABLET | Freq: Every day | ORAL | Status: DC | PRN

## 2019-07-01 MED ORDER — HYDROCODONE-ACETAMINOPHEN 5-325 MG PO TABS
1.0000 | ORAL_TABLET | Freq: Four times a day (QID) | ORAL | Status: DC | PRN
  Administered 2019-07-01 – 2019-07-09 (×17): 2 via ORAL
  Administered 2019-07-09: 1 via ORAL
  Administered 2019-07-10 – 2019-07-11 (×4): 2 via ORAL
  Filled 2019-07-01 (×25): qty 2

## 2019-07-01 MED ORDER — BISACODYL 10 MG RE SUPP: 10.0000 mg | Freq: Every day | RECTAL | Status: DC | PRN

## 2019-07-01 MED ORDER — ONDANSETRON HCL 4 MG/2ML IJ SOLN
4.0000 mg | Freq: Once | INTRAMUSCULAR | Status: AC | PRN
  Administered 2019-07-01: 4 mg via INTRAVENOUS

## 2019-07-01 MED ORDER — LIDOCAINE 2% (20 MG/ML) 5 ML SYRINGE
INTRAMUSCULAR | Status: AC
  Filled 2019-07-01: qty 5

## 2019-07-01 MED ORDER — LIDOCAINE 2% (20 MG/ML) 5 ML SYRINGE
INTRAMUSCULAR | Status: DC | PRN
  Administered 2019-07-01: 60 mg via INTRAVENOUS

## 2019-07-01 MED ORDER — ASPIRIN EC 81 MG PO TBEC
81.0000 mg | DELAYED_RELEASE_TABLET | Freq: Every day | ORAL | Status: DC
  Administered 2019-07-02 – 2019-07-10 (×9): 81 mg via ORAL
  Filled 2019-07-01 (×9): qty 1

## 2019-07-01 MED ORDER — MORPHINE SULFATE (PF) 2 MG/ML IV SOLN
2.0000 mg | INTRAVENOUS | Status: DC | PRN
  Administered 2019-07-01 – 2019-07-09 (×10): 2 mg via INTRAVENOUS
  Filled 2019-07-01 (×10): qty 1

## 2019-07-01 MED ORDER — ACETAMINOPHEN 325 MG PO TABS
325.0000 mg | ORAL_TABLET | ORAL | Status: DC | PRN
  Administered 2019-07-02: 650 mg via ORAL
  Filled 2019-07-01: qty 2

## 2019-07-01 MED ORDER — LISINOPRIL 5 MG PO TABS
5.0000 mg | ORAL_TABLET | Freq: Every day | ORAL | Status: DC
  Administered 2019-07-01 – 2019-07-06 (×6): 5 mg via ORAL
  Filled 2019-07-01 (×7): qty 1

## 2019-07-01 MED ORDER — PROPOFOL 10 MG/ML IV BOLUS
INTRAVENOUS | Status: DC | PRN
  Administered 2019-07-01: 100 mg via INTRAVENOUS

## 2019-07-01 MED ORDER — HEMOSTATIC AGENTS (NO CHARGE) OPTIME
TOPICAL | Status: DC | PRN
  Administered 2019-07-01: 1 via TOPICAL

## 2019-07-01 MED ORDER — OXYCODONE HCL 5 MG/5ML PO SOLN: 5.0000 mg | Freq: Once | ORAL | Status: AC | PRN

## 2019-07-01 MED ORDER — GUAIFENESIN-DM 100-10 MG/5ML PO SYRP: 15.0000 mL | ORAL_SOLUTION | ORAL | Status: DC | PRN

## 2019-07-01 MED ORDER — OXYCODONE HCL 5 MG PO TABS
5.0000 mg | ORAL_TABLET | Freq: Once | ORAL | Status: AC | PRN
  Administered 2019-07-01: 18:00:00 5 mg via ORAL

## 2019-07-01 MED ORDER — ACETAMINOPHEN 325 MG RE SUPP: 325.0000 mg | RECTAL | Status: DC | PRN

## 2019-07-01 MED ORDER — CEFAZOLIN SODIUM-DEXTROSE 2-4 GM/100ML-% IV SOLN
2.0000 g | Freq: Three times a day (TID) | INTRAVENOUS | Status: AC
  Administered 2019-07-01 – 2019-07-02 (×2): 2 g via INTRAVENOUS
  Filled 2019-07-01 (×2): qty 100

## 2019-07-01 MED ORDER — VITAMIN B-12 1000 MCG PO TABS
1000.0000 ug | ORAL_TABLET | Freq: Every day | ORAL | Status: DC
  Administered 2019-07-02 – 2019-07-11 (×10): 1000 ug via ORAL
  Filled 2019-07-01 (×10): qty 1

## 2019-07-01 MED ORDER — SODIUM CHLORIDE 0.9 % IV SOLN
500.0000 mL | Freq: Once | INTRAVENOUS | Status: AC | PRN
  Administered 2019-07-06 – 2019-07-07 (×2): 500 mL via INTRAVENOUS

## 2019-07-01 MED ORDER — METFORMIN HCL 850 MG PO TABS
850.0000 mg | ORAL_TABLET | Freq: Every day | ORAL | Status: DC
  Administered 2019-07-02 – 2019-07-11 (×8): 850 mg via ORAL
  Filled 2019-07-01 (×10): qty 1

## 2019-07-01 MED ORDER — INSULIN GLARGINE 100 UNIT/ML ~~LOC~~ SOLN
28.0000 [IU] | Freq: Two times a day (BID) | SUBCUTANEOUS | Status: DC
  Administered 2019-07-01 – 2019-07-07 (×8): 28 [IU] via SUBCUTANEOUS
  Filled 2019-07-01 (×13): qty 0.28

## 2019-07-01 MED ORDER — ACETAMINOPHEN 325 MG PO TABS: 325.0000 mg | ORAL_TABLET | ORAL | Status: DC | PRN

## 2019-07-01 MED ORDER — OXYCODONE HCL 5 MG PO TABS
ORAL_TABLET | ORAL | Status: AC
  Administered 2019-07-01: 5 mg via ORAL
  Filled 2019-07-01: qty 1

## 2019-07-01 MED ORDER — MAGNESIUM SULFATE 2 GM/50ML IV SOLN: 2.0000 g | Freq: Every day | INTRAVENOUS | Status: DC | PRN

## 2019-07-01 MED ORDER — METOPROLOL TARTRATE 25 MG PO TABS
25.0000 mg | ORAL_TABLET | Freq: Two times a day (BID) | ORAL | Status: DC
  Administered 2019-07-01 – 2019-07-11 (×17): 25 mg via ORAL
  Filled 2019-07-01 (×18): qty 1

## 2019-07-01 MED ORDER — LACTATED RINGERS IV SOLN
INTRAVENOUS | Status: DC | PRN
  Administered 2019-07-01 (×3): via INTRAVENOUS

## 2019-07-01 MED ORDER — SODIUM CHLORIDE 0.9 % IV SOLN
INTRAVENOUS | Status: DC | PRN
  Administered 2019-07-01: 500 mL

## 2019-07-01 MED ORDER — ALBUMIN HUMAN 5 % IV SOLN
INTRAVENOUS | Status: DC | PRN
  Administered 2019-07-01: 13:00:00 via INTRAVENOUS

## 2019-07-01 MED ORDER — HEPARIN SODIUM (PORCINE) 1000 UNIT/ML IJ SOLN
INTRAMUSCULAR | Status: DC | PRN
  Administered 2019-07-01 (×2): 10000 [IU] via INTRAVENOUS

## 2019-07-01 MED ORDER — PANTOPRAZOLE SODIUM 40 MG PO TBEC
40.0000 mg | DELAYED_RELEASE_TABLET | Freq: Every day | ORAL | Status: DC
  Administered 2019-07-02 – 2019-07-11 (×9): 40 mg via ORAL
  Filled 2019-07-01 (×9): qty 1

## 2019-07-01 MED ORDER — METOPROLOL TARTRATE 5 MG/5ML IV SOLN
2.0000 mg | INTRAVENOUS | Status: AC | PRN
  Administered 2019-07-05 – 2019-07-06 (×2): 5 mg via INTRAVENOUS
  Filled 2019-07-01 (×2): qty 5

## 2019-07-01 MED ORDER — FENTANYL CITRATE (PF) 100 MCG/2ML IJ SOLN
25.0000 ug | INTRAMUSCULAR | Status: DC | PRN
  Administered 2019-07-01 (×2): 50 ug via INTRAVENOUS

## 2019-07-01 MED ORDER — DIAZEPAM 5 MG PO TABS
10.0000 mg | ORAL_TABLET | Freq: Every evening | ORAL | Status: DC | PRN
  Administered 2019-07-01 – 2019-07-04 (×3): 10 mg via ORAL
  Filled 2019-07-01 (×4): qty 2

## 2019-07-01 MED ORDER — SILVER SULFADIAZINE 1 % EX CREA
1.0000 "application " | TOPICAL_CREAM | Freq: Every day | CUTANEOUS | Status: DC
  Administered 2019-07-03 – 2019-07-11 (×6): 1 via TOPICAL
  Filled 2019-07-01: qty 85

## 2019-07-01 MED ORDER — MIDAZOLAM HCL 2 MG/2ML IJ SOLN
INTRAMUSCULAR | Status: AC
  Filled 2019-07-01: qty 2

## 2019-07-01 MED ORDER — MEPERIDINE HCL 25 MG/ML IJ SOLN: 6.2500 mg | INTRAMUSCULAR | Status: DC | PRN

## 2019-07-01 MED ORDER — PROPOFOL 10 MG/ML IV BOLUS
INTRAVENOUS | Status: AC
  Filled 2019-07-01: qty 20

## 2019-07-01 MED ORDER — SODIUM CHLORIDE 0.9 % IV SOLN
INTRAVENOUS | Status: AC
  Filled 2019-07-01: qty 1.2

## 2019-07-01 MED ORDER — HYDRALAZINE HCL 20 MG/ML IJ SOLN: 5.0000 mg | INTRAMUSCULAR | Status: DC | PRN

## 2019-07-01 MED ORDER — DEXAMETHASONE SODIUM PHOSPHATE 10 MG/ML IJ SOLN
INTRAMUSCULAR | Status: AC
  Filled 2019-07-01: qty 1

## 2019-07-01 SURGICAL SUPPLY — 65 items
ADH SKN CLS APL DERMABOND .7 (GAUZE/BANDAGES/DRESSINGS) ×1
AGENT HMST SPONGE THK3/8 (HEMOSTASIS) ×4
BANDAGE ESMARK 6X9 LF (GAUZE/BANDAGES/DRESSINGS) IMPLANT
BNDG CMPR 9X6 STRL LF SNTH (GAUZE/BANDAGES/DRESSINGS)
BNDG ESMARK 6X9 LF (GAUZE/BANDAGES/DRESSINGS)
CANISTER SUCT 3000ML PPV (MISCELLANEOUS) ×2 IMPLANT
CANNULA VESSEL 3MM 2 BLNT TIP (CANNULA) ×4 IMPLANT
CLIP VESOCCLUDE MED 24/CT (CLIP) ×2 IMPLANT
CLIP VESOCCLUDE SM WIDE 24/CT (CLIP) ×2 IMPLANT
COVER WAND RF STERILE (DRAPES) ×1 IMPLANT
CUFF TOURN SGL QUICK 24 (TOURNIQUET CUFF)
CUFF TOURN SGL QUICK 34 (TOURNIQUET CUFF)
CUFF TOURN SGL QUICK 42 (TOURNIQUET CUFF) IMPLANT
CUFF TRNQT CYL 24X4X16.5-23 (TOURNIQUET CUFF) IMPLANT
CUFF TRNQT CYL 34X4.125X (TOURNIQUET CUFF) IMPLANT
DERMABOND ADVANCED (GAUZE/BANDAGES/DRESSINGS) ×1
DERMABOND ADVANCED .7 DNX12 (GAUZE/BANDAGES/DRESSINGS) ×1 IMPLANT
DRAIN HEMOVAC 1/8 X 5 (WOUND CARE) IMPLANT
DRAPE HALF SHEET 40X57 (DRAPES) IMPLANT
DRAPE X-RAY CASS 24X20 (DRAPES) IMPLANT
DRSG COVADERM 4X10 (GAUZE/BANDAGES/DRESSINGS) ×1 IMPLANT
DRSG COVADERM 4X14 (GAUZE/BANDAGES/DRESSINGS) ×3 IMPLANT
DRSG COVADERM 4X8 (GAUZE/BANDAGES/DRESSINGS) ×1 IMPLANT
ELECT REM PT RETURN 9FT ADLT (ELECTROSURGICAL) ×2
ELECTRODE REM PT RTRN 9FT ADLT (ELECTROSURGICAL) ×1 IMPLANT
EVACUATOR SILICONE 100CC (DRAIN) IMPLANT
GAUZE 4X4 16PLY RFD (DISPOSABLE) ×3 IMPLANT
GLOVE BIO SURGEON STRL SZ 6.5 (GLOVE) ×6 IMPLANT
GLOVE BIO SURGEON STRL SZ7.5 (GLOVE) ×3 IMPLANT
GLOVE INDICATOR 7.5 STRL GRN (GLOVE) ×1 IMPLANT
GOWN STRL REUS W/ TWL LRG LVL3 (GOWN DISPOSABLE) ×3 IMPLANT
GOWN STRL REUS W/TWL LRG LVL3 (GOWN DISPOSABLE) ×12
GRAFT PROPATEN THIN WALL 6X80 (Vascular Products) ×1 IMPLANT
HEMOSTAT SPONGE AVITENE ULTRA (HEMOSTASIS) ×4 IMPLANT
KIT BASIN OR (CUSTOM PROCEDURE TRAY) ×2 IMPLANT
KIT TURNOVER KIT B (KITS) ×2 IMPLANT
LOOP VESSEL MAXI BLUE (MISCELLANEOUS) ×1 IMPLANT
LOOP VESSEL MINI RED (MISCELLANEOUS) ×1 IMPLANT
MARKER GRAFT CORONARY BYPASS (MISCELLANEOUS) ×1 IMPLANT
NS IRRIG 1000ML POUR BTL (IV SOLUTION) ×4 IMPLANT
PACK PERIPHERAL VASCULAR (CUSTOM PROCEDURE TRAY) ×2 IMPLANT
PAD ARMBOARD 7.5X6 YLW CONV (MISCELLANEOUS) ×4 IMPLANT
SET COLLECT BLD 21X3/4 12 (NEEDLE) IMPLANT
SPONGE LAP 18X18 RF (DISPOSABLE) ×2 IMPLANT
STAPLER VISISTAT 35W (STAPLE) ×2 IMPLANT
STOPCOCK 4 WAY LG BORE MALE ST (IV SETS) IMPLANT
SUT PROLENE 5 0 C 1 24 (SUTURE) ×4 IMPLANT
SUT PROLENE 6 0 CC (SUTURE) ×19 IMPLANT
SUT PROLENE 7 0 BV 1 (SUTURE) ×2 IMPLANT
SUT PROLENE 7 0 BV1 MDA (SUTURE) IMPLANT
SUT SILK 2 0 PERMA HAND 18 BK (SUTURE) ×3 IMPLANT
SUT SILK 2 0 SH (SUTURE) ×4 IMPLANT
SUT SILK 3 0 (SUTURE) ×4
SUT SILK 3-0 18XBRD TIE 12 (SUTURE) IMPLANT
SUT VIC AB 2-0 SH 27 (SUTURE) ×4
SUT VIC AB 2-0 SH 27XBRD (SUTURE) ×2 IMPLANT
SUT VIC AB 3-0 SH 27 (SUTURE) ×12
SUT VIC AB 3-0 SH 27X BRD (SUTURE) ×4 IMPLANT
SUT VICRYL 4-0 PS2 18IN ABS (SUTURE) ×4 IMPLANT
TAPE UMBILICAL COTTON 1/8X30 (MISCELLANEOUS) ×1 IMPLANT
TOWEL GREEN STERILE (TOWEL DISPOSABLE) ×2 IMPLANT
TRAY FOLEY MTR SLVR 16FR STAT (SET/KITS/TRAYS/PACK) ×2 IMPLANT
TUBING EXTENTION W/L.L. (IV SETS) IMPLANT
UNDERPAD 30X30 (UNDERPADS AND DIAPERS) ×2 IMPLANT
WATER STERILE IRR 1000ML POUR (IV SOLUTION) ×2 IMPLANT
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
## 2019-07-01 NOTE — Transfer of Care (Signed)
Immediate Anesthesia Transfer of Care Note  Patient: Dennis [name]  Procedure(s) Performed: Right  FEMORAL- POSTERIOR TIBIAL ARTERY BYPASS GRAFT with Composite PTFE and Reversed Saphenous Vein, RIGHT Common FEMORAL ENDARTERECTOMY with Profundoplasty (Right Leg Upper)  Patient Location: PACU  Anesthesia Type:General  Level of Consciousness: drowsy and patient cooperative  Airway & Oxygen Therapy: Patient Spontanous Breathing and Patient connected to nasal cannula oxygen  Post-op Assessment: Report given to RN, Post -op Vital signs reviewed and stable and Patient moving all extremities  Post vital signs: Reviewed and stable  Last Vitals:  Vitals Value Taken Time  BP 128/84 07/01/19 1528  Temp    Pulse 96 07/01/19 1531  Resp 7 07/01/19 1531  SpO2 98 % 07/01/19 1531  Vitals shown include unvalidated device data.  Last Pain: There were no vitals filed for this visit.       Complications: No apparent anesthesia complications
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
## 2019-07-01 NOTE — Progress Notes (Signed)
Pt received from PACU. VSS. R DP and PT dopplerable. CHG complete. Telemetry applied. Pt oriented to room and unit. Call light in reach.  Clyde Canterbury, RN

## 2019-07-01 NOTE — Progress Notes (Signed)
Yellow band removed and taken to wife [name] in waiting room.

## 2019-07-01 NOTE — Anesthesia Procedure Notes (Addendum)
Procedure Name: Intubation Date/Time: 07/01/2019 7:46 AM Performed by: Moshe Salisbury, CRNA Pre-anesthesia Checklist: Patient identified, Emergency Drugs available, Suction available and Patient being monitored Patient Re-evaluated:Patient Re-evaluated prior to induction Oxygen Delivery Method: Circle System Utilized Preoxygenation: Pre-oxygenation with 100% oxygen Induction Type: IV induction Ventilation: Mask ventilation without difficulty Laryngoscope Size: Mac and 3 Grade View: Grade II Tube type: Oral Tube size: 8.0 mm Number of attempts: 1 Airway Equipment and Method: Stylet and Oral airway Placement Confirmation: ETT inserted through vocal cords under direct vision,  positive ETCO2 and breath sounds checked- equal and bilateral Secured at: 21 cm Tube secured with: Tape Dental Injury: Teeth and Oropharynx as per pre-operative assessment

## 2019-07-02 ENCOUNTER — Encounter (HOSPITAL_COMMUNITY): Payer: Self-pay | Admitting: Vascular Surgery

## 2019-07-02 LAB — CBC
HCT: 26.6 % — ABNORMAL LOW (ref 39.0–52.0)
Hemoglobin: 9.2 g/dL — ABNORMAL LOW (ref 13.0–17.0)
MCH: 30.8 pg (ref 26.0–34.0)
MCHC: 34.6 g/dL (ref 30.0–36.0)
MCV: 89 fL (ref 80.0–100.0)
Platelets: 229 10*3/uL (ref 150–400)
RBC: 2.99 MIL/uL — ABNORMAL LOW (ref 4.22–5.81)
RDW: 13.1 % (ref 11.5–15.5)
WBC: 10 10*3/uL (ref 4.0–10.5)
nRBC: 0 % (ref 0.0–0.2)

## 2019-07-02 LAB — BASIC METABOLIC PANEL
Anion gap: 11 (ref 5–15)
BUN: 19 mg/dL (ref 8–23)
CO2: 23 mmol/L (ref 22–32)
Calcium: 8.2 mg/dL — ABNORMAL LOW (ref 8.9–10.3)
Chloride: 103 mmol/L (ref 98–111)
Creatinine, Ser: 1.33 mg/dL — ABNORMAL HIGH (ref 0.61–1.24)
GFR calc Af Amer: >60 mL/min (ref >60)
GFR calc non Af Amer: 53 mL/min — ABNORMAL LOW (ref >60)
Glucose, Bld: 180 mg/dL — ABNORMAL HIGH (ref 70–99)
Potassium: 4.5 mmol/L (ref 3.5–5.1)
Sodium: 137 mmol/L (ref 135–145)

## 2019-07-02 LAB — GLUCOSE, CAPILLARY
Glucose-Capillary: 154 mg/dL — ABNORMAL HIGH (ref 70–99)
Glucose-Capillary: 106 mg/dL — ABNORMAL HIGH (ref 70–99)
Glucose-Capillary: 169 mg/dL — ABNORMAL HIGH (ref 70–99)
Glucose-Capillary: 123 mg/dL — ABNORMAL HIGH (ref 70–99)

## 2019-07-02 NOTE — Evaluation (Signed)
Occupational Therapy Evaluation Patient Details Name: Dennis [name] MRN: [id number] DOB: Jan 19, 1946 Today's Date: 07/02/2019    History of Present Illness Patient is a 73 y/o male who presents with BLE pain with nonhealing wounds on right foot, s/p Rt CFA endarterectomy fem to PTA composite bypass 10/12. PMH includes chronic pain, CAD, s/p PCI, DM, HTN, STEMI, Rt THA.   Clinical Impression   Patient admitted for above and limited by problem list below, including impaired balance, pain in R LE and back, decreased activity tolerance and impaired balance.  He lives at home with his spouse who assists with LB ADLs as needed, reports using 2 SPCs for mobility (limited distance to household only). Today, he requires mod-max assist for bed mobility, mod assist +2 for transfers, setup for UB ADLs and mod-max assist +2 for LB ADLs.  He will benefit from continued OT services while admitted and after dc at [facility] level in order to maximize independence and safety with ADLs/mobility.     Follow Up Recommendations  Home health OT;Supervision/Assistance - 24 hour    Equipment Recommendations  None recommended by OT    Recommendations for Other Services       Precautions / Restrictions Precautions Precautions: Fall Restrictions Weight Bearing Restrictions: No      Mobility Bed Mobility Overal bed mobility: Needs Assistance Bed Mobility: Rolling;Sidelying to Sit Rolling: Max assist;+2 for physical assistance Sidelying to sit: Mod assist;HOB elevated       General bed mobility comments: HOB elevated to 30 degrees; Max A to roll towards left with assist to reach for rail, assist with trunk to get to EOB. INcreased time and difficulty.  Transfers Overall transfer level: Needs assistance Equipment used: Rolling walker (2 wheeled) Transfers: Sit to/from Stand Sit to Stand: Mod assist;+2 physical assistance         General transfer comment: requires mod assist +2 to power up from EOB with  R LE extended, cueing for hand placement and safety during transitions    Balance Overall balance assessment: Needs assistance Sitting-balance support: Feet supported;No upper extremity supported Sitting balance-Leahy Scale: Fair Sitting balance - Comments: Able to sit EOB wth Min guard to close supervision    Standing balance support: Bilateral upper extremity supported;During functional activity;Single extremity supported Standing balance-Leahy Scale: Poor Standing balance comment: reliant on at least 1 UE support functionally during ADLs, relaint on BUE support during mobiltiy                            ADL either performed or assessed with clinical judgement   ADL Overall ADL's : Needs assistance/impaired     Grooming: Set up;Sitting   Upper Body Bathing: Set up;Sitting   Lower Body Bathing: Sit to/from stand;+2 for physical assistance;Maximal assistance Lower Body Bathing Details (indicate cue type and reason): decreased reach to BLE and impaired balance  Upper Body Dressing : Set up;Sitting   Lower Body Dressing: Maximal assistance;+2 for physical assistance;Sit to/from stand Lower Body Dressing Details (indicate cue type and reason): mod assist +2 sit to stand, decreased reach to BLEs and requires assist for socks and threading underwear  Toilet Transfer: Moderate assistance;+2 for physical assistance;Ambulation Toilet Transfer Details (indicate cue type and reason): simulated to recliner          Functional mobility during ADLs: Moderate assistance;+2 for physical assistance;+2 for safety/equipment;Rolling walker;Cueing for sequencing;Cueing for safety General ADL Comments: limited by back and R LE pain, impaired balance, and  decreased activity tolerance     Vision Baseline Vision/History: Wears glasses Wears Glasses: At all times Patient Visual Report: No change from baseline Vision Assessment?: No apparent visual deficits     Perception     Praxis       Pertinent Vitals/Pain Pain Assessment: Faces Faces Pain Scale: Hurts whole lot Pain Location: back and RLE Pain Descriptors / Indicators: Sore;Discomfort;Grimacing;Guarding;Aching;Constant Pain Intervention(s): Monitored during session;Repositioned;Patient requesting pain meds-RN notified     Hand Dominance Right   Extremity/Trunk Assessment Upper Extremity Assessment Upper Extremity Assessment: Generalized weakness   Lower Extremity Assessment Lower Extremity Assessment: Defer to PT evaluation RLE Deficits / Details: Limited ankle AROM, not able to get to neutral and limited knee flexion/extension (reports this is baseline) Tightness throughout right hamstring/calf likely structural changes and more a contracture. RLE Sensation: decreased light touch   Cervical / Trunk Assessment Cervical / Trunk Assessment: Other exceptions Cervical / Trunk Exceptions: hx of chronic back pain   Communication Communication Communication: No difficulties   Cognition Arousal/Alertness: Awake/alert Behavior During Therapy: WFL for tasks assessed/performed Overall Cognitive Status: Impaired/Different from baseline Area of Impairment: Following commands;Orientation;Problem solving;Attention                 Orientation Level: Disoriented to;Time Current Attention Level: Sustained   Following Commands: Follows one step commands with increased time     Problem Solving: Slow processing;Difficulty sequencing;Requires verbal cues;Requires tactile cues General Comments: pt requires increased time for processing and problem solving, good awareness of deficits but somewhat self limiting; oriented x 2 (stated 4 months before saying the right one)   General Comments  wife present and supportive    Exercises     Shoulder Instructions      Home Living Family/patient expects to be discharged to:: Private residence Living Arrangements: Spouse/significant other Available Help at Discharge:  Family;Available PRN/intermittently Type of Home: House Home Access: Stairs to enter Entrance Stairs-Number of Steps: 2 vs 5 Entrance Stairs-Rails: Right;Left Home Layout: One level     Bathroom Shower/Tub: Teacher, early years/pre: Handicapped height Bathroom Accessibility: Yes   Home Equipment: Environmental consultant - 2 wheels;Cane - single point;Bedside commode;Shower seat          Prior Functioning/Environment Level of Independence: Needs assistance  Gait / Transfers Assistance Needed: Uses 2 SPCs for ambulation. Per wife, minimal household ambulator, mostly stays in bed due to chronic pain. ADL's / Homemaking Assistance Needed: Needs assist with LB ADLs and for bathing   Comments: Wife helps care for her elderly mother. Per wife, pt refuses to use RW, and shower chair.        OT Problem List: Decreased strength;Decreased activity tolerance;Impaired balance (sitting and/or standing);Decreased cognition;Decreased safety awareness;Decreased knowledge of use of DME or AE;Decreased knowledge of precautions;Pain      OT Treatment/Interventions: Self-care/ADL training;Therapeutic exercise;DME and/or AE instruction;Therapeutic activities;Patient/family education;Balance training    OT Goals(Current goals can be found in the care plan section) Acute Rehab OT Goals Patient Stated Goal: to get home and get pain better OT Goal Formulation: With patient Time For Goal Achievement: 07/16/19 Potential to Achieve Goals: Good  OT Frequency: Min 2X/week   Barriers to D/C:            Co-evaluation PT/OT/SLP Co-Evaluation/Treatment: Yes Reason for Co-Treatment: Necessary to address cognition/behavior during functional activity;For patient/therapist safety;To address functional/ADL transfers PT goals addressed during session: Mobility/safety with mobility;Strengthening/ROM;Proper use of DME OT goals addressed during session: ADL's and self-care      AM-PAC  OT "6 Clicks" Daily Activity      Outcome Measure Help from another person eating meals?: None Help from another person taking care of personal grooming?: A Little Help from another person toileting, which includes using toliet, bedpan, or urinal?: A Lot Help from another person bathing (including washing, rinsing, drying)?: A Lot Help from another person to put on and taking off regular upper body clothing?: A Little Help from another person to put on and taking off regular lower body clothing?: A Lot 6 Click Score: 16   End of Session Equipment Utilized During Treatment: Gait belt;Rolling walker Nurse Communication: Mobility status  Activity Tolerance: Patient tolerated treatment well Patient left: in chair;with call bell/phone within reach;with chair alarm set;with family/visitor present  OT Visit Diagnosis: Other abnormalities of gait and mobility (R26.89);Pain;Muscle weakness (generalized) (M62.81) Pain - Right/Left: Right Pain - part of body: Leg(back)                Time: [id number] OT Time Calculation (min): 25 min Charges:  OT General Charges $OT Visit: 1 Visit OT Evaluation $OT Eval Moderate Complexity: [place], OT Acute Rehabilitation Services Pager [phone] Office [phone]   Delight Stare 07/02/2019, 1:06 PM

## 2019-07-02 NOTE — Anesthesia Postprocedure Evaluation (Signed)
Anesthesia Post Note  Patient: ASKARI ANSARI  Procedure(s) Performed: Right  FEMORAL- POSTERIOR TIBIAL ARTERY BYPASS GRAFT with Composite PTFE and Reversed Saphenous Vein, RIGHT Common FEMORAL ENDARTERECTOMY with Profundoplasty (Right Leg Upper)     Patient location during evaluation: PACU Anesthesia Type: General Level of consciousness: awake and alert Pain management: pain level controlled Vital Signs Assessment: post-procedure vital signs reviewed and stable Respiratory status: spontaneous breathing, nonlabored ventilation and respiratory function stable Cardiovascular status: blood pressure returned to baseline and stable Postop Assessment: no apparent nausea or vomiting Anesthetic complications: no    Last Vitals:  Vitals:   07/02/19 0600 07/02/19 0733  BP: 132/83 (!) 115/54  Pulse:  89  Resp: 13 (!) 21  Temp:  36.7 C  SpO2: 100% 97%    Last Pain:  Vitals:   07/02/19 0733  TempSrc: Oral  PainSc:                  Lidia Collum

## 2019-07-02 NOTE — Evaluation (Signed)
Physical Therapy Evaluation Patient Details Name: Dennis [name] MRN: [id number] DOB: 19-Mar-1946 Today's Date: 07/02/2019   History of Present Illness  Patient is a 73 y/o male who presents with BLE pain with nonhealing wounds on right foot, s/p Rt CFA endarterectomy fem to PTA composite bypass 10/12. PMH includes chronic pain, CAD, s/p PCI, DM, HTN, STEMI, Rt THA.  Clinical Impression  Patient presents with acute RLE pain, chronic back pain, decreased AROM RLE, impaired sensation, decreased activity tolerance and impaired mobility s/p above. Pt lives at home with wife and is a minimal household ambulator using 2 SPCs for support. Wife assists with LB dressing and bathing. Today, pt requires Mod-Max A for bed mobility, Mod A of 2 for standing and Min guard for ambulation with use of RW. Attempted stretching out RLE however limited due to contracture in knee and decreased AROM at ankle. Encouraged walking 3 times per day with nursing. Will follow acutely to maximize independence and mobility prior to return home.     Follow Up Recommendations Home health PT;Supervision for mobility/OOB;Supervision/Assistance - 24 hour    Equipment Recommendations  None recommended by PT    Recommendations for Other Services       Precautions / Restrictions Precautions Precautions: Fall Restrictions Weight Bearing Restrictions: No      Mobility  Bed Mobility Overal bed mobility: Needs Assistance Bed Mobility: Rolling;Sidelying to Sit Rolling: Max assist;+2 for physical assistance Sidelying to sit: Mod assist;HOB elevated       General bed mobility comments: HOB elevated to 30 degrees; Max A to roll towards left with assist to reach for rail, assist with trunk to get to EOB. INcreased time and difficulty.  Transfers Overall transfer level: Needs assistance Equipment used: Rolling walker (2 wheeled) Transfers: Sit to/from Stand Sit to Stand: Mod assist;+2 physical assistance          General transfer comment: Assist to power to standing wtih RLE kicked out into extension, cues for hand placemet/technique. Transferred to chair post ambulation, prematurely trying to sit wthout turning all the way with bottom towards chair.  Ambulation/Gait Ambulation/Gait assistance: Min guard;+2 safety/equipment Gait Distance (Feet): 70 Feet Assistive device: Rolling walker (2 wheeled) Gait Pattern/deviations: Step-to pattern;Step-through pattern;Decreased stance time - right;Decreased step length - left;Trunk flexed;Wide base of support Gait velocity: decreased   General Gait Details: Slow, unsteady gait with RLE abducted and ER outside of RW, walking on toes. Not able to initiate heel strike due to decreased ankle AROM (reports chronic issue). Heavy reliance on BUEs on RW.  Stairs            Wheelchair Mobility    Modified Rankin (Stroke Patients Only)       Balance Overall balance assessment: Needs assistance Sitting-balance support: Feet supported;No upper extremity supported Sitting balance-Leahy Scale: Fair Sitting balance - Comments: Able to sit EOB wth Min guard, difficulty donning socks.   Standing balance support: During functional activity Standing balance-Leahy Scale: Poor Standing balance comment: Requires at least 1 UE support to pull up underwear, needs BUE support for ambulation.                             Pertinent Vitals/Pain Pain Assessment: Faces Faces Pain Scale: Hurts whole lot Pain Location: back and RLE Pain Descriptors / Indicators: Sore;Discomfort;Grimacing;Guarding;Aching;Constant Pain Intervention(s): Repositioned;Monitored during session;Limited activity within patient's tolerance;Patient requesting pain meds-RN notified    Home Living Family/patient expects to be discharged to:: Private residence  Living Arrangements: Spouse/significant other Available Help at Discharge: Family;Available PRN/intermittently Type of Home:  House Home Access: Stairs to enter Entrance Stairs-Rails: Right;Left Entrance Stairs-Number of Steps: 2 vs 5 Home Layout: One level Home Equipment: Walker - 2 wheels;Cane - single point;Bedside commode;Shower seat      Prior Function Level of Independence: Needs assistance   Gait / Transfers Assistance Needed: Uses 2 SPCs for ambulation. Per wife, minimal household ambulator, mostly stays in bed due to chronic pain.  ADL's / Homemaking Assistance Needed: Needs assist with LB ADLs and for bathing  Comments: Wife helps care for her elderly mother. Per wife, pt refuses to use RW, and shower chair.     Hand Dominance        Extremity/Trunk Assessment   Upper Extremity Assessment Upper Extremity Assessment: Defer to OT evaluation    Lower Extremity Assessment Lower Extremity Assessment: RLE deficits/detail RLE Deficits / Details: Limited ankle AROM, not able to get to neutral and limited knee flexion/extension (reports this is baseline) Tightness throughout right hamstring/calf likely structural changes and more a contracture. RLE Sensation: decreased light touch    Cervical / Trunk Assessment Cervical / Trunk Assessment: Other exceptions Cervical / Trunk Exceptions: hx of chronic back pain  Communication   Communication: No difficulties  Cognition Arousal/Alertness: Awake/alert Behavior During Therapy: WFL for tasks assessed/performed Overall Cognitive Status: Impaired/Different from baseline Area of Impairment: Following commands;Orientation;Problem solving;Attention                 Orientation Level: Disoriented to;Time Current Attention Level: Sustained   Following Commands: Follows one step commands with increased time     Problem Solving: Slow processing;Difficulty sequencing;Requires verbal cues;Requires tactile cues General Comments: Pt A&Ox2, stated 4 months before getting the right one. Slow to respond to questions.      General Comments General  comments (skin integrity, edema, etc.): Wife present during session and helped provide PLOF/history.    Exercises     Assessment/Plan    PT Assessment Patient needs continued PT services  PT Problem List Decreased strength;Decreased mobility;Decreased range of motion;Pain;Decreased balance;Impaired sensation;Decreased knowledge of use of DME;Decreased cognition;Decreased activity tolerance;Decreased skin integrity       PT Treatment Interventions Therapeutic activities;Gait training;Therapeutic exercise;Patient/family education;Balance training;Functional mobility training;DME instruction;Cognitive remediation;Stair training    PT Goals (Current goals can be found in the Care Plan section)  Acute Rehab PT Goals Patient Stated Goal: to get home and get pain better PT Goal Formulation: With patient Time For Goal Achievement: 07/16/19 Potential to Achieve Goals: Fair    Frequency Min 3X/week   Barriers to discharge Decreased caregiver support      Co-evaluation PT/OT/SLP Co-Evaluation/Treatment: Yes Reason for Co-Treatment: Necessary to address cognition/behavior during functional activity;For patient/therapist safety;To address functional/ADL transfers PT goals addressed during session: Mobility/safety with mobility;Strengthening/ROM;Proper use of DME         AM-PAC PT "6 Clicks" Mobility  Outcome Measure Help needed turning from your back to your side while in a flat bed without using bedrails?: Total Help needed moving from lying on your back to sitting on the side of a flat bed without using bedrails?: A Lot Help needed moving to and from a bed to a chair (including a wheelchair)?: A Lot Help needed standing up from a chair using your arms (e.g., wheelchair or bedside chair)?: A Lot Help needed to walk in hospital room?: A Little Help needed climbing 3-5 steps with a railing? : A Lot 6 Click Score: 12  End of Session Equipment Utilized During Treatment: Gait  belt Activity Tolerance: Patient limited by pain Patient left: in chair;with call bell/phone within reach;with chair alarm set;with family/visitor present Nurse Communication: Mobility status;Patient requests pain meds PT Visit Diagnosis: Pain;Difficulty in walking, not elsewhere classified (R26.2) Pain - Right/Left: Right Pain - part of body: Leg(back)    Time: 1034-1100 PT Time Calculation (min) (ACUTE ONLY): 26 min   Charges:   PT Evaluation $PT Eval Moderate Complexity: 1 Mod          Wray Kearns, PT, DPT Acute Rehabilitation Services Pager [phone] Office [place] 07/02/2019, 11:19 AM

## 2019-07-02 NOTE — Progress Notes (Addendum)
  Progress Note    07/02/2019 7:26 AM 1 Day Post-Op  Subjective:  Burning R foot toes 3-5   Vitals:   07/02/19 0500 07/02/19 0600  BP: (!) 113/54 132/83  Pulse:    Resp: 12 13  Temp:    SpO2: 97% 100%   Physical Exam: Lungs:  Non labored Incisions: incisions of RLE c/d/i Extremities:  R calf sore Neurologic: A&O  CBC    Component Value Date/Time   WBC 10.0 07/02/2019 0323   RBC 2.99 (L) 07/02/2019 0323   HGB 9.2 (L) 07/02/2019 0323   HCT 26.6 (L) 07/02/2019 0323   HCT 31.9 (L) 12/17/2017 0338   PLT 229 07/02/2019 0323   MCV 89.0 07/02/2019 0323   MCH 30.8 07/02/2019 0323   MCHC 34.6 07/02/2019 0323   RDW 13.1 07/02/2019 0323   LYMPHSABS 3.3 12/22/2017 0433   MONOABS 1.1 (H) 12/22/2017 0433   EOSABS 0.4 12/22/2017 0433   BASOSABS 0.1 12/22/2017 0433    BMET    Component Value Date/Time   NA 137 07/02/2019 0323   K 4.5 07/02/2019 0323   CL 103 07/02/2019 0323   CO2 23 07/02/2019 0323   GLUCOSE 180 (H) 07/02/2019 0323   BUN 19 07/02/2019 0323   CREATININE 1.33 (H) 07/02/2019 0323   CALCIUM 8.2 (L) 07/02/2019 0323   GFRNONAA 53 (L) 07/02/2019 0323   GFRAA >60 07/02/2019 0323    INR    Component Value Date/Time   INR 1.0 06/27/2019 1225     Intake/Output Summary (Last 24 hours) at 07/02/2019 0727 Last data filed at 07/02/2019 0600 Gross per 24 hour  Intake 3795.05 ml  Output 1650 ml  Net 2145.05 ml     Assessment/Plan:  73 y.o. male is s/p R CFA endart, femoral to PTA composite bypass 1 Day Post-Op   Perfusing RLE well with brisk PTA and DP by doppler Encouraged OOB today Hold plavix until discharge Home when pain controlled and mobility improved   Dagoberto Ligas, PA-C [facility] [phone] 07/02/2019 7:27 AM   I have independently interviewed and examined patient and agree with PA assessment and plan above. Confused this a.m. but redirectable. Palpable right pt.   Domitila Stetler C. Donzetta Matters, MD [facility] Office: [phone] Pager: [phone]

## 2019-07-03 LAB — GLUCOSE, CAPILLARY
Glucose-Capillary: 89 mg/dL (ref 70–99)
Glucose-Capillary: 115 mg/dL — ABNORMAL HIGH (ref 70–99)
Glucose-Capillary: 128 mg/dL — ABNORMAL HIGH (ref 70–99)
Glucose-Capillary: 88 mg/dL (ref 70–99)

## 2019-07-03 NOTE — Progress Notes (Signed)
Physical Therapy Treatment Patient Details Name: Dennis [name] MRN: [id number] DOB: 03-26-1946 Today's Date: 07/03/2019    History of Present Illness Patient is a 73 y/o male who presents with BLE pain with nonhealing wounds on right foot, s/p Rt CFA endarterectomy fem to PTA composite bypass 10/12. PMH includes chronic pain, CAD, s/p PCI, DM, HTN, STEMI, Rt THA.    PT Comments    Pt was sitting EOB when I arrived - he had just stood and urinated with nursing.  His resting HR 120bpm.  Pt not wanting to sit up in recliner - it hurts his back.  Pt reports hurting too bad to walk - he has just had his pain meds.  Pt reports right inner calf pain 20/10 and back pain 10/10.  Pt has chronic back pain - discussed this - he takes only tylenol and advil at home - opiods dont help his pain. He has had some jelly shots in his back that helped some - but hasnt had this recently.  Pts HR 120 at rest sitting and 134 standing with RW.  Pt did 5 sit to stands for me today - unabel to walk.  I talked about DC plans - wife concerned if she can help him like this - she has no additional help and also cares for her elderly mom who lives in a different place.  Pt agreed to hear about SNF setting to get daily therapy.  Will see how he does moving tomorrow.  Pt very cooperatve with me and wife very supportive.   Follow Up Recommendations  Supervision/Assistance - 24 hour;SNF     Equipment Recommendations  None recommended by PT    Recommendations for Other Services       Precautions / Restrictions Precautions Precautions: Fall;Back Precaution Comments: pt has severe chronic back pain - teach good body mechanics Restrictions Weight Bearing Restrictions: No Other Position/Activity Restrictions: pts right leg has been tight for years - he has walked on toes for years    Mobility  Bed Mobility Overal bed mobility: Needs Assistance Bed Mobility: Sit to Supine       Sit to supine: Max assist   General  bed mobility comments: pt educated on good body mechanics - laying dwon on his side with rali - then rolling back onto his back.  Transfers Overall transfer level: Needs assistance Equipment used: Rolling walker (2 wheeled) Transfers: Sit to/from Stand Sit to Stand: Min assist;From elevated surface         General transfer comment: cueing for hand placement and safety during transfers.  pt did 5 sit to stands from higher bed  Ambulation/Gait             General Gait Details: pt unabel to walk today - hurting too bad.  HR 120s sitting EOB.  pt did side step to get higher in the bed   Stairs             Wheelchair Mobility    Modified Rankin (Stroke Patients Only)       Balance     Sitting balance-Leahy Scale: Fair Sitting balance - Comments: sat EOB with close supervision - pt safe sitting - just unabel to get comfortable     Standing balance-Leahy Scale: Poor Standing balance comment: pt standing with flexed posture - leaning a lot on Rw for support  Cognition Arousal/Alertness: Awake/alert Behavior During Therapy: WFL for tasks assessed/performed Overall Cognitive Status: Impaired/Different from baseline                                 General Comments: pt sitting EOB with nursing when i arrived. he had just tried to stand and urinate. He knows he needs to move around - lots of history of chronic pain. He was cooperative with me - but limited by pain.      Exercises Other Exercises Other Exercises: pt sitting EOB - worked on doing AROM with right knee in sitting -h e has about 15 degrees of active movement in knee - did 10 reps.  encouraged this    General Comments General comments (skin integrity, edema, etc.): wife present and supportive      Pertinent Vitals/Pain Pain Score: (pt reports right leg 20/10 and back 10/10) Pain Descriptors / Indicators:  Sore;Discomfort;Grimacing;Guarding;Aching;Constant Pain Intervention(s): Limited activity within patient's tolerance;Repositioned;Monitored during session;Premedicated before session    Home Living                      Prior Function            PT Goals (current goals can now be found in the care plan section) Progress towards PT goals: Not progressing toward goals - comment    Frequency    Min 3X/week      PT Plan Discharge plan needs to be updated    Co-evaluation              AM-PAC PT "6 Clicks" Mobility   Outcome Measure  Help needed turning from your back to your side while in a flat bed without using bedrails?: A Lot Help needed moving from lying on your back to sitting on the side of a flat bed without using bedrails?: A Lot Help needed moving to and from a bed to a chair (including a wheelchair)?: A Lot Help needed standing up from a chair using your arms (e.g., wheelchair or bedside chair)?: A Lot Help needed to walk in hospital room?: A Lot Help needed climbing 3-5 steps with a railing? : Total 6 Click Score: 11    End of Session   Activity Tolerance: Patient limited by pain Patient left: in bed;with call bell/phone within reach;with family/visitor present Nurse Communication: Mobility status PT Visit Diagnosis: Pain;Difficulty in walking, not elsewhere classified (R26.2) Pain - Right/Left: Right Pain - part of body: Leg     Time: 1450-1510 PT Time Calculation (min) (ACUTE ONLY): 20 min  Charges:  $Therapeutic Activity: 8-22 mins                     07/03/2019   Rande Lawman, PT    Loyal Buba 07/03/2019, 3:22 PM

## 2019-07-03 NOTE — Plan of Care (Signed)
Problem: Education: Goal: Knowledge of prescribed regimen will improve 07/03/2019 1713 by Glenard Haring, RN Outcome: Progressing 07/03/2019 1440 by Glenard Haring, RN Outcome: Progressing   Problem: Activity: Goal: Ability to tolerate increased activity will improve 07/03/2019 1713 by Glenard Haring, RN Outcome: Progressing 07/03/2019 1440 by Glenard Haring, RN Outcome: Progressing   Problem: Bowel/Gastric: Goal: Gastrointestinal status for postoperative course will improve 07/03/2019 1713 by Glenard Haring, RN Outcome: Progressing 07/03/2019 1440 by Glenard Haring, RN Outcome: Progressing   Problem: Clinical Measurements: Goal: Postoperative complications will be avoided or minimized 07/03/2019 1713 by Glenard Haring, RN Outcome: Progressing 07/03/2019 1440 by Glenard Haring, RN Outcome: Progressing Goal: Signs and symptoms of graft occlusion will improve 07/03/2019 1713 by Glenard Haring, RN Outcome: Progressing 07/03/2019 1440 by Glenard Haring, RN Outcome: Progressing   Problem: Skin Integrity: Goal: Demonstration of wound healing without infection will improve 07/03/2019 1713 by Glenard Haring, RN Outcome: Progressing 07/03/2019 1440 by Glenard Haring, RN Outcome: Progressing   Problem: Education: Goal: Knowledge of General Education information will improve Description: Including pain rating scale, medication(s)/side effects and non-pharmacologic comfort measures 07/03/2019 1713 by Glenard Haring, RN Outcome: Progressing 07/03/2019 1440 by Glenard Haring, RN Outcome: Progressing   Problem: Health Behavior/Discharge Planning: Goal: Ability to manage health-related needs will improve 07/03/2019 1713 by Glenard Haring, RN Outcome: Progressing 07/03/2019 1440 by Glenard Haring, RN Outcome: Progressing   Problem: Clinical Measurements: Goal: Ability to maintain clinical measurements within normal limits will  improve 07/03/2019 1713 by Glenard Haring, RN Outcome: Progressing 07/03/2019 1440 by Glenard Haring, RN Outcome: Progressing Goal: Will remain free from infection 07/03/2019 1713 by Glenard Haring, RN Outcome: Progressing 07/03/2019 1440 by Glenard Haring, RN Outcome: Progressing Goal: Diagnostic test results will improve 07/03/2019 1713 by Glenard Haring, RN Outcome: Progressing 07/03/2019 1440 by Glenard Haring, RN Outcome: Progressing Goal: Respiratory complications will improve 07/03/2019 1713 by Glenard Haring, RN Outcome: Progressing 07/03/2019 1440 by Glenard Haring, RN Outcome: Progressing Goal: Cardiovascular complication will be avoided 07/03/2019 1713 by Glenard Haring, RN Outcome: Progressing 07/03/2019 1440 by Glenard Haring, RN Outcome: Progressing   Problem: Activity: Goal: Risk for activity intolerance will decrease 07/03/2019 1713 by Glenard Haring, RN Outcome: Progressing 07/03/2019 1440 by Glenard Haring, RN Outcome: Progressing   Problem: Nutrition: Goal: Adequate nutrition will be maintained 07/03/2019 1713 by Glenard Haring, RN Outcome: Progressing 07/03/2019 1440 by Glenard Haring, RN Outcome: Progressing   Problem: Coping: Goal: Level of anxiety will decrease 07/03/2019 1713 by Glenard Haring, RN Outcome: Progressing 07/03/2019 1440 by Glenard Haring, RN Outcome: Progressing   Problem: Elimination: Goal: Will not experience complications related to bowel motility 07/03/2019 1713 by Glenard Haring, RN Outcome: Progressing 07/03/2019 1440 by Glenard Haring, RN Outcome: Progressing Goal: Will not experience complications related to urinary retention 07/03/2019 1713 by Glenard Haring, RN Outcome: Progressing 07/03/2019 1440 by Glenard Haring, RN Outcome: Progressing   Problem: Pain Managment: Goal: General experience of comfort will improve 07/03/2019 1713 by Glenard Haring, RN Outcome: Progressing 07/03/2019 1440 by Glenard Haring, RN Outcome: Progressing   Problem: Safety: Goal: Ability to remain free from injury will improve 07/03/2019 1713 by Glenard Haring, RN Outcome: Progressing 07/03/2019 1440 by Glenard Haring, RN Outcome: Progressing   Problem: Skin Integrity: Goal: Risk for impaired skin integrity will decrease 07/03/2019 1713  by Glenard Haring, RN Outcome: Progressing 07/03/2019 1440 by Glenard Haring, RN Outcome: Progressing

## 2019-07-03 NOTE — Plan of Care (Signed)
  Problem: Education: Goal: Knowledge of prescribed regimen will improve Outcome: Progressing   Problem: Activity: Goal: Ability to tolerate increased activity will improve Outcome: Progressing   Problem: Bowel/Gastric: Goal: Gastrointestinal status for postoperative course will improve Outcome: Progressing   Problem: Clinical Measurements: Goal: Postoperative complications will be avoided or minimized Outcome: Progressing Goal: Signs and symptoms of graft occlusion will improve Outcome: Progressing   Problem: Skin Integrity: Goal: Demonstration of wound healing without infection will improve Outcome: Progressing   Problem: Education: Goal: Knowledge of General Education information will improve Description: Including pain rating scale, medication(s)/side effects and non-pharmacologic comfort measures Outcome: Progressing   Problem: Health Behavior/Discharge Planning: Goal: Ability to manage health-related needs will improve Outcome: Progressing   Problem: Clinical Measurements: Goal: Ability to maintain clinical measurements within normal limits will improve Outcome: Progressing Goal: Will remain free from infection Outcome: Progressing Goal: Diagnostic test results will improve Outcome: Progressing Goal: Respiratory complications will improve Outcome: Progressing Goal: Cardiovascular complication will be avoided Outcome: Progressing   Problem: Activity: Goal: Risk for activity intolerance will decrease Outcome: Progressing   Problem: Nutrition: Goal: Adequate nutrition will be maintained Outcome: Progressing   Problem: Coping: Goal: Level of anxiety will decrease Outcome: Progressing   Problem: Elimination: Goal: Will not experience complications related to bowel motility Outcome: Progressing Goal: Will not experience complications related to urinary retention Outcome: Progressing   Problem: Pain Managment: Goal: General experience of comfort will  improve Outcome: Progressing   Problem: Safety: Goal: Ability to remain free from injury will improve Outcome: Progressing   Problem: Skin Integrity: Goal: Risk for impaired skin integrity will decrease Outcome: Progressing   

## 2019-07-03 NOTE — Progress Notes (Addendum)
  Progress Note    07/03/2019 7:23 AM 2 Days Post-Op  Subjective:  Says the burning in his 4th toe is a little better today.  Tm 99.9 now afebrile HR 90's-110's NSR/ST [id number] systolic [id number] RA  Vitals:   07/03/19 0206 07/03/19 0448  BP: 106/67 (!) 108/52  Pulse:  95  Resp: 12 15  Temp: 97.7 F (36.5 C) 97.9 F (36.6 C)  SpO2: 99% 99%    Physical Exam: Cardiac:  regular Lungs:  Non labored Incisions:  Right groin is clean and dry; medial lower leg incision with bloody drainage from distal portion of the incision.  Extremities:  Palpable right PT pulse   CBC    Component Value Date/Time   WBC 10.0 07/02/2019 0323   RBC 2.99 (L) 07/02/2019 0323   HGB 9.2 (L) 07/02/2019 0323   HCT 26.6 (L) 07/02/2019 0323   HCT 31.9 (L) 12/17/2017 0338   PLT 229 07/02/2019 0323   MCV 89.0 07/02/2019 0323   MCH 30.8 07/02/2019 0323   MCHC 34.6 07/02/2019 0323   RDW 13.1 07/02/2019 0323   LYMPHSABS 3.3 12/22/2017 0433   MONOABS 1.1 (H) 12/22/2017 0433   EOSABS 0.4 12/22/2017 0433   BASOSABS 0.1 12/22/2017 0433    BMET    Component Value Date/Time   NA 137 07/02/2019 0323   K 4.5 07/02/2019 0323   CL 103 07/02/2019 0323   CO2 23 07/02/2019 0323   GLUCOSE 180 (H) 07/02/2019 0323   BUN 19 07/02/2019 0323   CREATININE 1.33 (H) 07/02/2019 0323   CALCIUM 8.2 (L) 07/02/2019 0323   GFRNONAA 53 (L) 07/02/2019 0323   GFRAA >60 07/02/2019 0323    INR    Component Value Date/Time   INR 1.0 06/27/2019 1225     Intake/Output Summary (Last 24 hours) at 07/03/2019 0723 Last data filed at 07/03/2019 0600 Gross per 24 hour  Intake 730.51 ml  Output 290 ml  Net 440.51 ml     Assessment:  73 y.o. male is s/p:  R CFA endart, femoral to PTA composite bypass   2 Days Post-Op  Plan: -pt with patent bypass with palpable right PT pulse. -mild bloody ooze from medial lower leg incision distally.  Gauze and 6" ace wrap for gentle compression placed.  Leg elevation for  swelling.  -will check labs tomorrow -DVT prophylaxis:  Use SCD on left leg-will hold on sq heparin for now given mild bloody ooze from distal incision.  Continue to hold plavix until discharge. -mobilize more today.  PT/OT recommending HHPT/OT.  Face to face order placed.    Leontine Locket, PA-C [facility] [phone] 07/03/2019 7:23 AM  Agree with above.  Some swelling right leg not unexpected with lots of venous and lymphatic disruption.  Still having pain with ambulation Some incisional ooze  ACE wrap leg, elevate when possible  Try to ambulate some  Possible d/c Friday  Agree with SCDs for now.    Ruta Hinds, MD [facility] [place] Office: [phone] Pager: [phone]

## 2019-07-04 ENCOUNTER — Encounter (HOSPITAL_COMMUNITY): Payer: Medicare HMO

## 2019-07-04 ENCOUNTER — Inpatient Hospital Stay (HOSPITAL_COMMUNITY): Payer: Medicare HMO

## 2019-07-04 DIAGNOSIS — I739 Peripheral vascular disease, unspecified: Secondary | ICD-10-CM

## 2019-07-04 LAB — CBC
HCT: 24.1 % — ABNORMAL LOW (ref 39.0–52.0)
Hemoglobin: 8.2 g/dL — ABNORMAL LOW (ref 13.0–17.0)
MCH: 30.8 pg (ref 26.0–34.0)
MCHC: 34 g/dL (ref 30.0–36.0)
MCV: 90.6 fL (ref 80.0–100.0)
Platelets: 238 10*3/uL (ref 150–400)
RBC: 2.66 MIL/uL — ABNORMAL LOW (ref 4.22–5.81)
RDW: 13.3 % (ref 11.5–15.5)
WBC: 9.8 10*3/uL (ref 4.0–10.5)
nRBC: 0 % (ref 0.0–0.2)

## 2019-07-04 LAB — BASIC METABOLIC PANEL
Anion gap: 6 (ref 5–15)
BUN: 24 mg/dL — ABNORMAL HIGH (ref 8–23)
CO2: 26 mmol/L (ref 22–32)
Calcium: 7.9 mg/dL — ABNORMAL LOW (ref 8.9–10.3)
Chloride: 106 mmol/L (ref 98–111)
Creatinine, Ser: 1.32 mg/dL — ABNORMAL HIGH (ref 0.61–1.24)
GFR calc Af Amer: >60 mL/min (ref >60)
GFR calc non Af Amer: 54 mL/min — ABNORMAL LOW (ref >60)
Glucose, Bld: 79 mg/dL (ref 70–99)
Potassium: 4.1 mmol/L (ref 3.5–5.1)
Sodium: 138 mmol/L (ref 135–145)

## 2019-07-04 LAB — GLUCOSE, CAPILLARY
Glucose-Capillary: 82 mg/dL (ref 70–99)
Glucose-Capillary: 95 mg/dL (ref 70–99)
Glucose-Capillary: 108 mg/dL — ABNORMAL HIGH (ref 70–99)
Glucose-Capillary: 108 mg/dL — ABNORMAL HIGH (ref 70–99)
Glucose-Capillary: 174 mg/dL — ABNORMAL HIGH (ref 70–99)

## 2019-07-04 MED ORDER — LORAZEPAM 2 MG/ML IJ SOLN
1.0000 mg | Freq: Once | INTRAMUSCULAR | Status: AC
  Administered 2019-07-04: 23:00:00 1 mg via INTRAVENOUS

## 2019-07-04 MED ORDER — LORAZEPAM 2 MG/ML IJ SOLN
INTRAMUSCULAR | Status: AC
  Administered 2019-07-04: 23:00:00 1 mg via INTRAVENOUS
  Filled 2019-07-04: qty 1

## 2019-07-04 NOTE — Progress Notes (Signed)
CSW called patient's spouse left voice message to return call.   Thurmond Butts, MSW, [place] Social Worker [phone]

## 2019-07-04 NOTE — Care Management Important Message (Signed)
Important Message  Patient Details  Name: VINT MASLEN MRN: [id number] Date of Birth: Jul 09, 1946   Medicare Important Message Given:  Yes     Shelda Altes 07/04/2019, 2:04 PM

## 2019-07-04 NOTE — Progress Notes (Signed)
Throughout beginning of shift pt woke up multiple times with confusion. On previous night shifts caring for this pt, he would wake up confused about his location and disposition but was easily reoriented. Tonight pt required phone conversations with wife in order to reorient secondary to his distrust of staff and claim that he was not in the hospital.  @approx . 2200 pt woke up again confused and belligerent. A phone call with his wife calmed him but his HR remained elevated and appeared irregular.  @2223  EKG obtained to assess increased HR. While the rate was accelerated, AFib was not noted ([employer] vs. ST-see Results Review for 12 lead EKG). Immediately upon completion of EKG, pt became combative: striking at this RN with a balled-up fist and shouting "you'll not kill me tonight". Additional staff came to bedside and pt emergently restrained for his safety with BL soft wrist restraints and siderails up x4.  @2232  Paged Dr. Donzetta Matters on-call for Dr. Oneida Alar regarding above. Restraint order obtained along with 1 time dose of 1mg  IV Ativan. Pt's wife, [name], paged and updated on pt's condition via pt's cell phone and was allowed to speak with pt via speaker phone to attempt to reorient pt. Pt wife understood the necessity of restraints at this time and was appreciative of staff's assistance. Pt wife was assured by this RN that she would be alerted if pt condition changed and that restraints would be removed as soon as pt could remain safe without them.  Ativan administered @2243 . Pt began to calm and was asleep by 2258. Will continue to monitor and assess closely and update pertinent parties as necessary.

## 2019-07-04 NOTE — Progress Notes (Signed)
ABI's have been completed. Preliminary results can be found in CV Proc through chart review.   07/04/19 4:21 PM Dennis [name] RVT

## 2019-07-04 NOTE — Progress Notes (Signed)
Occupational Therapy Treatment Patient Details Name: Dennis [name] MRN: [id number] DOB: December 07, 1945 Today's Date: 07/04/2019    History of present illness Patient is a 73 y/o male who presents with BLE pain with nonhealing wounds on right foot, s/p Rt CFA endarterectomy fem to PTA composite bypass 10/12. PMH includes chronic pain, CAD, s/p PCI, DM, HTN, STEMI, Rt THA.   OT comments  Pt continues to be limited by back pain. Reports pain 8/10 upon arrival, but agreeable to session. Education provided on incorporating back precautions functionally to assist with pain management. Pt required MIN - MAX A for bed mobility; cues to initiate log roll technique MAX A to elevate trunk. Pt MIN A +2 stand pivot transfer from EOB>recliner with RW. Pt MAX A for LB dressing from EOB; pt reports he used his cane to assist with donning socks at home. DC plan remains appropriate contingent on continued progress towards OT goals. Will continue to follow acutely for OT needs.   Follow Up Recommendations  Home health OT;Supervision/Assistance - 24 hour    Equipment Recommendations  None recommended by OT    Recommendations for Other Services      Precautions / Restrictions Precautions Precautions: Fall;Back Precaution Comments: pt has severe chronic back pain - teach good body mechanics; education provided on incorporating back precautions to assist with pain management Restrictions Weight Bearing Restrictions: No Other Position/Activity Restrictions: pts right leg has been tight for years - he has walked on toes for years       Mobility Bed Mobility Overal bed mobility: Needs Assistance Bed Mobility: Rolling;Sidelying to Sit Rolling: Min assist Sidelying to sit: Max assist       General bed mobility comments: educated on log roll technique to assist with pain; cues for sequencing; MAX A to elevate trunk  Transfers Overall transfer level: Needs assistance Equipment used: Rolling walker (2  wheeled) Transfers: Sit to/from [facility] Sit to Stand: Min guard;From elevated surface;+2 safety/equipment Stand pivot transfers: Min assist;+2 safety/equipment;From elevated surface       General transfer comment: pt with increased pain upon standing but able to stand pivot to recliner with MIN A +2 for safety    Balance Overall balance assessment: Needs assistance Sitting-balance support: Feet supported;No upper extremity supported Sitting balance-Leahy Scale: Fair Sitting balance - Comments: sat EOB with close supervision   Standing balance support: Bilateral upper extremity supported Standing balance-Leahy Scale: Poor Standing balance comment: reliant on BUE support                           ADL either performed or assessed with clinical judgement   ADL Overall ADL's : Needs assistance/impaired                     Lower Body Dressing: Maximal assistance;+2 for physical assistance;Sit to/from stand Lower Body Dressing Details (indicate cue type and reason): MAX to don socks at EOB; reports uses cane at home to don socks Toilet Transfer: Minimal assistance;+2 for safety/equipment;Stand-pivot;RW Toilet Transfer Details (indicate cue type and reason): simulated to recliner          Functional mobility during ADLs: Minimal assistance;+2 for safety/equipment;Rolling walker General ADL Comments: limited by back and R LE pain, session focus on functional transfers; MIN A +2 for safety; MAX A LB ADLs     Vision Baseline Vision/History: Wears glasses Wears Glasses: At all times Patient Visual Report: No change from baseline Vision Assessment?: No  apparent visual deficits   Perception     Praxis      Cognition Arousal/Alertness: Awake/alert Behavior During Therapy: WFL for tasks assessed/performed Overall Cognitive Status: Impaired/Different from baseline Area of Impairment: Following commands;Problem solving                        Following Commands: Follows one step commands with increased time;Follows multi-step commands with increased time     Problem Solving: Slow processing;Decreased initiation;Requires verbal cues General Comments: slow to initiate movement, maybe partly d/t pain, requires increased time to process        Exercises     Shoulder Instructions       General Comments      Pertinent Vitals/ Pain       Pain Score: 8  Pain Location: back and RLE Pain Descriptors / Indicators: Sore;Discomfort;Grimacing;Guarding;Aching;Constant Pain Intervention(s): Limited activity within patient's tolerance;Monitored during session;Repositioned  Home Living                                          Prior Functioning/Environment              Frequency  Min 2X/week        Progress Toward Goals  OT Goals(current goals can now be found in the care plan section)  Progress towards OT goals: Progressing toward goals  Acute Rehab OT Goals Patient Stated Goal: to get home and get pain better OT Goal Formulation: With patient Time For Goal Achievement: 07/16/19 Potential to Achieve Goals: Good  Plan Discharge plan remains appropriate    Co-evaluation                 AM-PAC OT "6 Clicks" Daily Activity     Outcome Measure   Help from another person eating meals?: None Help from another person taking care of personal grooming?: A Little Help from another person toileting, which includes using toliet, bedpan, or urinal?: A Lot Help from another person bathing (including washing, rinsing, drying)?: A Lot Help from another person to put on and taking off regular upper body clothing?: A Little Help from another person to put on and taking off regular lower body clothing?: A Lot 6 Click Score: 16    End of Session Equipment Utilized During Treatment: Gait belt;Rolling walker  OT Visit Diagnosis: Other abnormalities of gait and mobility (R26.89);Pain;Muscle  weakness (generalized) (M62.81) Pain - Right/Left: Right Pain - part of body: Leg   Activity Tolerance Patient tolerated treatment well   Patient Left in chair;with call bell/phone within reach;with chair alarm set   Nurse Communication Mobility status;Other (comment)(c/o too much pressure from ace wrap on RLE)        Time: [id number] OT Time Calculation (min): 23 min  Charges: OT General Charges $OT Visit: 1 Visit OT Treatments $Therapeutic Activity: 23-37 mins  [place], [facility] [phone] [place] 07/04/2019, 11:34 AM

## 2019-07-04 NOTE — Progress Notes (Addendum)
  Progress Note    07/04/2019 7:12 AM 3 Days Post-Op  Subjective:  Says his hips hurt.  Foot feels ok.   Tm 99.5 now afebrile HR 70's-90's NSR 99991111 systolic 99991111 RA  Vitals:   07/04/19 0140 07/04/19 0335  BP: (!) 104/50 (!) 96/56  Pulse:  88  Resp: (!) 25 16  Temp:  97.7 F (36.5 C)  SpO2:  99%    Physical Exam: Cardiac:  regular Lungs:  Non labored Incisions:  Right groin and saphenous vein harvest sites clean with staples in tact.  Right lower leg incision wrapped and dry Extremities:  Brisk doppler signals right PT/peroneal   CBC    Component Value Date/Time   WBC 10.0 07/02/2019 0323   RBC 2.99 (L) 07/02/2019 0323   HGB 9.2 (L) 07/02/2019 0323   HCT 26.6 (L) 07/02/2019 0323   HCT 31.9 (L) 12/17/2017 0338   PLT 229 07/02/2019 0323   MCV 89.0 07/02/2019 0323   MCH 30.8 07/02/2019 0323   MCHC 34.6 07/02/2019 0323   RDW 13.1 07/02/2019 0323   LYMPHSABS 3.3 12/22/2017 0433   MONOABS 1.1 (H) 12/22/2017 0433   EOSABS 0.4 12/22/2017 0433   BASOSABS 0.1 12/22/2017 0433    BMET    Component Value Date/Time   NA 137 07/02/2019 0323   K 4.5 07/02/2019 0323   CL 103 07/02/2019 0323   CO2 23 07/02/2019 0323   GLUCOSE 180 (H) 07/02/2019 0323   BUN 19 07/02/2019 0323   CREATININE 1.33 (H) 07/02/2019 0323   CALCIUM 8.2 (L) 07/02/2019 0323   GFRNONAA 53 (L) 07/02/2019 0323   GFRAA >60 07/02/2019 0323    INR    Component Value Date/Time   INR 1.0 06/27/2019 1225     Intake/Output Summary (Last 24 hours) at 07/04/2019 [id number] Last data filed at 07/04/2019 0300 Gross per 24 hour  Intake 770 ml  Output 1225 ml  Net -455 ml     Assessment:  73 y.o. male is s/p:  R CFA endart, femoral to PTA composite bypass   3 Days Post-Op  Plan: -pt with brisk doppler signals right PT/peroneal -bandage on RLE is dry  -needs to mobilize more.  Says he didn't walk much.  May need SNF.  Will ask social work to see pt. -labs ordered-not done this am -DVT  prophylaxis:  SCD's for now   Leontine Locket, [place] [facility] [phone] 07/04/2019 7:12 AM  Agree with above  Patient with minimal ambulation yesterday.  I emphasized to him that he needs to get out of bed and walk today to try to improve overall leg function and prevent contractures.  He will try to do this.  Ruta Hinds, MD [facility] [place] Office: [phone] Pager: [phone]   His bypass is patent.  Really only limitation to discharge his mobility and pain control at this point.

## 2019-07-04 NOTE — Progress Notes (Signed)
Physical Therapy Treatment Patient Details Name: Dennis [name] MRN: [id number] DOB: [birth year] Today's Date: 07/04/2019    History of Present Illness Patient is a 73 y/o male who presents with BLE pain with nonhealing wounds on right foot, s/p Rt CFA endarterectomy fem to PTA composite bypass 10/12. PMH includes chronic pain, CAD, s/p PCI, DM, HTN, STEMI, Rt THA.    PT Comments    Pt was seen for mobility and strength training and note his struggle with pain and positioning on bed.  Pt is trying to get into a compressed RLE posture on R side and discouraged this.  Pt was instead repositioned with RLE in protected and elevated position, much more comfortable for him.  See acutely to work on increasing his gait distance, progressing him to a higher level of mobility and shortening his need to stay in rehab before returning home.    Follow Up Recommendations  SNF     Equipment Recommendations  None recommended by PT    Recommendations for Other Services       Precautions / Restrictions Precautions Precautions: Fall;Back Restrictions Weight Bearing Restrictions: No    Mobility  Bed Mobility Overal bed mobility: Needs Assistance Bed Mobility: Sit to Supine       Sit to supine: Max assist   General bed mobility comments: pt is trying to lie on RLE in bed and had to actively discourage this  Transfers Overall transfer level: Needs assistance Equipment used: Rolling walker (2 wheeled) Transfers: Sit to/from Stand Sit to Stand: Mod assist         General transfer comment: increased assist due to RLE pain which pt suddenly had in sitting with RLE elevated on leg rest  Ambulation/Gait Ambulation/Gait assistance: Min assist Gait Distance (Feet): 7 Feet Assistive device: Rolling walker (2 wheeled) Gait Pattern/deviations: Wide base of support;Decreased stride length;Decreased stance time - right Gait velocity: decreased Gait velocity interpretation: <1.31 ft/sec,  indicative of household ambulator General Gait Details: had pain to stand on RLE and so did not walk far, very short trip to bed.   Stairs             Wheelchair Mobility    Modified Rankin (Stroke Patients Only)       Balance     Sitting balance-Leahy Scale: Fair       Standing balance-Leahy Scale: Poor Standing balance comment: poor due to RLE pain                            Cognition Arousal/Alertness: Awake/alert Behavior During Therapy: WFL for tasks assessed/performed Overall Cognitive Status: Impaired/Different from baseline Area of Impairment: Safety/judgement;Awareness;Problem solving                   Current Attention Level: Selective   Following Commands: Follows one step commands inconsistently;Follows one step commands with increased time Safety/Judgement: Decreased awareness of safety Awareness: Intellectual Problem Solving: Slow processing;Requires verbal cues General Comments: requires reminders for moving and initiating transfers      Exercises General Exercises - Lower Extremity Ankle Circles/Pumps: AAROM;Both;5 reps Hip ABduction/ADduction: AAROM;Right;10 reps    General Comments General comments (skin integrity, edema, etc.): pt was not sure when he had been medicated and so contacted nursing      Pertinent Vitals/Pain Pain Assessment: 0-10 Pain Score: 9  Pain Location: R leg, not much with back Pain Descriptors / Indicators: Operative site guarding;Grimacing Pain Intervention(s): Limited activity within patient's tolerance;Monitored  during session;Premedicated before session;Repositioned;Patient requesting pain meds-RN notified    Home Living                      Prior Function            PT Goals (current goals can now be found in the care plan section) Acute Rehab PT Goals Patient Stated Goal: to get home and get pain better Progress towards PT goals: Progressing toward goals     Frequency    Min 3X/week      PT Plan Current plan remains appropriate    Co-evaluation              AM-PAC PT "6 Clicks" Mobility   Outcome Measure  Help needed turning from your back to your side while in a flat bed without using bedrails?: A Lot Help needed moving from lying on your back to sitting on the side of a flat bed without using bedrails?: A Lot Help needed moving to and from a bed to a chair (including a wheelchair)?: A Lot Help needed standing up from a chair using your arms (e.g., wheelchair or bedside chair)?: A Lot Help needed to walk in hospital room?: A Lot Help needed climbing 3-5 steps with a railing? : Total 6 Click Score: 11    End of Session Equipment Utilized During Treatment: Gait belt Activity Tolerance: Patient limited by pain Patient left: in bed;with call bell/phone within reach;with family/visitor present Nurse Communication: Mobility status PT Visit Diagnosis: Pain;Muscle weakness (generalized) (M62.81);Other abnormalities of gait and mobility (R26.89) Pain - Right/Left: Right Pain - part of body: Leg;Ankle and joints of foot     Time: 1202-1236 PT Time Calculation (min) (ACUTE ONLY): 34 min  Charges:  $Gait Training: 8-22 mins $Therapeutic Exercise: 8-22 mins                     Ramond Dial 07/04/2019, 5:19 PM   Mee Hives, PT MS Acute Rehab Dept. Number: [place] [facility] [place]

## 2019-07-05 ENCOUNTER — Inpatient Hospital Stay (HOSPITAL_COMMUNITY): Payer: Medicare HMO

## 2019-07-05 LAB — GLUCOSE, CAPILLARY
Glucose-Capillary: 117 mg/dL — ABNORMAL HIGH (ref 70–99)
Glucose-Capillary: 123 mg/dL — ABNORMAL HIGH (ref 70–99)
Glucose-Capillary: 132 mg/dL — ABNORMAL HIGH (ref 70–99)
Glucose-Capillary: 147 mg/dL — ABNORMAL HIGH (ref 70–99)
Glucose-Capillary: 154 mg/dL — ABNORMAL HIGH (ref 70–99)

## 2019-07-05 MED ORDER — LORAZEPAM 2 MG/ML IJ SOLN
1.0000 mg | Freq: Four times a day (QID) | INTRAMUSCULAR | Status: DC | PRN
  Administered 2019-07-05 – 2019-07-08 (×3): 1 mg via INTRAVENOUS
  Filled 2019-07-05 (×3): qty 1

## 2019-07-05 MED ORDER — LORAZEPAM 2 MG/ML IJ SOLN
INTRAMUSCULAR | Status: AC
  Filled 2019-07-05: qty 1

## 2019-07-05 MED ORDER — LORAZEPAM 2 MG/ML IJ SOLN
1.0000 mg | Freq: Once | INTRAMUSCULAR | Status: AC
  Administered 2019-07-05: 1 mg via INTRAVENOUS

## 2019-07-05 NOTE — Progress Notes (Signed)
Late Entry: @approx  0145 pt became agitated again (shouting that staff were going to kill him and that they should get out of his house). Dr. Donzetta Matters paged @0156  and additional 1mg  IV Ativan ordered and administered. Pt became calmer but remained confused.  Will continue to monitor and assess.

## 2019-07-05 NOTE — Progress Notes (Signed)
Patient yelling and cursing this morning at staff members. Disoriented to place, time and situation. States "get the hell out of my house". Spouse called. MD wanted to know if patient drinks alcohol at all at home, spouse denies. Spouse states she'll be here around 10-11am. Patient remains in soft wrist restraints.  No s/s of acute distress. Nursing will continue to monitor.

## 2019-07-05 NOTE — Progress Notes (Addendum)
  Progress Note    07/05/2019 7:19 AM 4 Days Post-Op  Subjective:  Very agitated this morning and verbally abusive to staff.  Tm 99.8 now afebrile HR 90's-130's  [id number] systolic [id number] RA  Vitals:   07/05/19 0500 07/05/19 0630  BP: 121/85 140/75  Pulse:    Resp: (!) 35 14  Temp:    SpO2: 98% 100%    Physical Exam: Cardiac:  regular Lungs:  Non labored Incisions:  Right groin is clean and dry Extremities:  Brisk right PT doppler signal   CBC    Component Value Date/Time   WBC 9.8 07/04/2019 0801   RBC 2.66 (L) 07/04/2019 0801   HGB 8.2 (L) 07/04/2019 0801   HCT 24.1 (L) 07/04/2019 0801   HCT 31.9 (L) 12/17/2017 0338   PLT 238 07/04/2019 0801   MCV 90.6 07/04/2019 0801   MCH 30.8 07/04/2019 0801   MCHC 34.0 07/04/2019 0801   RDW 13.3 07/04/2019 0801   LYMPHSABS 3.3 12/22/2017 0433   MONOABS 1.1 (H) 12/22/2017 0433   EOSABS 0.4 12/22/2017 0433   BASOSABS 0.1 12/22/2017 0433    BMET    Component Value Date/Time   NA 138 07/04/2019 0801   K 4.1 07/04/2019 0801   CL 106 07/04/2019 0801   CO2 26 07/04/2019 0801   GLUCOSE 79 07/04/2019 0801   BUN 24 (H) 07/04/2019 0801   CREATININE 1.32 (H) 07/04/2019 0801   CALCIUM 7.9 (L) 07/04/2019 0801   GFRNONAA 54 (L) 07/04/2019 0801   GFRAA >60 07/04/2019 0801    INR    Component Value Date/Time   INR 1.0 06/27/2019 1225     Intake/Output Summary (Last 24 hours) at 07/05/2019 0719 Last data filed at 07/04/2019 2210 Gross per 24 hour  Intake 240 ml  Output 550 ml  Net -310 ml     Assessment:  73 y.o. male is s/p:  R CFA endart, femoral to PTA composite bypass   4 Days Post-Op  Plan: -pt very agitated and confused overnight and this morning.   Received 2 doses of ativan overnight with mild relief.  Pt's wife called overnight and spoke with pt via speaker phone.  He is not alert to time, place or president.   Tachycardic with agitation.  EKG without afib. -turn on all lights and raise blinds when  the sun comes up.  Continue mild restraints until he is more oriented to protect pt and staff and he swung at RN last night.   Hopefully wife can come up to hospital again today.  She could not come last night as she cares for her mother.   -labs yesterday stable.  One time low grade fever of 99.8 over night, otherwise, has been afebrile x 24hrs.  -DVT prophylaxis:  hgb okay yesterday and pt tolerating but still down.  Will continue to hold plavix and sq heparin and continue SCD's. -pt has not had BM since admission.  If pt will allow, duloclox supp   Leontine Locket, PA-C [facility] [phone] 07/05/2019 7:19 AM  Good doppler signals in foot Still with some drainage from below knee incision and edema Confused today.  No other neuro symptoms According to wife did this with prior operations No heavy ETOH history Head CT today Hopefully more ambulatory as less confused  Ruta Hinds, MD [facility] [place] Office: [phone] Pager: [phone]

## 2019-07-06 LAB — GLUCOSE, CAPILLARY
Glucose-Capillary: 133 mg/dL — ABNORMAL HIGH (ref 70–99)
Glucose-Capillary: 121 mg/dL — ABNORMAL HIGH (ref 70–99)
Glucose-Capillary: 113 mg/dL — ABNORMAL HIGH (ref 70–99)
Glucose-Capillary: 119 mg/dL — ABNORMAL HIGH (ref 70–99)

## 2019-07-06 NOTE — Progress Notes (Signed)
Restraints removed at this time. Patient alert and oriented today. No agitation or combativeness. Will continue to monitor.

## 2019-07-06 NOTE — Progress Notes (Signed)
Pt Hr 130's chronic afib 5 mg lopressor given, pt calm will continue to monitor

## 2019-07-06 NOTE — Plan of Care (Signed)
Continue to monitor

## 2019-07-06 NOTE — Progress Notes (Signed)
Patient remains out of restraints and doing well. Patient asleep at this time

## 2019-07-06 NOTE — TOC Progression Note (Signed)
Transition of Care [facility]) - Progression Note    Patient Details  Name: ABDIKADIR WHYBREW MRN: [id number] Date of Birth: Oct 17, 1945  Transition of Care [facility]) CM/SW [place], [place] Phone Number: 07/06/2019, 12:32 PM  Clinical Narrative:     CSW called spouse- left voice message to return call. CSW unable to complete assessment.   Thurmond Butts, MSW, [facility] Clinical Social Worker [phone]   Expected Discharge Plan: [place]    Expected Discharge Plan and Services Expected Discharge Plan: [place]                                               Social Determinants of Health (SDOH) Interventions    Readmission Risk Interventions No flowsheet data found.

## 2019-07-06 NOTE — Progress Notes (Signed)
Alerted that pt was off the monitor went into patients room he had loosend one restraint and had his arm wrapped around his neck, with his legs hanging out the bed. Pt confused to place, time and situation states that he is " going to [place]" explained to pt his situation. Pt denies he has had surgery, will continue to monitor

## 2019-07-06 NOTE — Progress Notes (Addendum)
  Progress Note    07/06/2019 8:49 AM 5 Days Post-Op  Subjective:  More calm this morning.  C/o pain in his neck.    Tm 99 HR 80's-120's  0000000 systolic [id number] RA  Vitals:   07/06/19 0430 07/06/19 0800  BP: (!) 104/48 (!) 105/46  Pulse: 89 86  Resp:  18  Temp: 99 F (37.2 C)   SpO2: 99% 100%    Physical Exam: Cardiac:  regular Lungs:  Non labored Incisions:  Right groin and thigh incisions look good.  Ace wrap in place lower leg and is clean and dry Extremities:  Brisk right PT doppler signal   CBC    Component Value Date/Time   WBC 9.8 07/04/2019 0801   RBC 2.66 (L) 07/04/2019 0801   HGB 8.2 (L) 07/04/2019 0801   HCT 24.1 (L) 07/04/2019 0801   HCT 31.9 (L) 12/17/2017 0338   PLT 238 07/04/2019 0801   MCV 90.6 07/04/2019 0801   MCH 30.8 07/04/2019 0801   MCHC 34.0 07/04/2019 0801   RDW 13.3 07/04/2019 0801   LYMPHSABS 3.3 12/22/2017 0433   MONOABS 1.1 (H) 12/22/2017 0433   EOSABS 0.4 12/22/2017 0433   BASOSABS 0.1 12/22/2017 0433    BMET    Component Value Date/Time   NA 138 07/04/2019 0801   K 4.1 07/04/2019 0801   CL 106 07/04/2019 0801   CO2 26 07/04/2019 0801   GLUCOSE 79 07/04/2019 0801   BUN 24 (H) 07/04/2019 0801   CREATININE 1.32 (H) 07/04/2019 0801   CALCIUM 7.9 (L) 07/04/2019 0801   GFRNONAA 54 (L) 07/04/2019 0801   GFRAA >60 07/04/2019 0801    INR    Component Value Date/Time   INR 1.0 06/27/2019 1225     Intake/Output Summary (Last 24 hours) at 07/06/2019 0849 Last data filed at 07/06/2019 0440 Gross per 24 hour  Intake 240 ml  Output 700 ml  Net -460 ml    Head CT 07/05/2019: IMPRESSION: 1. No evidence of acute intracranial abnormality. 2. Atrophy, chronic small-vessel white matter ischemic changes and remote RIGHT caudate lacunar infarct.   Assessment:  73 y.o. male is s/p:  R CFA endart, femoral to PTA composite bypass  5 Days Post-Op  Plan: -pt with brisk doppler signals right foot -pt less confused this  morning and oriented to place and year.  Head CT yesterday with no evidence of acute intracranial abnormality.  It does show changes for remote right caudate lacunar infarct -continue SCD's for now-still had mild drainage from yesterday.  Check labs tomorrow and if hgb stable, may be able to start sq heparin.  Will restart Plavix at discharge.     Leontine Locket, PA-C [facility] [phone] 07/06/2019 8:49 AM  I agree with the above.  Head CT was unremarkable for acute findings.  There were chronic changes.  I will repeat UA today.  Annamarie Major

## 2019-07-06 NOTE — Progress Notes (Signed)
Occupational Therapy Treatment Patient Details Name: Dennis [name] MRN: [id number] DOB: 03-11-1946 Today's Date: 07/06/2019    History of present illness Patient is a 73 y/o male who presents with BLE pain with nonhealing wounds on right foot, s/p Rt CFA endarterectomy fem to PTA composite bypass 10/12. PMH includes chronic pain, CAD, s/p PCI, DM, HTN, STEMI, Rt THA.   OT comments  Pt continues to be limited by pain,  AxOx3  this session and agreeable to session. Pt MAX A for bed mobility to incorporate log roll method. x2 trials of rolling as pt pulled legs back onto bed in sidelying during first trial d/t pain. MAX A to elevate trunk into sitting. Pt required MOD- MIN guard assist to maintain sitting balance EOB. Pt declined ADLS at EOB d/t  back pain in sitting. Pt stood from EOB with RW and MOD A. HR spiked to 160s with mobility. Pt able to take side steps up to [facility] but requires MAX cues for sequencing of task. Pt tends to keep his eyes closed throughout session but reports it is d/t his sensitivity to light.Will continue to follow acutely for OT needs. DC plan remains appropriate contingent on progression towards OT goals.    Follow Up Recommendations  Home health OT;Supervision/Assistance - 24 hour    Equipment Recommendations  None recommended by OT    Recommendations for Other Services      Precautions / Restrictions Precautions Precautions: Fall;Back Precaution Comments: pt has severe chronic back pain - teach good body mechanics; education provided on incorporating back precautions to assist with pain management Restrictions Weight Bearing Restrictions: No Other Position/Activity Restrictions: pts right leg has been tight for years - he has walked on toes for years       Mobility Bed Mobility Overal bed mobility: Needs Assistance Bed Mobility: Rolling;Sidelying to Sit;Sit to Sidelying Rolling: Min assist Sidelying to sit: Max assist     Sit to sidelying: Max  assist General bed mobility comments: MAX A to log roll; x2 trials as pt pulled legs back on bed in reaction to pain during first trial; does better getting OOB on pts L side  Transfers Overall transfer level: Needs assistance Equipment used: Rolling walker (2 wheeled) Transfers: Sit to/from Stand Sit to Stand: Mod assist         General transfer comment: unable to transfer this session d/t pain; MOD A to stand with RW; cues for hand placement; pt not wanting to put weight through RLE; able to take side steps up to [facility] with MAX verbal/ tactile cues for sequencing of task    Balance Overall balance assessment: Needs assistance Sitting-balance support: Feet supported;No upper extremity supported Sitting balance-Leahy Scale: Fair Sitting balance - Comments: sat EOB with close supervision   Standing balance support: Bilateral upper extremity supported Standing balance-Leahy Scale: Poor Standing balance comment: reliant on BUE support                           ADL either performed or assessed with clinical judgement   ADL Overall ADL's : Needs assistance/impaired                         Toilet Transfer: Moderate assistance;RW Toilet Transfer Details (indicate cue type and reason): MOD A to stand from EOB to pee         Functional mobility during ADLs: Moderate assistance;Rolling walker General ADL Comments: pt with increased  pain with movement; MOD A for sit >stand from EOB and to take side steps towards [facility]; declined further ADLs d/t pain     Vision Baseline Vision/History: Wears glasses Wears Glasses: At all times Patient Visual Report: No change from baseline Vision Assessment?: No apparent visual deficits Additional Comments: reports light sensitivity   Perception     Praxis      Cognition Arousal/Alertness: Awake/alert Behavior During Therapy: WFL for tasks assessed/performed Overall Cognitive Status: Impaired/Different from baseline Area of  Impairment: Orientation;Following commands;Attention;Problem solving                 Orientation Level: Disoriented to;Time Current Attention Level: Selective   Following Commands: Follows multi-step commands with increased time;Follows one step commands with increased time     Problem Solving: Slow processing;Requires verbal cues;Requires tactile cues General Comments: pt requires increased time overall, likely d/t pain, keeps eyes closed throughout session but reports d/t light sensitivity; axox3 this session        Exercises     Shoulder Instructions       General Comments HR MAX in 150s with functional mobility    Pertinent Vitals/ Pain       Pain Score: 5  Pain Location: R leg; all over back pain Pain Descriptors / Indicators: Operative site guarding;Grimacing;Discomfort;Moaning;Nagging Pain Intervention(s): Limited activity within patient's tolerance;Monitored during session;Repositioned  Home Living                                          Prior Functioning/Environment              Frequency  Min 2X/week        Progress Toward Goals  OT Goals(current goals can now be found in the care plan section)  Progress towards OT goals: Progressing toward goals  Acute Rehab OT Goals Patient Stated Goal: to get home and get pain better OT Goal Formulation: With patient Time For Goal Achievement: 07/16/19 Potential to Achieve Goals: Good  Plan Discharge plan remains appropriate    Co-evaluation                 AM-PAC OT "6 Clicks" Daily Activity     Outcome Measure   Help from another person eating meals?: None Help from another person taking care of personal grooming?: A Little Help from another person toileting, which includes using toliet, bedpan, or urinal?: A Lot Help from another person bathing (including washing, rinsing, drying)?: A Lot Help from another person to put on and taking off regular upper body clothing?: A  Little Help from another person to put on and taking off regular lower body clothing?: A Lot 6 Click Score: 16    End of Session Equipment Utilized During Treatment: Rolling walker  OT Visit Diagnosis: Other abnormalities of gait and mobility (R26.89);Pain;Muscle weakness (generalized) (M62.81) Pain - Right/Left: Right Pain - part of body: Leg   Activity Tolerance Patient limited by pain   Patient Left in bed;with call bell/phone within reach;with bed alarm set   Nurse Communication Mobility status;Other (comment)(BP WNL at end of session)        Time: 1139-1212 OT Time Calculation (min): 33 min  Charges: OT General Charges $OT Visit: 1 Visit OT Treatments $Therapeutic Activity: 23-37 mins [place], [place] [facility] [phone] [place] 07/06/2019, 12:17 PM

## 2019-07-06 NOTE — Progress Notes (Signed)
Patient remains out of restraints and is doing well. Talking to his wife on the phone at this time

## 2019-07-07 ENCOUNTER — Other Ambulatory Visit: Payer: Self-pay

## 2019-07-07 DIAGNOSIS — I739 Peripheral vascular disease, unspecified: Secondary | ICD-10-CM

## 2019-07-07 DIAGNOSIS — I480 Paroxysmal atrial fibrillation: Secondary | ICD-10-CM | POA: Diagnosis not present

## 2019-07-07 DIAGNOSIS — I2510 Atherosclerotic heart disease of native coronary artery without angina pectoris: Secondary | ICD-10-CM

## 2019-07-07 DIAGNOSIS — I10 Essential (primary) hypertension: Secondary | ICD-10-CM

## 2019-07-07 LAB — CBC
HCT: 23.6 % — ABNORMAL LOW (ref 39.0–52.0)
Hemoglobin: 7.9 g/dL — ABNORMAL LOW (ref 13.0–17.0)
MCH: 30.7 pg (ref 26.0–34.0)
MCHC: 33.5 g/dL (ref 30.0–36.0)
MCV: 91.8 fL (ref 80.0–100.0)
Platelets: 290 10*3/uL (ref 150–400)
RBC: 2.57 MIL/uL — ABNORMAL LOW (ref 4.22–5.81)
RDW: 14 % (ref 11.5–15.5)
WBC: 9.4 10*3/uL (ref 4.0–10.5)
nRBC: 0 % (ref 0.0–0.2)

## 2019-07-07 LAB — BASIC METABOLIC PANEL
Anion gap: 10 (ref 5–15)
BUN: 18 mg/dL (ref 8–23)
CO2: 19 mmol/L — ABNORMAL LOW (ref 22–32)
Calcium: 7.9 mg/dL — ABNORMAL LOW (ref 8.9–10.3)
Chloride: 111 mmol/L (ref 98–111)
Creatinine, Ser: 1.32 mg/dL — ABNORMAL HIGH (ref 0.61–1.24)
GFR calc Af Amer: >60 mL/min (ref >60)
GFR calc non Af Amer: 54 mL/min — ABNORMAL LOW (ref >60)
Glucose, Bld: 71 mg/dL (ref 70–99)
Potassium: 3.9 mmol/L (ref 3.5–5.1)
Sodium: 140 mmol/L (ref 135–145)

## 2019-07-07 LAB — URINALYSIS, ROUTINE W REFLEX MICROSCOPIC
Bacteria, UA: NONE SEEN
Bilirubin Urine: NEGATIVE
Glucose, UA: NEGATIVE mg/dL
Hgb urine dipstick: NEGATIVE
Ketones, ur: 20 mg/dL — AB
Leukocytes,Ua: NEGATIVE
Nitrite: NEGATIVE
Protein, ur: 30 mg/dL — AB
Specific Gravity, Urine: 1.021 (ref 1.005–1.030)
pH: 5 (ref 5.0–8.0)

## 2019-07-07 LAB — GLUCOSE, CAPILLARY
Glucose-Capillary: 87 mg/dL (ref 70–99)
Glucose-Capillary: 56 mg/dL — ABNORMAL LOW (ref 70–99)
Glucose-Capillary: 74 mg/dL (ref 70–99)
Glucose-Capillary: 74 mg/dL (ref 70–99)
Glucose-Capillary: 178 mg/dL — ABNORMAL HIGH (ref 70–99)

## 2019-07-07 LAB — HEPARIN LEVEL (UNFRACTIONATED)
Heparin Unfractionated: 0.16 IU/mL — ABNORMAL LOW (ref 0.30–0.70)
Heparin Unfractionated: 0.38 IU/mL (ref 0.30–0.70)

## 2019-07-07 MED ORDER — SODIUM CHLORIDE 0.9 % IV BOLUS
1000.0000 mL | Freq: Once | INTRAVENOUS | Status: AC
  Administered 2019-07-07: 1000 mL via INTRAVENOUS

## 2019-07-07 MED ORDER — HEPARIN (PORCINE) 25000 UT/250ML-% IV SOLN
1750.0000 [IU]/h | INTRAVENOUS | Status: DC
  Administered 2019-07-07: 1300 [IU]/h via INTRAVENOUS
  Administered 2019-07-08 – 2019-07-09 (×2): 1600 [IU]/h via INTRAVENOUS
  Administered 2019-07-10: 1750 [IU]/h via INTRAVENOUS
  Filled 2019-07-07 (×7): qty 250

## 2019-07-07 MED ORDER — DILTIAZEM HCL 25 MG/5ML IV SOLN
10.0000 mg | Freq: Once | INTRAVENOUS | Status: AC
  Administered 2019-07-07: 10 mg via INTRAVENOUS
  Filled 2019-07-07: qty 5

## 2019-07-07 NOTE — Plan of Care (Signed)
Continue to monitor

## 2019-07-07 NOTE — Progress Notes (Addendum)
Progress Note    07/07/2019 7:35 AM 6 Days Post-Op  Subjective:  Says he feels awful this morning.  Per RN, ace wrap off leg as pt states it was too tight.  minimal serosanguinous drainage on bandage when removed.   Afebrile HR 120's afib (140's afib in room) [id number] systolic overnight.  Documented 0000000 systolic [id number] RA  Vitals:   07/07/19 0530 07/07/19 0600  BP:  96/67  Pulse:  (!) 110  Resp: 15 15  Temp:  98.5 F (36.9 C)  SpO2: 99% 99%    Physical Exam: Cardiac:  irregular Lungs:  Non labored Extremities:  Brisk right PT doppler signal   CBC    Component Value Date/Time   WBC 9.4 07/07/2019 0330   RBC 2.57 (L) 07/07/2019 0330   HGB 7.9 (L) 07/07/2019 0330   HCT 23.6 (L) 07/07/2019 0330   HCT 31.9 (L) 12/17/2017 0338   PLT 290 07/07/2019 0330   MCV 91.8 07/07/2019 0330   MCH 30.7 07/07/2019 0330   MCHC 33.5 07/07/2019 0330   RDW 14.0 07/07/2019 0330   LYMPHSABS 3.3 12/22/2017 0433   MONOABS 1.1 (H) 12/22/2017 0433   EOSABS 0.4 12/22/2017 0433   BASOSABS 0.1 12/22/2017 0433    BMET    Component Value Date/Time   NA 138 07/04/2019 0801   K 4.1 07/04/2019 0801   CL 106 07/04/2019 0801   CO2 26 07/04/2019 0801   GLUCOSE 79 07/04/2019 0801   BUN 24 (H) 07/04/2019 0801   CREATININE 1.32 (H) 07/04/2019 0801   CALCIUM 7.9 (L) 07/04/2019 0801   GFRNONAA 54 (L) 07/04/2019 0801   GFRAA >60 07/04/2019 0801    INR    Component Value Date/Time   INR 1.0 06/27/2019 1225     Intake/Output Summary (Last 24 hours) at 07/07/2019 0735 Last data filed at 07/07/2019 0135 Gross per 24 hour  Intake 480 ml  Output 600 ml  Net -120 ml     Assessment:  73 y.o. male is s/p:  R CFA endart, femoral to PTA composite bypass  6 Days Post-Op  Plan: -pt with brisk doppler signal right PT -pt's confusion improved over the past 24 hrs -pt with afib overnight-he did have a burst of tachycardia previously and EKG on 10/15 and 10/17 revealed NSR.  Afib  overnight and tachy in 140's now-stat EKG ordered.  He was hypotensive with systolic in [id number].  He received fluid bolus and cardizem 10mg  push earlier this am.  He has not been on sq heparin or heparin gtt due to increased venous bleeding during surgery in the RLE.  He has had minimal serosanguinous drainage from the lower incision.  Will start heparin gtt without bolus as he does not have evidence of bleeding at this point. Consult cardiology.  It is reported that he has chronic afib, but I cannot find documentation of this. Prior to admission, he was on plavix and asa.  The plavix has not been restarted due to the increased venous bleeding in the RLE during surgery.  -acute blood loss anemia-hgb down at 7.9 but essentially stable from 3 days ago (8.2).  -check labs today and in am   Leontine Locket, PA-C [facility] [phone] 07/07/2019 7:35 AM  I agree with the above.  Have seen and evaluated patient.  Overnight he had issues with atrial fibrillation requiring Lopressor, diltiazem and fluid.  He is currently being started on IV heparin today.  Cardiology has been consulted.  Electrolytes for this  morning are pending.  UA is pending  Annamarie Major

## 2019-07-07 NOTE — Progress Notes (Signed)
   07/06/19 2337  Vitals  Temp 98 F (36.7 C)  Temp Source Oral  BP (!) 86/57  MAP (mmHg) 65  BP Location Left Arm  BP Method Automatic  Patient Position (if appropriate) Lying  Pulse Rate (!) 131  Pulse Rate Source Monitor  ECG Heart Rate (!) 133  Cardiac Rhythm Atrial fibrillation  Resp 16  Spoke to Dr Trula Slade ordered two give both previous bolus if not successful in  Bringing HR down give 10 mg Cardizem push will continue to monitor

## 2019-07-07 NOTE — Progress Notes (Signed)
ANTICOAGULATION CONSULT NOTE - [place] for heparin Indication: atrial fibrillation  Allergies  Allergen Reactions  . Betadine [Povidone Iodine] Swelling    Facial swelling  . Fish Allergy Swelling    Facial swelling. Many years ago.  . Iodine Swelling    Facial swelling  . Shellfish Allergy Swelling    Facial swelling  . Sulfamethoxazole-Trimethoprim Swelling    Facial swelling  . Gabapentin Other (See Comments)    Unknown reaction  . Chlorhexidine   . Trimethoprim Swelling    Facial swelling - Doesn't think he ever took this by itself.  . Tramadol Other (See Comments)    Makes him crazy    Patient Measurements: Height: 6' (182.9 cm) Weight: 213 lb 3 oz (96.7 kg) IBW/kg (Calculated) : 77.6 Heparin Dosing Weight: 96.7 kg  Vital Signs: Temp: 98.2 F (36.8 C) (10/18 1925) Temp Source: Oral (10/18 1925) BP: 122/57 (10/18 1925) Pulse Rate: 110 (10/18 1925)  Labs: Recent Labs    07/07/19 0330 07/07/19 1048 07/07/19 1501 07/07/19 2229  HGB 7.9*  --   --   --   HCT 23.6*  --   --   --   PLT 290  --   --   --   HEPARINUNFRC  --   --  0.16* 0.38  CREATININE  --  1.32*  --   --     Estimated Creatinine Clearance: 61.9 mL/min (A) (by C-G formula based on SCr of 1.32 mg/dL (H)).  Assessment: 44 yom with a fib. Was previously not on anticoagulation d/t bleeding during surgery on RLE which is now resolved. No AC PTA, just plavix and ASA.   H&H is low and has trended down since CBC on 10/12. H&H today is 7.9/23.6, plts are wnl. Will not bolus per consult d/t recent bleeding, monitor signs of bleeding carefully on hep gtt.   Initial heparin level is therapeutic.  Goal of Therapy:  Heparin level 0.3-0.7 units/ml Monitor platelets by anticoagulation protocol: Yes   Plan:  -Continue heparin at 1600 units/hr Daily HL and CBC  Thanks for allowing pharmacy to be a part of this patient's care.  Excell Seltzer, PharmD Clinical  Pharmacist 07/07/2019

## 2019-07-07 NOTE — Progress Notes (Signed)
Patient assisted to standing position at bed side with walker and voided 400 ml urine at this time. Patient was unable to void lying down. Patient more comfortable at this time.

## 2019-07-07 NOTE — Consult Note (Addendum)
Cardiology Consultation:   Patient ID: Dennis [name] MRN: [id number]; DOB: 11-May-1946  Admit date: 07/01/2019 Date of Consult: 07/07/2019  Primary Care Provider: Glenda Chroman, MD Primary Cardiologist: Carlyle Dolly, MD  Primary Electrophysiologist:  None    Patient Profile:   Dennis [name] is a 73 y.o. male with a hx of CAD, (s/p prior stenting of RCA and OM2 in 2006, s/p STEMI in 10/2017 with stenting of mid-RCA and PDA with DES x2), post-operative atrial fibrillation (occurring in 11/2017 following hip surgery), HTN, HLD, and Type 2 DM  who is being seen today for the evaluation of atrial fib at the request of Dr. Oneida Alar.  History of Present Illness:   Dennis [name] with above hx including s/p prior stenting of RCA and OM2 in 2006, s/p STEMI in 10/2017 with stenting of mid-RCA and PDA with DES x2), and hx of PAF on eliquis though this had been stopped- it was unclear when and why stopped,  On ASA and plavix and BB.  Plan had been if further episodes of A fib then resume Eliquis.  He more recently has PAD and non healing wound of Rt foot.   He has developed some confusion with neg CT head.    He was given pre-op clearance in Sept for Rt femoral to posterior tibial artery bypass after having neg nuc study.  He did have burst on NSVT after injection.  Also PVCs.   Pt admitted and had Right common femoral endarterectomy with profundoplasty, vein patch right common femoral artery, right common femoral to posterior tibial artery bypass with composite PTFE (propaten 6 mm), reversed right greater saphenous vein on 07/01/19.  He has been progressing and ambulating.     Had some confusion on the 17th with neg head CT but seems to have cleared. Last evening he went into a fib in the 130s and 5 mg IV lopressor was given.   He was then given IV dilt bolus then drop in BP to [id number] systolic, 1 L NS given.    BP currently 96/57 to 102/68 and P 122 to 106 a fib.   Hgb today 7.9 and HCT 23.6, WBC 9.4  plts 290.  Yesterday Na 138, K+ 4.1, Cr 1.32  Pt had not been on Heparin due to increased venous bleeding during surgery.  Heparin now started IV, without bolus.    EKG:  The EKG was personally reviewed and demonstrates:  Today a fib with RVR and PVCs, but now pt has converted to SR.  With PACs.     Telemetry:  Telemetry was personally reviewed and demonstrates:  SR with PVCs on the 15th appears to be ST   Now complaining of not being able to void.  Very uncomfortable. RN is aware.  No chest pain.  No SOB   Heart Pathway Score:     Past Medical History:  Diagnosis Date  . Arthritis   . CAD (coronary artery disease)    a. s/p prior stenting of RCA and OM2 in 2006 b. s/p STEMI in 10/2017 with stenting of mid-RCA and PDA with DES x2  . Chronic pain   . Diabetes mellitus ([place])   . Hyperlipidemia   . Hypertension   . STEMI (ST elevation myocardial infarction) ([place])   . Tobacco use     Past Surgical History:  Procedure Laterality Date  . ABDOMINAL AORTOGRAM W/LOWER EXTREMITY Bilateral 06/07/2019   Procedure: ABDOMINAL AORTOGRAM W/LOWER EXTREMITY;  Surgeon: Elam Dutch, MD;  Location: [facility]  INVASIVE CV LAB;  Service: Cardiovascular;  Laterality: Bilateral;  . CORONARY STENT INTERVENTION N/A 11/13/2017   Procedure: CORONARY STENT INTERVENTION;  Surgeon: Martinique, Peter M, MD;  Location: [place] CV LAB;  Service: Cardiovascular;  Laterality: N/A;  . CORONARY/GRAFT ACUTE MI REVASCULARIZATION N/A 11/13/2017   Procedure: Coronary/Graft Acute MI Revascularization;  Surgeon: Martinique, Peter M, MD;  Location: [place] CV LAB;  Service: Cardiovascular;  Laterality: N/A;  . FEMORAL-TIBIAL BYPASS GRAFT Right 07/01/2019   Procedure: Right  FEMORAL- POSTERIOR TIBIAL ARTERY BYPASS GRAFT with Composite PTFE and Reversed Saphenous Vein, RIGHT Common FEMORAL ENDARTERECTOMY with Profundoplasty;  Surgeon: Elam Dutch, MD;  Location: [place];  Service: Vascular;  Laterality: Right;  . LEFT HEART CATH  AND CORONARY ANGIOGRAPHY N/A 11/13/2017   Procedure: LEFT HEART CATH AND CORONARY ANGIOGRAPHY;  Surgeon: Martinique, Peter M, MD;  Location: [place] CV LAB;  Service: Cardiovascular;  Laterality: N/A;  . TOTAL HIP ARTHROPLASTY Right 12/18/2017   Procedure: TOTAL HIP ARTHROPLASTY ANTERIOR APPROACH;  Surgeon: Rod Can, MD;  Location: [place];  Service: Orthopedics;  Laterality: Right;     Home Medications:  Prior to Admission medications   Medication Sig Start Date End Date Taking? Authorizing Provider  aspirin EC 81 MG tablet Take 81 mg by mouth daily.   Yes [provider]  clopidogrel (PLAVIX) 75 MG tablet Take 75 mg by mouth daily.   Yes [provider]  lisinopril (PRINIVIL,ZESTRIL) 5 MG tablet Take 5 mg by mouth daily.   Yes [provider]  Menthol, Topical Analgesic, (ICY HOT BACK EX) Apply 1 application topically every 6 (six) hours as needed (pain).   Yes [provider]  metoprolol tartrate (LOPRESSOR) 25 MG tablet Take 1 tablet by mouth twice a day Patient taking differently: Take 25 mg by mouth 2 (two) times daily.  03/25/19  Yes BranchAlphonse Guild, MD  rosuvastatin (CRESTOR) 20 MG tablet Take 1 tablet (20 mg total) by mouth daily. 05/22/19  Yes Fields, Jessy Oto, MD  silver sulfADIAZINE (SILVADENE) 1 % cream Apply 1 application topically daily.   Yes [provider]  TRESIBA FLEXTOUCH 200 UNIT/ML SOPN Inject 28 Units into the skin 2 (two) times daily.  07/12/18  Yes [provider]  vitamin B-12 1000 MCG tablet Take 1 tablet (1,000 mcg total) by mouth daily. 12/26/17  Yes Hongalgi, Lenis Dickinson, MD  acetaminophen (TYLENOL) 500 MG tablet Take 1,000 mg by mouth every 6 (six) hours as needed for mild pain or headache.    [provider]  diazepam (VALIUM) 10 MG tablet Take 1 tablet by mouth at bedtime as needed for sleep.  06/14/18   [provider]  furosemide (LASIX) 20 MG tablet Take 20 mg by mouth daily as needed for  fluid (swelling).     [provider]  HYDROcodone-acetaminophen (NORCO/VICODIN) 5-325 MG tablet Take 1 tablet by mouth every 6 (six) hours as needed for moderate pain or severe pain. 12/25/17   Hongalgi, Lenis Dickinson, MD  linaclotide (LINZESS) 290 MCG CAPS capsule Take 290 mcg by mouth daily as needed (constipation).     [provider]  metFORMIN (GLUCOPHAGE) 850 MG tablet Take 850 mg by mouth daily with breakfast. 12/08/17   [provider]  nitroGLYCERIN (NITROSTAT) 0.4 MG SL tablet Place 1 tablet (0.4 mg total) under the tongue every 5 (five) minutes x 3 doses as needed for chest pain. 11/15/17   Cheryln Manly, NP    Inpatient Medications: Scheduled  Meds: . aspirin EC  81 mg Oral Daily  . docusate sodium  100 mg Oral Daily  . insulin aspart  0-15 Units Subcutaneous TID WC  . insulin glargine  28 Units Subcutaneous BID  . lisinopril  5 mg Oral Daily  . metFORMIN  850 mg Oral Q breakfast  . metoprolol tartrate  25 mg Oral BID  . pantoprazole  40 mg Oral Daily  . rosuvastatin  20 mg Oral Daily  . silver sulfADIAZINE  1 application Topical Daily  . cyanocobalamin  1,000 mcg Oral Daily   Continuous Infusions: . heparin 1,300 Units/hr (07/07/19 0818)  . magnesium sulfate bolus IVPB     PRN Meds: acetaminophen **OR** acetaminophen, alum & mag hydroxide-simeth, bisacodyl, diazepam, furosemide, guaiFENesin-dextromethorphan, hydrALAZINE, HYDROcodone-acetaminophen, labetalol, linaclotide, LORazepam, magnesium sulfate bolus IVPB, morphine injection, nitroGLYCERIN, ondansetron, phenol, polyethylene glycol, potassium chloride  Allergies:    Allergies  Allergen Reactions  . Betadine [Povidone Iodine] Swelling    Facial swelling  . Fish Allergy Swelling    Facial swelling. Many years ago.  . Iodine Swelling    Facial swelling  . Shellfish Allergy Swelling    Facial swelling  . Sulfamethoxazole-Trimethoprim Swelling    Facial swelling  . Gabapentin Other (See  Comments)    Unknown reaction  . Chlorhexidine   . Trimethoprim Swelling    Facial swelling - Doesn't think he ever took this by itself.  . Tramadol Other (See Comments)    Makes him crazy    Social History:   Social History   Socioeconomic History  . Marital status: Married    Spouse name: Not on file  . Number of children: Not on file  . Years of education: Not on file  . Highest education level: Not on file  Occupational History  . Not on file  Social Needs  . Financial resource strain: Not on file  . Food insecurity    Worry: Not on file    Inability: Not on file  . Transportation needs    Medical: Not on file    Non-medical: Not on file  Tobacco Use  . Smoking status: Former Smoker    Types: Cigarettes    Start date: 10/25/2017    Quit date: 11/15/2017    Years since quitting: 1.6  . Smokeless tobacco: Never Used  . Tobacco comment: 2-3 cigarettes per year.  Substance and Sexual Activity  . Alcohol use: No    Frequency: Never  . Drug use: No  . Sexual activity: Not on file  Lifestyle  . Physical activity    Days per week: Not on file    Minutes per session: Not on file  . Stress: Not on file  Relationships  . Social Herbalist on phone: Not on file    Gets together: Not on file    Attends religious service: Not on file    Active member of club or organization: Not on file    Attends meetings of clubs or organizations: Not on file    Relationship status: Not on file  . Intimate partner violence    Fear of current or ex partner: Not on file    Emotionally abused: Not on file    Physically abused: Not on file    Forced sexual activity: Not on file  Other Topics Concern  . Not on file  Social History Narrative  . Not on file    Family History:    Family History  Problem Relation  Age of Onset  . Hypertension Father   . Heart disease Father      ROS:  Please see the history of present illness.  General:no colds or fevers, no weight  changes Skin:no rashes or ulcers HEENT:no blurred vision, no congestion CV:see HPI PUL:see HPI GI:no diarrhea constipation or melena, no indigestion GU:no hematuria, no dysuria, complains of not being able to void MS:no joint pain, no claudication Neuro:no syncope, no lightheadedness Endo:+ diabetes, no thyroid disease  All other ROS reviewed and negative.     Physical Exam/Data:   Vitals:   07/07/19 0730 07/07/19 0800 07/07/19 0830 07/07/19 0900  BP:  (!) 96/57    Pulse:      Resp:      Temp:      TempSrc:      SpO2: 99% 98% 99% 99%  Weight:      Height:        Intake/Output Summary (Last 24 hours) at 07/07/2019 0923 Last data filed at 07/07/2019 0135 Gross per 24 hour  Intake 480 ml  Output 600 ml  Net -120 ml   Last 3 Weights 07/04/2019 07/01/2019 06/27/2019  Weight (lbs) 213 lb 3 oz 213 lb 3 oz 213 lb 4.8 oz  Weight (kg) 96.7 kg 96.7 kg 96.752 kg     Body mass index is 28.91 kg/m.  General:  Well nourished, well developed, complains of needing to pass urine. No other complaints HEENT: normal Lymph: no adenopathy Neck: no JVD Endocrine:  No thryomegaly Vascular: No carotid bruits; lower ext pulses with dopplers  Cardiac:  normal S1, S2; RRR; no murmur gallup rub or click Lungs:  clear to auscultation bilaterally, no wheezing, rhonchi or rales  Abd: soft, + lower abd tenderness, no hepatomegaly  Ext: no edema Musculoskeletal:  No deformities, BUE and BLE strength normal and equal Skin: warm and dry Incision is wrapped. Neuro:  Alert and oriented X 3 MAE follows commands no focal abnormalities noted Psych:  Normal affect    Relevant CV Studies: Cath 11/13/17   Mid RCA lesion is 100% stenosed.  Dist RCA lesion is 40% stenosed.  Prox RCA lesion is 50% stenosed.  Ost RPDA to RPDA lesion is 99% stenosed.  Prox LAD to Mid LAD lesion is 60% stenosed.  Ost 1st Diag lesion is 90% stenosed.  Ost 2nd Diag to 2nd Diag lesion is 85% stenosed.  Ost 2nd Mrg  to 2nd Mrg lesion is 40% stenosed.  Ost Cx to Prox Cx lesion is 40% stenosed.  Ost 1st Mrg to 1st Mrg lesion is 70% stenosed.  The left ventricular systolic function is normal.  LV end diastolic pressure is mildly elevated.  The left ventricular ejection fraction is 55-65% by visual estimate.  Post intervention, there is a 0% residual stenosis.  A drug-eluting stent was successfully placed using a STENT SYNERGY DES 2.25X28.  Post intervention, there is a 0% residual stenosis.  A drug-eluting stent was successfully placed using a STENT SYNERGY DES 3X20.   1. 3 vessel obstructive CAD.    - diffuse 60% proximal to mid LAD with severe disease in small diagonal branches.    - 70% OM1. Stent in OM 2 is diffusely diseased but patent    - 100% mid RCA and 99% PDA 2. Good LV function 3. Mildly elevated LVEDP 4. Successful stenting of the PDA and mid RCA with DES x 2. Diffusely diseased and heavily calcified RCA.  Plan: DAPT indefinitely. Aggressive risk factor modification and smoking cessation.  Diagnostic Dominance: Right  Intervention     Echo 11/14/17  Study Conclusions  - Left ventricle: The cavity size was normal. Systolic function was   normal. The estimated ejection fraction was in the range of 55%   to 60%. Possible mild hypokinesis of the basal-midinferior   myocardium. - Left atrium: The atrium was mildly dilated.  Wore event monitor but did not complete  Laboratory Data:  High Sensitivity Troponin:  No results for input(s): TROPONINIHS in the last 720 hours.   Chemistry Recent Labs  Lab 07/01/19 0841 07/02/19 0323 07/04/19 0801  NA 137 137 138  K 4.2 4.5 4.1  CL  --  103 106  CO2  --  23 26  GLUCOSE 141* 180* 79  BUN  --  19 24*  CREATININE  --  1.33* 1.32*  CALCIUM  --  8.2* 7.9*  GFRNONAA  --  53* 54*  GFRAA  --  >60 >60  ANIONGAP  --  11 6    No results for input(s): PROT, ALBUMIN, AST, ALT, ALKPHOS, BILITOT in the last 168 hours.  Hematology Recent Labs  Lab 07/02/19 0323 07/04/19 0801 07/07/19 0330  WBC 10.0 9.8 9.4  RBC 2.99* 2.66* 2.57*  HGB 9.2* 8.2* 7.9*  HCT 26.6* 24.1* 23.6*  MCV 89.0 90.6 91.8  MCH 30.8 30.8 30.7  MCHC 34.6 34.0 33.5  RDW 13.1 13.3 14.0  PLT 229 238 290   BNPNo results for input(s): BNP, PROBNP in the last 168 hours.  DDimer No results for input(s): DDIMER in the last 168 hours.   Radiology/Studies:  Ct Head Wo Contrast  Result Date: 07/05/2019 CLINICAL DATA:  73 year old male with acute altered mental status and confusion. EXAM: CT HEAD WITHOUT CONTRAST TECHNIQUE: Contiguous axial images were obtained from the base of the skull through the vertex without intravenous contrast. COMPARISON:  None. FINDINGS: Brain: No evidence of acute infarction, hemorrhage, hydrocephalus, extra-axial collection or mass lesion/mass effect. Atrophy, chronic small-vessel white matter ischemic changes and remote RIGHT caudate lacunar infarct noted. Vascular: Carotid atherosclerotic calcifications noted. Skull: Normal. Negative for fracture or focal lesion. Sinuses/Orbits: No acute finding. Other: None IMPRESSION: 1. No evidence of acute intracranial abnormality. 2. Atrophy, chronic small-vessel white matter ischemic changes and remote RIGHT caudate lacunar infarct. Electronically Signed   By: Margarette Canada M.D.   On: 07/05/2019 14:56   Vas [place] Burnard Bunting With/wo Tbi  Result Date: 07/06/2019 LOWER EXTREMITY DOPPLER STUDY Indications: Peripheral artery disease.  Vascular Interventions: 07/01/2019 - Right FEMORAL- POSTERIOR TIBIAL ARTERY                         BYPASS GRAFT with Composite PTFE and Reversed Saphenous                         Vein, RIGHT Common FEMORAL ENDARTERECTOMY with                         Profundoplasty (Right Leg Upper). Comparison Study: 05/22/2019 - R 0.35 L 0.71 Performing Technologist: Carlos Levering Rvt  Examination Guidelines: A complete evaluation includes at minimum, Doppler waveform signals and  systolic blood pressure reading at the level of bilateral brachial, anterior tibial, and posterior tibial arteries, when vessel segments are accessible. Bilateral testing is considered an integral part of a complete examination. Photoelectric Plethysmograph (PPG) waveforms and toe systolic pressure readings are included as required and additional duplex testing as  needed. Limited examinations for reoccurring indications may be performed as noted.  ABI Findings: +--------+------------------+-----+---------+--------+ Right   Rt Pressure (mmHg)IndexWaveform Comment  +--------+------------------+-----+---------+--------+ [id number]                    triphasic         +--------+------------------+-----+---------+--------+ PTA     157               1.02 biphasic          +--------+------------------+-----+---------+--------+ DP      142               0.92 biphasic          +--------+------------------+-----+---------+--------+ +--------+------------------+-----+---------+-------+ Left    Lt Pressure (mmHg)IndexWaveform Comment +--------+------------------+-----+---------+-------+ [id number]                    triphasic        +--------+------------------+-----+---------+-------+ PTA     112               0.73 triphasic        +--------+------------------+-----+---------+-------+ DP      132               0.86 triphasic        +--------+------------------+-----+---------+-------+ +-------+-----------+-----------+------------+------------+ ABI/TBIToday's ABIToday's TBIPrevious ABIPrevious TBI +-------+-----------+-----------+------------+------------+ Right  1.02                  0.35                     +-------+-----------+-----------+------------+------------+ Left   0.86                  0.71                     +-------+-----------+-----------+------------+------------+  Summary: Right: Resting right ankle-brachial index is within normal range. No  evidence of significant right lower extremity arterial disease. 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  *See table(s) above for measurements and observations.  Electronically signed by Harold Barban MD on 07/06/2019 at 12:45:38 AM.    Final     Assessment and Plan:   1. PAF, pt had episode with hip surgery 11/2017 and none documented since that time, he was off his eliquis and on ASA and plavix alone pre-op.  Went into A fib with RVR  at 2205 last night and after IV lopressor and IV dilt BP lower, now back in SR with PACs. is on IV heparin starting today - if this is only episode and lasted about 12 hours may hold home anticoagulation. If continues would need Eliquis as before 2. R CFA endart, femoral to PTA composite bypass post op day 6 3. CAD with hx of stents of RCA and OM2 in 2006, s/p STEMI in 10/2017 with stenting of mid-RCA and PDA with DES x2 no angina and neg nuc prior to admit. 4. HTN on lopressor 25 BID and lisinopril 5 mg daily with lower BP hold lisinopril and continue lopressor. 5. HLD followed by PCP and on crestor  6. DM per primary team.      For questions or updates, please contact [place] Please consult [email or web address] for contact info under     Signed, Cecilie Kicks, NP  07/07/2019 9:23 AM  I have seen and examined the patient along with Cecilie Kicks, NP.  I have reviewed the chart, notes and new data.  I agree with PA/NP's note.  Key new complaints: Very uncomfortable earlier due to urinary catheter, but seems to have settled down.  Denies angina or dyspnea. Key examination changes: Lying fully supine in bed, calm slow breathing. Key new findings / data: Had a roughly 12-hour episode of paroxysmal atrial fibrillation with rapid ventricular response from 10 PM to 10 AM.  Back in sinus rhythm now but still has very frequent PACs.  Hemoglobin 7.9, essentially unchanged from postop hemoglobin of 8.2, 3 days ago.   PLAN: Currently on aspirin and heparin. High embolic risk (CHADSvasc 4 - age, HTN, DM, PAD/CAD), second episode of atrial fibrillation in the last several months.  Ideally he will start back on Eliquis, acknowledging the increased risk of GI bleeding with his previous history, Long-term, best option is probably clopidogrel (rather than aspirin) plus Eliquis. On beta-blockers for rate control, we can increase the dose if his atrial fibrillation recurs.  Hold lisinopril due to relatively low blood pressure.  Sanda Klein, MD, [place] [phone] 07/07/2019, 11:00 AM

## 2019-07-07 NOTE — Progress Notes (Signed)
Hypoglycemic Event  CBG: 56  Treatment: Patient given 2 cups of apple juice with peanut butter and crackers.  Symptoms:No symptoms   Follow-up CBG:  Time: 1252 CBG Result:74  Possible Reasons for Event: Inadequate meal intake   Comments/MD notified: Leontine Locket, PA, new orders received.     [place]

## 2019-07-07 NOTE — Progress Notes (Addendum)
ANTICOAGULATION CONSULT NOTE - [facility] for heparin Indication: atrial fibrillation  Allergies  Allergen Reactions  . Betadine [Povidone Iodine] Swelling    Facial swelling  . Fish Allergy Swelling    Facial swelling. Many years ago.  . Iodine Swelling    Facial swelling  . Shellfish Allergy Swelling    Facial swelling  . Sulfamethoxazole-Trimethoprim Swelling    Facial swelling  . Gabapentin Other (See Comments)    Unknown reaction  . Chlorhexidine   . Trimethoprim Swelling    Facial swelling - Doesn't think he ever took this by itself.  . Tramadol Other (See Comments)    Makes him crazy    Patient Measurements: Height: 6' (182.9 cm) Weight: 213 lb 3 oz (96.7 kg) IBW/kg (Calculated) : 77.6 Heparin Dosing Weight: 96.7 kg  Vital Signs: Temp: 97.8 F (36.6 C) (10/18 1511) Temp Source: Oral (10/18 1511) BP: 123/60 (10/18 1511) Pulse Rate: 110 (10/18 0600)  Labs: Recent Labs    07/07/19 0330 07/07/19 1048 07/07/19 1501  HGB 7.9*  --   --   HCT 23.6*  --   --   PLT 290  --   --   HEPARINUNFRC  --   --  0.16*  CREATININE  --  1.32*  --     Estimated Creatinine Clearance: 61.9 mL/min (A) (by C-G formula based on SCr of 1.32 mg/dL (H)).   Medical History: Past Medical History:  Diagnosis Date  . Arthritis   . CAD (coronary artery disease)    a. s/p prior stenting of RCA and OM2 in 2006 b. s/p STEMI in 10/2017 with stenting of mid-RCA and PDA with DES x2  . Chronic pain   . Diabetes mellitus ([place])   . Hyperlipidemia   . Hypertension   . STEMI (ST elevation myocardial infarction) ([place])   . Tobacco use     Medications:  Medications Prior to Admission  Medication Sig Dispense Refill Last Dose  . aspirin EC 81 MG tablet Take 81 mg by mouth daily.   06/30/2019 at Unknown time  . clopidogrel (PLAVIX) 75 MG tablet Take 75 mg by mouth daily.   06/25/2019 at Unknown time  . lisinopril (PRINIVIL,ZESTRIL) 5 MG tablet Take 5 mg by mouth  daily.   06/30/2019 at Unknown time  . Menthol, Topical Analgesic, (ICY HOT BACK EX) Apply 1 application topically every 6 (six) hours as needed (pain).   06/30/2019 at Unknown time  . metoprolol tartrate (LOPRESSOR) 25 MG tablet Take 1 tablet by mouth twice a day (Patient taking differently: Take 25 mg by mouth 2 (two) times daily. ) 180 tablet 2 07/01/2019 at 0400  . rosuvastatin (CRESTOR) 20 MG tablet Take 1 tablet (20 mg total) by mouth daily. 30 tablet 12 07/01/2019 at 0400  . silver sulfADIAZINE (SILVADENE) 1 % cream Apply 1 application topically daily.   07/01/2019 at Unknown time  . TRESIBA FLEXTOUCH 200 UNIT/ML SOPN Inject 28 Units into the skin 2 (two) times daily.    07/01/2019 at 0400  . vitamin B-12 1000 MCG tablet Take 1 tablet (1,000 mcg total) by mouth daily.   Past Week at Unknown time  . acetaminophen (TYLENOL) 500 MG tablet Take 1,000 mg by mouth every 6 (six) hours as needed for mild pain or headache.   06/29/2019  . diazepam (VALIUM) 10 MG tablet Take 1 tablet by mouth at bedtime as needed for sleep.    06/29/2019  . furosemide (LASIX) 20 MG tablet Take  20 mg by mouth daily as needed for fluid (swelling).    More than a month at Unknown time  . HYDROcodone-acetaminophen (NORCO/VICODIN) 5-325 MG tablet Take 1 tablet by mouth every 6 (six) hours as needed for moderate pain or severe pain. 15 tablet 0 06/29/2019  . linaclotide (LINZESS) 290 MCG CAPS capsule Take 290 mcg by mouth daily as needed (constipation).    More than a month at Unknown time  . metFORMIN (GLUCOPHAGE) 850 MG tablet Take 850 mg by mouth daily with breakfast.   Unknown at Unknown time  . nitroGLYCERIN (NITROSTAT) 0.4 MG SL tablet Place 1 tablet (0.4 mg total) under the tongue every 5 (five) minutes x 3 doses as needed for chest pain. 25 tablet 2 More than a month at Unknown time   Infusions:  . heparin 1,300 Units/hr (07/07/19 0818)  . magnesium sulfate bolus IVPB      Assessment: 73 yom with a fib. Was  previously not on anticoagulation d/t bleeding during surgery on RLE which is now resolved. No AC PTA, just plavix and ASA.   H&H is low and has trended down since CBC on 10/12. H&H today is 7.9/23.6, plts are wnl. Will not bolus per consult d/t recent bleeding, monitor signs of bleeding carefully on hep gtt.   Initial heparin level is low at 0.16, avoiding boluses due to recent surgery per [facility] request. No bleeding issues since starting heparin per RN.  Goal of Therapy:  Heparin level 0.3-0.7 units/ml Monitor platelets by anticoagulation protocol: Yes   Plan:  -Increase heparin to 1600 units/hr -Recheck heparin level in [place], PharmD, BCPS Clinical Pharmacist [phone] Please check AMION for all [place] numbers 07/07/2019

## 2019-07-07 NOTE — Progress Notes (Signed)
   07/07/19 0055  Vitals  BP (!) 94/57  MAP (mmHg) 69  ECG Heart Rate (!) 123  Resp 16   !0 mg Cardizem push given per md order pt denies palpitations or any expressed distress

## 2019-07-07 NOTE — Progress Notes (Signed)
ANTICOAGULATION CONSULT NOTE - Initial Consult  Pharmacy Consult for heparin Indication: atrial fibrillation  Allergies  Allergen Reactions  . Betadine [Povidone Iodine] Swelling    Facial swelling  . Fish Allergy Swelling    Facial swelling. Many years ago.  . Iodine Swelling    Facial swelling  . Shellfish Allergy Swelling    Facial swelling  . Sulfamethoxazole-Trimethoprim Swelling    Facial swelling  . Gabapentin Other (See Comments)    Unknown reaction  . Chlorhexidine   . Trimethoprim Swelling    Facial swelling - Doesn't think he ever took this by itself.  . Tramadol Other (See Comments)    Makes him crazy    Patient Measurements: Height: 6' (182.9 cm) Weight: 213 lb 3 oz (96.7 kg) IBW/kg (Calculated) : 77.6 Heparin Dosing Weight: 96.7 kg  Vital Signs: Temp: 98.5 F (36.9 C) (10/18 0600) Temp Source: Oral (10/18 0600) BP: 96/67 (10/18 0600) Pulse Rate: 110 (10/18 0600)  Labs: Recent Labs    07/04/19 0801 07/07/19 0330  HGB 8.2* 7.9*  HCT 24.1* 23.6*  PLT 238 290  CREATININE 1.32*  --     Estimated Creatinine Clearance: 61.9 mL/min (A) (by C-G formula based on SCr of 1.32 mg/dL (H)).   Medical History: Past Medical History:  Diagnosis Date  . Arthritis   . CAD (coronary artery disease)    a. s/p prior stenting of RCA and OM2 in 2006 b. s/p STEMI in 10/2017 with stenting of mid-RCA and PDA with DES x2  . Chronic pain   . Diabetes mellitus ([place])   . Hyperlipidemia   . Hypertension   . STEMI (ST elevation myocardial infarction) ([place])   . Tobacco use     Medications:  Medications Prior to Admission  Medication Sig Dispense Refill Last Dose  . aspirin EC 81 MG tablet Take 81 mg by mouth daily.   06/30/2019 at Unknown time  . clopidogrel (PLAVIX) 75 MG tablet Take 75 mg by mouth daily.   06/25/2019 at Unknown time  . lisinopril (PRINIVIL,ZESTRIL) 5 MG tablet Take 5 mg by mouth daily.   06/30/2019 at Unknown time  . Menthol, Topical Analgesic,  (ICY HOT BACK EX) Apply 1 application topically every 6 (six) hours as needed (pain).   06/30/2019 at Unknown time  . metoprolol tartrate (LOPRESSOR) 25 MG tablet Take 1 tablet by mouth twice a day (Patient taking differently: Take 25 mg by mouth 2 (two) times daily. ) 180 tablet 2 07/01/2019 at 0400  . rosuvastatin (CRESTOR) 20 MG tablet Take 1 tablet (20 mg total) by mouth daily. 30 tablet 12 07/01/2019 at 0400  . silver sulfADIAZINE (SILVADENE) 1 % cream Apply 1 application topically daily.   07/01/2019 at Unknown time  . TRESIBA FLEXTOUCH 200 UNIT/ML SOPN Inject 28 Units into the skin 2 (two) times daily.    07/01/2019 at 0400  . vitamin B-12 1000 MCG tablet Take 1 tablet (1,000 mcg total) by mouth daily.   Past Week at Unknown time  . acetaminophen (TYLENOL) 500 MG tablet Take 1,000 mg by mouth every 6 (six) hours as needed for mild pain or headache.   06/29/2019  . diazepam (VALIUM) 10 MG tablet Take 1 tablet by mouth at bedtime as needed for sleep.    06/29/2019  . furosemide (LASIX) 20 MG tablet Take 20 mg by mouth daily as needed for fluid (swelling).    More than a month at Unknown time  . HYDROcodone-acetaminophen (NORCO/VICODIN) 5-325 MG tablet Take 1  tablet by mouth every 6 (six) hours as needed for moderate pain or severe pain. 15 tablet 0 06/29/2019  . linaclotide (LINZESS) 290 MCG CAPS capsule Take 290 mcg by mouth daily as needed (constipation).    More than a month at Unknown time  . metFORMIN (GLUCOPHAGE) 850 MG tablet Take 850 mg by mouth daily with breakfast.   Unknown at Unknown time  . nitroGLYCERIN (NITROSTAT) 0.4 MG SL tablet Place 1 tablet (0.4 mg total) under the tongue every 5 (five) minutes x 3 doses as needed for chest pain. 25 tablet 2 More than a month at Unknown time   Infusions:  . magnesium sulfate bolus IVPB      Assessment: 73 yom with a fib. Was previously not on anticoagulation d/t bleeding during surgery on RLE which is now resolved. No AC PTA, just plavix  and ASA.   H&H is low and has trended down since CBC on 10/12. H&H today is 7.9/23.6, plts are wnl.   Will not bolus per consult d/t recent bleeding, monitor signs of bleeding carefully on hep gtt.     Goal of Therapy:  Heparin level 0.3-0.7 units/ml Monitor platelets by anticoagulation protocol: Yes   Plan:  NO bolus  Start heparin infusion at 1300 units/hr Check anti-Xa level in 6 hours and daily while on heparin Continue to monitor H&H and platelets Monitor signs of bleeding   Thank you,   Eddie Candle, PharmD PGY-1 Pharmacy Resident   Please check amion for clinical pharmacist contact number

## 2019-07-07 NOTE — Progress Notes (Signed)
Pt's BP 70's/50's checked multiple times in both arms, Alert and oriented HR 90-120's improved from earlier. Dr Trula Slade paged 1 liter normal saline ordered as well as stat CBC

## 2019-07-08 DIAGNOSIS — I4891 Unspecified atrial fibrillation: Secondary | ICD-10-CM

## 2019-07-08 LAB — CBC
HCT: 25.5 % — ABNORMAL LOW (ref 39.0–52.0)
Hemoglobin: 8.6 g/dL — ABNORMAL LOW (ref 13.0–17.0)
MCH: 30.5 pg (ref 26.0–34.0)
MCHC: 33.7 g/dL (ref 30.0–36.0)
MCV: 90.4 fL (ref 80.0–100.0)
Platelets: 344 10*3/uL (ref 150–400)
RBC: 2.82 MIL/uL — ABNORMAL LOW (ref 4.22–5.81)
RDW: 14 % (ref 11.5–15.5)
WBC: 9.7 10*3/uL (ref 4.0–10.5)
nRBC: 0 % (ref 0.0–0.2)

## 2019-07-08 LAB — HEPARIN LEVEL (UNFRACTIONATED): Heparin Unfractionated: 0.45 IU/mL (ref 0.30–0.70)

## 2019-07-08 LAB — GLUCOSE, CAPILLARY
Glucose-Capillary: 158 mg/dL — ABNORMAL HIGH (ref 70–99)
Glucose-Capillary: 91 mg/dL (ref 70–99)
Glucose-Capillary: 127 mg/dL — ABNORMAL HIGH (ref 70–99)
Glucose-Capillary: 151 mg/dL — ABNORMAL HIGH (ref 70–99)

## 2019-07-08 LAB — BASIC METABOLIC PANEL
Anion gap: 7 (ref 5–15)
BUN: 14 mg/dL (ref 8–23)
CO2: 23 mmol/L (ref 22–32)
Calcium: 8.1 mg/dL — ABNORMAL LOW (ref 8.9–10.3)
Chloride: 107 mmol/L (ref 98–111)
Creatinine, Ser: 1.36 mg/dL — ABNORMAL HIGH (ref 0.61–1.24)
GFR calc Af Amer: >60 mL/min (ref >60)
GFR calc non Af Amer: 52 mL/min — ABNORMAL LOW (ref >60)
Glucose, Bld: 94 mg/dL (ref 70–99)
Potassium: 3.8 mmol/L (ref 3.5–5.1)
Sodium: 137 mmol/L (ref 135–145)

## 2019-07-08 MED ORDER — HALOPERIDOL LACTATE 5 MG/ML IJ SOLN
5.0000 mg | Freq: Four times a day (QID) | INTRAMUSCULAR | Status: DC | PRN
  Administered 2019-07-09: 5 mg via INTRAVENOUS
  Filled 2019-07-08: qty 1

## 2019-07-08 MED ORDER — HALOPERIDOL LACTATE 5 MG/ML IJ SOLN
INTRAMUSCULAR | Status: AC
  Filled 2019-07-08: qty 1

## 2019-07-08 MED ORDER — HALOPERIDOL LACTATE 5 MG/ML IJ SOLN
2.0000 mg | Freq: Once | INTRAMUSCULAR | Status: AC
  Administered 2019-07-08: 20:00:00 2 mg via INTRAVENOUS

## 2019-07-08 NOTE — Progress Notes (Signed)
Pt has been more confused this shift says he is at home continues to jump out of the bed, angry that we have kidnapped him. Pt reorients briefly but the quickly;y returns to confusion and paranoia

## 2019-07-08 NOTE — Progress Notes (Signed)
Physical Therapy Treatment Patient Details Name: Dennis [name] MRN: [id number] DOB: 07-Mar-1946 Today's Date: 07/08/2019    History of Present Illness Patient is a 73 y/o male who presents with BLE pain with nonhealing wounds on right foot, s/p Rt CFA endarterectomy fem to PTA composite bypass 10/12. PMH includes chronic pain, CAD, s/p PCI, DM, HTN, STEMI, Rt THA.    PT Comments    Pt progressing well with mobility. Pt/spouse declining SNF. With improvement in mobility, discharge recommendation updated to HHPT. Pt required min assist transfers and min assist ambulation 130' with RW. Mild confusion noted but pt cooperative.  Pt in recliner with feet elevated at end of session.   Follow Up Recommendations  Home health PT;Supervision/Assistance - 24 hour     Equipment Recommendations  None recommended by PT    Recommendations for Other Services       Precautions / Restrictions Precautions Precautions: Fall;Back Precaution Comments: Back precautions for comfort due to severe chronic back pain Restrictions Other Position/Activity Restrictions: pts right leg has been tight for years - he has walked on toes for years    Mobility  Bed Mobility               General bed mobility comments: Pt received sitting EOB. RN present in room.  Transfers Overall transfer level: Needs assistance Equipment used: Rolling walker (2 wheeled) Transfers: Sit to/from Stand   Stand pivot transfers: Min assist;+2 safety/equipment       General transfer comment: cues for hand placement. Assist to power up.  Ambulation/Gait Ambulation/Gait assistance: Min assist;+2 safety/equipment Gait Distance (Feet): 130 Feet Assistive device: Rolling walker (2 wheeled) Gait Pattern/deviations: Step-to pattern;Decreased stride length;Decreased stance time - right Gait velocity: decreased Gait velocity interpretation: <1.31 ft/sec, indicative of household ambulator General Gait Details: Pt up on  toes/no heel contact RLE. +2 for safety/chair follow but pt did not need to sit   Stairs             Wheelchair Mobility    Modified Rankin (Stroke Patients Only)       Balance Overall balance assessment: Needs assistance Sitting-balance support: Feet supported;No upper extremity supported Sitting balance-Leahy Scale: Fair Sitting balance - Comments: sat EOB supervision. Unable to accept challenges     Standing balance-Leahy Scale: Poor Standing balance comment: reliant on BUE support                            Cognition Arousal/Alertness: Awake/alert Behavior During Therapy: WFL for tasks assessed/performed;Flat affect Overall Cognitive Status: Impaired/Different from baseline Area of Impairment: Orientation;Following commands;Attention;Problem solving                 Orientation Level: Disoriented to;Time;Place Current Attention Level: Selective Memory: Decreased short-term memory Following Commands: Follows multi-step commands with increased time Safety/Judgement: Decreased awareness of safety Awareness: Emergent Problem Solving: Slow processing;Requires verbal cues General Comments: increased time to complete tasks      Exercises      General Comments        Pertinent Vitals/Pain Pain Assessment: 0-10 Pain Score: 10-Worst pain ever Pain Location: RLE Pain Descriptors / Indicators: Grimacing;Guarding;Discomfort;Tightness Pain Intervention(s): Limited activity within patient's tolerance;Repositioned;Monitored during session    Home Living                      Prior Function            PT Goals (current goals can now be  found in the care plan section) Acute Rehab PT Goals Patient Stated Goal: decrease pain PT Goal Formulation: With patient Time For Goal Achievement: 07/16/19 Potential to Achieve Goals: Fair Progress towards PT goals: Progressing toward goals    Frequency    Min 3X/week      PT Plan Discharge  plan needs to be updated    Co-evaluation              AM-PAC PT "6 Clicks" Mobility   Outcome Measure  Help needed turning from your back to your side while in a flat bed without using bedrails?: A Little Help needed moving from lying on your back to sitting on the side of a flat bed without using bedrails?: A Lot Help needed moving to and from a bed to a chair (including a wheelchair)?: A Little Help needed standing up from a chair using your arms (e.g., wheelchair or bedside chair)?: A Little Help needed to walk in hospital room?: A Little Help needed climbing 3-5 steps with a railing? : A Lot 6 Click Score: 16    End of Session Equipment Utilized During Treatment: Gait belt Activity Tolerance: Patient tolerated treatment well Patient left: in chair;with call bell/phone within reach;with chair alarm set Nurse Communication: Mobility status PT Visit Diagnosis: Pain;Muscle weakness (generalized) (M62.81);Other abnormalities of gait and mobility (R26.89) Pain - Right/Left: Right Pain - part of body: Leg     Time: [id number] PT Time Calculation (min) (ACUTE ONLY): 16 min  Charges:  $Gait Training: 8-22 mins                     Lorrin Goodell, PT  Office # [phone] Pager [phone]    Lorriane Shire 07/08/2019, 12:25 PM

## 2019-07-08 NOTE — Progress Notes (Addendum)
  Progress Note    07/08/2019 7:25 AM 7 Days Post-Op  Subjective:  Says he's better  Afebrile HR 80's-100's NSR 0000000 systolic [id number] RA  Vitals:   07/07/19 2326 07/08/19 0249  BP: 108/62 120/67  Pulse: 95 90  Resp: 18 16  Temp: 97.6 F (36.4 C) 97.7 F (36.5 C)  SpO2: 98% 98%    Physical Exam: Cardiac:  regular Lungs:  Non labored Incisions:  All incisions look good.  Lower leg wrapped and left in place Extremities:  Brisk doppler signal right PT   CBC    Component Value Date/Time   WBC 9.4 07/07/2019 0330   RBC 2.57 (L) 07/07/2019 0330   HGB 7.9 (L) 07/07/2019 0330   HCT 23.6 (L) 07/07/2019 0330   HCT 31.9 (L) 12/17/2017 0338   PLT 290 07/07/2019 0330   MCV 91.8 07/07/2019 0330   MCH 30.7 07/07/2019 0330   MCHC 33.5 07/07/2019 0330   RDW 14.0 07/07/2019 0330   LYMPHSABS 3.3 12/22/2017 0433   MONOABS 1.1 (H) 12/22/2017 0433   EOSABS 0.4 12/22/2017 0433   BASOSABS 0.1 12/22/2017 0433    BMET    Component Value Date/Time   NA 140 07/07/2019 1048   K 3.9 07/07/2019 1048   CL 111 07/07/2019 1048   CO2 19 (L) 07/07/2019 1048   GLUCOSE 71 07/07/2019 1048   BUN 18 07/07/2019 1048   CREATININE 1.32 (H) 07/07/2019 1048   CALCIUM 7.9 (L) 07/07/2019 1048   GFRNONAA 54 (L) 07/07/2019 1048   GFRAA >60 07/07/2019 1048    INR    Component Value Date/Time   INR 1.0 06/27/2019 1225     Intake/Output Summary (Last 24 hours) at 07/08/2019 0725 Last data filed at 07/08/2019 0300 Gross per 24 hour  Intake 752.68 ml  Output 1000 ml  Net -247.32 ml     Assessment:  73 y.o. male is s/p:  R CFA endart, femoral to PTA composite bypass   7 Days Post-Op  Plan: -pt confused this am but better on my assessment -brisk doppler signals right PT -RN reports no further afib.  Appreciate cardiology help with this pt.  Pt on heparin.  No evidence of bleeding lower leg.  Pt would not let them draw labs this morning.     Leontine Locket, PA-C [facility] [phone] 07/08/2019 7:25 AM  Still confused with occasional agitation Right leg still with some edema in calf incisions healing ACE wrap for right leg to decrease edema Will start Eliquis on day of d/c heparin while in hospital  Ruta Hinds, MD [facility] [place] Office: [phone]

## 2019-07-08 NOTE — Care Management Important Message (Signed)
Important Message  Patient Details  Name: JAHMEZ STENGER MRN: [id number] Date of Birth: 1945-10-31   Medicare Important Message Given:  Yes     Shelda Altes 07/08/2019, 1:33 PM

## 2019-07-08 NOTE — Progress Notes (Signed)
Occupational Therapy Treatment Patient Details Name: ZOLAN UTTLEY MRN: [id number] DOB: 1946/01/15 Today's Date: 07/08/2019    History of present illness Patient is a 73 y/o male who presents with BLE pain with nonhealing wounds on right foot, s/p Rt CFA endarterectomy fem to PTA composite bypass 10/12. PMH includes chronic pain, CAD, s/p PCI, DM, HTN, STEMI, Rt THA.   OT comments  Patient progressing slowly.  Continues to be limited by pain and impaired balance.  Completing transfers with min assist using RW, cueing for safety and hand placement.  Pt highly frustrated by back pain, eager to dc home.  Will need 24/7 support. Per PT pt/spouse declining SNF.  Will follow acutely.    Follow Up Recommendations  Home health OT;Supervision/Assistance - 24 hour    Equipment Recommendations  None recommended by OT    Recommendations for Other Services      Precautions / Restrictions Precautions Precautions: Fall;Back Precaution Comments: Back precautions for comfort due to severe chronic back pain       Mobility Bed Mobility Overal bed mobility: Needs Assistance Bed Mobility: Rolling;Sidelying to Sit;Sit to Sidelying Rolling: Min assist Sidelying to sit: Mod assist;HOB elevated     Sit to sidelying: Min assist General bed mobility comments: patient supine in bed, transtioned to EOB with increased time and trunk support to ascend; returned to supine with support for LEs   Transfers Overall transfer level: Needs assistance Equipment used: Rolling walker (2 wheeled) Transfers: Sit to/from Stand Sit to Stand: Min assist         General transfer comment: cues for hand placement. Assist to power up.    Balance Overall balance assessment: Needs assistance Sitting-balance support: Feet supported;No upper extremity supported Sitting balance-Leahy Scale: Fair Sitting balance - Comments: EOB supervision   Standing balance support: Bilateral upper extremity supported;During  functional activity Standing balance-Leahy Scale: Poor Standing balance comment: reliant on BUE support                           ADL either performed or assessed with clinical judgement   ADL Overall ADL's : Needs assistance/impaired                         Toilet Transfer: Ambulation;RW;Minimal assistance Toilet Transfer Details (indicate cue type and reason): simulated in room            General ADL Comments: declined further engagement in ADls this session      Vision       Perception     Praxis      Cognition Arousal/Alertness: Awake/alert Behavior During Therapy: WFL for tasks assessed/performed;Flat affect Overall Cognitive Status: Impaired/Different from baseline Area of Impairment: Following commands;Attention;Problem solving;Awareness;Safety/judgement;Memory                   Current Attention Level: Selective Memory: Decreased short-term memory Following Commands: Follows multi-step commands with increased time Safety/Judgement: Decreased awareness of safety Awareness: Emergent Problem Solving: Slow processing;Requires verbal cues General Comments: increased time for processing and safety awareness         Exercises     Shoulder Instructions       General Comments HR max 106    Pertinent Vitals/ Pain       Pain Assessment: Faces Faces Pain Scale: Hurts whole lot Pain Location: back and R LE  Pain Descriptors / Indicators: Grimacing;Guarding;Discomfort;Tightness Pain Intervention(s): Limited activity within patient's tolerance;Monitored during session;Repositioned  Home Living                                          Prior Functioning/Environment              Frequency  Min 2X/week        Progress Toward Goals  OT Goals(current goals can now be found in the care plan section)  Progress towards OT goals: Progressing toward goals  Acute Rehab OT Goals Patient Stated Goal: decrease  pain OT Goal Formulation: With patient  Plan Discharge plan remains appropriate;Frequency remains appropriate    Co-evaluation                 AM-PAC OT "6 Clicks" Daily Activity     Outcome Measure   Help from another person eating meals?: None Help from another person taking care of personal grooming?: A Little Help from another person toileting, which includes using toliet, bedpan, or urinal?: A Little Help from another person bathing (including washing, rinsing, drying)?: A Lot Help from another person to put on and taking off regular upper body clothing?: A Little Help from another person to put on and taking off regular lower body clothing?: A Lot 6 Click Score: 17    End of Session Equipment Utilized During Treatment: Gait belt;Rolling walker  OT Visit Diagnosis: Other abnormalities of gait and mobility (R26.89);Pain;Muscle weakness (generalized) (M62.81) Pain - Right/Left: Right Pain - part of body: Leg   Activity Tolerance Patient limited by pain   Patient Left in bed;with call bell/phone within reach;with bed alarm set   Nurse Communication Mobility status        Time: [id number] OT Time Calculation (min): 20 min  Charges: OT General Charges $OT Visit: 1 Visit OT Treatments $Therapeutic Activity: 8-22 mins  Delight Stare, [place] Pager [phone] Office [phone]     Delight Stare 07/08/2019, 5:33 PM
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
## 2019-07-08 NOTE — Progress Notes (Addendum)
Progress Note  Patient Name: Dennis [name] Date of Encounter: 07/08/2019  Primary Cardiologist:  Carlyle Dolly, MD  Subjective   Leg bothering him, did not eat breakfast. Says could not get to food, RN says he was set up to eat but did not.  No chest pain or SOB Reports he had palpitations at times pta  Inpatient Medications    Scheduled Meds:  aspirin EC  81 mg Oral Daily   docusate sodium  100 mg Oral Daily   insulin aspart  0-15 Units Subcutaneous TID WC   metFORMIN  850 mg Oral Q breakfast   metoprolol tartrate  25 mg Oral BID   pantoprazole  40 mg Oral Daily   rosuvastatin  20 mg Oral Daily   silver sulfADIAZINE  1 application Topical Daily   cyanocobalamin  1,000 mcg Oral Daily   Continuous Infusions:  heparin 1,600 Units/hr (07/08/19 0300)   magnesium sulfate bolus IVPB     PRN Meds: acetaminophen **OR** acetaminophen, alum & mag hydroxide-simeth, bisacodyl, diazepam, furosemide, guaiFENesin-dextromethorphan, hydrALAZINE, HYDROcodone-acetaminophen, labetalol, linaclotide, LORazepam, magnesium sulfate bolus IVPB, morphine injection, nitroGLYCERIN, ondansetron, phenol, polyethylene glycol, potassium chloride   Vital Signs    Vitals:   07/07/19 1511 07/07/19 1925 07/07/19 2326 07/08/19 0249  BP: 123/60 (!) 122/57 108/62 120/67  Pulse:  (!) 110 95 90  Resp:  16 18 16   Temp: 97.8 F (36.6 C) 98.2 F (36.8 C) 97.6 F (36.4 C) 97.7 F (36.5 C)  TempSrc: Oral Oral Oral Oral  SpO2:  98% 98% 98%  Weight:      Height:        Intake/Output Summary (Last 24 hours) at 07/08/2019 [id number] Last data filed at 07/08/2019 0300 Gross per 24 hour  Intake 752.68 ml  Output 1000 ml  Net -247.32 ml   Filed Weights   07/01/19 2034 07/04/19 0335  Weight: 96.7 kg 96.7 kg   Last Weight  Most recent update: 07/04/2019  3:37 AM   Weight  96.7 kg (213 lb 3 oz)           Weight change:    Telemetry    SR, PVCs  - Personally Reviewed  ECG    None  today - Personally Reviewed  Physical Exam   General: Well developed, well nourished, male appearing in no acute distress. Head: Normocephalic, atraumatic.  Neck: Supple without bruits, JVD not elevated. Lungs:  Resp regular and unlabored, CTA. Heart: RRR, S1, S2, no S3, S4, or murmur; no rub. Abdomen: Soft, non-tender, non-distended with normoactive bowel sounds. No hepatomegaly. No rebound/guarding. No obvious abdominal masses. Extremities: No clubbing, cyanosis, no edema. Distal pedal pulses are 1-2+ bilaterally. RLE wounds without drainage, bandages not disturbed Neuro: Alert and oriented X 3. Moves all extremities spontaneously.  Labs    Hematology Recent Labs  Lab 07/02/19 0323 07/04/19 0801 07/07/19 0330  WBC 10.0 9.8 9.4  RBC 2.99* 2.66* 2.57*  HGB 9.2* 8.2* 7.9*  HCT 26.6* 24.1* 23.6*  MCV 89.0 90.6 91.8  MCH 30.8 30.8 30.7  MCHC 34.6 34.0 33.5  RDW 13.1 13.3 14.0  PLT 229 238 290    Chemistry Recent Labs  Lab 07/02/19 0323 07/04/19 0801 07/07/19 1048  NA 137 138 140  K 4.5 4.1 3.9  CL 103 106 111  CO2 23 26 19*  GLUCOSE 180* 79 71  BUN 19 24* 18  CREATININE 1.33* 1.32* 1.32*  CALCIUM 8.2* 7.9* 7.9*  GFRNONAA 53* 54* 54*  GFRAA >60 >60 >60  ANIONGAP 11 6 10     Lab Results  Component Value Date   TSH 1.901 12/17/2017     Radiology    Ct Head Wo Contrast  Result Date: 07/05/2019 CLINICAL DATA:  73 year old male with acute altered mental status and confusion. EXAM: CT HEAD WITHOUT CONTRAST TECHNIQUE: Contiguous axial images were obtained from the base of the skull through the vertex without intravenous contrast. COMPARISON:  None. FINDINGS: Brain: No evidence of acute infarction, hemorrhage, hydrocephalus, extra-axial collection or mass lesion/mass effect. Atrophy, chronic small-vessel white matter ischemic changes and remote RIGHT caudate lacunar infarct noted. Vascular: Carotid atherosclerotic calcifications noted. Skull: Normal. Negative for  fracture or focal lesion. Sinuses/Orbits: No acute finding. Other: None IMPRESSION: 1. No evidence of acute intracranial abnormality. 2. Atrophy, chronic small-vessel white matter ischemic changes and remote RIGHT caudate lacunar infarct. Electronically Signed   By: Margarette Canada M.D.   On: 07/05/2019 14:56   Vas [place] Burnard Bunting With/wo Tbi  Result Date: 07/06/2019 LOWER EXTREMITY DOPPLER STUDY Indications: Peripheral artery disease.  Vascular Interventions: 07/01/2019 - Right FEMORAL- POSTERIOR TIBIAL ARTERY                         BYPASS GRAFT with Composite PTFE and Reversed Saphenous                         Vein, RIGHT Common FEMORAL ENDARTERECTOMY with                         Profundoplasty (Right Leg Upper). Comparison Study: 05/22/2019 - R 0.35 L 0.71 Performing Technologist: Carlos Levering Rvt  Examination Guidelines: A complete evaluation includes at minimum, Doppler waveform signals and systolic blood pressure reading at the level of bilateral brachial, anterior tibial, and posterior tibial arteries, when vessel segments are accessible. Bilateral testing is considered an integral part of a complete examination. Photoelectric Plethysmograph (PPG) waveforms and toe systolic pressure readings are included as required and additional duplex testing as needed. Limited examinations for reoccurring indications may be performed as noted.  ABI Findings: +--------+------------------+-----+---------+--------+  Right    Rt Pressure (mmHg) Index Waveform  Comment   +--------+------------------+-----+---------+--------+  Brachial 154                      triphasic           +--------+------------------+-----+---------+--------+  PTA      157                1.02  biphasic            +--------+------------------+-----+---------+--------+  DP       142                0.92  biphasic            +--------+------------------+-----+---------+--------+ +--------+------------------+-----+---------+-------+  Left     Lt Pressure  (mmHg) Index Waveform  Comment  +--------+------------------+-----+---------+-------+  Brachial 142                      triphasic          +--------+------------------+-----+---------+-------+  PTA      112                0.73  triphasic          +--------+------------------+-----+---------+-------+  DP       132  0.86  triphasic          +--------+------------------+-----+---------+-------+ +-------+-----------+-----------+------------+------------+  ABI/TBI Today's ABI Today's TBI Previous ABI Previous TBI  +-------+-----------+-----------+------------+------------+  Right   1.02                    0.35                       +-------+-----------+-----------+------------+------------+  Left    0.86                    0.71                       +-------+-----------+-----------+------------+------------+  Summary: Right: Resting right ankle-brachial index is within normal range. No evidence of significant right lower extremity arterial disease. 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  *See table(s) above for measurements and observations.  Electronically signed by Harold Barban MD on 07/06/2019 at 12:45:38 AM.    Final      Cardiac Studies   None this admit  Patient Profile     73 y.o. male w/ hx CAD, (s/p prior stenting of RCA and OM2 in 2006, s/p STEMI in 10/2017 with stenting of mid-RCA and PDA with DES x2), post-operative atrial fibrillation (occurring in 11/2017 following hip surgery), HTN, HLD, and Type 2 DM. He was admitted 10/12 for RLE aortogram>>R-CFA endarterectomy w/ vein patch & R-CFA>post tib w/ composite PTFE greater saph vein. Cards saw 10/18 for Afib  Assessment & Plan    1. Afib, RVR:  - hx of this 11/2017, not on anticoag pta, was to start it if further episodes - spont conversion to SR - on IV heparin - CHA2DS2-VASc = 4 (age x 1, CAD, HTN, DM)  - MD advise on changing anticoag to oral-- see below    2. PAD w/  non-healing wounds, s/p R-CFA endart, R-CFA>pop composite bypass - per VVS  3. CAD - per [name], he is to be on DAPT indefinitely  Active Problems:   PAD (peripheral artery disease) ([place])   Signed, Rosaria Ferries , PA-C 8:21 AM 07/08/2019 Pager: [phone]  Pt seen and examined   Assessment by M Croituru reviewed PT denies CP   Breathing fair  Lungs are rel clear Cardiac RRR  No significant murnurs Ext are without edema Bandages on R  Impression   Atrial fibrillation    Currently on heparin Telemetry appears to show SR with ectopy  Poor tracings  WOuld get periodic EKGs He is at high embolic risk   Now with second episode of documented atrial fibrllation in the past several montis    Optimally he would start back on Eliquis  to minimize risk of embolic stroke with long term recomm Plavix and ELiquis   Would recomm repeating CBC as Hgb yesterday was 7.9  This may indeed limit Rx    Will continue to follow  Dorris Carnes MD      I

## 2019-07-08 NOTE — Progress Notes (Signed)
Pt heard yelling loudly at some one on the phone.  After phone call ended, offered pain meds, Pt declined.  Became tearful.  Held hand for about 15 mins while Pt cried.  Assisted to call wife.  Gave prn anxiety med and assisted back to bed with NT.  Currently resting calmly.  Will con't plan of care.

## 2019-07-08 NOTE — TOC Initial Note (Signed)
Transition of Care [facility]) - Initial/Assessment Note    Patient Details  Name: Dennis [name] MRN: [id number] Date of Birth: 01/22/1946  [facility]) CM/SW Contact:    Vinie Sill, [place] Phone Number: 07/08/2019, 9:57 AM  Clinical Narrative:                  CSW spoke with the patient's spouse. CSW introduced self and explained role. CSW discussed PT recommendation of ST rehab at [facility]. Patient's spouse declined SNF and prefers the patient is discharged home. She expressed concerns about his confusion and believes he will be "better off at home" with [facility] services. CSW verbally discussed [facility] providers listed on the Medicare.gov list. She states she was not familiar with the [facility] agencies listed, she will review for choice. CSW placed copy of [place] list in patient's room and shadow chart. Patient's spouse states no other questions or concerns at this time.  Thurmond Butts, MSW, [facility] Clinical Social Worker [phone]   Expected Discharge Plan: [place]     Patient Goals and CMS Choice   CMS Medicare.gov Compare Post Acute Care list provided to:: Patient Choice offered to / list presented to : Patient  Expected Discharge Plan and Services Expected Discharge Plan: [place]                                              Prior Living Arrangements/Services                       Activities of Daily Living Home Assistive Devices/Equipment: Cane (specify quad or straight), Walker (specify type) ADL Screening (condition at time of admission) Patient's cognitive ability adequate to safely complete daily activities?: Yes Is the patient deaf or have difficulty hearing?: No Does the patient have difficulty seeing, even when wearing glasses/contacts?: Yes Does the patient have difficulty concentrating, remembering, or making decisions?: No Patient able to express need for assistance with ADLs?: Yes Does the patient have  difficulty dressing or bathing?: Yes Independently performs ADLs?: No Communication: Independent Dressing (OT): Needs assistance Is this a change from baseline?: Pre-admission baseline Grooming: Independent Feeding: Independent Bathing: Needs assistance Is this a change from baseline?: Pre-admission baseline Toileting: Independent In/Out Bed: Independent Walks in Home: Independent with device (comment) Does the patient have difficulty walking or climbing stairs?: Yes Weakness of Legs: Both Weakness of Arms/Hands: None  Permission Sought/Granted Permission sought to share information with : Family Supports, Case Manager                Emotional Assessment   Attitude/Demeanor/Rapport: Unable to Assess Affect (typically observed): Unable to Assess Orientation: : Oriented to Place, Oriented to Self Alcohol / Substance Use: Not Applicable Psych Involvement: No (comment)  Admission diagnosis:  claudication Patient Active Problem List   Diagnosis Date Noted  . PAD (peripheral artery disease) ([place]) 07/01/2019  . Palpitations 01/23/2018  . Chronic anticoagulation 01/23/2018  . Chronic back pain 01/23/2018  . Atrial fibrillation with RVR ([place])   . PAF (paroxysmal atrial fibrillation) ([place]) 12/15/2017  . Delirium   . Closed right hip fracture ([place]) 12/09/2017  . CAD S/P percutaneous coronary angioplasty 12/04/2017  . Hypertension 12/04/2017  . Hyperlipidemia 11/15/2017  . Tobacco abuse 11/13/2017  . History of ST elevation myocardial infarction (STEMI) 11/13/2017  . Insulin  dependent diabetes mellitus with complications [id number]   PCP:  Glenda Chroman, MD Pharmacy:   [facility], [place] [address] Phone: [phone] Fax: [phone]     Social Determinants of Health (SDOH) Interventions    Readmission Risk Interventions No flowsheet data found.

## 2019-07-09 LAB — CBC
HCT: 27.8 % — ABNORMAL LOW (ref 39.0–52.0)
Hemoglobin: 9 g/dL — ABNORMAL LOW (ref 13.0–17.0)
MCH: 29.8 pg (ref 26.0–34.0)
MCHC: 32.4 g/dL (ref 30.0–36.0)
MCV: 92.1 fL (ref 80.0–100.0)
Platelets: 370 10*3/uL (ref 150–400)
RBC: 3.02 MIL/uL — ABNORMAL LOW (ref 4.22–5.81)
RDW: 14.3 % (ref 11.5–15.5)
WBC: 11.3 10*3/uL — ABNORMAL HIGH (ref 4.0–10.5)
nRBC: 0 % (ref 0.0–0.2)

## 2019-07-09 LAB — GLUCOSE, CAPILLARY
Glucose-Capillary: 166 mg/dL — ABNORMAL HIGH (ref 70–99)
Glucose-Capillary: 81 mg/dL (ref 70–99)
Glucose-Capillary: 156 mg/dL — ABNORMAL HIGH (ref 70–99)
Glucose-Capillary: 107 mg/dL — ABNORMAL HIGH (ref 70–99)

## 2019-07-09 LAB — HEPARIN LEVEL (UNFRACTIONATED): Heparin Unfractionated: 0.61 IU/mL (ref 0.30–0.70)

## 2019-07-09 NOTE — Discharge Instructions (Addendum)
° °[facility] ° °Discharge instructions ° °Lower Extremity Bypass Surgery ° °Please refer to the following instruction for your post-procedure care. Your surgeon or physician assistant will discuss any changes with you. ° °Activity ° °You are encouraged to walk as much as you can. You can slowly return to normal activities during the month after your surgery. Avoid strenuous activity and heavy lifting until your doctor tells you it's OK. Avoid activities such as vacuuming or swinging a golf club. Do not drive until your doctor give the OK and you are no longer taking prescription pain medications. It is also normal to have difficulty with sleep habits, eating and bowel movement after surgery. These will go away with time. ° °Bathing/Showering ° °Shower daily after you go home. Do not soak in a bathtub, hot tub, or swim until the incision heals completely. ° °Incision Care ° °Clean your incision with mild soap and water. Shower every day. Pat the area dry with a clean towel. You do not need a bandage unless otherwise instructed. Do not apply any ointments or creams to your incision. If you have open wounds you will be instructed how to care for them or a visiting nurse may be arranged for you. If you have staples or sutures along your incision they will be removed at your post-op appointment. You may have skin glue on your incision. Do not peel it off. It will come off on its own in about one week. ° °Wash the groin wound with soap and water daily and pat dry. (No tub bath-only shower)  Then put a dry gauze or washcloth in the groin to keep this area dry to help prevent wound infection.  Do this daily and as needed.  Do not use Vaseline or neosporin on your incisions.  Only use soap and water on your incisions and then protect and keep dry. ° °Diet ° °Resume your normal diet. There are no special food restrictions following this procedure. A low fat/ low cholesterol diet is  recommended for all patients with vascular disease. In order to heal from your surgery, it is CRITICAL to get adequate nutrition. Your body requires vitamins, minerals, and protein. Vegetables are the best source of vitamins and minerals. Vegetables also provide the perfect balance of protein. Processed food has little nutritional value, so try to avoid this. ° °Medications ° °Resume taking all your medications unless your doctor or physician assistant tells you not to. If your incision is causing pain, you may take over-the-counter pain relievers such as acetaminophen (Tylenol). If you were prescribed a stronger pain medication, please aware these medication can cause nausea and constipation. Prevent nausea by taking the medication with a snack or meal. Avoid constipation by drinking plenty of fluids and eating foods with high amount of fiber, such as fruits, vegetables, and grains. Take Colace 100 mg (an over-the-counter stool softener) twice a day as needed for constipation.  °Do not take Tylenol if you are taking prescription pain medications. ° °Follow Up ° °Our office will schedule a follow up appointment 2-3 weeks following discharge. ° °Please call us immediately for any of the following conditions ° °•Severe or worsening pain in your legs or feet while at rest or while walking •Increase pain, redness, warmth, or drainage (pus) from your incision site(s) °Fever of 101 degree or higher °The swelling in your leg with the bypass suddenly worsens and becomes more painful than when you were in the hospital °If you have   been instructed to feel your graft pulse then you should do so every day. If you can no longer feel this pulse, call the office immediately. Not all patients are given this instruction. ° °Leg swelling is common after leg bypass surgery. ° °The swelling should improve over a few months following surgery. To improve the swelling, you may elevate your legs above the level of your heart while you are  sitting or resting. Your surgeon or physician assistant may ask you to apply an ACE wrap or wear compression (TED) stockings to help to reduce swelling. ° °Reduce your risk of vascular disease ° °Stop smoking. If you would like help call [facility] at 1-800-QUIT-NOW (1-800-784-8669) or [place] at [phone]. ° °Manage your cholesterol °Maintain a desired weight °Control your diabetes weight °Control your diabetes °Keep your blood pressure down ° °If you have any questions, please call the office at [phone] ° ° ° °Information on my medicine - ELIQUIS® (apixaban) ° °Why was Eliquis® prescribed for you? °Eliquis® was prescribed for you to reduce the risk of a blood clot forming that can cause a stroke if you have a medical condition called atrial fibrillation (a type of irregular heartbeat). ° °What do You need to know about Eliquis® ? °Take your Eliquis® TWICE DAILY - one tablet in the morning and one tablet in the evening with or without food. If you have difficulty swallowing the tablet whole please discuss with your pharmacist how to take the medication safely. ° °Take Eliquis® exactly as prescribed by your doctor and DO NOT stop taking Eliquis® without talking to the doctor who prescribed the medication.  Stopping may increase your risk of developing a stroke.  Refill your prescription before you run out. ° °After discharge, you should have regular check-up appointments with your healthcare provider that is prescribing your Eliquis®.  In the future your dose may need to be changed if your kidney function or weight changes by a significant amount or as you get older. ° °What do you do if you miss a dose? °If you miss a dose, take it as soon as you remember on the same day and resume taking twice daily.  Do not take more than one dose of ELIQUIS at the same time to make up a missed dose. ° °Important Safety Information °A possible side effect of Eliquis® is bleeding. You should call your healthcare  provider right away if you experience any of the following: °? Bleeding from an injury or your nose that does not stop. °? Unusual colored urine (red or dark brown) or unusual colored stools (red or black). °? Unusual bruising for unknown reasons. °? A serious fall or if you hit your head (even if there is no bleeding). ° °Some medicines may interact with Eliquis® and might increase your risk of bleeding or clotting while on Eliquis®. To help avoid this, consult your healthcare provider or pharmacist prior to using any new prescription or non-prescription medications, including herbals, vitamins, non-steroidal anti-inflammatory drugs (NSAIDs) and supplements. ° °This website has more information on Eliquis® (apixaban): http://www.eliquis.com/eliquis/home °

## 2019-07-09 NOTE — TOC Benefit Eligibility Note (Signed)
Transition of Care [facility]) Benefit Eligibility Note    Patient Details  Name: JAN SAWADA MRN: [id number] Date of Birth: 1946-03-05   Medication/Dose: Eliquis 5 mg bid  Covered?: Yes  Tier: (None)  Prescription Coverage Preferred Pharmacy: Any retail pharmacy  Spoke with Person/Company/Phone Number:: Ashley/ [employer]/[phone]  Co-Pay: No copay  Prior Approval: No  Deductible: (LCS)       Orbie Pyo Phone Number: 07/09/2019, 10:10 AM

## 2019-07-09 NOTE — Progress Notes (Signed)
   Pt remains in SR    Note plans for anticoagulation with Eliquis   Continue Plavix  Stop ASA Pt will need close follow up of Hgb with this regimen    No new recommendcations  Will continue to follow      Signed, Dorris Carnes, MD  07/09/2019, 10:40 AM

## 2019-07-09 NOTE — Progress Notes (Signed)
ANTICOAGULATION CONSULT NOTE - [place] for heparin Indication: atrial fibrillation  Allergies  Allergen Reactions  . Betadine [Povidone Iodine] Swelling    Facial swelling  . Fish Allergy Swelling    Facial swelling. Many years ago.  . Iodine Swelling    Facial swelling  . Shellfish Allergy Swelling    Facial swelling  . Sulfamethoxazole-Trimethoprim Swelling    Facial swelling  . Gabapentin Other (See Comments)    Unknown reaction  . Chlorhexidine   . Trimethoprim Swelling    Facial swelling - Doesn't think he ever took this by itself.  . Tramadol Other (See Comments)    Makes him crazy    Patient Measurements: Height: 6' (182.9 cm) Weight: 213 lb 3 oz (96.7 kg) IBW/kg (Calculated) : 77.6 Heparin Dosing Weight: 96.7 kg  Vital Signs: Temp: 97.9 F (36.6 C) (10/20 0753) Temp Source: Oral (10/20 0753) BP: 130/65 (10/20 0753) Pulse Rate: 83 (10/20 0753)  Labs: Recent Labs    07/07/19 0330 07/07/19 1048  07/07/19 2229 07/08/19 1206 07/09/19 0409  HGB 7.9*  --   --   --  8.6* 9.0*  HCT 23.6*  --   --   --  25.5* 27.8*  PLT 290  --   --   --  344 370  HEPARINUNFRC  --   --    < > 0.38 0.45 0.61  CREATININE  --  1.32*  --   --  1.36*  --    < > = values in this interval not displayed.    Estimated Creatinine Clearance: 60 mL/min (A) (by C-G formula based on SCr of 1.36 mg/dL (H)).    Assessment: 57 yom with a fib. Was previously not on anticoagulation d/t bleeding during surgery on RLE which is now resolved. No AC PTA, just plavix and ASA.   Heparin level therapeutic, CBC stable  Goal of Therapy:  Heparin level 0.3-0.7 units/ml Monitor platelets by anticoagulation protocol: Yes   Plan:  Continue heparin at 1600 units / hr Follow up AM labs Apixaban at discharge  Thank you Anette Guarneri, PharmD  Please check AMION for all [place] numbers 07/09/2019

## 2019-07-09 NOTE — TOC Progression Note (Addendum)
Transition of Care (TOC) - Progression Note  Marvetta Gibbons RN, BSN Transitions of Care Unit 4E- RN Case Manager [phone]   Patient Details  Name: Dennis [name] MRN: [id number] Date of Birth: [birth year]  Transition of Care [facility]) CM/SW Contact  Dahlia Client, Romeo Rabon, RN Phone Number: 07/09/2019, 1:55 PM  Clinical Narrative:    F/u done with pt and wife regarding HH needs- per CSW wife has decided to take pt home and not STSNF. Wife confirmed this at bedside- [facility] choice has been provided Per CMS guidelines from medicare.gov website with star ratings (copy placed in shadow chart)- wife thinks they have used [facility] in past- call made to [place] with [facility] to check- but per Malachy Mood pt has was not in system- and [facility] is out of network with insurance- reviewed list again with pt and wife they are unsure who they have used but want to use them again- permission given to this CM to make some calls on pt behalf to see if agency can be found that they used in past. Call made to [facility]- but pt not in their system- however they are in [place] and can take referral. Call also made to [facility]- who did find pt in system from last Sept. 2019- they are also in network and can take referral- f/u done with pt and wife to provide above info and give choice- pt and wife state they would like to use [facility] again- call made back to [facility]- spoke with Jinny Blossom for referral needs- PT/OT- faxed orders and needed paperwork to [phone] via epic.  Noted possible plan for Eliquis- per benefits check- copay- Zero dollars  Expected Discharge Plan: [place] Barriers to Discharge: Continued Medical Work up  Expected Discharge Plan and Services Expected Discharge Plan: [place] In-house Referral: Clinical Social Work Discharge Planning Services: CM Consult Post Acute Care Choice: [place] arrangements for the past 2 months: Single Family  Home                 DME Arranged: N/A DME Agency: NA       HH Arranged: PT, OT [place] Agency: Other - See comment Date HH Agency Contacted: 07/09/19 Time [place]: 1354 Representative spoke with at [place]: [place] ([place]) Interventions    Readmission Risk Interventions No flowsheet data found.

## 2019-07-09 NOTE — Progress Notes (Signed)
Inpatient Rehab Admissions:  Inpatient Rehab Consult received. Noted pt has plans for DC home with [facility]. Agree with current plan and recommendations from therapy. Feel that given pt's confusion, he would not be able to meaningfully participate in IP Rehab program and may do better in his own home environment. AC will not pursue CIR for this patient.  AC will sign off at this time.   Jhonnie Garner, OTR/L  Rehab Admissions Coordinator  [phone] 07/09/2019 3:04 PM

## 2019-07-09 NOTE — Progress Notes (Signed)
Occupational Therapy Treatment Patient Details Name: Dennis [name] MRN: [id number] DOB: [birth year] Today's Date: 07/09/2019    History of present illness Patient is a 73 y/o male who presents with BLE pain with nonhealing wounds on right foot, s/p Rt CFA endarterectomy fem to PTA composite bypass 10/12. PMH includes chronic pain, CAD, s/p PCI, DM, HTN, STEMI, Rt THA.   OT comments  Pt complete LB dressing from EOB with MOD- MAX A. Pt demonstrated ability to doff R sock with cane but required MAX A to don from EOB. Pt complete simulated toilet transfer with MIN A for initial stand with RW. Pt complete bed mobility MIN guard this session. Pt required cues to initiate log roll but no physical assist needed. Pt confused throughout session needing cues to orient pt back to situation as pt thought we were at his house throughout session. Pt declining SNF, likely to DC home with [facility]; will continue to follow acutely for OT needs.   Follow Up Recommendations  Home health OT;Supervision/Assistance - 24 hour    Equipment Recommendations  None recommended by OT    Recommendations for Other Services      Precautions / Restrictions Precautions Precautions: Fall;Back Precaution Comments: Back precautions for comfort due to severe chronic back pain Restrictions Weight Bearing Restrictions: Yes RLE Weight Bearing: Weight bearing as tolerated Other Position/Activity Restrictions: pts right leg has been tight for years - he has walked on toes for years       Mobility Bed Mobility Overal bed mobility: Needs Assistance Bed Mobility: Rolling;Sidelying to Sit Rolling: Supervision Sidelying to sit: Min guard       General bed mobility comments: pt required less assist this session for bed mobility; HOB flat and min guard to elevate trunk into sitting  Transfers Overall transfer level: Needs assistance Equipment used: Rolling walker (2 wheeled) Transfers: Sit to/from Stand Sit to Stand: +2  safety/equipment;Min assist         General transfer comment: cues for hand placement. Assist to power up.    Balance Overall balance assessment: Needs assistance Sitting-balance support: Feet supported;No upper extremity supported Sitting balance-Leahy Scale: Fair Sitting balance - Comments: EOB supervision   Standing balance support: Bilateral upper extremity supported;During functional activity Standing balance-Leahy Scale: Poor Standing balance comment: reliant on BUE support                           ADL either performed or assessed with clinical judgement   ADL Overall ADL's : Needs assistance/impaired                 Upper Body Dressing : Sitting;Minimal assistance Upper Body Dressing Details (indicate cue type and reason): MIN A to don hospital gown at [place] Body Dressing: Moderate assistance;Sit to/from stand;Maximal assistance Lower Body Dressing Details (indicate cue type and reason): pt able to doff R sock by using cane; Pt required assist to don sock from EOB Toilet Transfer: RW;Minimal assistance;Ambulation Toilet Transfer Details (indicate cue type and reason): simulated in room          Functional mobility during ADLs: Minimal assistance;+2 for safety/equipment;Rolling walker General ADL Comments: pt complete LB dressing from EOB with MOD- MAX A; pt requires increased assit for LLE     Vision Baseline Vision/History: Wears glasses Wears Glasses: At all times Patient Visual Report: No change from baseline Vision Assessment?: No apparent visual deficits Additional Comments: reports light sensitivity and noted to be neglect items  on R side during functional mobility as pt almost tripped over bag in floor; maybe d/t confusion   Perception     Praxis      Cognition Arousal/Alertness: Awake/alert Behavior During Therapy: WFL for tasks assessed/performed Overall Cognitive Status: Impaired/Different from baseline Area of Impairment:  Following commands;Attention;Problem solving;Awareness;Safety/judgement;Memory;Orientation                 Orientation Level: Disoriented to;Time;Place Current Attention Level: Selective Memory: Decreased short-term memory Following Commands: Follows multi-step commands with increased time Safety/Judgement: Decreased awareness of safety Awareness: Emergent Problem Solving: Slow processing;Requires verbal cues General Comments: increased time for processing and safety awareness; required cues throuhout to orient pt to current situation as pt thought he was at home throughout session        Exercises     Shoulder Instructions       General Comments      Pertinent Vitals/ Pain       Pain Assessment: Faces Faces Pain Scale: Hurts even more Pain Location: back and R LE  Pain Descriptors / Indicators: Grimacing;Guarding;Discomfort;Tightness Pain Intervention(s): Monitored during session;Repositioned  Home Living                                          Prior Functioning/Environment              Frequency  Min 1X/week        Progress Toward Goals  OT Goals(current goals can now be found in the care plan section)  Progress towards OT goals: Progressing toward goals  Acute Rehab OT Goals Patient Stated Goal: decrease pain OT Goal Formulation: With patient Time For Goal Achievement: 07/16/19 Potential to Achieve Goals: Good  Plan Discharge plan remains appropriate;Frequency remains appropriate    Co-evaluation    PT/OT/SLP Co-Evaluation/Treatment: Yes Reason for Co-Treatment: Necessary to address cognition/behavior during functional activity;For patient/therapist safety;To address functional/ADL transfers   OT goals addressed during session: ADL's and self-care      AM-PAC OT "6 Clicks" Daily Activity     Outcome Measure   Help from another person eating meals?: None Help from another person taking care of personal grooming?: A  Little Help from another person toileting, which includes using toliet, bedpan, or urinal?: A Little Help from another person bathing (including washing, rinsing, drying)?: A Lot Help from another person to put on and taking off regular upper body clothing?: A Little Help from another person to put on and taking off regular lower body clothing?: A Lot 6 Click Score: 17    End of Session Equipment Utilized During Treatment: Gait belt;Other (comment);Rolling walker(2 canes)  OT Visit Diagnosis: Other abnormalities of gait and mobility (R26.89);Pain;Muscle weakness (generalized) (M62.81) Pain - Right/Left: Right Pain - part of body: Leg   Activity Tolerance Patient tolerated treatment well   Patient Left with call bell/phone within reach;in chair;with chair alarm set   Nurse Communication          Time: [id number] OT Time Calculation (min): 28 min  Charges: OT General Charges $OT Visit: 1 Visit OT Treatments $Self Care/Home Management : 8-22 mins  [place], [place] 07/09/2019, 4:23 PM

## 2019-07-09 NOTE — Progress Notes (Addendum)
From record Pt hasn't had bowel movement since 07/02/2019. Refused Dulcolax suppository, agreed to take Miralax. Med given.  From RN day shift reported Pt had low to no appetite. Encouraged to get more adequate oral intake, even though Pt was confused and frequently non compliant with care, especially at night.   He was restless and agitated. Complained having severe lower back pain. Morphine 2 mg given x 2 doses tonight. Able to rest for a short time, about 30 minutes then tried to get out of bed. However this time he did not have aggressive behavior like in the evening, he's easily to redirect him back to bed.  He needs reassurance and family member at bedside if possible. Will hand off to day shift RN to coordinate with Pt's spouse.   Will continue to monitor.  Xandria Gallaga.RN

## 2019-07-09 NOTE — Progress Notes (Addendum)
  Progress Note    07/09/2019 7:23 AM 8 Days Post-Op  Subjective:  Sleeping comfortable  Tm 99 HR 80's-110's ST [id number] systolic  0000000 RA  Vitals:   07/09/19 0400 07/09/19 0457  BP: 120/63   Pulse: (!) 105   Resp: 15 14  Temp:    SpO2: 100% 96%    Physical Exam: General:  Resting comfortably Lungs:  Non labored  CBC    Component Value Date/Time   WBC 11.3 (H) 07/09/2019 0409   RBC 3.02 (L) 07/09/2019 0409   HGB 9.0 (L) 07/09/2019 0409   HCT 27.8 (L) 07/09/2019 0409   HCT 31.9 (L) 12/17/2017 0338   PLT 370 07/09/2019 0409   MCV 92.1 07/09/2019 0409   MCH 29.8 07/09/2019 0409   MCHC 32.4 07/09/2019 0409   RDW 14.3 07/09/2019 0409   LYMPHSABS 3.3 12/22/2017 0433   MONOABS 1.1 (H) 12/22/2017 0433   EOSABS 0.4 12/22/2017 0433   BASOSABS 0.1 12/22/2017 0433    BMET    Component Value Date/Time   NA 137 07/08/2019 1206   K 3.8 07/08/2019 1206   CL 107 07/08/2019 1206   CO2 23 07/08/2019 1206   GLUCOSE 94 07/08/2019 1206   BUN 14 07/08/2019 1206   CREATININE 1.36 (H) 07/08/2019 1206   CALCIUM 8.1 (L) 07/08/2019 1206   GFRNONAA 52 (L) 07/08/2019 1206   GFRAA >60 07/08/2019 1206    INR    Component Value Date/Time   INR 1.0 06/27/2019 1225     Intake/Output Summary (Last 24 hours) at 07/09/2019 0723 Last data filed at 07/09/2019 0500 Gross per 24 hour  Intake 919.4 ml  Output 1051 ml  Net -131.6 ml     Assessment:  73 y.o. male is s/p:  R CFA endart, femoral to PTA composite bypass   8 Days Post-Op  Plan: -per report, pt was agitated and aggressive last evening and in house security was called.  Pt sleeping this am and I did not wake him. -does not appear to have had further afib.  Continue heparin while in hospital and will start Eliquis at discharge.   -considering pt's paranoia, may benefit from psych consult.    Dennis Locket, PA-C [facility] [phone] 07/09/2019 7:23 AM  Pt awake. Still confused oriented  to [facility] does not know he had operation knows year and president  Incisions healing Biphasic PT doppler still with some edema in leg continue ACE Will get rehab consult  Attempted to call wife. No answer on phone   Dennis Hinds, MD [facility] [place] Office: [phone]

## 2019-07-09 NOTE — Progress Notes (Signed)
Physical Therapy Treatment Patient Details Name: Dennis [name] MRN: [id number] DOB: [birth year] Today's Date: 07/09/2019    History of Present Illness Patient is a 73 y/o male who presents with BLE pain with nonhealing wounds on right foot, s/p Rt CFA endarterectomy fem to PTA composite bypass 10/12. PMH includes chronic pain, CAD, s/p PCI, DM, HTN, STEMI, Rt THA.    PT Comments    Patient seen for mobility progression. Pt continues to be confused and believed he was at home already throughout session. This session focused on gait/stair training as pt and pt's wife prefer home vs SNF. Given that pt's wife reported on PT eval that pt refuses to use RW at home pt's bilat SPC were used. Pt grossly requires min A (+2 for safety) for OOB mobility but increased assist needed to recover from LOB in hallway. Pt requires cues to navigate environment and cues for safety throughout. Pt educated that he really should use RW upon d/c to decrease risk of falls. Continue to progress as tolerated.     Follow Up Recommendations  Home health PT;Supervision/Assistance - 24 hour     Equipment Recommendations  None recommended by PT    Recommendations for Other Services       Precautions / Restrictions Precautions Precautions: Fall;Back Precaution Comments: Back precautions for comfort due to severe chronic back pain Restrictions Weight Bearing Restrictions: Yes RLE Weight Bearing: Weight bearing as tolerated Other Position/Activity Restrictions: pts right leg has been tight for years - he has walked on toes for years    Mobility  Bed Mobility Overal bed mobility: Needs Assistance Bed Mobility: Rolling;Sidelying to Sit Rolling: Supervision Sidelying to sit: Min guard       General bed mobility comments: cues for sequencing and use of rail; min guard for safety  Transfers Overall transfer level: Needs assistance Equipment used: (bilat SPC as wife has reported pt refuses RW at  home) Transfers: Sit to/from Stand Sit to Stand: +2 safety/equipment;Min assist         General transfer comment: cues for hand placement. Assist to power up and for balance  Ambulation/Gait Ambulation/Gait assistance: Min assist;+2 safety/equipment Gait Distance (Feet): 65 Feet Assistive device: (bilat SPC) Gait Pattern/deviations: Step-to pattern;Decreased stance time - right;Decreased step length - left;Decreased step length - right;Trunk flexed Gait velocity: decreased   General Gait Details: cues for safety and pt needs directional cues to navigate environment; pt running into bag in floor of hallway despite cues and then with LOB anteriorly requiring assistance to recover; assist for balance    Stairs Stairs: Yes Stairs assistance: Min assist Stair Management: One rail Right;Step to pattern;With cane;Forwards;Sideways Number of Stairs: 3 General stair comments: cues for sequencing and safety   Wheelchair Mobility    Modified Rankin (Stroke Patients Only)       Balance Overall balance assessment: Needs assistance Sitting-balance support: Feet supported;No upper extremity supported Sitting balance-Leahy Scale: Fair Sitting balance - Comments: EOB supervision   Standing balance support: Bilateral upper extremity supported;During functional activity Standing balance-Leahy Scale: Poor Standing balance comment: reliant on BUE support                            Cognition Arousal/Alertness: Awake/alert Behavior During Therapy: WFL for tasks assessed/performed Overall Cognitive Status: Impaired/Different from baseline Area of Impairment: Following commands;Attention;Problem solving;Awareness;Safety/judgement;Memory;Orientation                 Orientation Level: Disoriented to;Time;Place Current Attention  Level: Selective Memory: Decreased short-term memory Following Commands: Follows multi-step commands with increased time Safety/Judgement:  Decreased awareness of safety Awareness: Emergent Problem Solving: Slow processing;Requires verbal cues General Comments: increased time for processing and safety awareness; required cues throuhout to orient pt to current situation as pt thought he was at home throughout session      Exercises      General Comments General comments (skin integrity, edema, etc.): pt educated to use RW at home for decreased risk of falls       Pertinent Vitals/Pain Pain Assessment: Faces Faces Pain Scale: Hurts even more Pain Location: back and R LE  Pain Descriptors / Indicators: Grimacing;Guarding;Discomfort;Tightness Pain Intervention(s): Limited activity within patient's tolerance;Monitored during session;Repositioned    Home Living                      Prior Function            PT Goals (current goals can now be found in the care plan section) Acute Rehab PT Goals Patient Stated Goal: decrease pain Progress towards PT goals: Progressing toward goals    Frequency    Min 3X/week      PT Plan Current plan remains appropriate    Co-evaluation PT/OT/SLP Co-Evaluation/Treatment: Yes Reason for Co-Treatment: Necessary to address cognition/behavior during functional activity;For patient/therapist safety;To address functional/ADL transfers PT goals addressed during session: Mobility/safety with mobility;Balance;Proper use of DME OT goals addressed during session: ADL's and self-care      AM-PAC PT "6 Clicks" Mobility   Outcome Measure  Help needed turning from your back to your side while in a flat bed without using bedrails?: A Little Help needed moving from lying on your back to sitting on the side of a flat bed without using bedrails?: A Little Help needed moving to and from a bed to a chair (including a wheelchair)?: A Little Help needed standing up from a chair using your arms (e.g., wheelchair or bedside chair)?: A Little Help needed to walk in hospital room?: A  Little Help needed climbing 3-5 steps with a railing? : A Lot 6 Click Score: 17    End of Session Equipment Utilized During Treatment: Gait belt Activity Tolerance: Patient tolerated treatment well Patient left: in chair;with call bell/phone within reach;with chair alarm set Nurse Communication: Mobility status PT Visit Diagnosis: Pain;Muscle weakness (generalized) (M62.81);Other abnormalities of gait and mobility (R26.89) Pain - Right/Left: Right Pain - part of body: Leg     Time: [id number] PT Time Calculation (min) (ACUTE ONLY): 29 min  Charges:  $Gait Training: 8-22 mins                     Earney Navy, PTA Acute Rehabilitation Services Pager: [phone] Office: [phone]     Darliss Cheney 07/09/2019, 5:08 PM

## 2019-07-10 LAB — CBC
HCT: 27.7 % — ABNORMAL LOW (ref 39.0–52.0)
Hemoglobin: 8.8 g/dL — ABNORMAL LOW (ref 13.0–17.0)
MCH: 30.2 pg (ref 26.0–34.0)
MCHC: 31.8 g/dL (ref 30.0–36.0)
MCV: 95.2 fL (ref 80.0–100.0)
Platelets: 357 10*3/uL (ref 150–400)
RBC: 2.91 MIL/uL — ABNORMAL LOW (ref 4.22–5.81)
RDW: 14.6 % (ref 11.5–15.5)
WBC: 9.9 10*3/uL (ref 4.0–10.5)
nRBC: 0 % (ref 0.0–0.2)

## 2019-07-10 LAB — GLUCOSE, CAPILLARY
Glucose-Capillary: 150 mg/dL — ABNORMAL HIGH (ref 70–99)
Glucose-Capillary: 163 mg/dL — ABNORMAL HIGH (ref 70–99)
Glucose-Capillary: 107 mg/dL — ABNORMAL HIGH (ref 70–99)
Glucose-Capillary: 113 mg/dL — ABNORMAL HIGH (ref 70–99)

## 2019-07-10 LAB — HEPARIN LEVEL (UNFRACTIONATED): Heparin Unfractionated: 0.21 IU/mL — ABNORMAL LOW (ref 0.30–0.70)

## 2019-07-10 NOTE — Progress Notes (Signed)
ANTICOAGULATION CONSULT NOTE - [place] for heparin Indication: atrial fibrillation  Allergies  Allergen Reactions  . Betadine [Povidone Iodine] Swelling    Facial swelling  . Fish Allergy Swelling    Facial swelling. Many years ago.  . Iodine Swelling    Facial swelling  . Shellfish Allergy Swelling    Facial swelling  . Sulfamethoxazole-Trimethoprim Swelling    Facial swelling  . Gabapentin Other (See Comments)    Unknown reaction  . Chlorhexidine   . Trimethoprim Swelling    Facial swelling - Doesn't think he ever took this by itself.  . Tramadol Other (See Comments)    Makes him crazy    Patient Measurements: Height: 6' (182.9 cm) Weight: 213 lb 3 oz (96.7 kg) IBW/kg (Calculated) : 77.6 Heparin Dosing Weight: 96.7 kg  Vital Signs: Temp: 97.7 F (36.5 C) (10/21 0344) Temp Source: Oral (10/21 0344) BP: 121/70 (10/21 0344) Pulse Rate: 100 (10/21 0344)  Labs: Recent Labs    07/07/19 1048  07/08/19 1206 07/09/19 0409 07/10/19 0649  HGB  --    < > 8.6* 9.0* 8.8*  HCT  --   --  25.5* 27.8* 27.7*  PLT  --   --  344 370 357  HEPARINUNFRC  --    < > 0.45 0.61 0.21*  CREATININE 1.32*  --  1.36*  --   --    < > = values in this interval not displayed.    Estimated Creatinine Clearance: 60 mL/min (A) (by C-G formula based on SCr of 1.36 mg/dL (H)).    Assessment: 5 yom with a fib. Was previously not on anticoagulation d/t bleeding during surgery on RLE which is now resolved. No AC PTA, just plavix and ASA.   Heparin level low this AM CBC stable  Goal of Therapy:  Heparin level 0.3-0.7 units/ml Monitor platelets by anticoagulation protocol: Yes   Plan:  Increase heparin to 1750 units / hr Follow up AM labs Apixaban at discharge  Thank you Anette Guarneri, PharmD  Please check AMION for all [place] numbers 07/10/2019

## 2019-07-10 NOTE — Progress Notes (Signed)
Patient noted calm and cooperative today.   Ambulated 200 ft with nurse & tolerated well.   IV got tangled with phone and call bell & came out.  Patient agreeable to have another IV inserted.   Currently resting in bed with no complaints.

## 2019-07-10 NOTE — Progress Notes (Signed)
Progress Note  Patient Name: Dennis [name] Date of Encounter: 07/10/2019  Primary Cardiologist: Carlyle Dolly, MD   Subjective   Pt comfortable laying in bed  Inpatient Medications    Scheduled Meds: . aspirin EC  81 mg Oral Daily  . docusate sodium  100 mg Oral Daily  . insulin aspart  0-15 Units Subcutaneous TID WC  . metFORMIN  850 mg Oral Q breakfast  . metoprolol tartrate  25 mg Oral BID  . pantoprazole  40 mg Oral Daily  . rosuvastatin  20 mg Oral Daily  . silver sulfADIAZINE  1 application Topical Daily  . cyanocobalamin  1,000 mcg Oral Daily   Continuous Infusions: . heparin 1,750 Units/hr (07/10/19 0836)  . magnesium sulfate bolus IVPB     PRN Meds: acetaminophen **OR** acetaminophen, alum & mag hydroxide-simeth, bisacodyl, diazepam, furosemide, guaiFENesin-dextromethorphan, haloperidol lactate, hydrALAZINE, HYDROcodone-acetaminophen, labetalol, linaclotide, LORazepam, magnesium sulfate bolus IVPB, morphine injection, nitroGLYCERIN, ondansetron, phenol, polyethylene glycol, potassium chloride   Vital Signs    Vitals:   07/09/19 1615 07/09/19 1952 07/10/19 0344 07/10/19 0845  BP: 120/61 (!) 98/51 121/70 131/65  Pulse: 91 99 100   Resp: 16 18 15  (!) 25  Temp: 97.8 F (36.6 C) 97.7 F (36.5 C) 97.7 F (36.5 C) 98.7 F (37.1 C)  TempSrc: Oral Oral Oral Oral  SpO2: 95% 96% 96% 96%  Weight:      Height:        Intake/Output Summary (Last 24 hours) at 07/10/2019 1119 Last data filed at 07/10/2019 0348 Gross per 24 hour  Intake 220 ml  Output 900 ml  Net -680 ml   Last 3 Weights 07/04/2019 07/01/2019 06/27/2019  Weight (lbs) 213 lb 3 oz 213 lb 3 oz 213 lb 4.8 oz  Weight (kg) 96.7 kg 96.7 kg 96.752 kg      Telemetry    SR and atiral fibrllation- Personally Reviewed  ECG     - Personally Reviewed  Physical Exam   GEN: No acute distress.   Neck: No JVD Cardiac: RRR, no murmurs, rubs, or gallops.  Respiratory: Clear to auscultation  bilaterally. GI: Soft, nontender, non-distended  MS: No edema; No deformity. Neuro:  Nonfocal  Psych: Normal affect   Labs    High Sensitivity Troponin:  No results for input(s): TROPONINIHS in the last 720 hours.    Chemistry Recent Labs  Lab 07/04/19 0801 07/07/19 1048 07/08/19 1206  NA 138 140 137  K 4.1 3.9 3.8  CL 106 111 107  CO2 26 19* 23  GLUCOSE 79 71 94  BUN 24* 18 14  CREATININE 1.32* 1.32* 1.36*  CALCIUM 7.9* 7.9* 8.1*  GFRNONAA 54* 54* 52*  GFRAA >60 >60 >60  ANIONGAP 6 10 7      Hematology Recent Labs  Lab 07/08/19 1206 07/09/19 0409 07/10/19 0649  WBC 9.7 11.3* 9.9  RBC 2.82* 3.02* 2.91*  HGB 8.6* 9.0* 8.8*  HCT 25.5* 27.8* 27.7*  MCV 90.4 92.1 95.2  MCH 30.5 29.8 30.2  MCHC 33.7 32.4 31.8  RDW 14.0 14.3 14.6  PLT 344 370 357    BNPNo results for input(s): BNP, PROBNP in the last 168 hours.   DDimer No results for input(s): DDIMER in the last 168 hours.   Radiology    No results found.  Cardiac Studies    Patient Profile     73 y.o. male w/ hx CAD,(s/p prior stenting of RCA and OM2 in 2006, s/p STEMI in 10/2017 with stenting of  mid-RCA and PDA with DES x2), post-operative atrial fibrillation (occurring in 11/2017 following hip surgery), HTN, HLD, and Type 2 DM. He was admitted 10/12 for RLE aortogram>>R-CFA endarterectomy w/ vein patch & R-CFA>post tib w/ composite PTFE greater saph vein. Cards saw 10/18 for Afib   Assessment & Plan    1  PAF   PT had a recurrent spell of afib today that was self limited   He did not sense   CHA2DS2=VASc = 4    ON heparin   Now and the should transition to NOAC   2   CAD  No symptoms of angina    For questions or updates, please contact [facility] Please consult [email or web address] for contact info under        Signed, Dorris Carnes, MD  07/10/2019, 11:19 AM

## 2019-07-10 NOTE — Progress Notes (Addendum)
  Progress Note    07/10/2019 7:58 AM 9 Days Post-Op  Subjective:  Sleeping-did not wake  Afebrile 80's-120's NSR/Afib [id number] systolic 0000000 RA  Vitals:   07/09/19 1952 07/10/19 0344  BP: (!) 98/51 121/70  Pulse: 99 100  Resp: 18 15  Temp: 97.7 F (36.5 C) 97.7 F (36.5 C)  SpO2: 96% 96%    Physical Exam: General:  No distress; sleeping comfortably Lungs:  Non labored  CBC    Component Value Date/Time   WBC 9.9 07/10/2019 0649   RBC 2.91 (L) 07/10/2019 0649   HGB 8.8 (L) 07/10/2019 0649   HCT 27.7 (L) 07/10/2019 0649   HCT 31.9 (L) 12/17/2017 0338   PLT 357 07/10/2019 0649   MCV 95.2 07/10/2019 0649   MCH 30.2 07/10/2019 0649   MCHC 31.8 07/10/2019 0649   RDW 14.6 07/10/2019 0649   LYMPHSABS 3.3 12/22/2017 0433   MONOABS 1.1 (H) 12/22/2017 0433   EOSABS 0.4 12/22/2017 0433   BASOSABS 0.1 12/22/2017 0433    BMET    Component Value Date/Time   NA 137 07/08/2019 1206   K 3.8 07/08/2019 1206   CL 107 07/08/2019 1206   CO2 23 07/08/2019 1206   GLUCOSE 94 07/08/2019 1206   BUN 14 07/08/2019 1206   CREATININE 1.36 (H) 07/08/2019 1206   CALCIUM 8.1 (L) 07/08/2019 1206   GFRNONAA 52 (L) 07/08/2019 1206   GFRAA >60 07/08/2019 1206    INR    Component Value Date/Time   INR 1.0 06/27/2019 1225     Intake/Output Summary (Last 24 hours) at 07/10/2019 0758 Last data filed at 07/10/2019 0348 Gross per 24 hour  Intake 220 ml  Output 900 ml  Net -680 ml     Assessment:  73 y.o. male is s/p:  R CFA endart, femoral to PTA composite bypass  9 Days Post-Op  Plan: -pt sleeping this am and did not wake him.  RN reports he had a better night last evening.    -he did have a run of afib this am-converted after BB per RN.  Remains on heparin.  Hgb stable.  Will start oral AC on day of discharge.  Cards recommending stopping asa and continuing Plavix/Eliquis at d/c.   ? Restart plavix now and dc asa.  Will d/w Dr. Oneida Alar -OT/PT recommending HHPT/OT.  Face  to face ordered.  Hopeful for discharge in the next day or two.     Leontine Locket, PA-C [facility] [phone] 07/10/2019 7:58 AM  Agree with above.  Mental status slowly clearing.  This is only obstacle to d/c   Bypass is patent  Incisions healing Wife updated by phone Plan is for Plavix Eliquis at home.  Ruta Hinds, MD [facility] [place] Office: [phone]

## 2019-07-11 DIAGNOSIS — I2510 Atherosclerotic heart disease of native coronary artery without angina pectoris: Secondary | ICD-10-CM | POA: Diagnosis not present

## 2019-07-11 LAB — CBC
HCT: 24.7 % — ABNORMAL LOW (ref 39.0–52.0)
Hemoglobin: 7.8 g/dL — ABNORMAL LOW (ref 13.0–17.0)
MCH: 29.7 pg (ref 26.0–34.0)
MCHC: 31.6 g/dL (ref 30.0–36.0)
MCV: 93.9 fL (ref 80.0–100.0)
Platelets: 338 10*3/uL (ref 150–400)
RBC: 2.63 MIL/uL — ABNORMAL LOW (ref 4.22–5.81)
RDW: 14.5 % (ref 11.5–15.5)
WBC: 9.4 10*3/uL (ref 4.0–10.5)
nRBC: 0 % (ref 0.0–0.2)

## 2019-07-11 LAB — GLUCOSE, CAPILLARY
Glucose-Capillary: 144 mg/dL — ABNORMAL HIGH (ref 70–99)
Glucose-Capillary: 130 mg/dL — ABNORMAL HIGH (ref 70–99)

## 2019-07-11 LAB — HEPARIN LEVEL (UNFRACTIONATED): Heparin Unfractionated: 0.53 IU/mL (ref 0.30–0.70)

## 2019-07-11 MED ORDER — APIXABAN 5 MG PO TABS: 5.0000 mg | ORAL_TABLET | Freq: Two times a day (BID) | ORAL | Status: DC

## 2019-07-11 MED ORDER — TRESIBA FLEXTOUCH 200 UNIT/ML ~~LOC~~ SOPN: 15.0000 [IU] | PEN_INJECTOR | Freq: Every day | SUBCUTANEOUS | Status: DC

## 2019-07-11 MED ORDER — CLOPIDOGREL BISULFATE 75 MG PO TABS
75.0000 mg | ORAL_TABLET | Freq: Every day | ORAL | Status: DC
  Administered 2019-07-11: 75 mg via ORAL
  Filled 2019-07-11: qty 1

## 2019-07-11 MED ORDER — APIXABAN 5 MG PO TABS: 5.0000 mg | ORAL_TABLET | Freq: Two times a day (BID) | ORAL | 0 refills | Status: DC

## 2019-07-11 MED FILL — ELIQUIS 5 MG TABLET: 5 | 30 days supply | Qty: 60 | Fill #0

## 2019-07-11 NOTE — Progress Notes (Signed)
Spoke with patient's wife.  She is visiting him at the hospital today.  She agrees that his mental status is improved but maybe not completely back to baseline.  She will visit with him some during the day today.  If she thinks his mental status is good enough to go home we will discharge him this evening.  Otherwise he may just need to stay for a few more days until his head clears.  He is on chronic oxycodone at home which is prescribed by Dr. Woody Seller.  He is not currently under a pain contract.  Ruta Hinds, MD [facility] [place] Office: [phone]

## 2019-07-11 NOTE — Progress Notes (Addendum)
  Progress Note    07/11/2019 7:24 AM 10 Days Post-Op  Subjective:  Wants to go home  Afebrile HR 70's-110's  [id number] systolic [id number] RA  Vitals:   07/11/19 0051 07/11/19 0407  BP: (!) 108/57 132/70  Pulse: 79 99  Resp: 17 14  Temp: 98 F (36.7 C) 98.6 F (37 C)  SpO2: 90%     Physical Exam: Cardiac:  regular Lungs:  nonlabored Incisions:  All incisions look good; ace wrap removed and the incisions look good.  There was only scant drainage on the bandage that was serous.   Extremities:  Brisk right PT doppler signal.   CBC    Component Value Date/Time   WBC 9.4 07/11/2019 0311   RBC 2.63 (L) 07/11/2019 0311   HGB 7.8 (L) 07/11/2019 0311   HCT 24.7 (L) 07/11/2019 0311   HCT 31.9 (L) 12/17/2017 0338   PLT 338 07/11/2019 0311   MCV 93.9 07/11/2019 0311   MCH 29.7 07/11/2019 0311   MCHC 31.6 07/11/2019 0311   RDW 14.5 07/11/2019 0311   LYMPHSABS 3.3 12/22/2017 0433   MONOABS 1.1 (H) 12/22/2017 0433   EOSABS 0.4 12/22/2017 0433   BASOSABS 0.1 12/22/2017 0433    BMET    Component Value Date/Time   NA 137 07/08/2019 1206   K 3.8 07/08/2019 1206   CL 107 07/08/2019 1206   CO2 23 07/08/2019 1206   GLUCOSE 94 07/08/2019 1206   BUN 14 07/08/2019 1206   CREATININE 1.36 (H) 07/08/2019 1206   CALCIUM 8.1 (L) 07/08/2019 1206   GFRNONAA 52 (L) 07/08/2019 1206   GFRAA >60 07/08/2019 1206    INR    Component Value Date/Time   INR 1.0 06/27/2019 1225     Intake/Output Summary (Last 24 hours) at 07/11/2019 0724 Last data filed at 07/11/2019 [id number] Gross per 24 hour  Intake 329 ml  Output -  Net 329 ml     Assessment:  73 y.o. male is s/p:  R CFA endart, femoral to PTA composite bypass  10 Days Post-Op  Plan: -brisk doppler signal right PT -ace wrap removed from RLE looks good.  There was scant drainage on the bandage.   -RN reports that pt still a little confused but had a better night and pt wants to go home.  -DVT prophylaxis:  Heparin gtt to  transition to Eliquis prior to discharge.  Will go ahead a start the plavix and dc asa per cardiology.  If dc today, will need f/u with PCP early next week to check labs-hgb in particular.   Hgb down today.  If not discharged today, check cbc in am.   -will ask DM coordinator to evaluate DM meds to see what he should be discharged home on.     Leontine Locket, PA-C [facility] [phone] 07/11/2019 7:24 AM  Agree with above.  Wife apparently is coming in to see pt today.  Will plan d/c today if she thinks mental status is clear enough for home  Right leg incisions healing, baseline edema Biphasic PT doppler  Ruta Hinds, MD [facility] [place] Office: [phone]

## 2019-07-11 NOTE — Progress Notes (Addendum)
ANTICOAGULATION CONSULT NOTE - [facility] for heparin to Apixaban Indication: atrial fibrillation  Allergies  Allergen Reactions  . Betadine [Povidone Iodine] Swelling    Facial swelling  . Fish Allergy Swelling    Facial swelling. Many years ago.  . Iodine Swelling    Facial swelling  . Shellfish Allergy Swelling    Facial swelling  . Sulfamethoxazole-Trimethoprim Swelling    Facial swelling  . Gabapentin Other (See Comments)    Unknown reaction  . Chlorhexidine   . Trimethoprim Swelling    Facial swelling - Doesn't think he ever took this by itself.  . Tramadol Other (See Comments)    Makes him crazy    Patient Measurements: Height: 6' (182.9 cm) Weight: 213 lb 3 oz (96.7 kg) IBW/kg (Calculated) : 77.6 Heparin Dosing Weight: 96.7 kg  Vital Signs: Temp: 98.6 F (37 C) (10/22 0407) Temp Source: Oral (10/22 0407) BP: 132/70 (10/22 0407) Pulse Rate: 99 (10/22 0407)  Labs: Recent Labs    07/08/19 1206 07/09/19 0409 07/10/19 0649 07/11/19 0311  HGB 8.6* 9.0* 8.8* 7.8*  HCT 25.5* 27.8* 27.7* 24.7*  PLT 344 370 357 338  HEPARINUNFRC 0.45 0.61 0.21* 0.53  CREATININE 1.36*  --   --   --     Estimated Creatinine Clearance: 60 mL/min (A) (by C-G formula based on SCr of 1.36 mg/dL (H)).    Assessment: 33 yom with a fib, not on anti-coagulation prior to admission S/p vascular procedure  Heparin level  therapeutic  CBC stable  Goal of Therapy:  Heparin level 0.3-0.7 units/ml Monitor platelets by anticoagulation protocol: Yes   Plan:  Continue heparin at 1750 units / hr Follow up AM labs Apixaban at discharge  PM: Eliquis 5 mg po BID, dc heparin and heparin labs  Thank you Anette Guarneri, PharmD  Please check AMION for all [place] numbers 07/11/2019

## 2019-07-11 NOTE — Progress Notes (Addendum)
Physical Therapy Treatment Patient Details Name: Dennis [name] MRN: [id number] DOB: [birth year] Today's Date: 07/11/2019    History of Present Illness Patient is a 73 y/o male who presents with BLE pain with nonhealing wounds on right foot, s/p Rt CFA endarterectomy fem to PTA composite bypass 10/12. PMH includes chronic pain, CAD, s/p PCI, DM, HTN, STEMI, Rt THA.    PT Comments    Patient seen for mobility progression. Pt is making progress toward PT goals although continues to appear  confused and did not recall previous PT session. Pt educated to keep working on R LE stretches and DF and to use RW upon d/c as he is less at risk for falls.  Pt will continue to benefit from further skilled PT services to maximize independence and safety with mobility.    Follow Up Recommendations  Home health PT;Supervision/Assistance - 24 hour     Equipment Recommendations  None recommended by PT    Recommendations for Other Services       Precautions / Restrictions Precautions Precautions: Fall;Back Precaution Comments: Back precautions for comfort due to severe chronic back pain Restrictions Weight Bearing Restrictions: Yes RLE Weight Bearing: Weight bearing as tolerated Other Position/Activity Restrictions: pts right leg has been tight for years - he has walked on toes for years    Mobility  Bed Mobility Overal bed mobility: Needs Assistance Bed Mobility: Rolling;Sidelying to Sit Rolling: Supervision Sidelying to sit: Min guard       General bed mobility comments: cues for sequencing for decreased pain with bed mobility; increased time and effort needed but no physical assist provided  Transfers Overall transfer level: Needs assistance Equipment used: Rolling walker (2 wheeled) Transfers: Sit to/from Stand Sit to Stand: Min guard;From elevated surface         General transfer comment: cues for hand placement  Ambulation/Gait Ambulation/Gait assistance: +2  safety/equipment;Min guard;Min assist(chair follow) Gait Distance (Feet): (240 ft total with seated rest break) Assistive device: Rolling walker (2 wheeled) Gait Pattern/deviations: Step-to pattern;Decreased stance time - right;Decreased step length - left;Decreased step length - right;Trunk flexed;Decreased dorsiflexion - right Gait velocity: decreased   General Gait Details: continue to encourage attempting R heel strike/foot flat; seated rest break due to R LE pain   Stairs             Wheelchair Mobility    Modified Rankin (Stroke Patients Only)       Balance Overall balance assessment: Needs assistance Sitting-balance support: Feet supported;No upper extremity supported Sitting balance-Leahy Scale: Fair     Standing balance support: Bilateral upper extremity supported;During functional activity Standing balance-Leahy Scale: Poor Standing balance comment: is able to static stand with single UE support                             Cognition Arousal/Alertness: Awake/alert Behavior During Therapy: WFL for tasks assessed/performed Overall Cognitive Status: Impaired/Different from baseline Area of Impairment: Following commands;Attention;Problem solving;Safety/judgement;Memory;Orientation                 Orientation Level: Disoriented to;Time;Place Current Attention Level: Selective Memory: Decreased short-term memory Following Commands: Follows multi-step commands with increased time Safety/Judgement: Decreased awareness of safety   Problem Solving: Slow processing;Requires verbal cues General Comments: pt did not recall last PT tx 2 days ago and reports we are "in the office"       Exercises General Exercises - Lower Extremity Ankle Circles/Pumps: AROM;AAROM;Right    General  Comments General comments (skin integrity, edema, etc.): small amount of blood noted while ambulating from R LE incision      Pertinent Vitals/Pain Pain Assessment:  Faces Faces Pain Scale: Hurts little more Pain Location: back and R LE  Pain Descriptors / Indicators: Grimacing;Guarding;Discomfort;Tightness Pain Intervention(s): Limited activity within patient's tolerance;Monitored during session;Repositioned    Home Living                      Prior Function            PT Goals (current goals can now be found in the care plan section) Progress towards PT goals: Progressing toward goals    Frequency    Min 3X/week      PT Plan Current plan remains appropriate    Co-evaluation              AM-PAC PT "6 Clicks" Mobility   Outcome Measure  Help needed turning from your back to your side while in a flat bed without using bedrails?: A Little Help needed moving from lying on your back to sitting on the side of a flat bed without using bedrails?: A Little Help needed moving to and from a bed to a chair (including a wheelchair)?: A Little Help needed standing up from a chair using your arms (e.g., wheelchair or bedside chair)?: A Little Help needed to walk in hospital room?: A Little Help needed climbing 3-5 steps with a railing? : A Lot 6 Click Score: 17    End of Session Equipment Utilized During Treatment: Gait belt Activity Tolerance: Patient tolerated treatment well Patient left: in chair;with call bell/phone within reach;with chair alarm set Nurse Communication: Mobility status PT Visit Diagnosis: Pain;Muscle weakness (generalized) (M62.81);Other abnormalities of gait and mobility (R26.89) Pain - Right/Left: Right Pain - part of body: Leg     Time: [id number] PT Time Calculation (min) (ACUTE ONLY): 30 min  Charges:  $Gait Training: 23-37 mins                     Earney Navy, PTA Acute Rehabilitation Services Pager: [phone] Office: [phone]     Darliss Cheney 07/11/2019, 10:32 AM

## 2019-07-11 NOTE — Progress Notes (Signed)
Pharmacy - Eliquis  For Afib  Transitioning from heparin  Scr stable  Plan: Eliquis 5 mg po BID Will send a prescription for 30 days to [place] for free supply with co-pay card  Thank you Anette Guarneri, PharmD

## 2019-07-11 NOTE — Progress Notes (Signed)
Results for COLBEN, DENNO (MRN [id number]) as of 07/11/2019 09:11  Ref. Range 07/10/2019 06:42 07/10/2019 12:07 07/10/2019 16:35 07/10/2019 21:05 07/11/2019 06:00  Glucose-Capillary Latest Ref Range: 70 - 99 mg/dL 150 (H) 163 (H) 107 (H) 113 (H) 144 (H)  Received diabetes coordinator consult. Recommend discharge with Tresiba 15 units daily and continue Metformin 850 mg daily. Will need to follow up with PCP for blood glucose control.    Harvel Ricks RN BSN CDE Diabetes Coordinator Pager: [phone]  8am-5pm
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
## 2019-07-11 NOTE — Progress Notes (Addendum)
Progress Note  Patient Name: Dennis [name] Date of Encounter: 07/11/2019  Primary Cardiologist: Carlyle Dolly, MD   Subjective   Denies CP  NO SOB   Inpatient Medications    Scheduled Meds: . clopidogrel  75 mg Oral Daily  . docusate sodium  100 mg Oral Daily  . insulin aspart  0-15 Units Subcutaneous TID WC  . metFORMIN  850 mg Oral Q breakfast  . metoprolol tartrate  25 mg Oral BID  . pantoprazole  40 mg Oral Daily  . rosuvastatin  20 mg Oral Daily  . silver sulfADIAZINE  1 application Topical Daily  . cyanocobalamin  1,000 mcg Oral Daily   Continuous Infusions: . heparin 1,750 Units/hr (07/11/19 0653)  . magnesium sulfate bolus IVPB     PRN Meds: acetaminophen **OR** acetaminophen, alum & mag hydroxide-simeth, bisacodyl, diazepam, furosemide, guaiFENesin-dextromethorphan, haloperidol lactate, hydrALAZINE, HYDROcodone-acetaminophen, labetalol, linaclotide, LORazepam, magnesium sulfate bolus IVPB, morphine injection, nitroGLYCERIN, ondansetron, phenol, polyethylene glycol, potassium chloride   Vital Signs    Vitals:   07/11/19 0051 07/11/19 0407 07/11/19 0925 07/11/19 1114  BP: (!) 108/57 132/70 121/60 122/66  Pulse: 79 99 (!) 105 95  Resp: 17 14 19 16   Temp: 98 F (36.7 C) 98.6 F (37 C) 97.8 F (36.6 C) (!) 97.5 F (36.4 C)  TempSrc: Oral Oral Oral Oral  SpO2: 90%  93% 96%  Weight:      Height:        Intake/Output Summary (Last 24 hours) at 07/11/2019 1127 Last data filed at 07/11/2019 0800 Gross per 24 hour  Intake 329 ml  Output -  Net 329 ml   Last 3 Weights 07/04/2019 07/01/2019 06/27/2019  Weight (lbs) 213 lb 3 oz 213 lb 3 oz 213 lb 4.8 oz  Weight (kg) 96.7 kg 96.7 kg 96.752 kg      Telemetry    SR and atiral fibrllation  Rates 90s   Personally Reviewed  ECG     - Personally Reviewed  Physical Exam   GEN: No acute distress.   Neck: JVP is normal   Cardiac: RRR, no murmurs, rubs, or gallops.  Respiratory: Clear to auscultation  bilaterally. GI: Soft, nontender, non-distended  MS: No edema;   Labs    High Sensitivity Troponin:  No results for input(s): TROPONINIHS in the last 720 hours.    Chemistry Recent Labs  Lab 07/07/19 1048 07/08/19 1206  NA 140 137  K 3.9 3.8  CL 111 107  CO2 19* 23  GLUCOSE 71 94  BUN 18 14  CREATININE 1.32* 1.36*  CALCIUM 7.9* 8.1*  GFRNONAA 54* 52*  GFRAA >60 >60  ANIONGAP 10 7     Hematology Recent Labs  Lab 07/09/19 0409 07/10/19 0649 07/11/19 0311  WBC 11.3* 9.9 9.4  RBC 3.02* 2.91* 2.63*  HGB 9.0* 8.8* 7.8*  HCT 27.8* 27.7* 24.7*  MCV 92.1 95.2 93.9  MCH 29.8 30.2 29.7  MCHC 32.4 31.8 31.6  RDW 14.3 14.6 14.5  PLT 370 357 338    BNPNo results for input(s): BNP, PROBNP in the last 168 hours.   DDimer No results for input(s): DDIMER in the last 168 hours.   Radiology    No results found.  Cardiac Studies    Patient Profile     73 y.o. male w/ hx CAD,(s/p prior stenting of RCA and OM2 in 2006, s/p STEMI in 10/2017 with stenting of mid-RCA and PDA with DES x2), post-operative atrial fibrillation (occurring in 11/2017 following hip  surgery), HTN, HLD, and Type 2 DM. He was admitted 10/12 for RLE aortogram>>R-CFA endarterectomy w/ vein patch & R-CFA>post tib w/ composite PTFE greater saph vein. Cards saw 10/18 for Afib   Assessment & Plan    1  PAF   PT continues to have intermitt atrial fib   Rates are not ba CHA2DS2=VASc = 4    Remm NOAC      2   CAD  No symptoms of angina    OK for D/C from cardiac standpoint   WIll make sure he has outpt follow up    For questions or updates, please contact [facility] Please consult [email or web address] for contact info under        Signed, Dorris Carnes, MD  07/11/2019, 11:27 AM

## 2019-07-12 LAB — BPAM RBC
Blood Product Expiration Date: 202010282359
Blood Product Expiration Date: 202011022359
Unit Type and Rh: 5100
Unit Type and Rh: 5100

## 2019-07-12 LAB — TYPE AND SCREEN
ABO/RH(D): O POS
Antibody Screen: POSITIVE
Donor AG Type: NEGATIVE
Donor AG Type: NEGATIVE
Unit division: 0
Unit division: 0

## 2019-07-17 ENCOUNTER — Telehealth: Payer: Self-pay | Admitting: *Deleted

## 2019-07-17 ENCOUNTER — Ambulatory Visit (INDEPENDENT_AMBULATORY_CARE_PROVIDER_SITE_OTHER): Payer: Self-pay | Admitting: Family

## 2019-07-17 ENCOUNTER — Encounter: Payer: Self-pay | Admitting: Family

## 2019-07-17 ENCOUNTER — Other Ambulatory Visit: Payer: Self-pay

## 2019-07-17 VITALS — BP 116/61 | HR 49 | Temp 97.6°F | Resp 22 | Ht 72.0 in | Wt 190.0 lb

## 2019-07-17 DIAGNOSIS — M7989 Other specified soft tissue disorders: Secondary | ICD-10-CM

## 2019-07-17 DIAGNOSIS — G8918 Other acute postprocedural pain: Secondary | ICD-10-CM

## 2019-07-17 DIAGNOSIS — I779 Disorder of arteries and arterioles, unspecified: Secondary | ICD-10-CM

## 2019-07-17 DIAGNOSIS — M79604 Pain in right leg: Secondary | ICD-10-CM

## 2019-07-17 NOTE — Telephone Encounter (Signed)
Call from patient's wife. Moderate to severe post-op pain with little relief from pain medication. Denies any fever or signs of wound infection and states extremities warm to touch. Scheduled to see NP today.
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
## 2019-07-17 NOTE — Patient Instructions (Addendum)
  To decrease swelling in your foot and leg: Elevate foot above slightly bent knee, feet above heart, overnight and 3-4 times per day for 20 minutes.    Peripheral Vascular Disease  Peripheral vascular disease (PVD) is a disease of the blood vessels that are not part of your heart and brain. A simple term for PVD is poor circulation. In most cases, PVD narrows the blood vessels that carry blood from your heart to the rest of your body. This can reduce the supply of blood to your arms, legs, and internal organs, like your stomach or kidneys. However, PVD most often affects a person's lower legs and feet. Without treatment, PVD tends to get worse. PVD can also lead to acute ischemic limb. This is when an arm or leg suddenly cannot get enough blood. This is a medical emergency. Follow these instructions at home: Lifestyle  Do not use any products that contain nicotine or tobacco, such as cigarettes and e-cigarettes. If you need help quitting, ask your doctor.  Lose weight if you are overweight. Or, stay at a healthy weight as told by your doctor.  Eat a diet that is low in fat and cholesterol. If you need help, ask your doctor.  Exercise regularly. Ask your doctor for activities that are right for you. General instructions  Take over-the-counter and prescription medicines only as told by your doctor.  Take good care of your feet: ? Wear comfortable shoes that fit well. ? Check your feet often for any cuts or sores.  Keep all follow-up visits as told by your doctor This is important. Contact a doctor if:  You have cramps in your legs when you walk.  You have leg pain when you are at rest.  You have coldness in a leg or foot.  Your skin changes.  You are unable to get or have an erection (erectile dysfunction).  You have cuts or sores on your feet that do not heal. Get help right away if:  Your arm or leg turns cold, numb, and blue.  Your arms or legs become red, warm,  swollen, painful, or numb.  You have chest pain.  You have trouble breathing.  You suddenly have weakness in your face, arm, or leg.  You become very confused or you cannot speak.  You suddenly have a very bad headache.  You suddenly cannot see. Summary  Peripheral vascular disease (PVD) is a disease of the blood vessels.  A simple term for PVD is poor circulation. Without treatment, PVD tends to get worse.  Treatment may include exercise, low fat and low cholesterol diet, and quitting smoking. This information is not intended to replace advice given to you by your health care provider. Make sure you discuss any questions you have with your health care provider. Document Released: 11/30/2009 Document Revised: 08/18/2017 Document Reviewed: 10/13/2016 Elsevier Patient Education  2020 [employer].

## 2019-07-22 ENCOUNTER — Encounter (HOSPITAL_COMMUNITY): Payer: Self-pay | Admitting: Emergency Medicine

## 2019-07-22 ENCOUNTER — Emergency Department (HOSPITAL_COMMUNITY): Payer: Medicare HMO

## 2019-07-22 ENCOUNTER — Emergency Department (HOSPITAL_COMMUNITY)
Admission: EM | Admit: 2019-07-22 | Discharge: 2019-07-22 | Disposition: A | Discharge status: Home / Self Care | Payer: Medicare HMO | Attending: Emergency Medicine | Admitting: Emergency Medicine

## 2019-07-22 ENCOUNTER — Other Ambulatory Visit: Payer: Self-pay

## 2019-07-22 DIAGNOSIS — Z7901 Long term (current) use of anticoagulants: Secondary | ICD-10-CM | POA: Insufficient documentation

## 2019-07-22 DIAGNOSIS — Z96641 Presence of right artificial hip joint: Secondary | ICD-10-CM | POA: Diagnosis not present

## 2019-07-22 DIAGNOSIS — Z955 Presence of coronary angioplasty implant and graft: Secondary | ICD-10-CM | POA: Diagnosis not present

## 2019-07-22 DIAGNOSIS — I480 Paroxysmal atrial fibrillation: Secondary | ICD-10-CM | POA: Insufficient documentation

## 2019-07-22 DIAGNOSIS — Z7984 Long term (current) use of oral hypoglycemic drugs: Secondary | ICD-10-CM | POA: Insufficient documentation

## 2019-07-22 DIAGNOSIS — I10 Essential (primary) hypertension: Secondary | ICD-10-CM | POA: Insufficient documentation

## 2019-07-22 DIAGNOSIS — E119 Type 2 diabetes mellitus without complications: Secondary | ICD-10-CM | POA: Diagnosis not present

## 2019-07-22 DIAGNOSIS — I259 Chronic ischemic heart disease, unspecified: Secondary | ICD-10-CM | POA: Insufficient documentation

## 2019-07-22 DIAGNOSIS — Z7902 Long term (current) use of antithrombotics/antiplatelets: Secondary | ICD-10-CM | POA: Insufficient documentation

## 2019-07-22 DIAGNOSIS — Z87891 Personal history of nicotine dependence: Secondary | ICD-10-CM | POA: Insufficient documentation

## 2019-07-22 DIAGNOSIS — L539 Erythematous condition, unspecified: Secondary | ICD-10-CM | POA: Diagnosis not present

## 2019-07-22 DIAGNOSIS — M79604 Pain in right leg: Secondary | ICD-10-CM | POA: Diagnosis present

## 2019-07-22 DIAGNOSIS — I739 Peripheral vascular disease, unspecified: Secondary | ICD-10-CM

## 2019-07-22 LAB — CBC WITH DIFFERENTIAL/PLATELET
Abs Immature Granulocytes: 0.02 10*3/uL (ref 0.00–0.07)
Basophils Absolute: 0.1 10*3/uL (ref 0.0–0.1)
Basophils Relative: 1 %
Eosinophils Absolute: 0.2 10*3/uL (ref 0.0–0.5)
Eosinophils Relative: 3 %
HCT: 32.4 % — ABNORMAL LOW (ref 39.0–52.0)
Hemoglobin: 10.1 g/dL — ABNORMAL LOW (ref 13.0–17.0)
Immature Granulocytes: 0 %
Lymphocytes Relative: 25 %
Lymphs Abs: 1.9 10*3/uL (ref 0.7–4.0)
MCH: 29.5 pg (ref 26.0–34.0)
MCHC: 31.2 g/dL (ref 30.0–36.0)
MCV: 94.7 fL (ref 80.0–100.0)
Monocytes Absolute: 0.5 10*3/uL (ref 0.1–1.0)
Monocytes Relative: 6 %
Neutro Abs: 5.1 10*3/uL (ref 1.7–7.7)
Neutrophils Relative %: 65 %
Platelets: 411 10*3/uL — ABNORMAL HIGH (ref 150–400)
RBC: 3.42 MIL/uL — ABNORMAL LOW (ref 4.22–5.81)
RDW: 13.9 % (ref 11.5–15.5)
WBC: 7.9 10*3/uL (ref 4.0–10.5)
nRBC: 0 % (ref 0.0–0.2)

## 2019-07-22 LAB — COMPREHENSIVE METABOLIC PANEL
ALT: 12 U/L (ref 0–44)
AST: 17 U/L (ref 15–41)
Albumin: 3 g/dL — ABNORMAL LOW (ref 3.5–5.0)
Alkaline Phosphatase: 75 U/L (ref 38–126)
Anion gap: 12 (ref 5–15)
BUN: 12 mg/dL (ref 8–23)
CO2: 22 mmol/L (ref 22–32)
Calcium: 9.1 mg/dL (ref 8.9–10.3)
Chloride: 104 mmol/L (ref 98–111)
Creatinine, Ser: 1.4 mg/dL — ABNORMAL HIGH (ref 0.61–1.24)
GFR calc Af Amer: 58 mL/min — ABNORMAL LOW (ref >60)
GFR calc non Af Amer: 50 mL/min — ABNORMAL LOW (ref >60)
Glucose, Bld: 133 mg/dL — ABNORMAL HIGH (ref 70–99)
Potassium: 4.1 mmol/L (ref 3.5–5.1)
Sodium: 138 mmol/L (ref 135–145)
Total Bilirubin: 0.4 mg/dL (ref 0.3–1.2)
Total Protein: 7.5 g/dL (ref 6.5–8.1)

## 2019-07-22 LAB — LACTIC ACID, PLASMA: Lactic Acid, Venous: 2.6 mmol/L (ref 0.5–1.9)

## 2019-07-22 MED ORDER — SODIUM CHLORIDE 0.9 % IV BOLUS
1000.0000 mL | Freq: Once | INTRAVENOUS | Status: AC
  Administered 2019-07-22: 18:00:00 1000 mL via INTRAVENOUS

## 2019-07-22 MED ORDER — SODIUM CHLORIDE 0.9% FLUSH
3.0000 mL | Freq: Once | INTRAVENOUS | Status: AC
  Administered 2019-07-22: 3 mL via INTRAVENOUS

## 2019-07-22 MED ORDER — HYDROCODONE-ACETAMINOPHEN 5-325 MG PO TABS: 1.0000 | ORAL_TABLET | Freq: Four times a day (QID) | ORAL | 0 refills | Status: DC | PRN

## 2019-07-22 MED ORDER — HYDROCODONE-ACETAMINOPHEN 5-325 MG PO TABS
1.0000 | ORAL_TABLET | Freq: Once | ORAL | Status: AC
  Administered 2019-07-22: 20:00:00 1 via ORAL
  Filled 2019-07-22: qty 1

## 2019-07-22 MED ORDER — CLINDAMYCIN PHOSPHATE 600 MG/50ML IV SOLN
600.0000 mg | Freq: Once | INTRAVENOUS | Status: AC
  Administered 2019-07-22: 600 mg via INTRAVENOUS
  Filled 2019-07-22: qty 50

## 2019-07-22 MED ORDER — MORPHINE SULFATE (PF) 4 MG/ML IV SOLN
4.0000 mg | Freq: Once | INTRAVENOUS | Status: AC
  Administered 2019-07-22: 4 mg via INTRAVENOUS
  Filled 2019-07-22: qty 1

## 2019-07-22 MED ORDER — HYDROMORPHONE HCL 1 MG/ML IJ SOLN
1.0000 mg | Freq: Once | INTRAMUSCULAR | Status: AC
  Administered 2019-07-22: 20:00:00 1 mg via INTRAVENOUS
  Filled 2019-07-22: qty 1

## 2019-07-22 NOTE — Consult Note (Signed)
Patient is a 73 year old male who is postoperative day #21 from a right femoral to posterior tibial artery bypass.  He apparently has been having difficulty walking at home and frequent falls.  He was sent to the ER today by Dr. Woody Seller.  There was concern for cellulitis in the leg as well.  The patient complains of pain primarily in his right patella region.  He has not had any fever or chills.  During his recent hospitalization he had considerable confusion in the hospital.  The patient's wife said this has improved but waxes and wanes some.  Physical exam:  Vitals:   07/22/19 1550 07/22/19 1634 07/22/19 1659  BP: (!) 162/93 (!) 165/71   Pulse: 89 78   Resp: 18 20   Temp: 97.9 F (36.6 C) 97.8 F (36.6 C)   TempSrc: Oral Oral   SpO2: 100% 100%   Weight:   86.2 kg  Height:   6' (1.829 m)    Right lower extremity: Healing incisions small area of erythema below the right groin incision consistent with early yeast with some moisture in the groin no significant drainage right leg incisions are all healing well the right below-knee incision has 1 spot of erythema about 1 x 1 cm in the midportion with a small amount of serous drainage.  There is no fluctuance.  He has edema in the right lower extremity which has been persistent from his recent hospitalization and is actually somewhat improved.  His incisions are all intact.  Right foot is pink and warm.  Data:  CBC    Component Value Date/Time   WBC 7.9 07/22/2019 1607   RBC 3.42 (L) 07/22/2019 1607   HGB 10.1 (L) 07/22/2019 1607   HCT 32.4 (L) 07/22/2019 1607   HCT 31.9 (L) 12/17/2017 0338   PLT 411 (H) 07/22/2019 1607   MCV 94.7 07/22/2019 1607   MCH 29.5 07/22/2019 1607   MCHC 31.2 07/22/2019 1607   RDW 13.9 07/22/2019 1607   LYMPHSABS 1.9 07/22/2019 1607   MONOABS 0.5 07/22/2019 1607   EOSABS 0.2 07/22/2019 1607   BASOSABS 0.1 07/22/2019 1607    BMET    Component Value Date/Time   NA 138 07/22/2019 1607   K 4.1 07/22/2019  1607   CL 104 07/22/2019 1607   CO2 22 07/22/2019 1607   GLUCOSE 133 (H) 07/22/2019 1607   BUN 12 07/22/2019 1607   CREATININE 1.40 (H) 07/22/2019 1607   CALCIUM 9.1 07/22/2019 1607   GFRNONAA 50 (L) 07/22/2019 1607   GFRAA 58 (L) 07/22/2019 1607   X-ray of the right hip tibia and fibula and ankle are all negative for fracture.  Assessment: Right leg pain and weakness.  Plan: I discussed with the patient possible admission for pain control and additional physical therapy and rehab.  I do not believe the leg requires antibiotics at this point.  He has 1 spot of erythema in the midportion of the below-knee incision but certainly does not represent widespread cellulitis.  He has no leukocytosis or fever.  His bypass graft clinically appears patent.  The patient currently does not wish hospital admission and states he would like to go home with oral pain medication and if the pain gets worse he would return for admission.  I discussed with him that I thought it would be in his best interest for hospital admission his wife also agreed but the patient is resistant to this idea.  My recommendation would be to send him home with  oral pain medication.  Again I do not believe he necessarily needs antibiotics at this point as he does not really have a significant cellulitis  He has follow-up scheduled with me on November 4.  Ruta Hinds, MD [facility] Office: [phone]

## 2019-07-22 NOTE — ED Notes (Signed)
Pt verbalized understanding of discharge instructions and denies any further questions at this time.   

## 2019-07-22 NOTE — ED Triage Notes (Signed)
Pt sent by PCP for cellulitis of the right leg, recurrent falls, and on going pain s/p surgery.

## 2019-07-22 NOTE — Discharge Instructions (Signed)
Take vicodin for severe pain   See Dr. Oneida Alar in 2 days as scheduled   Return to ER if you have worse pain or fever or purulent drainage

## 2019-07-22 NOTE — ED Provider Notes (Signed)
[facility] EMERGENCY DEPARTMENT Provider Note   CSN: [id number] Arrival date & time: 07/22/19  1533     History   Chief Complaint No chief complaint on file.   HPI Dennis [name] is a 73 y.o. male hx of CAD, PVD s/p right common femoral endarterectomy with profundoplasty, vein patch right common femoral artery, right common femoral to posterior tibial artery bypass with composite PTFE (propaten 6 mm), and reversed right greater saphenous vein on 07-01-19 by Dr. Oneida Alar for nonhealing wound right foot, here with fall, R hip and foot pain. Tripped over his walker several days ago and have R ankle and foot and hip pain. Saw PCP in [place] today and was thought to have cellulitis so sent for evaluation.      The history is provided by the patient.    Past Medical History:  Diagnosis Date  . Arthritis   . CAD (coronary artery disease)    a. s/p prior stenting of RCA and OM2 in 2006 b. s/p STEMI in 10/2017 with stenting of mid-RCA and PDA with DES x2  . Chronic pain   . Diabetes mellitus ([place])   . Hyperlipidemia   . Hypertension   . STEMI (ST elevation myocardial infarction) ([place])   . Tobacco use     Patient Active Problem List   Diagnosis Date Noted  . PAD (peripheral artery disease) ([place]) 07/01/2019  . Palpitations 01/23/2018  . Chronic anticoagulation 01/23/2018  . Chronic back pain 01/23/2018  . Atrial fibrillation with RVR ([place])   . PAF (paroxysmal atrial fibrillation) ([place]) 12/15/2017  . Delirium   . Closed right hip fracture ([place]) 12/09/2017  . CAD S/P percutaneous coronary angioplasty 12/04/2017  . Hypertension 12/04/2017  . Hyperlipidemia 11/15/2017  . Tobacco abuse 11/13/2017  . History of ST elevation myocardial infarction (STEMI) 11/13/2017  . Insulin dependent diabetes mellitus with complications 123456    Past Surgical History:  Procedure Laterality Date  . ABDOMINAL AORTOGRAM W/LOWER EXTREMITY Bilateral 06/07/2019   Procedure:  ABDOMINAL AORTOGRAM W/LOWER EXTREMITY;  Surgeon: Elam Dutch, MD;  Location: [facility];  Service: Cardiovascular;  Laterality: Bilateral;  . CORONARY STENT INTERVENTION N/A 11/13/2017   Procedure: CORONARY STENT INTERVENTION;  Surgeon: Martinique, Peter M, MD;  Location: [facility];  Service: Cardiovascular;  Laterality: N/A;  . CORONARY/GRAFT ACUTE MI REVASCULARIZATION N/A 11/13/2017   Procedure: Coronary/Graft Acute MI Revascularization;  Surgeon: Martinique, Peter M, MD;  Location: [facility];  Service: Cardiovascular;  Laterality: N/A;  . FEMORAL-TIBIAL BYPASS GRAFT Right 07/01/2019   Procedure: Right  FEMORAL- POSTERIOR TIBIAL ARTERY BYPASS GRAFT with Composite PTFE and Reversed Saphenous Vein, RIGHT Common FEMORAL ENDARTERECTOMY with Profundoplasty;  Surgeon: Elam Dutch, MD;  Location: [facility];  Service: Vascular;  Laterality: Right;  . LEFT HEART CATH AND CORONARY ANGIOGRAPHY N/A 11/13/2017   Procedure: LEFT HEART CATH AND CORONARY ANGIOGRAPHY;  Surgeon: Martinique, Peter M, MD;  Location: [facility];  Service: Cardiovascular;  Laterality: N/A;  . TOTAL HIP ARTHROPLASTY Right 12/18/2017   Procedure: TOTAL HIP ARTHROPLASTY ANTERIOR APPROACH;  Surgeon: Rod Can, MD;  Location: [facility];  Service: Orthopedics;  Laterality: Right;        Home Medications    Prior to Admission medications   Medication Sig Start Date End Date Taking? Authorizing Provider  acetaminophen (TYLENOL) 500 MG tablet Take 1,000 mg by mouth every 6 (six) hours as needed for mild pain or headache.    [provider]  apixaban (ELIQUIS) 5 MG TABS tablet Take 1 tablet (5 mg total) by mouth 2 (two) times daily. 07/11/19   Elam Dutch, MD  clopidogrel (PLAVIX) 75 MG tablet Take 75 mg by mouth daily.    [provider]  diazepam (VALIUM) 10 MG tablet Take 1 tablet by mouth at bedtime as needed for sleep.  06/14/18   [provider]  furosemide (LASIX) 20 MG  tablet Take 20 mg by mouth daily as needed for fluid (swelling).     [provider]  HYDROcodone-acetaminophen (NORCO/VICODIN) 5-325 MG tablet Take 1 tablet by mouth every 6 (six) hours as needed for moderate pain or severe pain. 12/25/17   Hongalgi, Lenis Dickinson, MD  linaclotide (LINZESS) 290 MCG CAPS capsule Take 290 mcg by mouth daily as needed (constipation).     [provider]  Menthol, Topical Analgesic, (ICY HOT BACK EX) Apply 1 application topically every 6 (six) hours as needed (pain).    [provider]  metFORMIN (GLUCOPHAGE) 850 MG tablet Take 850 mg by mouth daily with breakfast. 12/08/17   [provider]  metoprolol tartrate (LOPRESSOR) 25 MG tablet Take 1 tablet by mouth twice a day Patient taking differently: Take 25 mg by mouth 2 (two) times daily.  03/25/19   Arnoldo Lenis, MD  nitroGLYCERIN (NITROSTAT) 0.4 MG SL tablet Place 1 tablet (0.4 mg total) under the tongue every 5 (five) minutes x 3 doses as needed for chest pain. 11/15/17   Cheryln Manly, NP  rosuvastatin (CRESTOR) 20 MG tablet Take 1 tablet (20 mg total) by mouth daily. 05/22/19   Elam Dutch, MD  silver sulfADIAZINE (SILVADENE) 1 % cream Apply 1 application topically daily.    [provider]  TRESIBA FLEXTOUCH 200 UNIT/ML SOPN Inject 16 Units into the skin daily. 07/11/19   Rhyne, Hulen Shouts, PA-C  vitamin B-12 1000 MCG tablet Take 1 tablet (1,000 mcg total) by mouth daily. 12/26/17   Hongalgi, Lenis Dickinson, MD    Family History Family History  Problem Relation Age of Onset  . Hypertension Father   . Heart disease Father     Social History Social History   Tobacco Use  . Smoking status: Former Smoker    Types: Cigarettes    Start date: 10/25/2017    Quit date: 11/15/2017    Years since quitting: 1.6  . Smokeless tobacco: Never Used  . Tobacco comment: 2-3 cigarettes per year.  Substance Use Topics  . Alcohol use: No    Frequency: Never  . Drug use: No      Allergies   Betadine [povidone iodine], Fish allergy, Iodine, Shellfish allergy, Sulfamethoxazole-trimethoprim, Gabapentin, Chlorhexidine, Trimethoprim, and Tramadol   Review of Systems Review of Systems  Skin: Positive for color change and wound.  All other systems reviewed and are negative.    Physical Exam Updated Vital Signs BP (!) 165/71 (BP Location: Right Arm)   Pulse 78   Temp 97.8 F (36.6 C) (Oral)   Resp 20   Ht 6' (1.829 m)   Wt 86.2 kg   SpO2 100%   BMI 25.77 kg/m   Physical Exam Vitals signs and nursing note reviewed.  HENT:     Head: Normocephalic.     Nose: Nose normal.     Mouth/Throat:     Mouth: Mucous membranes are moist.  Eyes:     Extraocular Movements: Extraocular movements intact.     Pupils: Pupils are equal, round, and reactive  to light.  Neck:     Musculoskeletal: Normal range of motion.  Cardiovascular:     Rate and Rhythm: Normal rate.     Pulses: Normal pulses.  Pulmonary:     Effort: Pulmonary effort is normal.  Abdominal:     General: Abdomen is flat.  Musculoskeletal:     Comments: R common femoral bypass with mild erythema but no obvious purulent drainage, 1+ pulses R DP, dopplerable pulses L DP. Mild erythema R distal tibia and ankle   Skin:    Capillary Refill: Capillary refill takes less than 2 seconds.     Findings: Erythema present.  Neurological:     General: No focal deficit present.     Mental Status: He is alert.  Psychiatric:        Mood and Affect: Mood normal.        Behavior: Behavior normal.      ED Treatments / Results  Labs (all labs ordered are listed, but only abnormal results are displayed) Labs Reviewed  COMPREHENSIVE METABOLIC PANEL - Abnormal; Notable for the following components:      Result Value   Glucose, Bld 133 (*)    Creatinine, Ser 1.40 (*)    Albumin 3.0 (*)    GFR calc non Af Amer 50 (*)    GFR calc Af Amer 58 (*)    All other components within normal limits  CBC WITH  DIFFERENTIAL/PLATELET - Abnormal; Notable for the following components:   RBC 3.42 (*)    Hemoglobin 10.1 (*)    HCT 32.4 (*)    Platelets 411 (*)    All other components within normal limits  LACTIC ACID, PLASMA  LACTIC ACID, PLASMA    EKG None  Radiology No results found.  Procedures Procedures (including critical care time)  Medications Ordered in ED Medications  sodium chloride flush (NS) 0.9 % injection 3 mL (has no administration in time range)  clindamycin (CLEOCIN) IVPB 600 mg (has no administration in time range)  sodium chloride 0.9 % bolus 1,000 mL (has no administration in time range)     Initial Impression / Assessment and Plan / ED Course  I have reviewed the triage vital signs and the nursing notes.  Pertinent labs & imaging results that were available during my care of the patient were reviewed by me and considered in my medical decision making (see chart for details).       Dennis [name] is a 73 y.o. male here with R leg pain and redness, recurrent falls. He had recent bypass surgery. Has better pulses on the right then the left. Will get xrays to r/o fracture. Will consult vascular surgery   8:21 PM WBC is normal. Lactate minimally elevated at 2.6. Dr. Oneida Alar from vascular saw patient. Offered admission for pain control but he refused and requesting oral pain meds. Dr. Oneida Alar doesn't think patient need abx at this time. Elevated lactate likely from his PAD. He has follow up with Dr. Oneida Alar in 2 days.    Final Clinical Impressions(s) / ED Diagnoses   Final diagnoses:  None    ED Discharge Orders    None       Drenda Freeze, MD 07/22/19 2022

## 2019-07-24 ENCOUNTER — Ambulatory Visit (INDEPENDENT_AMBULATORY_CARE_PROVIDER_SITE_OTHER): Payer: Self-pay | Admitting: Vascular Surgery

## 2019-07-24 ENCOUNTER — Other Ambulatory Visit: Payer: Self-pay

## 2019-07-24 ENCOUNTER — Encounter: Payer: Self-pay | Admitting: Vascular Surgery

## 2019-07-24 VITALS — BP 128/66 | HR 90 | Resp 18 | Ht 72.0 in | Wt 190.0 lb

## 2019-07-24 DIAGNOSIS — I739 Peripheral vascular disease, unspecified: Secondary | ICD-10-CM

## 2019-07-24 NOTE — Progress Notes (Signed)
Patient is a 73 year old male who returns for postoperative follow-up today.  He previously underwent right common femoral endarterectomy with profundoplasty and right femoral to posterior tibial artery bypass with prosthetic and distal vein patch.  This was on July 01, 2019.  He was seen in the emergency room at [facility] 2 days ago.  He was primarily complaining of right leg pain.  He returns today continuing to complain of right leg pain.  He has had problems with chronic pain issues in the past.  Patient states that his entire leg hurts from the right hip all the way down.  Physical exam:  Vitals:   07/24/19 1106  BP: 128/66  Pulse: 90  Resp: 18  SpO2: 100%  Weight: 190 lb (86.2 kg)  Height: 6' (1.829 m)    Right lower extremity: Healing incisions the right groin incision is improved from 2 days ago.  There is less erythema.  There is no drainage.  There is 1 spot of serous drainage from the central portion of the right below-knee incision.  There is still some edema in the right leg.  Right foot is pink and warm with Doppler signals.  Ulcer tip of right first toe is slowly healing.  This was the indication for his initial operation.  Assessment: Right leg pain status post right femoral endarterectomy with posterior tibial artery bypass in a patient with chronic pain now with exacerbation of this.  Plan: We will try to see if we can get the patient in with physical therapy to try to increase his mobility.  The patient will need ongoing continued pain medication at least for a few more weeks.  The patient will follow-up in 2 weeks with bilateral ABIs and a graft duplex scan at that time.  Ruta Hinds, MD [facility] Office: [phone]

## 2019-07-25 ENCOUNTER — Other Ambulatory Visit: Payer: Self-pay

## 2019-07-25 ENCOUNTER — Ambulatory Visit: Payer: Medicare HMO | Admitting: Cardiology

## 2019-07-25 DIAGNOSIS — I739 Peripheral vascular disease, unspecified: Secondary | ICD-10-CM

## 2019-08-07 ENCOUNTER — Telehealth (HOSPITAL_COMMUNITY): Payer: Self-pay

## 2019-08-07 NOTE — Telephone Encounter (Signed)

## 2019-08-08 ENCOUNTER — Other Ambulatory Visit: Payer: Self-pay

## 2019-08-08 ENCOUNTER — Ambulatory Visit (HOSPITAL_COMMUNITY)
Admission: RE | Admit: 2019-08-08 | Discharge: 2019-08-08 | Disposition: A | Discharge status: Home / Self Care | Payer: Medicare HMO | Source: Ambulatory Visit | Attending: Vascular Surgery | Admitting: Vascular Surgery

## 2019-08-08 ENCOUNTER — Ambulatory Visit (INDEPENDENT_AMBULATORY_CARE_PROVIDER_SITE_OTHER): Payer: Self-pay | Admitting: Vascular Surgery

## 2019-08-08 ENCOUNTER — Ambulatory Visit (INDEPENDENT_AMBULATORY_CARE_PROVIDER_SITE_OTHER)
Admission: RE | Admit: 2019-08-08 | Discharge: 2019-08-08 | Disposition: A | Discharge status: Home / Self Care | Payer: Medicare HMO | Source: Ambulatory Visit | Attending: Vascular Surgery | Admitting: Vascular Surgery

## 2019-08-08 ENCOUNTER — Encounter: Payer: Self-pay | Admitting: Vascular Surgery

## 2019-08-08 VITALS — BP 133/62 | HR 64 | Temp 97.2°F | Resp 20 | Ht 72.0 in | Wt 206.0 lb

## 2019-08-08 DIAGNOSIS — I739 Peripheral vascular disease, unspecified: Secondary | ICD-10-CM
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
## 2019-08-22 ENCOUNTER — Other Ambulatory Visit: Payer: Self-pay

## 2019-08-22 DIAGNOSIS — I739 Peripheral vascular disease, unspecified: Secondary | ICD-10-CM

## 2019-09-02 ENCOUNTER — Telehealth: Payer: Self-pay | Admitting: *Deleted

## 2019-09-02 NOTE — Telephone Encounter (Signed)
Call with c/o continued draining at right leg incision. Doing daily cleaning and dressing changes. State right calf wound is hard. Denies any fever or chills. Appt given to see NP on [facility] 09/04/2019.

## 2019-09-04 ENCOUNTER — Ambulatory Visit: Payer: Medicare HMO | Admitting: Family

## 2019-09-06 ENCOUNTER — Other Ambulatory Visit: Payer: Self-pay | Admitting: Vascular Surgery

## 2019-09-06 ENCOUNTER — Ambulatory Visit (INDEPENDENT_AMBULATORY_CARE_PROVIDER_SITE_OTHER): Payer: Self-pay | Admitting: Family

## 2019-09-06 ENCOUNTER — Encounter: Payer: Self-pay | Admitting: Family

## 2019-09-06 ENCOUNTER — Other Ambulatory Visit: Payer: Self-pay

## 2019-09-06 VITALS — BP 149/78 | HR 70 | Temp 97.5°F | Resp 16 | Ht 72.0 in | Wt 202.0 lb

## 2019-09-06 DIAGNOSIS — T8131XA Disruption of external operation (surgical) wound, not elsewhere classified, initial encounter: Secondary | ICD-10-CM

## 2019-09-06 DIAGNOSIS — I779 Disorder of arteries and arterioles, unspecified: Secondary | ICD-10-CM

## 2019-09-06 MED ORDER — CEPHALEXIN 500 MG PO CAPS: 500.0000 mg | ORAL_CAPSULE | Freq: Four times a day (QID) | ORAL | 0 refills | Status: DC
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
## 2019-09-06 NOTE — Patient Instructions (Signed)
Peripheral Vascular Disease  Peripheral vascular disease (PVD) is a disease of the blood vessels that are not part of your heart and brain. A simple term for PVD is poor circulation. In most cases, PVD narrows the blood vessels that carry blood from your heart to the rest of your body. This can reduce the supply of blood to your arms, legs, and internal organs, like your stomach or kidneys. However, PVD most often affects a person's lower legs and feet. Without treatment, PVD tends to get worse. PVD can also lead to acute ischemic limb. This is when an arm or leg suddenly cannot get enough blood. This is a medical emergency. Follow these instructions at home: Lifestyle  Do not use any products that contain nicotine or tobacco, such as cigarettes and e-cigarettes. If you need help quitting, ask your doctor.  Lose weight if you are overweight. Or, stay at a healthy weight as told by your doctor.  Eat a diet that is low in fat and cholesterol. If you need help, ask your doctor.  Exercise regularly. Ask your doctor for activities that are right for you. General instructions  Take over-the-counter and prescription medicines only as told by your doctor.  Take good care of your feet: ? Wear comfortable shoes that fit well. ? Check your feet often for any cuts or sores.  Keep all follow-up visits as told by your doctor This is important. Contact a doctor if:  You have cramps in your legs when you walk.  You have leg pain when you are at rest.  You have coldness in a leg or foot.  Your skin changes.  You are unable to get or have an erection (erectile dysfunction).  You have cuts or sores on your feet that do not heal. Get help right away if:  Your arm or leg turns cold, numb, and blue.  Your arms or legs become red, warm, swollen, painful, or numb.  You have chest pain.  You have trouble breathing.  You suddenly have weakness in your face, arm, or leg.  You become very  confused or you cannot speak.  You suddenly have a very bad headache.  You suddenly cannot see. Summary  Peripheral vascular disease (PVD) is a disease of the blood vessels.  A simple term for PVD is poor circulation. Without treatment, PVD tends to get worse.  Treatment may include exercise, low fat and low cholesterol diet, and quitting smoking. This information is not intended to replace advice given to you by your health care provider. Make sure you discuss any questions you have with your health care provider. Document Released: 11/30/2009 Document Revised: 08/18/2017 Document Reviewed: 10/13/2016 Elsevier Patient Education  2020 Elsevier Inc.  

## 2019-09-12 ENCOUNTER — Ambulatory Visit (INDEPENDENT_AMBULATORY_CARE_PROVIDER_SITE_OTHER): Payer: Self-pay | Admitting: Physician Assistant

## 2019-09-12 ENCOUNTER — Other Ambulatory Visit: Payer: Self-pay

## 2019-09-12 VITALS — BP 124/70 | HR 69 | Temp 97.6°F | Resp 20 | Ht 72.0 in | Wt 205.8 lb

## 2019-09-12 DIAGNOSIS — I739 Peripheral vascular disease, unspecified: Secondary | ICD-10-CM

## 2019-09-12 MED ORDER — HYDROCODONE-ACETAMINOPHEN 5-325 MG PO TABS: 1.0000 | ORAL_TABLET | ORAL | 0 refills | Status: DC | PRN

## 2019-09-12 NOTE — Progress Notes (Signed)
    Postoperative Visit   History of Present Illness   Dennis [name] is a 73 y.o. year old male who presents for postoperative follow-up for: Right common femoral endarterectomy with vein patch angioplasty and right femoral to PT bypass with composite PTFE by Dr. Oneida Alar on 07/01/2019.  Patient continues to have trouble with pain, edema. and slow to heal popliteal incision.  He has pain in his right leg however thinks this is related to his edema.  He denies rest pain at night.  He also denies any systemic symptoms including fever, chills, nausea/vomiting.  Right great toe ulceration nearly healed.  Patient states he tries to elevate his leg in his recliner on top of several pillows however is only doing this for an hour a day.  He continues to take Keflex which was prescribed at his office visit last week with NP.  He is on Eliquis and Plavix daily.     For VQI Use Only   PRE-ADM LIVING: Home  AMB STATUS: Ambulatory with Assistance   Physical Examination   Vitals:   09/12/19 0909  BP: 124/70  Pulse: 69  Resp: 20  Temp: 97.6 F (36.4 C)  TempSrc: Oral  SpO2: 99%  Weight: 205 lb 12.8 oz (93.4 kg)  Height: 6' (1.829 m)    RLE: Groin incision healed; vein harvest incisions healed; distal pole of below the knee popliteal incision with 1 x 2 cm opening with serous drainage, cracking skin of shin and foot suggesting coming and going edema; brisk PT and DP signal by Doppler   Medical Decision Making   Dennis [name] is a 73 y.o. year old male who presents s/p right common femoral endarterectomy and composite femoral to PTA bypass about 10 weeks postop.  Marland Kitchen Patent right lower extremity bypass with brisk PT and DP signals by Doppler . Right great toe ulceration nearly healed . Slow to heal below the knee popliteal incision with small opening at the distal pole likely complicated by ongoing edema and thin serous drainage . No frank signs of infection of bypass at this point; continue  Keflex prescription to completion; educated patient on signs and symptoms to watch for . Continue daily cleansing and dry dressing changes as needed . Encouraged patient to elevate his leg when possible during the day . We will likely need to keep a close eye on patient until incision is completely healed; he will follow-up in another 2 weeks for wound check . Norco prescription provided for ongoing postoperative pain control   Dagoberto Ligas PA-C [facility] [place] Office: [phone]  Clinic MD: Scot Dock

## 2019-09-24 ENCOUNTER — Telehealth: Payer: Self-pay

## 2019-09-24 NOTE — Telephone Encounter (Signed)
FYI:  Per Arbie Cookey, RN with [facility] in [place], [place], patient has 2 open places on the side of his leg that resemble venous statis ulcers.  She treated these places with Archie Balboa with Silver on Fri 09/20/19.  Thurston Hole., LPN

## 2019-09-26 ENCOUNTER — Other Ambulatory Visit (HOSPITAL_COMMUNITY): Payer: Medicare HMO

## 2019-09-26 ENCOUNTER — Encounter: Payer: Self-pay | Admitting: Physician Assistant

## 2019-09-26 ENCOUNTER — Other Ambulatory Visit: Payer: Self-pay

## 2019-09-26 ENCOUNTER — Ambulatory Visit (INDEPENDENT_AMBULATORY_CARE_PROVIDER_SITE_OTHER): Payer: Medicare HMO | Admitting: Physician Assistant

## 2019-09-26 DIAGNOSIS — I739 Peripheral vascular disease, unspecified: Secondary | ICD-10-CM

## 2019-09-26 DIAGNOSIS — T8131XA Disruption of external operation (surgical) wound, not elsewhere classified, initial encounter: Secondary | ICD-10-CM

## 2019-09-26 NOTE — Progress Notes (Signed)
POST OPERATIVE OFFICE NOTE    CC:  F/u for surgery  HPI:  This is a 75 y.o. male who is s/p Right common femoral endarterectomy with vein patch angioplasty and right femoral to PT bypass with composite PTFE by Dr. Oneida Alar on 07/01/2019.  He has had LE edema and slow wound healing at the below knee popliteal incision.  He was placed on Keflex for erythema on 09/06/19 for 10 days.   He denise pain at rest, fevers or chills.  He continues to take daily Eliquis and Plavix.    His HH RN called to report 2 open areas she initiated treatment with Archie Balboa with Silver on Fri 09/20/19 and he was scheduled for a visit to evaluate the new wounds in question.    Allergies  Allergen Reactions  . Betadine [Povidone Iodine] Swelling    Facial swelling  . Fish Allergy Swelling    Facial swelling. Many years ago.  . Iodine Swelling    Facial swelling  . Shellfish Allergy Swelling    Facial swelling  . Sulfamethoxazole-Trimethoprim Swelling    Facial swelling  . Gabapentin Other (See Comments)    Unknown reaction  . Chlorhexidine   . Trimethoprim Swelling    Facial swelling - Doesn't think he ever took this by itself.  . Tramadol Other (See Comments)    Makes him crazy    Current Outpatient Medications  Medication Sig Dispense Refill  . acetaminophen (TYLENOL) 500 MG tablet Take 1,000 mg by mouth every 6 (six) hours as needed for mild pain or headache.    Marland Kitchen apixaban (ELIQUIS) 5 MG TABS tablet Take 1 tablet (5 mg total) by mouth 2 (two) times daily. 60 tablet 0  . cephALEXin (KEFLEX) 500 MG capsule Take 1 capsule (500 mg total) by mouth 4 (four) times daily. 40 capsule 0  . clopidogrel (PLAVIX) 75 MG tablet Take 75 mg by mouth daily.    . diazepam (VALIUM) 10 MG tablet Take 1 tablet by mouth at bedtime as needed for sleep.     . furosemide (LASIX) 20 MG tablet Take 20 mg by mouth daily as needed for fluid (swelling).     Marland Kitchen HYDROcodone-acetaminophen (NORCO) 5-325 MG tablet Take 1 tablet by mouth  every 4 (four) hours as needed for moderate pain. 30 tablet 0  . ibuprofen (ADVIL) 400 MG tablet Take 400 mg by mouth every 4 (four) hours as needed.    . linaclotide (LINZESS) 290 MCG CAPS capsule Take 290 mcg by mouth daily as needed (constipation).     . Menthol, Topical Analgesic, (ICY HOT BACK EX) Apply 1 application topically every 6 (six) hours as needed (pain).    . metFORMIN (GLUCOPHAGE) 850 MG tablet Take 850 mg by mouth daily with breakfast.    . metoprolol tartrate (LOPRESSOR) 25 MG tablet Take 1 tablet by mouth twice a day (Patient taking differently: Take 25 mg by mouth 2 (two) times daily. ) 180 tablet 2  . nitroGLYCERIN (NITROSTAT) 0.4 MG SL tablet Place 1 tablet (0.4 mg total) under the tongue every 5 (five) minutes x 3 doses as needed for chest pain. 25 tablet 2  . rosuvastatin (CRESTOR) 20 MG tablet Take 1 tablet (20 mg total) by mouth daily. 30 tablet 12  . silver sulfADIAZINE (SILVADENE) 1 % cream Apply 1 application topically daily.    . TRESIBA FLEXTOUCH 200 UNIT/ML SOPN Inject 16 Units into the skin daily.    . vitamin B-12 1000 MCG tablet Take 1  tablet (1,000 mcg total) by mouth daily.     No current facility-administered medications for this visit.     ROS:  See HPI  Physical Exam:    Incision:  Right medial lower leg incision is smaller without surrounding erythema Right groin incision fully healed    Superior wound 1.5 cm and distal 0.7 cm with scab  Extremities:  Doppler signals DP/PT/Peroneal intact.  Min to mod edema. No erythema.  Dry skin.  Sensation intact to light touch, active motion of toes intact. Lungs: non labored breathing   Assessment/Plan:  This is a 74 y.o. male who is s/p: Right common femoral endarterectomy with vein patch angioplasty and right femoral to PT bypass with composite PTFE by Dr. Oneida Alar on 07/01/2019.   Right below knee popliteal wound is healing slowly without fluid collection/drainage to palpation of calf.  He is elevating  his leg more often.    I want the [facility] RN to continue wound care with Archie Balboa with Silver and dry guaze daily.  He will return in February for repeat ABI and Bypass duplex and wound check.  If he develope erythema, fever, chills, increased size of wound he will call sooner.     Roxy Horseman PA-C [facility] [phone]  Clinic MD:  Oneida Alar

## 2019-10-09 ENCOUNTER — Telehealth: Payer: Self-pay

## 2019-10-09 NOTE — Telephone Encounter (Signed)
Arbie Cookey with [facility] called and left voice message stating she will be discharging this patient from her services.  His wounds are healing well and she was able to teach patient's wife how to do his dressings.  Patient has follow up appt with our office in Feb.  Thurston Hole., LPN

## 2019-11-08 ENCOUNTER — Other Ambulatory Visit: Payer: Self-pay | Admitting: Cardiology

## 2019-11-13 ENCOUNTER — Telehealth (HOSPITAL_COMMUNITY): Payer: Self-pay

## 2019-11-13 NOTE — Telephone Encounter (Signed)

## 2019-11-14 ENCOUNTER — Ambulatory Visit (HOSPITAL_COMMUNITY)
Admission: RE | Admit: 2019-11-14 | Discharge: 2019-11-14 | Disposition: A | Discharge status: Home / Self Care | Payer: Medicare HMO | Source: Ambulatory Visit | Attending: Vascular Surgery | Admitting: Vascular Surgery

## 2019-11-14 ENCOUNTER — Ambulatory Visit: Payer: Medicare HMO | Admitting: Vascular Surgery

## 2019-11-14 ENCOUNTER — Ambulatory Visit (INDEPENDENT_AMBULATORY_CARE_PROVIDER_SITE_OTHER)
Admission: RE | Admit: 2019-11-14 | Discharge: 2019-11-14 | Disposition: A | Discharge status: Home / Self Care | Payer: Medicare HMO | Source: Ambulatory Visit | Attending: Vascular Surgery | Admitting: Vascular Surgery

## 2019-11-14 ENCOUNTER — Ambulatory Visit (INDEPENDENT_AMBULATORY_CARE_PROVIDER_SITE_OTHER): Payer: Medicare HMO | Admitting: Physician Assistant

## 2019-11-14 ENCOUNTER — Other Ambulatory Visit: Payer: Self-pay

## 2019-11-14 VITALS — BP 115/67 | HR 68 | Temp 98.1°F | Resp 16 | Ht 72.0 in | Wt 205.0 lb

## 2019-11-14 DIAGNOSIS — I739 Peripheral vascular disease, unspecified: Secondary | ICD-10-CM | POA: Diagnosis not present
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
## 2019-11-15 ENCOUNTER — Other Ambulatory Visit: Payer: Self-pay | Admitting: *Deleted

## 2019-11-15 DIAGNOSIS — I739 Peripheral vascular disease, unspecified: Secondary | ICD-10-CM

## 2019-11-15 DIAGNOSIS — I779 Disorder of arteries and arterioles, unspecified: Secondary | ICD-10-CM

## 2019-12-24 ENCOUNTER — Emergency Department (HOSPITAL_COMMUNITY)
Admission: EM | Admit: 2019-12-24 | Discharge: 2019-12-24 | Disposition: A | Discharge status: Home / Self Care | Payer: Medicare HMO | Attending: Emergency Medicine | Admitting: Emergency Medicine

## 2019-12-24 ENCOUNTER — Emergency Department (HOSPITAL_COMMUNITY): Payer: Medicare HMO

## 2019-12-24 ENCOUNTER — Encounter (HOSPITAL_COMMUNITY): Payer: Self-pay | Admitting: Emergency Medicine

## 2019-12-24 DIAGNOSIS — Z955 Presence of coronary angioplasty implant and graft: Secondary | ICD-10-CM | POA: Diagnosis not present

## 2019-12-24 DIAGNOSIS — I10 Essential (primary) hypertension: Secondary | ICD-10-CM | POA: Diagnosis not present

## 2019-12-24 DIAGNOSIS — Y929 Unspecified place or not applicable: Secondary | ICD-10-CM | POA: Insufficient documentation

## 2019-12-24 DIAGNOSIS — I2510 Atherosclerotic heart disease of native coronary artery without angina pectoris: Secondary | ICD-10-CM | POA: Diagnosis not present

## 2019-12-24 DIAGNOSIS — E119 Type 2 diabetes mellitus without complications: Secondary | ICD-10-CM | POA: Insufficient documentation

## 2019-12-24 DIAGNOSIS — Y999 Unspecified external cause status: Secondary | ICD-10-CM | POA: Diagnosis not present

## 2019-12-24 DIAGNOSIS — W1789XA Other fall from one level to another, initial encounter: Secondary | ICD-10-CM | POA: Diagnosis not present

## 2019-12-24 DIAGNOSIS — Z96641 Presence of right artificial hip joint: Secondary | ICD-10-CM | POA: Insufficient documentation

## 2019-12-24 DIAGNOSIS — W19XXXA Unspecified fall, initial encounter: Secondary | ICD-10-CM

## 2019-12-24 DIAGNOSIS — Z87891 Personal history of nicotine dependence: Secondary | ICD-10-CM | POA: Insufficient documentation

## 2019-12-24 DIAGNOSIS — M546 Pain in thoracic spine: Secondary | ICD-10-CM | POA: Insufficient documentation

## 2019-12-24 DIAGNOSIS — Z7901 Long term (current) use of anticoagulants: Secondary | ICD-10-CM | POA: Insufficient documentation

## 2019-12-24 DIAGNOSIS — Y939 Activity, unspecified: Secondary | ICD-10-CM | POA: Diagnosis not present

## 2019-12-24 DIAGNOSIS — M542 Cervicalgia: Secondary | ICD-10-CM | POA: Insufficient documentation

## 2019-12-24 DIAGNOSIS — R519 Headache, unspecified: Secondary | ICD-10-CM | POA: Diagnosis not present

## 2019-12-24 MED ORDER — KETOROLAC TROMETHAMINE 15 MG/ML IJ SOLN: 15.0000 mg | Freq: Once | INTRAMUSCULAR | Status: DC

## 2019-12-24 MED ORDER — METHOCARBAMOL 500 MG PO TABS: 500.0000 mg | ORAL_TABLET | Freq: Three times a day (TID) | ORAL | 0 refills | Status: DC | PRN

## 2019-12-24 MED ORDER — ACETAMINOPHEN 500 MG PO TABS
1000.0000 mg | ORAL_TABLET | Freq: Once | ORAL | Status: AC
  Administered 2019-12-24: 12:00:00 1000 mg via ORAL
  Filled 2019-12-24: qty 2

## 2019-12-24 NOTE — ED Provider Notes (Signed)
[facility] Emergency Department Provider Note MRN:  [id number]  Arrival date & time: 12/24/19     Chief Complaint   Fall   History of Present Illness   Dennis [name] is a 74 y.o. year-old male with a history of CAD, diabetes presenting to the ED with chief complaint of fall.  Fell backwards 2 or 3 days ago, fell from a height of a few feet high.  Head trauma, no loss of consciousness.  Had some headache and nausea yesterday but not today.  Some neck stiffness yesterday but not today.  Complaining of continued severe thoracic midline back pain.  Denies numbness or weakness to the arms or legs, no bowel or bladder dysfunction, no chest pain or shortness of breath, no abdominal pain.  Review of Systems  A complete 10 system review of systems was obtained and all systems are negative except as noted in the HPI and PMH.   Patient's Health History    Past Medical History:  Diagnosis Date  . Arthritis   . CAD (coronary artery disease)    a. s/p prior stenting of RCA and OM2 in 2006 b. s/p STEMI in 10/2017 with stenting of mid-RCA and PDA with DES x2  . Chronic pain   . Diabetes mellitus ([place])   . Hyperlipidemia   . Hypertension   . STEMI (ST elevation myocardial infarction) ([place])   . Tobacco use     Past Surgical History:  Procedure Laterality Date  . ABDOMINAL AORTOGRAM W/LOWER EXTREMITY Bilateral 06/07/2019   Procedure: ABDOMINAL AORTOGRAM W/LOWER EXTREMITY;  Surgeon: Elam Dutch, MD;  Location: [place] [facility];  Service: Cardiovascular;  Laterality: Bilateral;  . CORONARY STENT INTERVENTION N/A 11/13/2017   Procedure: CORONARY STENT INTERVENTION;  Surgeon: Martinique, Peter M, MD;  Location: [place] [facility];  Service: Cardiovascular;  Laterality: N/A;  . CORONARY/GRAFT ACUTE MI REVASCULARIZATION N/A 11/13/2017   Procedure: Coronary/Graft Acute MI Revascularization;  Surgeon: Martinique, Peter M, MD;  Location: [place] [facility];  Service:  Cardiovascular;  Laterality: N/A;  . FEMORAL-TIBIAL BYPASS GRAFT Right 07/01/2019   Procedure: Right  FEMORAL- POSTERIOR TIBIAL ARTERY BYPASS GRAFT with Composite PTFE and Reversed Saphenous Vein, RIGHT Common FEMORAL ENDARTERECTOMY with Profundoplasty;  Surgeon: Elam Dutch, MD;  Location: [place];  Service: Vascular;  Laterality: Right;  . LEFT HEART CATH AND CORONARY ANGIOGRAPHY N/A 11/13/2017   Procedure: LEFT HEART CATH AND CORONARY ANGIOGRAPHY;  Surgeon: Martinique, Peter M, MD;  Location: [place] [facility];  Service: Cardiovascular;  Laterality: N/A;  . TOTAL HIP ARTHROPLASTY Right 12/18/2017   Procedure: TOTAL HIP ARTHROPLASTY ANTERIOR APPROACH;  Surgeon: Rod Can, MD;  Location: [place];  Service: Orthopedics;  Laterality: Right;    Family History  Problem Relation Age of Onset  . Hypertension Father   . Heart disease Father     Social History   Socioeconomic History  . Marital status: Married    Spouse name: Not on file  . Number of children: Not on file  . Years of education: Not on file  . Highest education level: Not on file  Occupational History  . Not on file  Tobacco Use  . Smoking status: Former Smoker    Types: Cigarettes    Start date: 10/25/2017    Quit date: 11/15/2017    Years since quitting: 2.1  . Smokeless tobacco: Never Used  . Tobacco comment: 2-3 cigarettes per year.  Substance and Sexual Activity  . Alcohol use: No  .  Drug use: No  . Sexual activity: Not on file  Other Topics Concern  . Not on file  Social History Narrative  . Not on file   Social Determinants of Health   Financial Resource Strain:   . Difficulty of Paying Living Expenses:   Food Insecurity:   . Worried About Charity fundraiser in the Last Year:   . Arboriculturist in the Last Year:   Transportation Needs:   . Film/video editor (Medical):   Marland Kitchen Lack of Transportation (Non-Medical):   Physical Activity:   . Days of Exercise per Week:   . Minutes of Exercise per  Session:   Stress:   . Feeling of Stress :   Social Connections:   . Frequency of Communication with Friends and Family:   . Frequency of Social Gatherings with Friends and Family:   . Attends Religious Services:   . Active Member of Clubs or Organizations:   . Attends Archivist Meetings:   Marland Kitchen Marital Status:   Intimate Partner Violence:   . Fear of Current or Ex-Partner:   . Emotionally Abused:   Marland Kitchen Physically Abused:   . Sexually Abused:      Physical Exam   Vitals:   12/24/19 1144  BP: 98/84  Pulse: 82  Resp: 18  Temp: 99 F (37.2 C)  SpO2: 95%    CONSTITUTIONAL: Chronically ill-appearing, NAD NEURO:  Alert and oriented x 3, no focal deficits EYES:  eyes equal and reactive ENT/NECK:  no LAD, no JVD CARDIO: Regular rate, well-perfused, normal S1 and S2 PULM:  CTAB no wheezing or rhonchi GI/GU:  normal bowel sounds, non-distended, non-tender MSK/SPINE:  No gross deformities, no edema; midline thoracic spinal tenderness SKIN:  no rash, atraumatic PSYCH:  Appropriate speech and behavior  *Additional and/or pertinent findings included in MDM below  Diagnostic and Interventional Summary    EKG Interpretation  Date/Time:    Ventricular Rate:    PR Interval:    QRS Duration:   QT Interval:    QTC Calculation:   R Axis:     Text Interpretation:        Labs Reviewed - No data to display  DG Chest 2 View  Final Result    DG Thoracic Spine 2 View  Final Result    DG Lumbar Spine Complete  Final Result    CT HEAD WO CONTRAST  Final Result    CT CERVICAL SPINE WO CONTRAST  Final Result      Medications  acetaminophen (TYLENOL) tablet 1,000 mg (1,000 mg Oral Given 12/24/19 1203)     Procedures  /  Critical Care Procedures  ED Course and Medical Decision Making  I have reviewed the triage vital signs, the nursing notes, and pertinent available records from the EMR.  Listed above are laboratory and imaging tests that I personally ordered,  reviewed, and interpreted and then considered in my medical decision making (see below for details).      Patient initially denies blood thinners but does endorse taking Eliquis.  Poor insight into his comorbidities.  Will need CT head to exclude intracranial bleeding.  X-rays of the spine and chest as well.  Low concern for significant intrathoracic or intra-abdominal injury.  Imaging is largely reassuring with no signs of acute injury.  Patient is ambulating without much difficulty, no neurological deficits, appropriate for discharge.  Barth Kirks. Sedonia Small, [place] [email or web address]  Final Clinical  Impressions(s) / ED Diagnoses     ICD-10-CM   1. Acute midline thoracic back pain  M54.6   2. Fall  [email or web address].[email or web address] Abbe DG Chest 2 View    DG Chest 2 View    ED Discharge Orders         Ordered    methocarbamol (ROBAXIN) 500 MG tablet  Every 8 hours PRN     12/24/19 1337           Discharge Instructions Discussed with and Provided to Patient:     Discharge Instructions     You were evaluated in the Emergency Department and after careful evaluation, we did not find any emergent condition requiring admission or further testing in the hospital.  Your exam/testing today was overall reassuring.  Your CTs and x-rays did not show any significant injuries.  Your pain seems to be due to bruising or muscle strain.  Please take the Robaxin muscle relaxers as needed at home for discomfort.  We also recommend Tylenol 1000 mg every 4-6 hours for pain.  Please return to the Emergency Department if you experience any worsening of your condition.  We encourage you to follow up with a primary care provider.  Thank you for allowing [place] to be a part of your care.        Maudie Flakes, MD 12/24/19 1339

## 2019-12-24 NOTE — Discharge Instructions (Addendum)
You were evaluated in the Emergency Department and after careful evaluation, we did not find any emergent condition requiring admission or further testing in the hospital.  Your exam/testing today was overall reassuring.  Your CTs and x-rays did not show any significant injuries.  Your pain seems to be due to bruising or muscle strain.  Please take the Robaxin muscle relaxers as needed at home for discomfort.  We also recommend Tylenol 1000 mg every 4-6 hours for pain.  Please return to the Emergency Department if you experience any worsening of your condition.  We encourage you to follow up with a primary care provider.  Thank you for allowing [place] to be a part of your care.
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
## 2020-01-22 ENCOUNTER — Telehealth: Payer: Medicare HMO | Admitting: Cardiology

## 2020-01-22 NOTE — Progress Notes (Unsigned)
{Choose 1 Note Type (Video or Telephone):[id number]}   The patient was identified using 2 identifiers.  Date:  01/22/2020   ID:  Dennis [name], DOB 09-06-1946, MRN [id number]  {Patient Location:[phone]::"Home"} {Provider Location:[phone]::"Home"}  PCP:  Glenda Chroman, MD  Cardiologist:  No primary care provider on file. *** Electrophysiologist:  None   Evaluation Performed:  {Choose Visit [phone]::"Follow-Up Visit"}  Chief Complaint:  ***  History of Present Illness:    Dennis [name] is a 74 y.o. male seen for follow up of the following meidcal problems.  1. CAD - multiple stents in the past, last stent 10/2017 to RCA and PDA. - 10/2017 echo LVEF 55-60% - had been on triple therapy, found to be anemic during 12/2017 admission and ASA was stopped (has had some issues with hematuria around that time as well), on plavix and eliquis.  - medical therapy limited by soft bp's   - mild chest pain at times with position changes. No recent SOb/DOE - compliant with meds   2. Afib - notes indicate new onset afib postop hip surgery 11/2017  -he was not comfortable wearing monitor and did not complete -had been on eliquis but somehow off now, he is not sure history   - no recent palpitations.    3. Chronic diastolic HF - no recent SOB/DOE.  - has some intermittent swelling in his legs. Ok in morning, develops throughtou the day.  - has prn lasix.     4. Carotid stenosis - 01/2018 carotid US RICA 123456, LICA 123456 - no recent neuro symptoms  6. PAD - followed by vascular. Prior right CFA endarterectomy and right femoral to posterior tibial bypass     The patient {does/does not:200015} have symptoms concerning for COVID-19 infection (fever, chills, cough, or new shortness of breath).    Past Medical History:  Diagnosis Date  . Arthritis   . CAD (coronary artery disease)    a. s/p prior stenting of RCA and OM2 in 2006 b. s/p STEMI  in 10/2017 with stenting of mid-RCA and PDA with DES x2  . Chronic pain   . Diabetes mellitus ([place])   . Hyperlipidemia   . Hypertension   . STEMI (ST elevation myocardial infarction) ([place])   . Tobacco use    Past Surgical History:  Procedure Laterality Date  . ABDOMINAL AORTOGRAM W/LOWER EXTREMITY Bilateral 06/07/2019   Procedure: ABDOMINAL AORTOGRAM W/LOWER EXTREMITY;  Surgeon: Elam Dutch, MD;  Location: [facility];  Service: Cardiovascular;  Laterality: Bilateral;  . CORONARY STENT INTERVENTION N/A 11/13/2017   Procedure: CORONARY STENT INTERVENTION;  Surgeon: Martinique, Peter M, MD;  Location: [facility];  Service: Cardiovascular;  Laterality: N/A;  . CORONARY/GRAFT ACUTE MI REVASCULARIZATION N/A 11/13/2017   Procedure: Coronary/Graft Acute MI Revascularization;  Surgeon: Martinique, Peter M, MD;  Location: [facility];  Service: Cardiovascular;  Laterality: N/A;  . FEMORAL-TIBIAL BYPASS GRAFT Right 07/01/2019   Procedure: Right  FEMORAL- POSTERIOR TIBIAL ARTERY BYPASS GRAFT with Composite PTFE and Reversed Saphenous Vein, RIGHT Common FEMORAL ENDARTERECTOMY with Profundoplasty;  Surgeon: Elam Dutch, MD;  Location: [facility];  Service: Vascular;  Laterality: Right;  . LEFT HEART CATH AND CORONARY ANGIOGRAPHY N/A 11/13/2017   Procedure: LEFT HEART CATH AND CORONARY ANGIOGRAPHY;  Surgeon: Martinique, Peter M, MD;  Location: [facility];  Service: Cardiovascular;  Laterality: N/A;  . TOTAL HIP ARTHROPLASTY Right 12/18/2017   Procedure: TOTAL HIP ARTHROPLASTY ANTERIOR APPROACH;  Surgeon: Rod Can, MD;  Location: [facility];  Service: Orthopedics;  Laterality: Right;     No outpatient medications have been marked as taking for the 01/22/20 encounter (Appointment) with Arnoldo Lenis, MD.     Allergies:   Betadine [povidone iodine], Fish allergy, Iodine, Shellfish allergy, Sulfamethoxazole-trimethoprim, Gabapentin, Chlorhexidine, Trimethoprim, and Tramadol    Social History   Tobacco Use  . Smoking status: Former Smoker    Types: Cigarettes    Start date: 10/25/2017    Quit date: 11/15/2017    Years since quitting: 2.1  . Smokeless tobacco: Never Used  . Tobacco comment: 2-3 cigarettes per year.  Substance Use Topics  . Alcohol use: No  . Drug use: No     Family Hx: The patient's family history includes Heart disease in his father; Hypertension in his father.  ROS:   Please see the history of present illness.    *** All other systems reviewed and are negative.   Prior CV studies:   The following studies were reviewed today:  10/2017 cath  Mid RCA lesion is 100% stenosed.  Dist RCA lesion is 40% stenosed.  Prox RCA lesion is 50% stenosed.  Ost RPDA to RPDA lesion is 99% stenosed.  Prox LAD to Mid LAD lesion is 60% stenosed.  Ost 1st Diag lesion is 90% stenosed.  Ost 2nd Diag to 2nd Diag lesion is 85% stenosed.  Ost 2nd Mrg to 2nd Mrg lesion is 40% stenosed.  Ost Cx to Prox Cx lesion is 40% stenosed.  Ost 1st Mrg to 1st Mrg lesion is 70% stenosed.  The left ventricular systolic function is normal.  LV end diastolic pressure is mildly elevated.  The left ventricular ejection fraction is 55-65% by visual estimate.  Post intervention, there is a 0% residual stenosis.  A drug-eluting stent was successfully placed using a STENT SYNERGY DES 2.25X28.  Post intervention, there is a 0% residual stenosis.  A drug-eluting stent was successfully placed using a STENT SYNERGY DES 3X20.  1. 3 vessel obstructive CAD. - diffuse 60% proximal to mid LAD with severe disease in small diagonal branches. - 70% OM1. Stent in OM 2 is diffusely diseased but patent - 100% mid RCA and 99% PDA 2. Good LV function 3. Mildly elevated LVEDP 4. Successful stenting of the PDA and mid RCA with DES x 2. Diffusely diseased and heavily calcified RCA.  Plan: DAPT indefinitely. Aggressive risk factor modification and smoking  cessation.   10/2017 echo Study Conclusions  - Left ventricle: The cavity size was normal. Systolic function was normal. The estimated ejection fraction was in the range of 55% to 60%. Possible mild hypokinesis of the basal-midinferior myocardium. - Left atrium: The atrium was mildly dilated.  Labs/Other Tests and Data Reviewed:    EKG:  {EKG/Telemetry Strips Reviewed:[id number]}  Recent Labs: 07/22/2019: ALT 12; BUN 12; Creatinine, Ser 1.40; Hemoglobin 10.1; Platelets 411; Potassium 4.1; Sodium 138   Recent Lipid Panel Lab Results  Component Value Date/Time   CHOL 117 11/14/2017 05:00 AM   TRIG 72 11/14/2017 05:00 AM   HDL 33 (L) 11/14/2017 05:00 AM   CHOLHDL 3.5 11/14/2017 05:00 AM   LDLCALC 70 11/14/2017 05:00 AM    Wt Readings from Last 3 Encounters:  12/24/19 205 lb (93 kg)  11/14/19 205 lb (93 kg)  09/12/19 205 lb 12.8 oz (93.4 kg)     Objective:    Vital Signs:  There were no vitals taken for this visit.   {[facility] Virtual Exam (Optional):[phone]::"VITAL SIGNS:  reviewed"}  ASSESSMENT & PLAN:    1. CAD -no recent symptoms. Due to his extensive CAD history and multiple interventions we will continue DAPT indefinitely.   2. Afib - new onset after hip surgery 11/2017 - unclear if isolated event after surgery or if true PAF. EKG today shows NSR -he was not comfortable wearing monitor and did not complete -had been on eliquis, unclear how he is off   - monitor for recurrence, if detected would need to commit to anticoag  3. Chronic diastolic HF - continue prn lasix, asked to take more frequentlty due to some recent swelling.   4. Carotidstenosis -continue medical therapy at this time    COVID-19 Education: The signs and symptoms of COVID-19 were discussed with the patient and how to seek care for testing (follow up with PCP or arrange E-visit).  ***The importance of social distancing was discussed today.  Time:   Today, I have  spent *** minutes with the patient with telehealth technology discussing the above problems.     Medication Adjustments/Labs and Tests Ordered: Current medicines are reviewed at length with the patient today.  Concerns regarding medicines are outlined above.   Tests Ordered: No orders of the defined types were placed in this encounter.   Medication Changes: No orders of the defined types were placed in this encounter.   Follow Up:  {F/U [phone]} {follow up:15908}  Signed, Carlyle Dolly, MD  01/22/2020 12:54 PM    [facility]

## 2020-01-25 ENCOUNTER — Emergency Department (HOSPITAL_COMMUNITY)
Admission: EM | Admit: 2020-01-25 | Discharge: 2020-01-25 | Disposition: A | Discharge status: Home / Self Care | Payer: Medicare HMO | Attending: Emergency Medicine | Admitting: Emergency Medicine

## 2020-01-25 ENCOUNTER — Other Ambulatory Visit: Payer: Self-pay

## 2020-01-25 ENCOUNTER — Emergency Department (HOSPITAL_COMMUNITY): Payer: Medicare HMO

## 2020-01-25 ENCOUNTER — Encounter (HOSPITAL_COMMUNITY): Payer: Self-pay | Admitting: Emergency Medicine

## 2020-01-25 DIAGNOSIS — Z79899 Other long term (current) drug therapy: Secondary | ICD-10-CM | POA: Insufficient documentation

## 2020-01-25 DIAGNOSIS — M546 Pain in thoracic spine: Secondary | ICD-10-CM | POA: Diagnosis not present

## 2020-01-25 DIAGNOSIS — I2510 Atherosclerotic heart disease of native coronary artery without angina pectoris: Secondary | ICD-10-CM | POA: Insufficient documentation

## 2020-01-25 DIAGNOSIS — Z87891 Personal history of nicotine dependence: Secondary | ICD-10-CM | POA: Insufficient documentation

## 2020-01-25 DIAGNOSIS — Z7984 Long term (current) use of oral hypoglycemic drugs: Secondary | ICD-10-CM | POA: Insufficient documentation

## 2020-01-25 DIAGNOSIS — E119 Type 2 diabetes mellitus without complications: Secondary | ICD-10-CM | POA: Insufficient documentation

## 2020-01-25 DIAGNOSIS — I10 Essential (primary) hypertension: Secondary | ICD-10-CM | POA: Diagnosis not present

## 2020-01-25 DIAGNOSIS — M545 Low back pain: Secondary | ICD-10-CM | POA: Insufficient documentation

## 2020-01-25 DIAGNOSIS — Z7901 Long term (current) use of anticoagulants: Secondary | ICD-10-CM | POA: Insufficient documentation

## 2020-01-25 DIAGNOSIS — R1032 Left lower quadrant pain: Secondary | ICD-10-CM | POA: Diagnosis not present

## 2020-01-25 DIAGNOSIS — W108XXA Fall (on) (from) other stairs and steps, initial encounter: Secondary | ICD-10-CM | POA: Insufficient documentation

## 2020-01-25 DIAGNOSIS — R109 Unspecified abdominal pain: Secondary | ICD-10-CM

## 2020-01-25 DIAGNOSIS — Z7902 Long term (current) use of antithrombotics/antiplatelets: Secondary | ICD-10-CM | POA: Diagnosis not present

## 2020-01-25 DIAGNOSIS — I252 Old myocardial infarction: Secondary | ICD-10-CM | POA: Insufficient documentation

## 2020-01-25 DIAGNOSIS — Z96641 Presence of right artificial hip joint: Secondary | ICD-10-CM | POA: Diagnosis not present

## 2020-01-25 DIAGNOSIS — W19XXXA Unspecified fall, initial encounter: Secondary | ICD-10-CM

## 2020-01-25 MED ORDER — OXYCODONE-ACETAMINOPHEN 5-325 MG PO TABS
1.0000 | ORAL_TABLET | Freq: Once | ORAL | Status: AC
  Administered 2020-01-25: 18:00:00 1 via ORAL
  Filled 2020-01-25: qty 1

## 2020-01-25 MED ORDER — OXYCODONE-ACETAMINOPHEN 5-325 MG PO TABS: 1.0000 | ORAL_TABLET | Freq: Four times a day (QID) | ORAL | 0 refills | Status: DC | PRN

## 2020-01-25 NOTE — ED Provider Notes (Signed)
[facility] EMERGENCY DEPARTMENT Provider Note   CSN: [id number] Arrival date & time: 01/25/20  1653     History Chief Complaint  Patient presents with  . Fall    Dennis [name] is a 74 y.o. male.  He said he slipped and fell off the porch landing on his left flank 4 days ago.  He said it was about 3 steps up.  Denies any loss consciousness.  Since then he has had severe pain in his left posterior chest down to his left flank.  Worse with coughing deep breathing twisting and turning.  No hemoptysis.  No headache blurry vision neck pain numbness weakness abdominal pain.  No hematuria.  Is on blood thinners.  The history is provided by the patient.  Fall This is a new problem. The current episode started more than 2 days ago. The problem occurs constantly. The problem has not changed since onset.Associated symptoms include chest pain. Pertinent negatives include no abdominal pain, no headaches and no shortness of breath. The symptoms are aggravated by bending, twisting and coughing. Nothing relieves the symptoms. He has tried nothing for the symptoms. The treatment provided no relief.       Past Medical History:  Diagnosis Date  . Arthritis   . CAD (coronary artery disease)    a. s/p prior stenting of RCA and OM2 in 2006 b. s/p STEMI in 10/2017 with stenting of mid-RCA and PDA with DES x2  . Chronic pain   . Diabetes mellitus ([place])   . Hyperlipidemia   . Hypertension   . STEMI (ST elevation myocardial infarction) ([place])   . Tobacco use     Patient Active Problem List   Diagnosis Date Noted  . PAD (peripheral artery disease) ([place]) 07/01/2019  . Palpitations 01/23/2018  . Chronic anticoagulation 01/23/2018  . Chronic back pain 01/23/2018  . Atrial fibrillation with RVR ([place])   . PAF (paroxysmal atrial fibrillation) ([place]) 12/15/2017  . Delirium   . Closed right hip fracture ([place]) 12/09/2017  . CAD S/P percutaneous coronary angioplasty 12/04/2017  . Hypertension 12/04/2017  .  Hyperlipidemia 11/15/2017  . Tobacco abuse 11/13/2017  . History of ST elevation myocardial infarction (STEMI) 11/13/2017  . Insulin dependent diabetes mellitus with complications [id number]    Past Surgical History:  Procedure Laterality Date  . ABDOMINAL AORTOGRAM W/LOWER EXTREMITY Bilateral 06/07/2019   Procedure: ABDOMINAL AORTOGRAM W/LOWER EXTREMITY;  Surgeon: Elam Dutch, MD;  Location: [place] [facility];  Service: Cardiovascular;  Laterality: Bilateral;  . CORONARY STENT INTERVENTION N/A 11/13/2017   Procedure: CORONARY STENT INTERVENTION;  Surgeon: Martinique, Peter M, MD;  Location: [place] [facility];  Service: Cardiovascular;  Laterality: N/A;  . CORONARY/GRAFT ACUTE MI REVASCULARIZATION N/A 11/13/2017   Procedure: Coronary/Graft Acute MI Revascularization;  Surgeon: Martinique, Peter M, MD;  Location: [place] [facility];  Service: Cardiovascular;  Laterality: N/A;  . FEMORAL-TIBIAL BYPASS GRAFT Right 07/01/2019   Procedure: Right  FEMORAL- POSTERIOR TIBIAL ARTERY BYPASS GRAFT with Composite PTFE and Reversed Saphenous Vein, RIGHT Common FEMORAL ENDARTERECTOMY with Profundoplasty;  Surgeon: Elam Dutch, MD;  Location: [place];  Service: Vascular;  Laterality: Right;  . LEFT HEART CATH AND CORONARY ANGIOGRAPHY N/A 11/13/2017   Procedure: LEFT HEART CATH AND CORONARY ANGIOGRAPHY;  Surgeon: Martinique, Peter M, MD;  Location: [place] [facility];  Service: Cardiovascular;  Laterality: N/A;  . TOTAL HIP ARTHROPLASTY Right 12/18/2017   Procedure: TOTAL HIP ARTHROPLASTY ANTERIOR APPROACH;  Surgeon: Rod Can, MD;  Location: [facility]  OR;  Service: Orthopedics;  Laterality: Right;       Family History  Problem Relation Age of Onset  . Hypertension Father   . Heart disease Father     Social History   Tobacco Use  . Smoking status: Former Smoker    Types: Cigarettes    Start date: 10/25/2017    Quit date: 11/15/2017    Years since quitting: 2.1  . Smokeless tobacco: Never Used  . Tobacco  comment: 2-3 cigarettes per year.  Substance Use Topics  . Alcohol use: No  . Drug use: No    Home Medications Prior to Admission medications   Medication Sig Start Date End Date Taking? Authorizing Provider  acetaminophen (TYLENOL) 500 MG tablet Take 1,000 mg by mouth every 6 (six) hours as needed for mild pain or headache.    [provider]  apixaban (ELIQUIS) 5 MG TABS tablet Take 1 tablet (5 mg total) by mouth 2 (two) times daily. 07/11/19   Elam Dutch, MD  clopidogrel (PLAVIX) 75 MG tablet Take 1 tablet by mouth every day WITH BREAKFAST 11/08/19   Arnoldo Lenis, MD  diazepam (VALIUM) 10 MG tablet Take 1 tablet by mouth at bedtime as needed for sleep.  06/14/18   [provider]  furosemide (LASIX) 20 MG tablet Take 20 mg by mouth daily as needed for fluid (swelling).     [provider]  HYDROcodone-acetaminophen (NORCO) 5-325 MG tablet Take 1 tablet by mouth every 4 (four) hours as needed for moderate pain. 09/12/19   Dagoberto Ligas, PA-C  ibuprofen (ADVIL) 400 MG tablet Take 400 mg by mouth every 4 (four) hours as needed.    [provider]  linaclotide (LINZESS) 290 MCG CAPS capsule Take 290 mcg by mouth daily as needed (constipation).     [provider]  lisinopril (ZESTRIL) 5 MG tablet Take 5 mg by mouth daily. 12/09/19   [provider]  Menthol, Topical Analgesic, (ICY HOT BACK EX) Apply 1 application topically every 6 (six) hours as needed (pain).    [provider]  metFORMIN (GLUCOPHAGE) 850 MG tablet Take 850 mg by mouth daily with breakfast. 12/08/17   [provider]  methocarbamol (ROBAXIN) 500 MG tablet Take 1 tablet (500 mg total) by mouth every 8 (eight) hours as needed for muscle spasms. 12/24/19   Maudie Flakes, MD  metoprolol tartrate (LOPRESSOR) 25 MG tablet Take 1 tablet by mouth twice a day Patient taking differently: Take 25 mg by mouth 2 (two) times daily.  03/25/19   Arnoldo Lenis, MD  nitroGLYCERIN (NITROSTAT) 0.4 MG SL tablet Place 1 tablet (0.4 mg total) under the tongue every 5 (five) minutes x 3 doses as needed for chest pain. 11/15/17   Cheryln Manly, NP  rosuvastatin (CRESTOR) 20 MG tablet Take 1 tablet (20 mg total) by mouth daily. 05/22/19   Elam Dutch, MD  silver sulfADIAZINE (SILVADENE) 1 % cream Apply 1 application topically daily.    [provider]  TRESIBA FLEXTOUCH 200 UNIT/ML SOPN Inject 16 Units into the skin daily. 07/11/19   Rhyne, Hulen Shouts, PA-C  vitamin B-12 1000 MCG tablet Take 1 tablet (1,000 mcg total) by mouth daily. 12/26/17   Hongalgi, Lenis Dickinson, MD    Allergies    Betadine [povidone iodine], Fish allergy, Iodine, Shellfish allergy, Sulfamethoxazole-trimethoprim, Gabapentin, Chlorhexidine, Trimethoprim, and Tramadol  Review of Systems   Review of Systems  Constitutional: Negative for fever.  HENT: Negative  for sore throat.   Eyes: Negative for visual disturbance.  Respiratory: Negative for shortness of breath.   Cardiovascular: Positive for chest pain.  Gastrointestinal: Negative for abdominal pain.  Genitourinary: Positive for flank pain. Negative for dysuria.  Musculoskeletal: Positive for back pain. Negative for neck pain.  Skin: Negative for rash.  Neurological: Negative for headaches.    Physical Exam Updated Vital Signs BP (!) 151/81 (BP Location: Left Arm)   Pulse 71   Temp 97.6 F (36.4 C) (Oral)   Resp 18   Ht 6' (1.829 m)   Wt 99.8 kg   SpO2 100%   BMI 29.84 kg/m   Physical Exam Vitals and nursing note reviewed.  Constitutional:      Appearance: He is well-developed.  HENT:     Head: Normocephalic and atraumatic.  Eyes:     Conjunctiva/sclera: Conjunctivae normal.  Cardiovascular:     Rate and Rhythm: Normal rate and regular rhythm.     Heart sounds: No murmur.  Pulmonary:     Effort: Pulmonary effort is normal. No respiratory distress.     Breath sounds: Normal breath sounds.   Abdominal:     Palpations: Abdomen is soft.     Tenderness: There is no abdominal tenderness.  Musculoskeletal:        General: Tenderness present. Normal range of motion.       Arms:     Cervical back: Neck supple.  Skin:    General: Skin is warm and dry.     Capillary Refill: Capillary refill takes less than 2 seconds.  Neurological:     General: No focal deficit present.     Mental Status: He is alert.     Sensory: No sensory deficit.     Motor: No weakness.     ED Results / Procedures / Treatments   Labs (all labs ordered are listed, but only abnormal results are displayed) Labs Reviewed - No data to display  EKG None  Radiology CT Abdomen Pelvis Wo Contrast  Result Date: 01/25/2020 CLINICAL DATA:  Abdominal trauma with left flank pain after fall. On blood thinners EXAM: CT CHEST, ABDOMEN AND PELVIS WITHOUT CONTRAST TECHNIQUE: Multidetector CT imaging of the chest, abdomen and pelvis was performed following the standard protocol without IV contrast. COMPARISON:  01/23/2015 CT report FINDINGS: CT CHEST FINDINGS Cardiovascular: Normal heart size. No pericardial effusion. Extensive coronary atherosclerosis. Mediastinum/Nodes: No hematoma or pneumomediastinum. Lungs/Pleura: No hemothorax, pneumothorax, or lung contusion. Streaky opacity in the dependent right lung with swirling interstitium and volume loss consistent with scarring/round atelectasis. Generalized airway thickening. Musculoskeletal: Spondylosis with diffuse bridging osteophyte. No acute fracture or subluxation. CT ABDOMEN PELVIS FINDINGS Hepatobiliary: No hepatic injury or perihepatic hematoma. Gallbladder is unremarkable. Granulomatous type calcifications in the liver. Pancreas: Negative Spleen: Negative Adrenals/Urinary Tract: No adrenal hemorrhage or renal injury identified. Bladder is unremarkable. Stomach/Bowel: No evidence of injury. Vascular/Lymphatic: No acute vascular finding. Diffuse atherosclerotic calcified  plaque. Scarring around the right common femoral artery and vein. Reproductive: Enlarged prostate projecting into the bladder base. Other: No ascites or pneumoperitoneum Musculoskeletal: Total right hip arthroplasty. Spondylosis with bulky endplate spurs. 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 Grade 1 anterolisthesis at L5-S1. IMPRESSION: 1. No acute/traumatic finding seen in the chest or abdomen. 2. Chronic findings are described above. Electronically Signed   By: Monte Fantasia M.D.   On: 01/25/2020 18:56   CT Head Wo Contrast  Result Date: 01/25/2020 CLINICAL DATA:  Status post fall. EXAM: CT HEAD  WITHOUT CONTRAST TECHNIQUE: Contiguous axial images were obtained from the base of the skull through the vertex without intravenous contrast. COMPARISON:  December 24, 2019 FINDINGS: Brain: There is mild cerebral atrophy with widening of the extra-axial spaces and ventricular dilatation. There are areas of decreased attenuation within the white matter tracts of the supratentorial brain, consistent with microvascular disease changes. Vascular: No hyperdense vessel or unexpected calcification. Skull: Normal. Negative for fracture or focal lesion. Sinuses/Orbits: No acute finding. Other: None. IMPRESSION: 1. Generalized cerebral atrophy. 2. No acute intracranial abnormality Electronically Signed   By: Virgina Norfolk M.D.   On: 01/25/2020 18:52   CT Chest Wo Contrast  Result Date: 01/25/2020 CLINICAL DATA:  Abdominal trauma with left flank pain after fall. On blood thinners EXAM: CT CHEST, ABDOMEN AND PELVIS WITHOUT CONTRAST TECHNIQUE: Multidetector CT imaging of the chest, abdomen and pelvis was performed following the standard protocol without IV contrast. COMPARISON:  01/23/2015 CT report FINDINGS: CT CHEST FINDINGS Cardiovascular: Normal heart size. No pericardial effusion. Extensive coronary atherosclerosis. Mediastinum/Nodes: No hematoma or pneumomediastinum. Lungs/Pleura: No hemothorax,  pneumothorax, or lung contusion. Streaky opacity in the dependent right lung with swirling interstitium and volume loss consistent with scarring/round atelectasis. Generalized airway thickening. Musculoskeletal: Spondylosis with diffuse bridging osteophyte. No acute fracture or subluxation. CT ABDOMEN PELVIS FINDINGS Hepatobiliary: No hepatic injury or perihepatic hematoma. Gallbladder is unremarkable. Granulomatous type calcifications in the liver. Pancreas: Negative Spleen: Negative Adrenals/Urinary Tract: No adrenal hemorrhage or renal injury identified. Bladder is unremarkable. Stomach/Bowel: No evidence of injury. Vascular/Lymphatic: No acute vascular finding. Diffuse atherosclerotic calcified plaque. Scarring around the right common femoral artery and vein. Reproductive: Enlarged prostate projecting into the bladder base. Other: No ascites or pneumoperitoneum Musculoskeletal: Total right hip arthroplasty. Spondylosis with bulky endplate spurs. 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 Grade 1 anterolisthesis at L5-S1. IMPRESSION: 1. No acute/traumatic finding seen in the chest or abdomen. 2. Chronic findings are described above. Electronically Signed   By: Monte Fantasia M.D.   On: 01/25/2020 18:56   CT Cervical Spine Wo Contrast  Result Date: 01/25/2020 CLINICAL DATA:  Status post fall. EXAM: CT CERVICAL SPINE WITHOUT CONTRAST TECHNIQUE: Multidetector CT imaging of the cervical spine was performed without intravenous contrast. Multiplanar CT image reconstructions were also generated. COMPARISON:  December 24, 2018 FINDINGS: Alignment: Normal. Skull base and vertebrae: No acute fracture. No primary bone lesion or focal pathologic process. Soft tissues and spinal canal: No prevertebral fluid or swelling. No visible canal hematoma. Disc levels: Marked severity anterior osteophyte formation is seen at the levels of C2-C3, C4-C5, C5-C6 and C6-C7. Moderate severity osteophyte formation is seen at the level  of C7-T1. Mild multilevel intervertebral disc space narrowing is seen. There is mild bilateral multilevel facet joint hypertrophy, left greater than right. Upper chest: Negative. Other: None. IMPRESSION: 1. No acute fracture within the cervical spine. 2. Marked severity multilevel degenerative changes, most prominent at the levels of C2-C3, C4-C5, C5-C6 and C6-C7. Electronically Signed   By: Virgina Norfolk M.D.   On: 01/25/2020 19:18    Procedures Procedures (including critical care time)  Medications Ordered in ED Medications  oxyCODONE-acetaminophen (PERCOCET/ROXICET) 5-325 MG per tablet 1 tablet (has no administration in time range)    ED Course  I have reviewed the triage vital signs and the nursing notes.  Pertinent labs & imaging results that were available during my care of the patient were reviewed by me and considered in my medical decision making (see chart for details).  Clinical Course as of Jan 26 852  Sat Jan 25, 2020  1936 Reviewed results of imaging with the patient.   [MB]    Clinical Course User Index [MB] Hayden Rasmussen, MD   MDM Rules/Calculators/A&P                     This patient complains of flank and back pain after a fall; this involves an extensive number of treatment Options and is a complaint that carries with it a high risk of complications and Morbidity. The differential includes rib fractures, pulmonary contusion, spinal fracture, retroperitoneal hematoma, pneumothorax   I ordered medication percocet with some improvement in his pain I ordered imaging studies which included CT head cervical spine chest abdomen pelvis and I independently    visualized and interpreted imaging which showed no gross fractures no intracranial bleeding no retroperitoneal bleeding Additional history obtained from patient's wife Previous records obtained and reviewed in epic  After the interventions stated above, I reevaluated the patient and found the patient to  have no significant traumatic findings.  Will provide prescription with pain medication and recommended using care with this as may make sedate or unsteady.  Also recommended a laxative.  Return instructions discussed   Final Clinical Impression(s) / ED Diagnoses Final diagnoses:  Fall, initial encounter  Left flank pain    Rx / DC Orders ED Discharge Orders         Ordered    oxyCODONE-acetaminophen (PERCOCET/ROXICET) 5-325 MG tablet  Every 6 hours PRN     01/25/20 1940           Hayden Rasmussen, MD 01/26/20 [phone]

## 2020-01-25 NOTE — ED Triage Notes (Signed)
Patient c/o left flank pain and mid to lower back pain that radiates into sacrum. Per patient fell on Wednesday or Thursday. Patient states he was turning around on steps of porch when he fell. Patient fell from 4 steps up on porch and landed on ground and last step. Patient hit head but denies LOC, dizziness, headache, nausea, or vomiting. Patient states pain is now constant. Patient denies any incontinency of urine or stool.

## 2020-01-25 NOTE — Discharge Instructions (Addendum)
You were seen in the emergency department for evaluation of injuries from a fall.  You had a CAT scan of your head cervical spine chest abdomen and pelvis that did not show any acute findings.  We are prescribing you some pain medication.  You should also start a laxative and drink plenty of fluids.  Please contact your doctor on Monday for close follow-up.  Return to the emergency department if any worsening or concerning symptoms

## 2020-07-27 ENCOUNTER — Other Ambulatory Visit: Payer: Self-pay | Admitting: *Deleted

## 2020-07-27 MED ORDER — METOPROLOL TARTRATE 25 MG PO TABS: 25.0000 mg | ORAL_TABLET | Freq: Two times a day (BID) | ORAL | 0 refills | Status: DC

## 2020-08-02 IMAGING — CT CT HEAD W/O CM
3 of 4 series · 16 of 47 positions shown, 19 images · non-contrast
Comparison: None.

CLINICAL DATA: 73-year-old male with acute altered mental status
and confusion.

EXAM:
CT HEAD WITHOUT CONTRAST
TECHNIQUE: Contiguous axial images were obtained from the base of the skull
through the vertex without intravenous contrast.

[Series 4: head 2.0 h70h · axial · 0.47mm/px · z∈[-120,+56]mm · 10 of 98 slices shown, 13 images]
[im 5/98  brain]
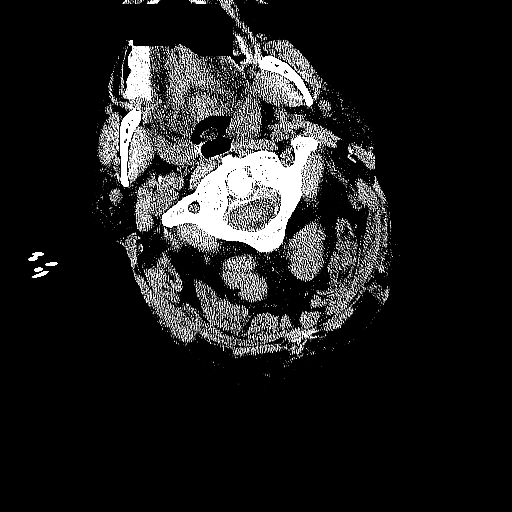
[im 5/98  bone]
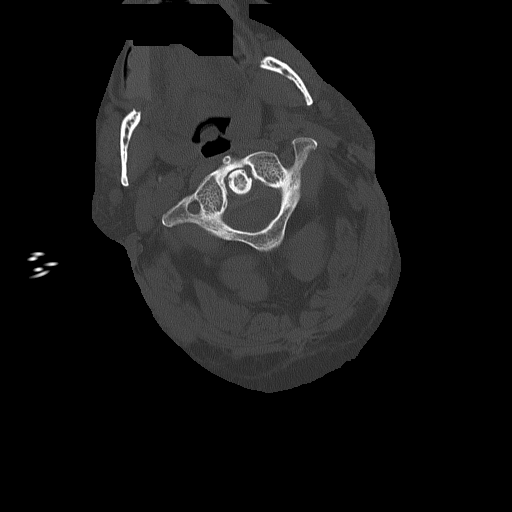
[im 15/98  brain]
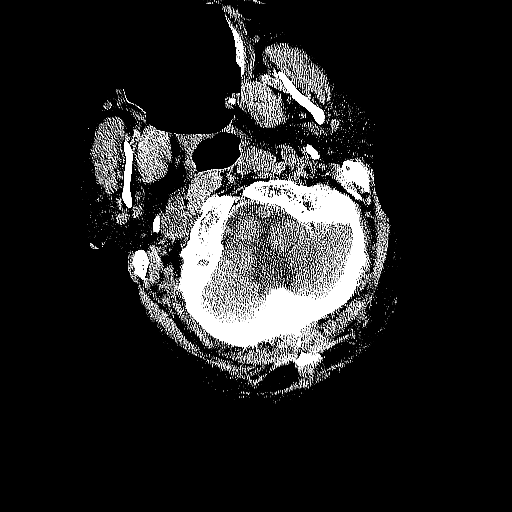
[im 25/98  brain]
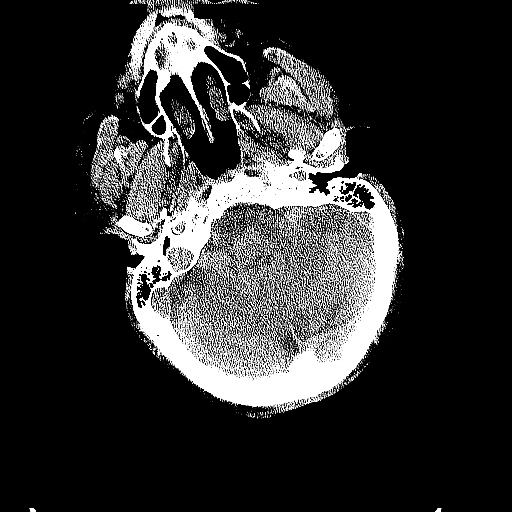
[im 34/98  brain]
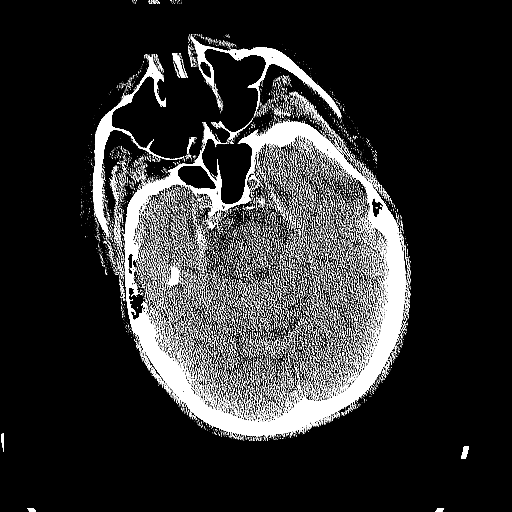
[im 44/98  brain]
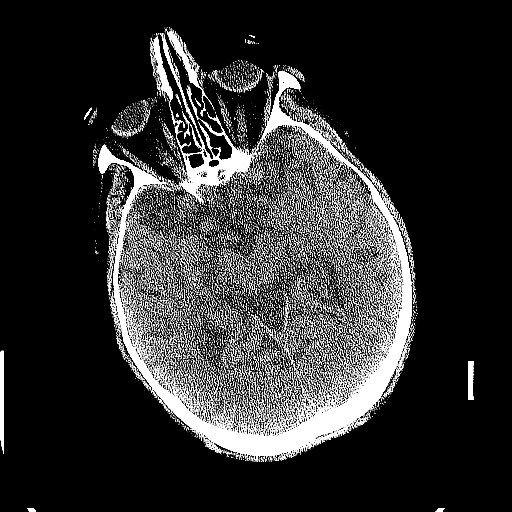
[im 44/98  bone]
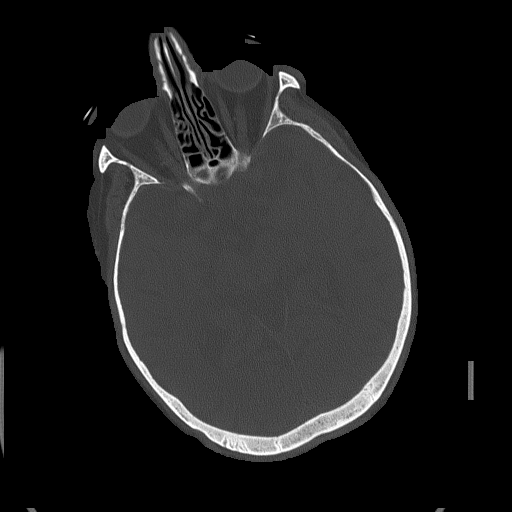
[im 54/98  brain]
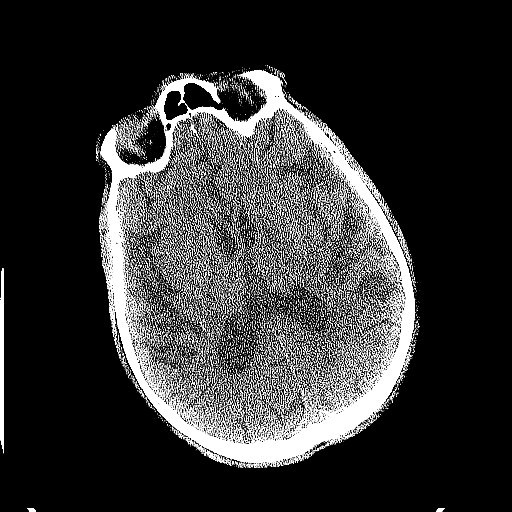
[im 64/98  brain]
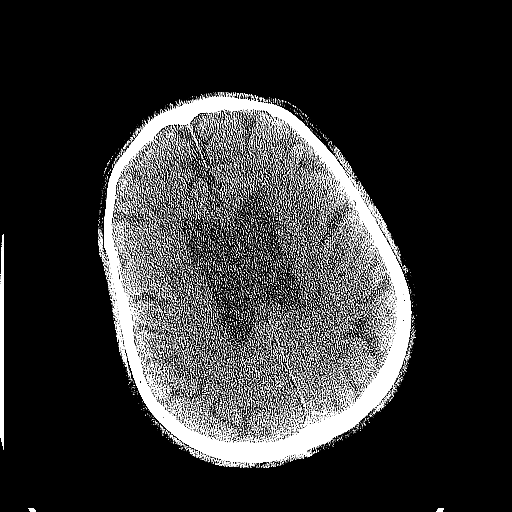
[im 73/98  brain]
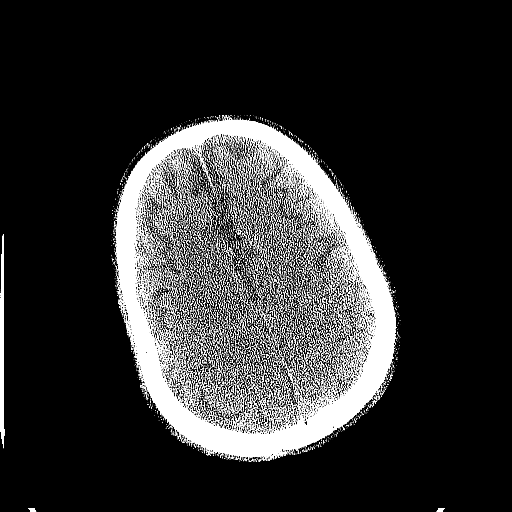
[im 83/98  brain]
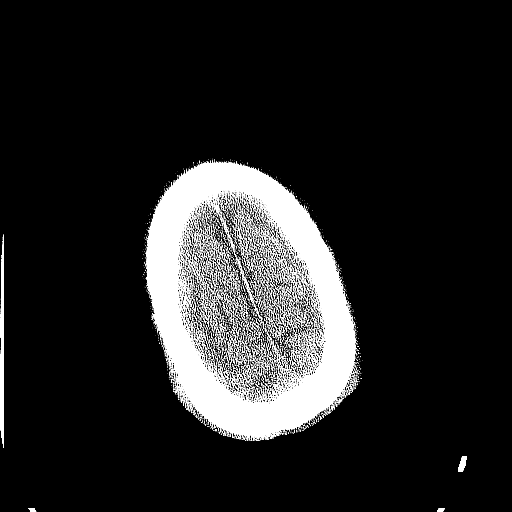
[im 83/98  bone]
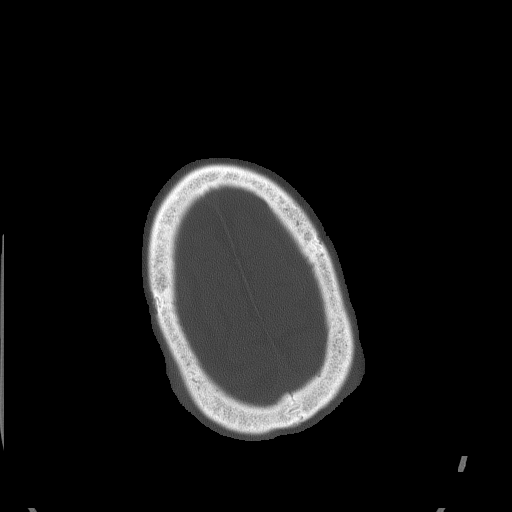
[im 93/98  brain]
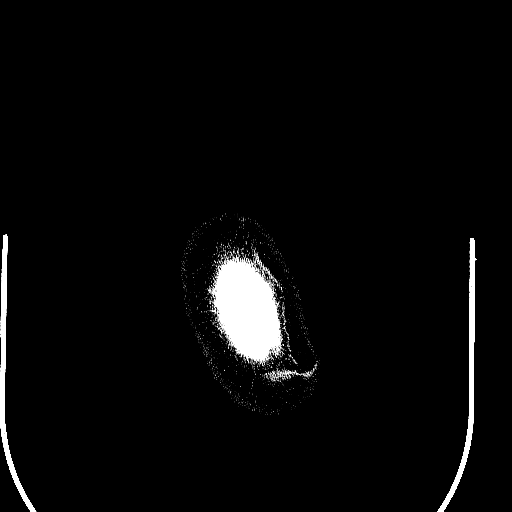

[Series 5: head 3.0 mpr cor · coronal · 0.38mm/px · 3 of 67 slices shown]
[im 23/67  brain]
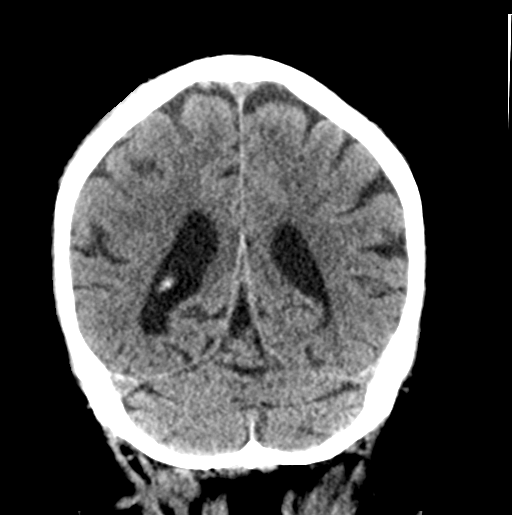
[im 30/67  brain]
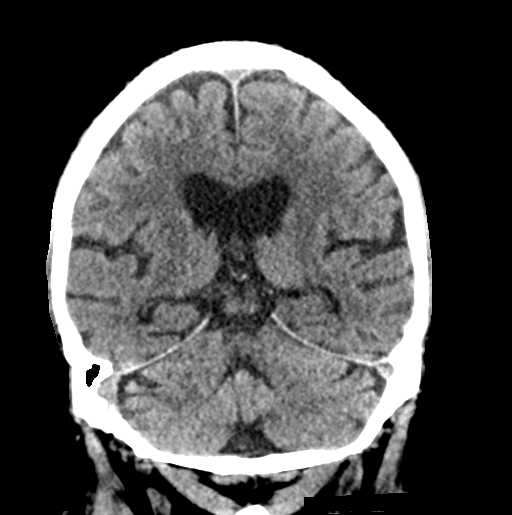
[im 37/67  brain]
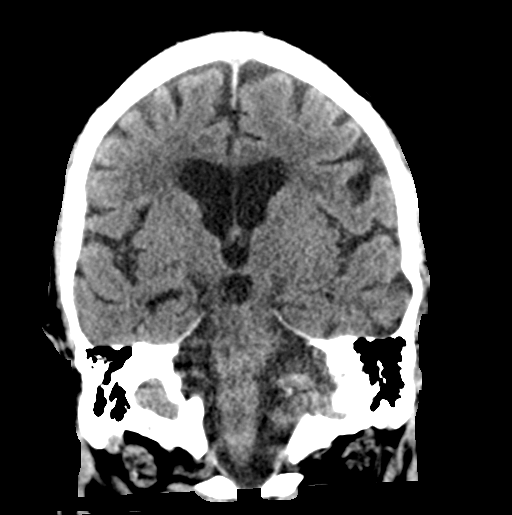

[Series 6: head 3.0 mpr sag · sagittal · 0.38mm/px · 3 of 67 slices shown]
[im 23/67  brain]
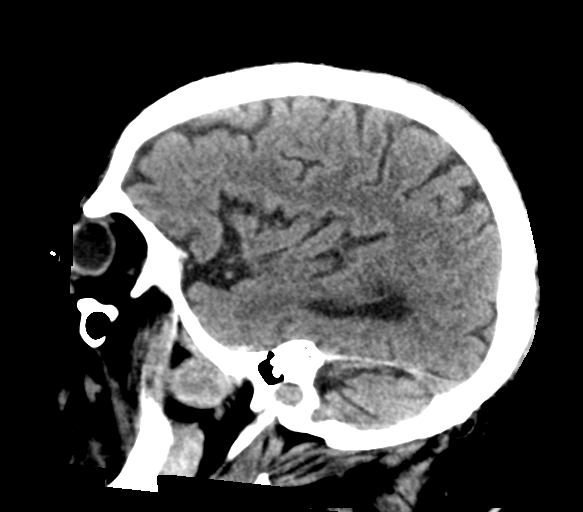
[im 34/67  brain]
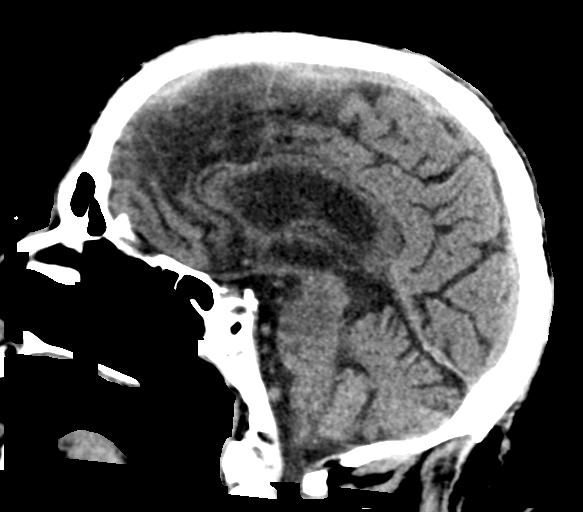
[im 45/67  brain]
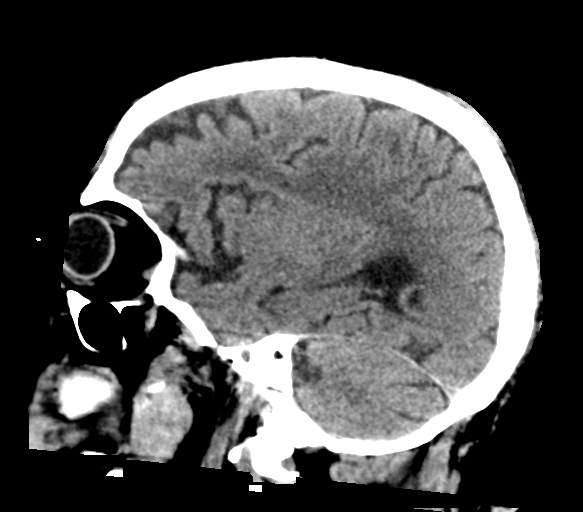

[16 of 47 positions shown; findings below may reference images not displayed]

FINDINGS: Brain: No evidence of acute infarction, hemorrhage, hydrocephalus,
extra-axial collection or mass lesion/mass effect.

Atrophy, chronic small-vessel white matter ischemic changes and
remote RIGHT caudate lacunar infarct noted.

Vascular: Carotid atherosclerotic calcifications noted.

Skull: Normal. Negative for fracture or focal lesion.

Sinuses/Orbits: No acute finding.

Other: None
IMPRESSION: 1. No evidence of acute intracranial abnormality.
2. Atrophy, chronic small-vessel white matter ischemic changes and
remote RIGHT caudate lacunar infarct.

## 2021-02-21 NOTE — Progress Notes (Deleted)
Cardiology Office Note  Date: 02/21/2021   ID: ODUS CLASBY, DOB 07/05/1946, MRN [id number]  PCP:  Glenda Chroman, MD  Cardiologist:  Carlyle Dolly, MD Electrophysiologist:  None   Chief Complaint: Follow up  History of Present Illness: Dennis [name] is a 75 y.o. male with a history of CAD / STEMI, DM2, HTN, HLD, Tobacco use.   Last seen by Bernerd Pho, PA-C 06/12/2019.  He had previously been seen by Dr. Harl Bowie in May 2020.  He reported episodes of leg discomfort concerning for claudication and ABIs were recommended.  He was referred to vascular surgery due to nonhealing wounds on his right foot.  Underwent lower extremity angiography September 2020 demonstrating occlusion of right superficial femoral/popliteal arteries with two-vessel peroneal posterior tibial arterial runoff to the right foot.  Cardiac clearance was recommended for possible right femoral to posterior tibial arterial bypass for limb salvage.  When seeing Ms. Strader on 06/12/2019 he was not overly active and only walk from room to room.  Experiencing some baseline dyspnea on exertion but denied any chest pain.  Experienced occasional palpitations occurring at night.  He denies any palpitations or associated dizziness or syncope / presyncope.  Blood pressure was well controlled.   He subsequently underwent right femoral-posterior tibial artery bypass graft and reversed SVG right common femoral endarterectomy on 10.20/2020.  Past Medical History:  Diagnosis Date  . Arthritis   . CAD (coronary artery disease)    a. s/p prior stenting of RCA and OM2 in 2006 b. s/p STEMI in 10/2017 with stenting of mid-RCA and PDA with DES x2  . Chronic pain   . Diabetes mellitus (Dennis [name])   . Hyperlipidemia   . Hypertension   . STEMI (ST elevation myocardial infarction) (Dennis [name])   . Tobacco use     Past Surgical History:  Procedure Laterality Date  . ABDOMINAL AORTOGRAM W/LOWER EXTREMITY Bilateral 06/07/2019   Procedure:  ABDOMINAL AORTOGRAM W/LOWER EXTREMITY;  Surgeon: Elam Dutch, MD;  Location: [facility];  Service: Cardiovascular;  Laterality: Bilateral;  . CORONARY STENT INTERVENTION N/A 11/13/2017   Procedure: CORONARY STENT INTERVENTION;  Surgeon: Martinique, Peter M, MD;  Location: [facility];  Service: Cardiovascular;  Laterality: N/A;  . CORONARY/GRAFT ACUTE MI REVASCULARIZATION N/A 11/13/2017   Procedure: Coronary/Graft Acute MI Revascularization;  Surgeon: Martinique, Peter M, MD;  Location: [facility];  Service: Cardiovascular;  Laterality: N/A;  . FEMORAL-TIBIAL BYPASS GRAFT Right 07/01/2019   Procedure: Right  FEMORAL- POSTERIOR TIBIAL ARTERY BYPASS GRAFT with Composite PTFE and Reversed Saphenous Vein, RIGHT Common FEMORAL ENDARTERECTOMY with Profundoplasty;  Surgeon: Elam Dutch, MD;  Location: [facility];  Service: Vascular;  Laterality: Right;  . LEFT HEART CATH AND CORONARY ANGIOGRAPHY N/A 11/13/2017   Procedure: LEFT HEART CATH AND CORONARY ANGIOGRAPHY;  Surgeon: Martinique, Peter M, MD;  Location: [facility];  Service: Cardiovascular;  Laterality: N/A;  . TOTAL HIP ARTHROPLASTY Right 12/18/2017   Procedure: TOTAL HIP ARTHROPLASTY ANTERIOR APPROACH;  Surgeon: Rod Can, MD;  Location: [facility];  Service: Orthopedics;  Laterality: Right;    Current Outpatient Medications  Medication Sig Dispense Refill  . acetaminophen (TYLENOL) 500 MG tablet Take 1,000 mg by mouth every 6 (six) hours as needed for mild pain or headache.    Marland Kitchen apixaban (ELIQUIS) 5 MG TABS tablet Take 1 tablet (5 mg total) by mouth 2 (two) times daily. 60 tablet 0  . clopidogrel (PLAVIX) 75 MG tablet Take 1  tablet by mouth every day WITH BREAKFAST 90 tablet 2  . diazepam (VALIUM) 10 MG tablet Take 1 tablet by mouth at bedtime as needed for sleep.     . furosemide (LASIX) 20 MG tablet Take 20 mg by mouth daily as needed for fluid (swelling).     Marland Kitchen ibuprofen (ADVIL) 400 MG tablet Take 400 mg by mouth every  4 (four) hours as needed.    . linaclotide (LINZESS) 290 MCG CAPS capsule Take 290 mcg by mouth daily as needed (constipation).     Marland Kitchen lisinopril (ZESTRIL) 5 MG tablet Take 5 mg by mouth daily.    . Menthol, Topical Analgesic, (ICY HOT BACK EX) Apply 1 application topically every 6 (six) hours as needed (pain).    . metFORMIN (GLUCOPHAGE) 850 MG tablet Take 850 mg by mouth daily with breakfast.    . methocarbamol (ROBAXIN) 500 MG tablet Take 1 tablet (500 mg total) by mouth every 8 (eight) hours as needed for muscle spasms. 30 tablet 0  . metoprolol tartrate (LOPRESSOR) 25 MG tablet Take 1 tablet (25 mg total) by mouth 2 (two) times daily. 60 tablet 0  . nitroGLYCERIN (NITROSTAT) 0.4 MG SL tablet Place 1 tablet (0.4 mg total) under the tongue every 5 (five) minutes x 3 doses as needed for chest pain. 25 tablet 2  . oxyCODONE-acetaminophen (PERCOCET/ROXICET) 5-325 MG tablet Take 1-2 tablets by mouth every 6 (six) hours as needed for severe pain. 15 tablet 0  . rosuvastatin (CRESTOR) 20 MG tablet Take 1 tablet (20 mg total) by mouth daily. 30 tablet 12  . silver sulfADIAZINE (SILVADENE) 1 % cream Apply 1 application topically daily.    . TRESIBA FLEXTOUCH 200 UNIT/ML SOPN Inject 16 Units into the skin daily.    . vitamin B-12 1000 MCG tablet Take 1 tablet (1,000 mcg total) by mouth daily.     No current facility-administered medications for this visit.   Allergies:  Betadine [povidone iodine], Fish allergy, Iodine, Shellfish allergy, Sulfamethoxazole-trimethoprim, Gabapentin, Chlorhexidine, Trimethoprim, and Tramadol   Social History: The patient  reports that he quit smoking about 3 years ago. His smoking use included cigarettes. He started smoking about 3 years ago. He has never used smokeless tobacco. He reports that he does not drink alcohol and does not use drugs.   Family History: The patient's family history includes Heart disease in his father; Hypertension in his father.   ROS:  Please  see the history of present illness. Otherwise, complete review of systems is positive for {NONE DEFAULTED:18576::"none"}.  All other systems are reviewed and negative.   Physical Exam: VS:  There were no vitals taken for this visit., BMI There is no height or weight on file to calculate BMI.  Wt Readings from Last 3 Encounters:  01/25/20 220 lb (99.8 kg)  12/24/19 205 lb (93 kg)  11/14/19 205 lb (93 kg)    General: Patient appears comfortable at rest. HEENT: Conjunctiva and lids normal, oropharynx clear with moist mucosa. Neck: Supple, no elevated JVP or carotid bruits, no thyromegaly. Lungs: Clear to auscultation, nonlabored breathing at rest. Cardiac: Regular rate and rhythm, no S3 or significant systolic murmur, no pericardial rub. Abdomen: Soft, nontender, no hepatomegaly, bowel sounds present, no guarding or rebound. Extremities: No pitting edema, distal pulses 2+. Skin: Warm and dry. Musculoskeletal: No kyphosis. Neuropsychiatric: Alert and oriented x3, affect grossly appropriate.  ECG:  {EKG/Telemetry Strips Reviewed:[phone]}  Recent Labwork: No results found for requested labs within last 8760 hours.  Component Value Date/Time   CHOL 117 11/14/2017 0500   TRIG 72 11/14/2017 0500   HDL 33 (L) 11/14/2017 0500   CHOLHDL 3.5 11/14/2017 0500   VLDL 14 11/14/2017 0500   LDLCALC 70 11/14/2017 0500    Other Studies Reviewed Today:   Cardiac Catheterization: 10/2017  Mid RCA lesion is 100% stenosed.  Dist RCA lesion is 40% stenosed.  Prox RCA lesion is 50% stenosed.  Ost RPDA to RPDA lesion is 99% stenosed.  Prox LAD to Mid LAD lesion is 60% stenosed.  Ost 1st Diag lesion is 90% stenosed.  Ost 2nd Diag to 2nd Diag lesion is 85% stenosed.  Ost 2nd Mrg to 2nd Mrg lesion is 40% stenosed.  Ost Cx to Prox Cx lesion is 40% stenosed.  Ost 1st Mrg to 1st Mrg lesion is 70% stenosed.  The left ventricular systolic function is normal.  LV end diastolic  pressure is mildly elevated.  The left ventricular ejection fraction is 55-65% by visual estimate.  Post intervention, there is a 0% residual stenosis.  A drug-eluting stent was successfully placed using a STENT SYNERGY DES 2.25X28.  Post intervention, there is a 0% residual stenosis.  A drug-eluting stent was successfully placed using a STENT SYNERGY DES 3X20.  1. 3 vessel obstructive CAD. - diffuse 60% proximal to mid LAD with severe disease in small diagonal branches. - 70% OM1. Stent in OM 2 is diffusely diseased but patent - 100% mid RCA and 99% PDA 2. Good LV function 3. Mildly elevated LVEDP 4. Successful stenting of the PDA and mid RCA with DES x 2. Diffusely diseased and heavily calcified RCA.  Plan: DAPT indefinitely. Aggressive risk factor modification and smoking cessation.   Echocardiogram: 01/2018 Study Conclusions  - Left ventricle: The cavity size was normal. Systolic function was normal. The estimated ejection fraction was in the range of 55% to 60%. Possible mild hypokinesis of the basal-midinferior myocardium. - Left atrium: The atrium was mildly dilated.   Lower Extremity Angiography: 05/2019 Operative findings: #1 occlusion right superficial femoral-popliteal artery with two-vessel peroneal posterior tibial artery runoff to the right foot  2. Inline flow left leg with several areas of 20 to 50% stenosis throughout the left superficial femoral artery three-vessel runoff left foot  Operative details: After team informed consent, the patient was taken the [place] lab. The patient's placed supine position on the angios table. Both groins were prepped and draped in usual sterile fashion. Local anesthesia was infiltrated over the left common femoral artery. Ultrasound was used to identify the left common femoral artery and femoral bifurcation. An introducer needle was used to cannulate the left common femoral artery and a 035 versa core wire  threaded up the abdominal aorta under fluoroscopic guidance. Next a 5 French sheath placed over the guidewire in the left common femoral artery. This was thoroughly flushed with heparinized saline. 5 French pigtail catheter was then advanced over the guidewire in the abdominal aorta and abdominal aortogram obtained in AP projection. Left and right renal arteries are patent. The infrarenal abdominal aorta is patent. The left and right common external and internal iliac arteries are all patent.  Next the pigtail catheter was pulled down just above the aortic bifurcation and bilateral oblique views of the pelvis were obtained to remove overlying orthopedic hardware which was obscuring the distal right external iliac and common femoral artery.  This showed the right common femoral artery is widely patent. The profunda femoris is patent. The SFA is occluded at its  origin. The left common femoral proximal SFA and profunda are all patent.  Next bilateral lower extremity runoff views were obtained through the pigtail catheter.  In the right lower extremity, the right common femoral profunda femoris is patent. The right superficial femoral artery is occluded. The popliteal artery is occluded. There is reconstitution of the origin of the peroneal and posterior tibial arteries via profunda collaterals. The posterior tibial artery is the dominant runoff vessel to the right foot.  In the left lower extremity the left common femoral profunda femoris and superficial femoral arteries are patent. There are multiple segments in the left superficial femoral artery ranging from 25 to 50% stenosis. However there is inline flow to the left foot. The left popliteal artery is patent. There is three-vessel runoff to the left foot. There is slightly delayed flow in the tibial vessels to the left foot.  At this point the pigtail catheter was removed over guidewire. The 5 French sheath was left in place to  pulled the holding area. The patient tolerated procedure well and there were no complications.  Operative management: The patient will be scheduled for cardiac risk gratification in the near future as well as bilateral lower extremity vein mapping in preparation for a right femoral to posterior tibial artery bypass for limb salvage and his nonhealing wound on his right foot. All the findings and procedure details were discussed with the patient's wife by phone today.  Assessment and Plan:  1. CAD in native artery   2. Essential hypertension   3. Hyperlipidemia LDL goal <70   4. PAD (peripheral artery disease) (HCC)      Medication Adjustments/Labs and Tests Ordered: Current medicines are reviewed at length with the patient today.  Concerns regarding medicines are outlined above.   Disposition: Follow-up with ***  Signed, Levell July, NP 02/21/2021 10:02 PM    [facility] [address], [place], [place] [address] Phone: [phone]; Fax: [phone]

## 2021-02-22 ENCOUNTER — Ambulatory Visit: Payer: Medicare HMO | Admitting: Family Medicine

## 2021-02-22 DIAGNOSIS — E785 Hyperlipidemia, unspecified: Secondary | ICD-10-CM

## 2021-02-22 DIAGNOSIS — I10 Essential (primary) hypertension: Secondary | ICD-10-CM

## 2021-02-22 DIAGNOSIS — I2510 Atherosclerotic heart disease of native coronary artery without angina pectoris: Secondary | ICD-10-CM

## 2021-02-22 DIAGNOSIS — I739 Peripheral vascular disease, unspecified: Secondary | ICD-10-CM

## 2021-03-08 DIAGNOSIS — I739 Peripheral vascular disease, unspecified: Secondary | ICD-10-CM | POA: Diagnosis not present

## 2021-03-08 DIAGNOSIS — F17210 Nicotine dependence, cigarettes, uncomplicated: Secondary | ICD-10-CM | POA: Diagnosis not present

## 2021-03-08 DIAGNOSIS — I25119 Atherosclerotic heart disease of native coronary artery with unspecified angina pectoris: Secondary | ICD-10-CM | POA: Diagnosis not present

## 2021-03-08 DIAGNOSIS — E1165 Type 2 diabetes mellitus with hyperglycemia: Secondary | ICD-10-CM | POA: Diagnosis not present

## 2021-03-08 DIAGNOSIS — I10 Essential (primary) hypertension: Secondary | ICD-10-CM | POA: Diagnosis not present

## 2021-03-08 DIAGNOSIS — Z299 Encounter for prophylactic measures, unspecified: Secondary | ICD-10-CM | POA: Diagnosis not present

## 2021-03-08 DIAGNOSIS — I4891 Unspecified atrial fibrillation: Secondary | ICD-10-CM | POA: Diagnosis not present

## 2021-04-28 DIAGNOSIS — F1290 Cannabis use, unspecified, uncomplicated: Secondary | ICD-10-CM | POA: Diagnosis not present

## 2021-04-28 DIAGNOSIS — Z91013 Allergy to seafood: Secondary | ICD-10-CM | POA: Diagnosis not present

## 2021-04-28 DIAGNOSIS — I440 Atrioventricular block, first degree: Secondary | ICD-10-CM | POA: Diagnosis not present

## 2021-04-28 DIAGNOSIS — F332 Major depressive disorder, recurrent severe without psychotic features: Secondary | ICD-10-CM | POA: Diagnosis not present

## 2021-04-28 DIAGNOSIS — I252 Old myocardial infarction: Secondary | ICD-10-CM | POA: Diagnosis not present

## 2021-04-28 DIAGNOSIS — Z888 Allergy status to other drugs, medicaments and biological substances status: Secondary | ICD-10-CM | POA: Diagnosis not present

## 2021-04-28 DIAGNOSIS — E119 Type 2 diabetes mellitus without complications: Secondary | ICD-10-CM | POA: Diagnosis not present

## 2021-04-28 DIAGNOSIS — S79911A Unspecified injury of right hip, initial encounter: Secondary | ICD-10-CM | POA: Diagnosis not present

## 2021-04-28 DIAGNOSIS — I10 Essential (primary) hypertension: Secondary | ICD-10-CM | POA: Diagnosis not present

## 2021-04-28 DIAGNOSIS — F22 Delusional disorders: Secondary | ICD-10-CM | POA: Diagnosis not present

## 2021-04-28 DIAGNOSIS — F322 Major depressive disorder, single episode, severe without psychotic features: Secondary | ICD-10-CM | POA: Diagnosis not present

## 2021-04-28 DIAGNOSIS — M79651 Pain in right thigh: Secondary | ICD-10-CM | POA: Diagnosis not present

## 2021-04-28 DIAGNOSIS — Z743 Need for continuous supervision: Secondary | ICD-10-CM | POA: Diagnosis not present

## 2021-04-28 DIAGNOSIS — R0689 Other abnormalities of breathing: Secondary | ICD-10-CM | POA: Diagnosis not present

## 2021-04-28 DIAGNOSIS — R45851 Suicidal ideations: Secondary | ICD-10-CM | POA: Diagnosis not present

## 2021-04-28 DIAGNOSIS — M25551 Pain in right hip: Secondary | ICD-10-CM | POA: Diagnosis not present

## 2021-04-28 DIAGNOSIS — R739 Hyperglycemia, unspecified: Secondary | ICD-10-CM | POA: Diagnosis not present

## 2021-04-28 DIAGNOSIS — M79671 Pain in right foot: Secondary | ICD-10-CM | POA: Diagnosis not present

## 2021-04-28 DIAGNOSIS — M25572 Pain in left ankle and joints of left foot: Secondary | ICD-10-CM | POA: Diagnosis not present

## 2021-04-28 DIAGNOSIS — R000 Tachycardia, unspecified: Secondary | ICD-10-CM | POA: Diagnosis not present

## 2021-04-28 DIAGNOSIS — F411 Generalized anxiety disorder: Secondary | ICD-10-CM | POA: Diagnosis not present

## 2021-04-28 DIAGNOSIS — S7001XA Contusion of right hip, initial encounter: Secondary | ICD-10-CM | POA: Diagnosis not present

## 2021-04-28 DIAGNOSIS — G4700 Insomnia, unspecified: Secondary | ICD-10-CM | POA: Diagnosis not present

## 2021-04-28 DIAGNOSIS — F17210 Nicotine dependence, cigarettes, uncomplicated: Secondary | ICD-10-CM | POA: Diagnosis not present

## 2021-04-28 DIAGNOSIS — S7002XA Contusion of left hip, initial encounter: Secondary | ICD-10-CM | POA: Diagnosis not present

## 2021-04-28 DIAGNOSIS — W109XXA Fall (on) (from) unspecified stairs and steps, initial encounter: Secondary | ICD-10-CM | POA: Diagnosis not present

## 2021-04-28 DIAGNOSIS — F329 Major depressive disorder, single episode, unspecified: Secondary | ICD-10-CM | POA: Diagnosis not present

## 2021-04-28 DIAGNOSIS — F323 Major depressive disorder, single episode, severe with psychotic features: Secondary | ICD-10-CM | POA: Diagnosis not present

## 2021-06-01 DIAGNOSIS — Z041 Encounter for examination and observation following transport accident: Secondary | ICD-10-CM | POA: Diagnosis not present

## 2021-06-01 DIAGNOSIS — R9431 Abnormal electrocardiogram [ECG] [EKG]: Secondary | ICD-10-CM | POA: Diagnosis not present

## 2021-06-01 DIAGNOSIS — F322 Major depressive disorder, single episode, severe without psychotic features: Secondary | ICD-10-CM | POA: Diagnosis not present

## 2021-06-01 DIAGNOSIS — R930 Abnormal findings on diagnostic imaging of skull and head, not elsewhere classified: Secondary | ICD-10-CM | POA: Diagnosis not present

## 2021-06-01 DIAGNOSIS — E119 Type 2 diabetes mellitus without complications: Secondary | ICD-10-CM | POA: Diagnosis not present

## 2021-06-01 DIAGNOSIS — E1122 Type 2 diabetes mellitus with diabetic chronic kidney disease: Secondary | ICD-10-CM | POA: Diagnosis not present

## 2021-06-01 DIAGNOSIS — R079 Chest pain, unspecified: Secondary | ICD-10-CM | POA: Diagnosis not present

## 2021-06-01 DIAGNOSIS — M542 Cervicalgia: Secondary | ICD-10-CM | POA: Diagnosis not present

## 2021-06-01 DIAGNOSIS — M79605 Pain in left leg: Secondary | ICD-10-CM | POA: Diagnosis not present

## 2021-06-01 DIAGNOSIS — D72829 Elevated white blood cell count, unspecified: Secondary | ICD-10-CM | POA: Diagnosis not present

## 2021-06-01 DIAGNOSIS — R0602 Shortness of breath: Secondary | ICD-10-CM | POA: Diagnosis not present

## 2021-06-01 DIAGNOSIS — W19XXXA Unspecified fall, initial encounter: Secondary | ICD-10-CM | POA: Diagnosis not present

## 2021-06-01 DIAGNOSIS — R000 Tachycardia, unspecified: Secondary | ICD-10-CM | POA: Diagnosis not present

## 2021-06-01 DIAGNOSIS — R609 Edema, unspecified: Secondary | ICD-10-CM | POA: Diagnosis not present

## 2021-06-01 DIAGNOSIS — I493 Ventricular premature depolarization: Secondary | ICD-10-CM | POA: Diagnosis not present

## 2021-06-01 DIAGNOSIS — I129 Hypertensive chronic kidney disease with stage 1 through stage 4 chronic kidney disease, or unspecified chronic kidney disease: Secondary | ICD-10-CM | POA: Diagnosis not present

## 2021-06-01 DIAGNOSIS — E785 Hyperlipidemia, unspecified: Secondary | ICD-10-CM | POA: Diagnosis not present

## 2021-06-01 DIAGNOSIS — S0990XA Unspecified injury of head, initial encounter: Secondary | ICD-10-CM | POA: Diagnosis not present

## 2021-06-01 DIAGNOSIS — S8992XA Unspecified injury of left lower leg, initial encounter: Secondary | ICD-10-CM | POA: Diagnosis not present

## 2021-06-01 DIAGNOSIS — I480 Paroxysmal atrial fibrillation: Secondary | ICD-10-CM | POA: Diagnosis not present

## 2021-06-01 DIAGNOSIS — I440 Atrioventricular block, first degree: Secondary | ICD-10-CM | POA: Diagnosis not present

## 2021-06-01 DIAGNOSIS — S299XXA Unspecified injury of thorax, initial encounter: Secondary | ICD-10-CM | POA: Diagnosis not present

## 2021-06-01 DIAGNOSIS — S060X9A Concussion with loss of consciousness of unspecified duration, initial encounter: Secondary | ICD-10-CM | POA: Diagnosis not present

## 2021-06-01 DIAGNOSIS — S069X9A Unspecified intracranial injury with loss of consciousness of unspecified duration, initial encounter: Secondary | ICD-10-CM | POA: Diagnosis not present

## 2021-06-01 DIAGNOSIS — E1151 Type 2 diabetes mellitus with diabetic peripheral angiopathy without gangrene: Secondary | ICD-10-CM | POA: Diagnosis not present

## 2021-06-01 DIAGNOSIS — I4891 Unspecified atrial fibrillation: Secondary | ICD-10-CM | POA: Diagnosis not present

## 2021-06-01 DIAGNOSIS — G8929 Other chronic pain: Secondary | ICD-10-CM | POA: Diagnosis not present

## 2021-06-01 DIAGNOSIS — S3991XA Unspecified injury of abdomen, initial encounter: Secondary | ICD-10-CM | POA: Diagnosis not present

## 2021-06-01 DIAGNOSIS — S39012A Strain of muscle, fascia and tendon of lower back, initial encounter: Secondary | ICD-10-CM | POA: Diagnosis not present

## 2021-06-01 DIAGNOSIS — S79912A Unspecified injury of left hip, initial encounter: Secondary | ICD-10-CM | POA: Diagnosis not present

## 2021-06-01 DIAGNOSIS — M545 Low back pain, unspecified: Secondary | ICD-10-CM | POA: Diagnosis not present

## 2021-06-01 DIAGNOSIS — Z794 Long term (current) use of insulin: Secondary | ICD-10-CM | POA: Diagnosis not present

## 2021-06-01 DIAGNOSIS — S065X0A Traumatic subdural hemorrhage without loss of consciousness, initial encounter: Secondary | ICD-10-CM | POA: Diagnosis not present

## 2021-06-01 DIAGNOSIS — R059 Cough, unspecified: Secondary | ICD-10-CM | POA: Diagnosis not present

## 2021-06-01 DIAGNOSIS — I10 Essential (primary) hypertension: Secondary | ICD-10-CM | POA: Diagnosis not present

## 2021-06-01 DIAGNOSIS — S161XXA Strain of muscle, fascia and tendon at neck level, initial encounter: Secondary | ICD-10-CM | POA: Diagnosis not present

## 2021-06-01 DIAGNOSIS — S134XXA Sprain of ligaments of cervical spine, initial encounter: Secondary | ICD-10-CM | POA: Diagnosis not present

## 2021-06-01 DIAGNOSIS — S8012XA Contusion of left lower leg, initial encounter: Secondary | ICD-10-CM | POA: Diagnosis not present

## 2021-06-01 DIAGNOSIS — Z0489 Encounter for examination and observation for other specified reasons: Secondary | ICD-10-CM | POA: Diagnosis not present

## 2021-06-04 ENCOUNTER — Other Ambulatory Visit: Payer: Self-pay

## 2021-06-04 NOTE — Patient Outreach (Signed)
[name]) Care Management  06/04/2021  Dennis [name] 15-Jul-1946 [id number]     Transition of Care Referral  Referral Date: 06/04/2021 Referral [name] Discharge Report Date of Discharge: 06/03/2021 Facility: [name]     Outreach attempt # 1 to patient. No answer after several rings and unable to leave voicemail message.    Plan: RN CM will make outreach attempt to patient within 3-4 business days. RN CM will send unsuccessful outreach letter to patient.   [name], RN,BSN,CCM [name] Telephonic Care Management Coordinator Direct Phone: [phone] Toll Free: [phone] Fax: [phone]

## 2021-06-07 ENCOUNTER — Other Ambulatory Visit: Payer: Self-pay

## 2021-06-07 DIAGNOSIS — F17200 Nicotine dependence, unspecified, uncomplicated: Secondary | ICD-10-CM | POA: Diagnosis not present

## 2021-06-07 DIAGNOSIS — R531 Weakness: Secondary | ICD-10-CM | POA: Diagnosis not present

## 2021-06-07 DIAGNOSIS — R69 Illness, unspecified: Secondary | ICD-10-CM | POA: Diagnosis not present

## 2021-06-07 DIAGNOSIS — F0781 Postconcussional syndrome: Secondary | ICD-10-CM | POA: Diagnosis not present

## 2021-06-07 DIAGNOSIS — G319 Degenerative disease of nervous system, unspecified: Secondary | ICD-10-CM | POA: Diagnosis not present

## 2021-06-07 DIAGNOSIS — R4182 Altered mental status, unspecified: Secondary | ICD-10-CM | POA: Diagnosis not present

## 2021-06-07 DIAGNOSIS — I10 Essential (primary) hypertension: Secondary | ICD-10-CM | POA: Diagnosis not present

## 2021-06-07 DIAGNOSIS — E1165 Type 2 diabetes mellitus with hyperglycemia: Secondary | ICD-10-CM | POA: Diagnosis not present

## 2021-06-07 DIAGNOSIS — F1290 Cannabis use, unspecified, uncomplicated: Secondary | ICD-10-CM | POA: Diagnosis not present

## 2021-06-07 DIAGNOSIS — I700 Atherosclerosis of aorta: Secondary | ICD-10-CM | POA: Diagnosis not present

## 2021-06-07 DIAGNOSIS — R42 Dizziness and giddiness: Secondary | ICD-10-CM | POA: Diagnosis not present

## 2021-06-07 DIAGNOSIS — Z299 Encounter for prophylactic measures, unspecified: Secondary | ICD-10-CM | POA: Diagnosis not present

## 2021-06-07 DIAGNOSIS — Z041 Encounter for examination and observation following transport accident: Secondary | ICD-10-CM | POA: Diagnosis not present

## 2021-06-07 DIAGNOSIS — I6789 Other cerebrovascular disease: Secondary | ICD-10-CM | POA: Diagnosis not present

## 2021-06-07 DIAGNOSIS — S0990XA Unspecified injury of head, initial encounter: Secondary | ICD-10-CM | POA: Diagnosis not present

## 2021-06-07 DIAGNOSIS — I4891 Unspecified atrial fibrillation: Secondary | ICD-10-CM | POA: Diagnosis not present

## 2021-06-07 NOTE — Patient Outreach (Signed)
[name]) Care Management  06/07/2021  Dennis [name] 01-23-1946 BP:[id number]   Transition of Care Referral   Referral Date: 06/04/2021 Referral [place] Discharge Report Date of Discharge: 06/03/2021 Facility: [facility]   Outreach attempt # 2 to patient. No answer after multiple rings.     Plan: RN CM will make outreach attempt to patient within 3-4 business days.    [name], RN,BSN,CCM [place] Management Telephonic Care Management Coordinator Direct Phone: [phone] Toll Free: [phone] Fax: [phone]

## 2021-06-08 ENCOUNTER — Other Ambulatory Visit: Payer: Self-pay

## 2021-06-08 NOTE — Patient Outreach (Signed)
[facility]) Care Management  06/08/2021  JAMINE HIGHFILL 03/16/1946 [id number]   Transition of Care Referral   Referral Date: 06/04/2021 Referral [place] Discharge Report Date of Discharge: 06/03/2021 Facility: [facility]   Unsuccessful outreach attempt to patient. No answer and unable to leave message.   Plan: RN CM will make outreach attempt to patient within 3-4 wks if no response from letter mailed to patient.  [name], RN,BSN,CCM [place] Management Telephonic Care Management Coordinator Direct Phone: [phone] Toll Free: [phone] Fax: [phone]

## 2021-06-09 ENCOUNTER — Ambulatory Visit: Payer: Self-pay

## 2021-06-24 DIAGNOSIS — I10 Essential (primary) hypertension: Secondary | ICD-10-CM | POA: Diagnosis not present

## 2021-06-24 DIAGNOSIS — R413 Other amnesia: Secondary | ICD-10-CM | POA: Diagnosis not present

## 2021-06-24 DIAGNOSIS — Z299 Encounter for prophylactic measures, unspecified: Secondary | ICD-10-CM | POA: Diagnosis not present

## 2021-06-24 DIAGNOSIS — I739 Peripheral vascular disease, unspecified: Secondary | ICD-10-CM | POA: Diagnosis not present

## 2021-06-24 DIAGNOSIS — E1165 Type 2 diabetes mellitus with hyperglycemia: Secondary | ICD-10-CM | POA: Diagnosis not present

## 2021-06-28 ENCOUNTER — Other Ambulatory Visit: Payer: Self-pay

## 2021-06-28 NOTE — Patient Outreach (Signed)
[facility]) Care Management  06/28/2021  BRNADON EOFF 1945-12-03 [id number]   Transition of Care Referral   Referral Date: 06/04/2021 Referral [place] Discharge Report Date of Discharge: 06/03/2021 Facility: [facility]    Multiple attempts to establish contact with patient without success. No response from letter mailed to patient. Case is being closed at this time.      Plan: RN CM will close case.   [name], RN,BSN,CCM [place] Management Telephonic Care Management Coordinator Direct Phone: [phone] Toll Free: [phone] Fax: [phone]

## 2021-06-29 ENCOUNTER — Ambulatory Visit: Payer: Self-pay

## 2021-08-23 ENCOUNTER — Other Ambulatory Visit (HOSPITAL_COMMUNITY): Payer: Self-pay | Admitting: Neurology

## 2021-08-23 DIAGNOSIS — G309 Alzheimer's disease, unspecified: Secondary | ICD-10-CM | POA: Diagnosis not present

## 2021-08-23 DIAGNOSIS — E559 Vitamin D deficiency, unspecified: Secondary | ICD-10-CM | POA: Diagnosis not present

## 2021-08-23 DIAGNOSIS — R413 Other amnesia: Secondary | ICD-10-CM | POA: Diagnosis not present

## 2021-08-23 DIAGNOSIS — M79604 Pain in right leg: Secondary | ICD-10-CM | POA: Diagnosis not present

## 2021-08-23 DIAGNOSIS — Z79899 Other long term (current) drug therapy: Secondary | ICD-10-CM | POA: Diagnosis not present

## 2021-08-23 DIAGNOSIS — F330 Major depressive disorder, recurrent, mild: Secondary | ICD-10-CM | POA: Diagnosis not present

## 2021-08-31 ENCOUNTER — Encounter (HOSPITAL_COMMUNITY): Payer: Self-pay

## 2021-08-31 ENCOUNTER — Ambulatory Visit (HOSPITAL_COMMUNITY): Payer: Medicare HMO

## 2021-09-22 DIAGNOSIS — Z299 Encounter for prophylactic measures, unspecified: Secondary | ICD-10-CM | POA: Diagnosis not present

## 2021-09-22 DIAGNOSIS — L98499 Non-pressure chronic ulcer of skin of other sites with unspecified severity: Secondary | ICD-10-CM | POA: Diagnosis not present

## 2021-09-22 DIAGNOSIS — I739 Peripheral vascular disease, unspecified: Secondary | ICD-10-CM | POA: Diagnosis not present

## 2021-09-22 DIAGNOSIS — L0390 Cellulitis, unspecified: Secondary | ICD-10-CM | POA: Diagnosis not present

## 2021-09-22 DIAGNOSIS — I10 Essential (primary) hypertension: Secondary | ICD-10-CM | POA: Diagnosis not present

## 2021-09-28 DIAGNOSIS — L02611 Cutaneous abscess of right foot: Secondary | ICD-10-CM | POA: Diagnosis not present

## 2021-09-28 DIAGNOSIS — E1140 Type 2 diabetes mellitus with diabetic neuropathy, unspecified: Secondary | ICD-10-CM | POA: Diagnosis not present

## 2021-09-28 DIAGNOSIS — S81801A Unspecified open wound, right lower leg, initial encounter: Secondary | ICD-10-CM | POA: Diagnosis not present

## 2021-09-28 DIAGNOSIS — S81801D Unspecified open wound, right lower leg, subsequent encounter: Secondary | ICD-10-CM | POA: Diagnosis not present

## 2021-09-30 DIAGNOSIS — E1140 Type 2 diabetes mellitus with diabetic neuropathy, unspecified: Secondary | ICD-10-CM | POA: Diagnosis not present

## 2021-09-30 DIAGNOSIS — F17210 Nicotine dependence, cigarettes, uncomplicated: Secondary | ICD-10-CM | POA: Diagnosis not present

## 2021-09-30 DIAGNOSIS — E1151 Type 2 diabetes mellitus with diabetic peripheral angiopathy without gangrene: Secondary | ICD-10-CM | POA: Diagnosis not present

## 2021-09-30 DIAGNOSIS — I10 Essential (primary) hypertension: Secondary | ICD-10-CM | POA: Diagnosis not present

## 2021-09-30 DIAGNOSIS — Z4800 Encounter for change or removal of nonsurgical wound dressing: Secondary | ICD-10-CM | POA: Diagnosis not present

## 2021-09-30 DIAGNOSIS — E7800 Pure hypercholesterolemia, unspecified: Secondary | ICD-10-CM | POA: Diagnosis not present

## 2021-09-30 DIAGNOSIS — L02611 Cutaneous abscess of right foot: Secondary | ICD-10-CM | POA: Diagnosis not present

## 2021-09-30 DIAGNOSIS — I70238 Atherosclerosis of native arteries of right leg with ulceration of other part of lower right leg: Secondary | ICD-10-CM | POA: Diagnosis not present

## 2021-09-30 DIAGNOSIS — L97812 Non-pressure chronic ulcer of other part of right lower leg with fat layer exposed: Secondary | ICD-10-CM | POA: Diagnosis not present

## 2021-10-04 DIAGNOSIS — F17210 Nicotine dependence, cigarettes, uncomplicated: Secondary | ICD-10-CM | POA: Diagnosis not present

## 2021-10-04 DIAGNOSIS — E1151 Type 2 diabetes mellitus with diabetic peripheral angiopathy without gangrene: Secondary | ICD-10-CM | POA: Diagnosis not present

## 2021-10-04 DIAGNOSIS — L02611 Cutaneous abscess of right foot: Secondary | ICD-10-CM | POA: Diagnosis not present

## 2021-10-04 DIAGNOSIS — Z4800 Encounter for change or removal of nonsurgical wound dressing: Secondary | ICD-10-CM | POA: Diagnosis not present

## 2021-10-04 DIAGNOSIS — I10 Essential (primary) hypertension: Secondary | ICD-10-CM | POA: Diagnosis not present

## 2021-10-04 DIAGNOSIS — E1140 Type 2 diabetes mellitus with diabetic neuropathy, unspecified: Secondary | ICD-10-CM | POA: Diagnosis not present

## 2021-10-04 DIAGNOSIS — L97812 Non-pressure chronic ulcer of other part of right lower leg with fat layer exposed: Secondary | ICD-10-CM | POA: Diagnosis not present

## 2021-10-04 DIAGNOSIS — I70238 Atherosclerosis of native arteries of right leg with ulceration of other part of lower right leg: Secondary | ICD-10-CM | POA: Diagnosis not present

## 2021-10-04 DIAGNOSIS — E7800 Pure hypercholesterolemia, unspecified: Secondary | ICD-10-CM | POA: Diagnosis not present

## 2021-10-06 DIAGNOSIS — S81801D Unspecified open wound, right lower leg, subsequent encounter: Secondary | ICD-10-CM | POA: Diagnosis not present

## 2021-10-06 DIAGNOSIS — I70201 Unspecified atherosclerosis of native arteries of extremities, right leg: Secondary | ICD-10-CM | POA: Diagnosis not present

## 2021-10-06 DIAGNOSIS — I739 Peripheral vascular disease, unspecified: Secondary | ICD-10-CM | POA: Diagnosis not present

## 2021-10-06 DIAGNOSIS — F17210 Nicotine dependence, cigarettes, uncomplicated: Secondary | ICD-10-CM | POA: Diagnosis not present

## 2021-10-06 DIAGNOSIS — L02621 Furuncle of right foot: Secondary | ICD-10-CM | POA: Diagnosis not present

## 2021-10-06 DIAGNOSIS — E119 Type 2 diabetes mellitus without complications: Secondary | ICD-10-CM | POA: Diagnosis not present

## 2021-10-06 DIAGNOSIS — E1140 Type 2 diabetes mellitus with diabetic neuropathy, unspecified: Secondary | ICD-10-CM | POA: Diagnosis not present

## 2021-10-06 DIAGNOSIS — L02631 Carbuncle of right foot: Secondary | ICD-10-CM | POA: Diagnosis not present

## 2021-10-06 DIAGNOSIS — I252 Old myocardial infarction: Secondary | ICD-10-CM | POA: Diagnosis not present

## 2021-10-06 DIAGNOSIS — R079 Chest pain, unspecified: Secondary | ICD-10-CM | POA: Diagnosis not present

## 2021-10-08 DIAGNOSIS — R410 Disorientation, unspecified: Secondary | ICD-10-CM | POA: Diagnosis not present

## 2021-10-08 DIAGNOSIS — T82868A Thrombosis of vascular prosthetic devices, implants and grafts, initial encounter: Secondary | ICD-10-CM | POA: Diagnosis not present

## 2021-10-08 DIAGNOSIS — E11621 Type 2 diabetes mellitus with foot ulcer: Secondary | ICD-10-CM | POA: Diagnosis not present

## 2021-10-08 DIAGNOSIS — I10 Essential (primary) hypertension: Secondary | ICD-10-CM | POA: Diagnosis not present

## 2021-10-08 DIAGNOSIS — F329 Major depressive disorder, single episode, unspecified: Secondary | ICD-10-CM | POA: Diagnosis not present

## 2021-10-08 DIAGNOSIS — E1151 Type 2 diabetes mellitus with diabetic peripheral angiopathy without gangrene: Secondary | ICD-10-CM | POA: Diagnosis not present

## 2021-10-08 DIAGNOSIS — L97519 Non-pressure chronic ulcer of other part of right foot with unspecified severity: Secondary | ICD-10-CM | POA: Diagnosis not present

## 2021-10-08 DIAGNOSIS — Z01810 Encounter for preprocedural cardiovascular examination: Secondary | ICD-10-CM | POA: Diagnosis not present

## 2021-10-08 DIAGNOSIS — I70221 Atherosclerosis of native arteries of extremities with rest pain, right leg: Secondary | ICD-10-CM | POA: Diagnosis not present

## 2021-10-08 DIAGNOSIS — I2510 Atherosclerotic heart disease of native coronary artery without angina pectoris: Secondary | ICD-10-CM | POA: Diagnosis not present

## 2021-10-08 DIAGNOSIS — I70421 Atherosclerosis of autologous vein bypass graft(s) of the extremities with rest pain, right leg: Secondary | ICD-10-CM | POA: Diagnosis not present

## 2021-10-08 DIAGNOSIS — I739 Peripheral vascular disease, unspecified: Secondary | ICD-10-CM | POA: Diagnosis not present

## 2021-10-08 DIAGNOSIS — L97509 Non-pressure chronic ulcer of other part of unspecified foot with unspecified severity: Secondary | ICD-10-CM | POA: Diagnosis not present

## 2021-10-08 DIAGNOSIS — L089 Local infection of the skin and subcutaneous tissue, unspecified: Secondary | ICD-10-CM | POA: Diagnosis not present

## 2021-10-08 DIAGNOSIS — T82392A Other mechanical complication of femoral arterial graft (bypass), initial encounter: Secondary | ICD-10-CM | POA: Diagnosis not present

## 2021-10-08 DIAGNOSIS — L03115 Cellulitis of right lower limb: Secondary | ICD-10-CM | POA: Diagnosis not present

## 2021-10-08 DIAGNOSIS — R9431 Abnormal electrocardiogram [ECG] [EKG]: Secondary | ICD-10-CM | POA: Diagnosis not present

## 2021-10-08 DIAGNOSIS — T82312A Breakdown (mechanical) of femoral arterial graft (bypass), initial encounter: Secondary | ICD-10-CM | POA: Diagnosis not present

## 2021-10-08 DIAGNOSIS — I493 Ventricular premature depolarization: Secondary | ICD-10-CM | POA: Diagnosis not present

## 2021-10-08 DIAGNOSIS — I6523 Occlusion and stenosis of bilateral carotid arteries: Secondary | ICD-10-CM | POA: Diagnosis not present

## 2021-10-08 DIAGNOSIS — I743 Embolism and thrombosis of arteries of the lower extremities: Secondary | ICD-10-CM | POA: Diagnosis not present

## 2021-10-08 DIAGNOSIS — F03911 Unspecified dementia, unspecified severity, with agitation: Secondary | ICD-10-CM | POA: Diagnosis not present

## 2021-10-08 DIAGNOSIS — R600 Localized edema: Secondary | ICD-10-CM | POA: Diagnosis not present

## 2021-10-11 DIAGNOSIS — T82392A Other mechanical complication of femoral arterial graft (bypass), initial encounter: Secondary | ICD-10-CM | POA: Diagnosis not present

## 2021-10-20 DIAGNOSIS — Z09 Encounter for follow-up examination after completed treatment for conditions other than malignant neoplasm: Secondary | ICD-10-CM | POA: Diagnosis not present

## 2021-10-20 DIAGNOSIS — I10 Essential (primary) hypertension: Secondary | ICD-10-CM | POA: Diagnosis not present

## 2021-10-20 DIAGNOSIS — F17210 Nicotine dependence, cigarettes, uncomplicated: Secondary | ICD-10-CM | POA: Diagnosis not present

## 2021-10-20 DIAGNOSIS — L97509 Non-pressure chronic ulcer of other part of unspecified foot with unspecified severity: Secondary | ICD-10-CM | POA: Diagnosis not present

## 2021-10-20 DIAGNOSIS — E1165 Type 2 diabetes mellitus with hyperglycemia: Secondary | ICD-10-CM | POA: Diagnosis not present

## 2021-10-20 DIAGNOSIS — Z6824 Body mass index (BMI) 24.0-24.9, adult: Secondary | ICD-10-CM | POA: Diagnosis not present

## 2021-10-20 DIAGNOSIS — I25119 Atherosclerotic heart disease of native coronary artery with unspecified angina pectoris: Secondary | ICD-10-CM | POA: Diagnosis not present

## 2021-10-20 DIAGNOSIS — Z299 Encounter for prophylactic measures, unspecified: Secondary | ICD-10-CM | POA: Diagnosis not present

## 2021-10-20 DIAGNOSIS — E11621 Type 2 diabetes mellitus with foot ulcer: Secondary | ICD-10-CM | POA: Diagnosis not present

## 2021-10-29 DIAGNOSIS — I739 Peripheral vascular disease, unspecified: Secondary | ICD-10-CM | POA: Diagnosis not present

## 2022-01-16 DIAGNOSIS — I2510 Atherosclerotic heart disease of native coronary artery without angina pectoris: Secondary | ICD-10-CM | POA: Diagnosis not present

## 2022-01-16 DIAGNOSIS — K921 Melena: Secondary | ICD-10-CM | POA: Diagnosis not present

## 2022-01-21 DIAGNOSIS — I739 Peripheral vascular disease, unspecified: Secondary | ICD-10-CM | POA: Diagnosis not present

## 2022-01-21 DIAGNOSIS — I96 Gangrene, not elsewhere classified: Secondary | ICD-10-CM | POA: Diagnosis not present

## 2022-01-25 DIAGNOSIS — F17200 Nicotine dependence, unspecified, uncomplicated: Secondary | ICD-10-CM | POA: Diagnosis not present

## 2022-01-25 DIAGNOSIS — E119 Type 2 diabetes mellitus without complications: Secondary | ICD-10-CM | POA: Diagnosis not present

## 2022-01-25 DIAGNOSIS — F331 Major depressive disorder, recurrent, moderate: Secondary | ICD-10-CM | POA: Diagnosis not present

## 2022-01-25 DIAGNOSIS — I440 Atrioventricular block, first degree: Secondary | ICD-10-CM | POA: Diagnosis not present

## 2022-01-25 DIAGNOSIS — R260 Ataxic gait: Secondary | ICD-10-CM | POA: Diagnosis not present

## 2022-01-25 DIAGNOSIS — R262 Difficulty in walking, not elsewhere classified: Secondary | ICD-10-CM | POA: Diagnosis not present

## 2022-01-25 DIAGNOSIS — I491 Atrial premature depolarization: Secondary | ICD-10-CM | POA: Diagnosis not present

## 2022-01-25 DIAGNOSIS — I21A1 Myocardial infarction type 2: Secondary | ICD-10-CM | POA: Diagnosis not present

## 2022-01-25 DIAGNOSIS — I130 Hypertensive heart and chronic kidney disease with heart failure and stage 1 through stage 4 chronic kidney disease, or unspecified chronic kidney disease: Secondary | ICD-10-CM | POA: Diagnosis not present

## 2022-01-25 DIAGNOSIS — I739 Peripheral vascular disease, unspecified: Secondary | ICD-10-CM | POA: Diagnosis not present

## 2022-01-25 DIAGNOSIS — Z955 Presence of coronary angioplasty implant and graft: Secondary | ICD-10-CM | POA: Diagnosis not present

## 2022-01-25 DIAGNOSIS — Z741 Need for assistance with personal care: Secondary | ICD-10-CM | POA: Diagnosis not present

## 2022-01-25 DIAGNOSIS — I482 Chronic atrial fibrillation, unspecified: Secondary | ICD-10-CM | POA: Diagnosis not present

## 2022-01-25 DIAGNOSIS — I471 Supraventricular tachycardia: Secondary | ICD-10-CM | POA: Diagnosis not present

## 2022-01-25 DIAGNOSIS — I10 Essential (primary) hypertension: Secondary | ICD-10-CM | POA: Diagnosis not present

## 2022-01-25 DIAGNOSIS — E1122 Type 2 diabetes mellitus with diabetic chronic kidney disease: Secondary | ICD-10-CM | POA: Diagnosis not present

## 2022-01-25 DIAGNOSIS — R9431 Abnormal electrocardiogram [ECG] [EKG]: Secondary | ICD-10-CM | POA: Diagnosis not present

## 2022-01-25 DIAGNOSIS — I493 Ventricular premature depolarization: Secondary | ICD-10-CM | POA: Diagnosis not present

## 2022-01-25 DIAGNOSIS — Z7409 Other reduced mobility: Secondary | ICD-10-CM | POA: Diagnosis not present

## 2022-01-25 DIAGNOSIS — Z4781 Encounter for orthopedic aftercare following surgical amputation: Secondary | ICD-10-CM | POA: Diagnosis not present

## 2022-01-25 DIAGNOSIS — R4189 Other symptoms and signs involving cognitive functions and awareness: Secondary | ICD-10-CM | POA: Diagnosis not present

## 2022-01-25 DIAGNOSIS — M8618 Other acute osteomyelitis, other site: Secondary | ICD-10-CM | POA: Diagnosis not present

## 2022-01-25 DIAGNOSIS — M86171 Other acute osteomyelitis, right ankle and foot: Secondary | ICD-10-CM | POA: Diagnosis not present

## 2022-01-25 DIAGNOSIS — R419 Unspecified symptoms and signs involving cognitive functions and awareness: Secondary | ICD-10-CM | POA: Diagnosis not present

## 2022-01-25 DIAGNOSIS — I480 Paroxysmal atrial fibrillation: Secondary | ICD-10-CM | POA: Diagnosis not present

## 2022-01-25 DIAGNOSIS — I2510 Atherosclerotic heart disease of native coronary artery without angina pectoris: Secondary | ICD-10-CM | POA: Diagnosis not present

## 2022-01-25 DIAGNOSIS — Z20822 Contact with and (suspected) exposure to covid-19: Secondary | ICD-10-CM | POA: Diagnosis not present

## 2022-01-25 DIAGNOSIS — E0840 Diabetes mellitus due to underlying condition with diabetic neuropathy, unspecified: Secondary | ICD-10-CM | POA: Diagnosis not present

## 2022-01-25 DIAGNOSIS — L97901 Non-pressure chronic ulcer of unspecified part of unspecified lower leg limited to breakdown of skin: Secondary | ICD-10-CM | POA: Diagnosis not present

## 2022-01-25 DIAGNOSIS — R293 Abnormal posture: Secondary | ICD-10-CM | POA: Diagnosis not present

## 2022-01-25 DIAGNOSIS — N183 Chronic kidney disease, stage 3 unspecified: Secondary | ICD-10-CM | POA: Diagnosis not present

## 2022-01-25 DIAGNOSIS — I96 Gangrene, not elsewhere classified: Secondary | ICD-10-CM | POA: Diagnosis not present

## 2022-01-25 DIAGNOSIS — F1091 Alcohol use, unspecified, in remission: Secondary | ICD-10-CM | POA: Diagnosis not present

## 2022-01-25 DIAGNOSIS — I509 Heart failure, unspecified: Secondary | ICD-10-CM | POA: Diagnosis not present

## 2022-01-25 DIAGNOSIS — Z7901 Long term (current) use of anticoagulants: Secondary | ICD-10-CM | POA: Diagnosis not present

## 2022-01-25 DIAGNOSIS — M79604 Pain in right leg: Secondary | ICD-10-CM | POA: Diagnosis not present

## 2022-01-25 DIAGNOSIS — F062 Psychotic disorder with delusions due to known physiological condition: Secondary | ICD-10-CM | POA: Diagnosis not present

## 2022-01-25 DIAGNOSIS — I252 Old myocardial infarction: Secondary | ICD-10-CM | POA: Diagnosis not present

## 2022-01-25 DIAGNOSIS — R410 Disorientation, unspecified: Secondary | ICD-10-CM | POA: Diagnosis not present

## 2022-01-25 DIAGNOSIS — Z89611 Acquired absence of right leg above knee: Secondary | ICD-10-CM | POA: Diagnosis not present

## 2022-01-25 DIAGNOSIS — N179 Acute kidney failure, unspecified: Secondary | ICD-10-CM | POA: Diagnosis not present

## 2022-01-25 DIAGNOSIS — E1151 Type 2 diabetes mellitus with diabetic peripheral angiopathy without gangrene: Secondary | ICD-10-CM | POA: Diagnosis not present

## 2022-01-25 DIAGNOSIS — F05 Delirium due to known physiological condition: Secondary | ICD-10-CM | POA: Diagnosis not present

## 2022-01-25 DIAGNOSIS — F0390 Unspecified dementia without behavioral disturbance: Secondary | ICD-10-CM | POA: Diagnosis not present

## 2022-01-25 DIAGNOSIS — I70291 Other atherosclerosis of native arteries of extremities, right leg: Secondary | ICD-10-CM | POA: Diagnosis not present

## 2022-01-25 DIAGNOSIS — E785 Hyperlipidemia, unspecified: Secondary | ICD-10-CM | POA: Diagnosis not present

## 2022-01-25 DIAGNOSIS — G546 Phantom limb syndrome with pain: Secondary | ICD-10-CM | POA: Diagnosis not present

## 2022-01-25 DIAGNOSIS — E1152 Type 2 diabetes mellitus with diabetic peripheral angiopathy with gangrene: Secondary | ICD-10-CM | POA: Diagnosis not present

## 2022-01-25 DIAGNOSIS — R000 Tachycardia, unspecified: Secondary | ICD-10-CM | POA: Diagnosis not present

## 2022-01-26 DIAGNOSIS — Z89611 Acquired absence of right leg above knee: Secondary | ICD-10-CM | POA: Diagnosis not present

## 2022-01-27 DIAGNOSIS — Z89611 Acquired absence of right leg above knee: Secondary | ICD-10-CM | POA: Diagnosis not present

## 2022-01-30 DIAGNOSIS — M79604 Pain in right leg: Secondary | ICD-10-CM | POA: Diagnosis not present

## 2022-01-30 DIAGNOSIS — E785 Hyperlipidemia, unspecified: Secondary | ICD-10-CM | POA: Diagnosis not present

## 2022-01-30 DIAGNOSIS — I10 Essential (primary) hypertension: Secondary | ICD-10-CM | POA: Diagnosis not present

## 2022-01-30 DIAGNOSIS — F05 Delirium due to known physiological condition: Secondary | ICD-10-CM | POA: Diagnosis not present

## 2022-01-30 DIAGNOSIS — I480 Paroxysmal atrial fibrillation: Secondary | ICD-10-CM | POA: Diagnosis not present

## 2022-01-30 DIAGNOSIS — I739 Peripheral vascular disease, unspecified: Secondary | ICD-10-CM | POA: Diagnosis not present

## 2022-01-30 DIAGNOSIS — F1091 Alcohol use, unspecified, in remission: Secondary | ICD-10-CM | POA: Diagnosis not present

## 2022-01-30 DIAGNOSIS — R4189 Other symptoms and signs involving cognitive functions and awareness: Secondary | ICD-10-CM | POA: Diagnosis not present

## 2022-01-30 DIAGNOSIS — I2510 Atherosclerotic heart disease of native coronary artery without angina pectoris: Secondary | ICD-10-CM | POA: Diagnosis not present

## 2022-02-05 DIAGNOSIS — Z9861 Coronary angioplasty status: Secondary | ICD-10-CM | POA: Diagnosis not present

## 2022-02-05 DIAGNOSIS — R262 Difficulty in walking, not elsewhere classified: Secondary | ICD-10-CM | POA: Diagnosis not present

## 2022-02-05 DIAGNOSIS — R260 Ataxic gait: Secondary | ICD-10-CM | POA: Diagnosis not present

## 2022-02-05 DIAGNOSIS — E0840 Diabetes mellitus due to underlying condition with diabetic neuropathy, unspecified: Secondary | ICD-10-CM | POA: Diagnosis not present

## 2022-02-05 DIAGNOSIS — F331 Major depressive disorder, recurrent, moderate: Secondary | ICD-10-CM | POA: Diagnosis not present

## 2022-02-05 DIAGNOSIS — Z89611 Acquired absence of right leg above knee: Secondary | ICD-10-CM | POA: Diagnosis not present

## 2022-02-05 DIAGNOSIS — R6889 Other general symptoms and signs: Secondary | ICD-10-CM | POA: Diagnosis not present

## 2022-02-05 DIAGNOSIS — R296 Repeated falls: Secondary | ICD-10-CM | POA: Diagnosis not present

## 2022-02-05 DIAGNOSIS — I2510 Atherosclerotic heart disease of native coronary artery without angina pectoris: Secondary | ICD-10-CM | POA: Diagnosis not present

## 2022-02-05 DIAGNOSIS — I779 Disorder of arteries and arterioles, unspecified: Secondary | ICD-10-CM | POA: Diagnosis not present

## 2022-02-05 DIAGNOSIS — F5101 Primary insomnia: Secondary | ICD-10-CM | POA: Diagnosis not present

## 2022-02-05 DIAGNOSIS — I480 Paroxysmal atrial fibrillation: Secondary | ICD-10-CM | POA: Diagnosis not present

## 2022-02-05 DIAGNOSIS — D649 Anemia, unspecified: Secondary | ICD-10-CM | POA: Diagnosis not present

## 2022-02-05 DIAGNOSIS — Z7409 Other reduced mobility: Secondary | ICD-10-CM | POA: Diagnosis not present

## 2022-02-05 DIAGNOSIS — E162 Hypoglycemia, unspecified: Secondary | ICD-10-CM | POA: Diagnosis not present

## 2022-02-05 DIAGNOSIS — Z741 Need for assistance with personal care: Secondary | ICD-10-CM | POA: Diagnosis not present

## 2022-02-05 DIAGNOSIS — Z7901 Long term (current) use of anticoagulants: Secondary | ICD-10-CM | POA: Diagnosis not present

## 2022-02-05 DIAGNOSIS — I5032 Chronic diastolic (congestive) heart failure: Secondary | ICD-10-CM | POA: Diagnosis not present

## 2022-02-05 DIAGNOSIS — Z4781 Encounter for orthopedic aftercare following surgical amputation: Secondary | ICD-10-CM | POA: Diagnosis not present

## 2022-02-05 DIAGNOSIS — I739 Peripheral vascular disease, unspecified: Secondary | ICD-10-CM | POA: Diagnosis not present

## 2022-02-05 DIAGNOSIS — F062 Psychotic disorder with delusions due to known physiological condition: Secondary | ICD-10-CM | POA: Diagnosis not present

## 2022-02-05 DIAGNOSIS — R569 Unspecified convulsions: Secondary | ICD-10-CM | POA: Diagnosis not present

## 2022-02-05 DIAGNOSIS — K7291 Hepatic failure, unspecified with coma: Secondary | ICD-10-CM | POA: Diagnosis not present

## 2022-02-05 DIAGNOSIS — I10 Essential (primary) hypertension: Secondary | ICD-10-CM | POA: Diagnosis not present

## 2022-02-05 DIAGNOSIS — G301 Alzheimer's disease with late onset: Secondary | ICD-10-CM | POA: Diagnosis not present

## 2022-02-05 DIAGNOSIS — E119 Type 2 diabetes mellitus without complications: Secondary | ICD-10-CM | POA: Diagnosis not present

## 2022-02-05 DIAGNOSIS — W010XXA Fall on same level from slipping, tripping and stumbling without subsequent striking against object, initial encounter: Secondary | ICD-10-CM | POA: Diagnosis not present

## 2022-02-05 DIAGNOSIS — R2681 Unsteadiness on feet: Secondary | ICD-10-CM | POA: Diagnosis not present

## 2022-02-05 DIAGNOSIS — R293 Abnormal posture: Secondary | ICD-10-CM | POA: Diagnosis not present

## 2022-02-05 DIAGNOSIS — Z794 Long term (current) use of insulin: Secondary | ICD-10-CM | POA: Diagnosis not present

## 2022-02-05 DIAGNOSIS — F39 Unspecified mood [affective] disorder: Secondary | ICD-10-CM | POA: Diagnosis not present

## 2022-02-05 DIAGNOSIS — I501 Left ventricular failure: Secondary | ICD-10-CM | POA: Diagnosis not present

## 2022-02-07 DIAGNOSIS — I10 Essential (primary) hypertension: Secondary | ICD-10-CM | POA: Diagnosis not present

## 2022-02-07 DIAGNOSIS — E119 Type 2 diabetes mellitus without complications: Secondary | ICD-10-CM | POA: Diagnosis not present

## 2022-02-07 DIAGNOSIS — I480 Paroxysmal atrial fibrillation: Secondary | ICD-10-CM | POA: Diagnosis not present

## 2022-02-07 DIAGNOSIS — Z794 Long term (current) use of insulin: Secondary | ICD-10-CM | POA: Diagnosis not present

## 2022-02-09 DIAGNOSIS — I739 Peripheral vascular disease, unspecified: Secondary | ICD-10-CM | POA: Diagnosis not present

## 2022-02-09 DIAGNOSIS — Z794 Long term (current) use of insulin: Secondary | ICD-10-CM | POA: Diagnosis not present

## 2022-02-09 DIAGNOSIS — E119 Type 2 diabetes mellitus without complications: Secondary | ICD-10-CM | POA: Diagnosis not present

## 2022-02-09 DIAGNOSIS — I10 Essential (primary) hypertension: Secondary | ICD-10-CM | POA: Diagnosis not present

## 2022-02-11 DIAGNOSIS — E162 Hypoglycemia, unspecified: Secondary | ICD-10-CM | POA: Diagnosis not present

## 2022-02-14 DIAGNOSIS — I480 Paroxysmal atrial fibrillation: Secondary | ICD-10-CM | POA: Diagnosis not present

## 2022-02-14 DIAGNOSIS — E119 Type 2 diabetes mellitus without complications: Secondary | ICD-10-CM | POA: Diagnosis not present

## 2022-02-14 DIAGNOSIS — I10 Essential (primary) hypertension: Secondary | ICD-10-CM | POA: Diagnosis not present

## 2022-02-14 DIAGNOSIS — F331 Major depressive disorder, recurrent, moderate: Secondary | ICD-10-CM | POA: Diagnosis not present

## 2022-02-15 ENCOUNTER — Other Ambulatory Visit: Payer: Self-pay | Admitting: *Deleted

## 2022-02-15 DIAGNOSIS — I739 Peripheral vascular disease, unspecified: Secondary | ICD-10-CM

## 2022-02-17 DIAGNOSIS — E119 Type 2 diabetes mellitus without complications: Secondary | ICD-10-CM | POA: Diagnosis not present

## 2022-02-17 DIAGNOSIS — R569 Unspecified convulsions: Secondary | ICD-10-CM | POA: Diagnosis not present

## 2022-02-17 DIAGNOSIS — K7291 Hepatic failure, unspecified with coma: Secondary | ICD-10-CM | POA: Diagnosis not present

## 2022-02-17 DIAGNOSIS — R2681 Unsteadiness on feet: Secondary | ICD-10-CM | POA: Diagnosis not present

## 2022-02-17 DIAGNOSIS — Z794 Long term (current) use of insulin: Secondary | ICD-10-CM | POA: Diagnosis not present

## 2022-02-17 DIAGNOSIS — R296 Repeated falls: Secondary | ICD-10-CM | POA: Diagnosis not present

## 2022-02-18 DIAGNOSIS — K7291 Hepatic failure, unspecified with coma: Secondary | ICD-10-CM | POA: Diagnosis not present

## 2022-02-18 DIAGNOSIS — R2681 Unsteadiness on feet: Secondary | ICD-10-CM | POA: Diagnosis not present

## 2022-02-18 DIAGNOSIS — R569 Unspecified convulsions: Secondary | ICD-10-CM | POA: Diagnosis not present

## 2022-02-18 DIAGNOSIS — Z794 Long term (current) use of insulin: Secondary | ICD-10-CM | POA: Diagnosis not present

## 2022-02-18 DIAGNOSIS — R296 Repeated falls: Secondary | ICD-10-CM | POA: Diagnosis not present

## 2022-02-18 DIAGNOSIS — E119 Type 2 diabetes mellitus without complications: Secondary | ICD-10-CM | POA: Diagnosis not present

## 2022-02-23 ENCOUNTER — Other Ambulatory Visit (HOSPITAL_COMMUNITY): Payer: Medicare HMO

## 2022-02-23 ENCOUNTER — Ambulatory Visit: Payer: Medicare HMO | Admitting: Vascular Surgery

## 2022-02-23 ENCOUNTER — Encounter (HOSPITAL_COMMUNITY): Payer: Medicare HMO

## 2022-02-23 DIAGNOSIS — G301 Alzheimer's disease with late onset: Secondary | ICD-10-CM | POA: Diagnosis not present

## 2022-02-23 DIAGNOSIS — F39 Unspecified mood [affective] disorder: Secondary | ICD-10-CM | POA: Diagnosis not present

## 2022-02-23 DIAGNOSIS — F331 Major depressive disorder, recurrent, moderate: Secondary | ICD-10-CM | POA: Diagnosis not present

## 2022-02-23 DIAGNOSIS — F5101 Primary insomnia: Secondary | ICD-10-CM | POA: Diagnosis not present

## 2022-02-23 NOTE — Progress Notes (Signed)
Cardiology Office Note:    Date:  02/24/2022   ID:  Dennis [name], DOB [birth year], MRN [id number]  PCP:  Glenda Chroman, MD   [facility] Cardiologist:  Carlyle Dolly, MD     Referring MD: Glenda Chroman, MD   Follow up for abnormal ECG  History of Present Illness:    Dennis [name] is a 76 y.o. 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   He has a history of CAD with PCI/DESx mRCA, and PDA x1 in 2019. Had been on triple therapy, found to be anemic during 12/2017 admission and ASA was stopped (has had some issues with hematuria around that time as well) and continued on plavix and eliquis. Nuclear stress test in 05/2019 did not show ischemia and overall low risk. NSVT noted and felt to be possibly related to prior scar.   He also has a history of multiple vascular operations on his right leg including skin grafting. He most recently developed worsening ulceration of the right first toe. He ultimately required admission to a hospital in [facility] for right BKA. Hospital course c/b delirium and an abnormal ECG. Echo during admission showed EF 50-55% with no acute abnormalities. He was discharged to a nursing facility in [place].  Today the patient presents to clinic for follow up. He is here with an associated from the rehab. No CP or SOB. No LE edema, orthopnea or PND. No dizziness or syncope. No blood in stool or urine. No palpitations.  Not sure how long he will be staying at the facility.  He lives alone.    Past Medical History:  Diagnosis Date   Arthritis    CAD (coronary artery disease)    a. s/p prior stenting of RCA and OM2 in 2006 b. s/p STEMI in 10/2017 with stenting of mid-RCA and PDA with DES x2   Chronic pain    Diabetes mellitus ([place])    Hyperlipidemia     Hypertension    STEMI (ST elevation myocardial infarction) ([place])    Tobacco use     Past Surgical History:  Procedure Laterality Date   ABDOMINAL AORTOGRAM W/LOWER EXTREMITY Bilateral 06/07/2019   Procedure: ABDOMINAL AORTOGRAM W/LOWER EXTREMITY;  Surgeon: Elam Dutch, MD;  Location: [facility];  Service: Cardiovascular;  Laterality: Bilateral;   CORONARY STENT INTERVENTION N/A 11/13/2017   Procedure: CORONARY STENT INTERVENTION;  Surgeon: Martinique, Peter M, MD;  Location: [place] [facility];  Service: Cardiovascular;  Laterality: N/A;   CORONARY/GRAFT ACUTE MI REVASCULARIZATION N/A 11/13/2017   Procedure: Coronary/Graft Acute MI Revascularization;  Surgeon: Martinique, Peter M, MD;  Location: [place] [facility];  Service: Cardiovascular;  Laterality: N/A;   FEMORAL-TIBIAL BYPASS GRAFT Right 07/01/2019   Procedure: Right  FEMORAL- POSTERIOR TIBIAL ARTERY BYPASS GRAFT with Composite PTFE and Reversed Saphenous Vein, RIGHT Common FEMORAL ENDARTERECTOMY with Profundoplasty;  Surgeon: Elam Dutch, MD;  Location: [facility];  Service: Vascular;  Laterality: Right;   LEFT HEART CATH AND CORONARY ANGIOGRAPHY N/A 11/13/2017   Procedure: LEFT HEART CATH AND CORONARY ANGIOGRAPHY;  Surgeon: Martinique, Peter M, MD;  Location: [facility];  Service: Cardiovascular;  Laterality: N/A;   TOTAL HIP ARTHROPLASTY Right 12/18/2017   Procedure: TOTAL HIP ARTHROPLASTY ANTERIOR APPROACH;  Surgeon: Rod Can, MD;  Location: [facility];  Service: Orthopedics;  Laterality: Right;    Current Medications: Current Meds  Medication Sig   acetaminophen (TYLENOL) 500 MG tablet Take 1,000 mg by mouth every 6 (six) hours as needed for mild pain or headache.   aspirin 81 MG chewable tablet Chew 1 tablet by mouth daily.   digoxin (LANOXIN) 0.125 MG tablet Take 1 tablet by mouth daily.   folic acid (FOLVITE) 1 MG tablet Take 1 tablet by mouth daily.   glipiZIDE (GLUCOTROL) 5 MG tablet Take 1 tablet by mouth daily.    Insulin Glargine Solostar (LANTUS) 100 UNIT/ML Solostar Pen Inject 8 Units into the skin at bedtime.   lisinopril (ZESTRIL) 2.5 MG tablet Take 2.5 mg by mouth daily.   metoprolol tartrate (LOPRESSOR) 25 MG tablet Take 1 tablet (25 mg total) by mouth 2 (two) times daily.   nicotine (NICODERM CQ - DOSED IN MG/24 HOURS) 21 mg/24hr patch Place 21 mg onto the skin daily.   QUEtiapine (SEROQUEL) 100 MG tablet Take 1 tablet by mouth daily.   rosuvastatin (CRESTOR) 5 MG tablet Take 5 mg by mouth daily.   sertraline (ZOLOFT) 25 MG tablet Take 3 tablets by mouth daily.   silver sulfADIAZINE (SILVADENE) 1 % cream Apply 1 application topically daily.   valproic acid (DEPAKENE) 250 MG/5ML solution Take 5 mLs by mouth in the morning, at noon, and at bedtime.   vitamin B-12 1000 MCG tablet Take 1 tablet (1,000 mcg total) by mouth daily.   warfarin (COUMADIN) 5 MG tablet Take 2.5 mg by mouth at bedtime.     Allergies:   Betadine [povidone iodine], Fish allergy, Iodine, Shellfish allergy, Sulfamethoxazole-trimethoprim, Gabapentin, Chlorhexidine, Trimethoprim, and Tramadol   Social History   Socioeconomic History   Marital status: Married    Spouse name: Not on file   Number of children: Not on file   Years of education: Not on file   Highest education level: Not on file  Occupational History   Not on file  Tobacco Use   Smoking status: Former    Types: Cigarettes    Start date: 10/25/2017    Quit date: 11/15/2017    Years since quitting: 4.2   Smokeless tobacco: Never   Tobacco comments:    2-3 cigarettes per year.  Vaping Use   Vaping Use: Never used  Substance and Sexual Activity   Alcohol use: No   Drug use: No   Sexual activity: Not on file  Other Topics Concern   Not on file  Social History Narrative   Not on file   Social Determinants of Health   Financial Resource Strain: Not on file  Food Insecurity: Not on file  Transportation Needs: Not on file  Physical Activity: Not on file   Stress: Not on file  Social Connections: Not on file     Family History: The patient's family history includes Heart disease in his father; Hypertension in his father.  ROS:   Please see the history of present illness.     All other systems reviewed and are negative.  EKGs/Labs/Other Studies Reviewed:    The following studies were reviewed today:  Echo 01/26/22 Summary   1. Overall left ventricular ejection fraction is estimated at 50 to 55%.   2. Low normal global left ventricular systolic function.   3. There is no evidence of pericardial effusion.   Left Ventricle:  Definity ultrasound enhancing agent was given IV to delineate the left ventricular endocardial borders. Normal left ventricular  size and wall thicknesses, with normal systolic and diastolic function. Overall left ventricular ejection fraction is estimated at 50 to 55%. Global LV systolic function was low normal. Tissue Doppler indicates an equivocal left ventricular filling pressure.   LV Wall Scoring:  Not all segments were able to be clearly seen or scored.    Right Ventricle:  The right ventricular size is normal. Global RV systolic function is normal.   Left Atrium:  The left atrium is normal in size.   Right Atrium:  The right atrium is normal in size. The inferior vena cava measures 1.80 cm.   Pericardium:  There is no evidence of pericardial effusion.   Mitral Valve:  Structurally normal mitral valve, with normal leaflet excursion; without any evidence of mitral stenosis. The mitral valve is normal in structure. Trace mitral valve regurgitation is seen.   Tricuspid Valve:  The tricuspid valve is normal in structure. Trace tricuspid regurgitation is visualized.   Aortic Valve:  The aortic valve is not well visualized, but appears to open normally. No degree of aortic stenosis is present. The peak pressure gradient across the aortic valve is 4.2 mmHg, and the mean pressure gradient is 2.0 mmHg. The  calculated AVA is 2.76 cm. No evidence of aortic valve regurgitation is seen.   Pulmonic Valve:  The pulmonic valve was not well visualized. No indication of pulmonic valve regurgitation.   Aorta:  The aortic root and ascending aorta appear structurally normal, with no evidence of dilatation.   Pulmonary Artery:  The main pulmonary artery appears normal in size, origin and position.   Venous:  Visualized portions of the inferior vena cava appear normal. The visualized portion of the inferior vena cava size is < 2.1cm in diameter, with respiratory variation greater than 50%.   Shunts:  No evidence of intracardiac shunt.   2D AND M-MODE MEASUREMENTS (normal ranges within parentheses)   Aorta/Left Atrium:  Aortic Root-2D (2.4-3.7cm): 3.4 cm  LVOT:                       2.1 cm  TAPSE 1.6    LV DIASTOLIC FUNCTION:  MV Peak E:  0.74 m/s  MV Peak A:  0.70 m/s  E/A Ratio:  1.06  LV IVRT:    70 msec  septal e':  0.06 m/s  E/e' Ratio: 11.83   Mitral Valve:  MV Max Vel:   0.79 m/s  MV Mean Grad: 2.0 mmHg  MV P1/2 Time: 68.73 msec  MV Area, PHT: 3.20 cm   Aortic Valve: AoV Max Vel: 1.03 m/s  AoV Peak PG: 4.2 mmHg  AoV Mean PG: 2.0 mmHg   LVOT Vmax:     0.93 m/s  LVOT VTI:      0.145 m  LVOT Diameter: 2.10 cm   AoV Area, Vmax:       3.13 cm  AoV Area, VTI:        2.76 cm  AoV Area, Vmn:        3.40 cm  AoV Area Index (VTI): 1.36   Tricuspid Valve and PA/RV Systolic Pressure: TR Max Velocity:  RA Pressure:     5 mmHg  RVSP/PASP:   Pulmonic Valve:  PV Max Velocity: 0.88 m/s  PV Max PG:       3.1 mmHg  PV Mean PG:   EKG:  EKG is  ordered today.  The ekg ordered today demonstrates sinus with first-degree AV block and heart rate of  58  Recent Labs: No results found for requested labs within last 365 days.  Recent Lipid Panel    Component Value Date/Time   CHOL 117 11/14/2017 0500   TRIG 72 11/14/2017 0500   HDL 33 (L) 11/14/2017 0500   CHOLHDL 3.5  11/14/2017 0500   VLDL 14 11/14/2017 0500   LDLCALC 70 11/14/2017 0500     Risk Assessment/Calculations:    CHA2DS2-VASc Score = 4   This indicates a 4.8% annual risk of stroke. The patient's score is based upon: CHF History: 1 HTN History: 1 Diabetes History: 0 Stroke History: 0 Vascular Disease History: 1 Age Score: 1 Gender Score: 0          Physical Exam:    VS:  BP 114/60   Pulse 62   Ht '6\' 1"'$  (1.854 m)   Wt 163 lb 12.8 oz (74.3 kg) Comment: taken from facility, pt not able to stand amputee right leg  SpO2 99%   BMI 21.61 kg/m     Wt Readings from Last 3 Encounters:  02/24/22 163 lb 12.8 oz (74.3 kg)  01/25/20 220 lb (99.8 kg)  12/24/19 205 lb (93 kg)     GEN:  Well nourished, well developed in no acute distress, seems blunted HEENT: Normal NECK: No JVD; No carotid bruits LYMPHATICS: No lymphadenopathy CARDIAC: RRR, no murmurs, rubs, gallops RESPIRATORY:  Clear to auscultation without rales, wheezing or rhonchi  ABDOMEN: Soft, non-tender, non-distended MUSCULOSKELETAL: Status post right BKA. SKIN: Warm and dry NEUROLOGIC:  Alert and oriented x 3 PSYCHIATRIC:  Normal affect   ASSESSMENT:    1. CAD S/P percutaneous coronary angioplasty   2. PAF (paroxysmal atrial fibrillation) ([place])   3. Chronic diastolic CHF (congestive heart failure) ([place])   4. Bilateral carotid artery disease, unspecified type ([place])   5. PAD (peripheral artery disease) ([facility])    PLAN:    In order of problems listed above:  CAD: multiple stents in the past, last stent 10/2017 to RCA and PDA. 10/29/21 echo LVEF 50-55%.  No chest pain.  Per review of med list he is no longer on Plavix as he is on Coumadin.  Continue beta-blocker and statin.  In the appointment notes today there was a mention of reviewing an abnormal EKG.  This was never sent.  EKG was done today which showed no ischemic changes.  He has sinus rhythm with a first-degree AV block.   PAF: sounds regular on exam.  Continue  Coumadin as well as Lopressor 25 mg twice daily and digoxin 0.125 mg daily.   Chronic diastolic CHF: appears euvolemic off diuretics.   Carotid stenosis: 01/2018 carotid US RICA 2-69%, LICA 48-54%. Continue follow up with vascular  PAD: s/p recent right BKA.  Working with rehab and hopeful to get a prosthesis in the near future.   Medication Adjustments/Labs and Tests Ordered: Current medicines are reviewed at length with the patient today.  Concerns regarding medicines are outlined above.  Orders Placed This Encounter  Procedures   EKG 12-Lead   No orders of the defined types were placed in this encounter.   Patient Instructions  Medication Instructions:  Your physician recommends that you continue on your current medications as directed. Please refer to the Current Medication list given to you today.  *If you need a refill on your cardiac medications before your next appointment, please call your pharmacy*   Lab Work: None ordered   If you have labs (blood work) drawn today and your tests are  completely normal, you will receive your results only by: MyChart Message (if you have MyChart) OR A paper copy in the mail If you have any lab test that is abnormal or we need to change your treatment, we will call you to review the results.   Testing/Procedures: None ordered    Follow-Up: At [facility], you and your health needs are our priority.  As part of our continuing mission to provide you with exceptional heart care, we have created designated Provider Care Teams.  These Care Teams include your primary Cardiologist (physician) and Advanced Practice Providers (APPs -  Physician Assistants and Nurse Practitioners) who all work together to provide you with the care you need, when you need it.  We recommend signing up for the patient portal called "MyChart".  Sign up information is provided on this After Visit Summary.  MyChart is used to connect with patients for Virtual  Visits (Telemedicine).  Patients are able to view lab/test results, encounter notes, upcoming appointments, etc.  Non-urgent messages can be sent to your provider as well.   To learn more about what you can do with MyChart, go to [email or web address].    Your next appointment:   6 month(s)  The format for your next appointment:   In Person  Provider:   Dr. Harl Bowie in the [place] or the [place] office  1}    Other Instructions Important Information About Sugar         Signed, Angelena Form, PA-C  02/24/2022 10:46 AM    [place]

## 2022-02-24 ENCOUNTER — Ambulatory Visit (INDEPENDENT_AMBULATORY_CARE_PROVIDER_SITE_OTHER): Payer: Medicare HMO | Admitting: Physician Assistant

## 2022-02-24 ENCOUNTER — Encounter: Payer: Self-pay | Admitting: Physician Assistant

## 2022-02-24 VITALS — BP 114/60 | HR 62 | Ht 73.0 in | Wt 163.8 lb

## 2022-02-24 DIAGNOSIS — I739 Peripheral vascular disease, unspecified: Secondary | ICD-10-CM

## 2022-02-24 DIAGNOSIS — I779 Disorder of arteries and arterioles, unspecified: Secondary | ICD-10-CM

## 2022-02-24 DIAGNOSIS — I2510 Atherosclerotic heart disease of native coronary artery without angina pectoris: Secondary | ICD-10-CM | POA: Diagnosis not present

## 2022-02-24 DIAGNOSIS — I480 Paroxysmal atrial fibrillation: Secondary | ICD-10-CM

## 2022-02-24 DIAGNOSIS — I5032 Chronic diastolic (congestive) heart failure: Secondary | ICD-10-CM | POA: Diagnosis not present

## 2022-02-24 DIAGNOSIS — Z9861 Coronary angioplasty status: Secondary | ICD-10-CM

## 2022-02-24 NOTE — Patient Instructions (Signed)
Medication Instructions:  Your physician recommends that you continue on your current medications as directed. Please refer to the Current Medication list given to you today.  *If you need a refill on your cardiac medications before your next appointment, please call your pharmacy*   Lab Work: None ordered   If you have labs (blood work) drawn today and your tests are completely normal, you will receive your results only by: [place] (if you have MyChart) OR A paper copy in the mail If you have any lab test that is abnormal or we need to change your treatment, we will call you to review the results.   Testing/Procedures: None ordered    Follow-Up: At [facility], you and your health needs are our priority.  As part of our continuing mission to provide you with exceptional heart care, we have created designated Provider Care Teams.  These Care Teams include your primary Cardiologist (physician) and Advanced Practice Providers (APPs -  Physician Assistants and Nurse Practitioners) who all work together to provide you with the care you need, when you need it.  We recommend signing up for the patient portal called "MyChart".  Sign up information is provided on this After Visit Summary.  MyChart is used to connect with patients for Virtual Visits (Telemedicine).  Patients are able to view lab/test results, encounter notes, upcoming appointments, etc.  Non-urgent messages can be sent to your provider as well.   To learn more about what you can do with MyChart, go to [email or web address].    Your next appointment:   6 month(s)  The format for your next appointment:   In Person  Provider:   Dr. Harl Bowie in the [place] or the [place] office  1}    Other Instructions Important Information About Sugar

## 2022-02-25 DIAGNOSIS — E119 Type 2 diabetes mellitus without complications: Secondary | ICD-10-CM | POA: Diagnosis not present

## 2022-02-25 DIAGNOSIS — W010XXA Fall on same level from slipping, tripping and stumbling without subsequent striking against object, initial encounter: Secondary | ICD-10-CM | POA: Diagnosis not present

## 2022-02-25 DIAGNOSIS — R296 Repeated falls: Secondary | ICD-10-CM | POA: Diagnosis not present

## 2022-02-25 DIAGNOSIS — I10 Essential (primary) hypertension: Secondary | ICD-10-CM | POA: Diagnosis not present

## 2022-02-25 DIAGNOSIS — I480 Paroxysmal atrial fibrillation: Secondary | ICD-10-CM | POA: Diagnosis not present

## 2022-02-25 DIAGNOSIS — I739 Peripheral vascular disease, unspecified: Secondary | ICD-10-CM | POA: Diagnosis not present

## 2022-02-26 DIAGNOSIS — F32A Depression, unspecified: Secondary | ICD-10-CM | POA: Diagnosis not present

## 2022-02-26 DIAGNOSIS — I509 Heart failure, unspecified: Secondary | ICD-10-CM | POA: Diagnosis not present

## 2022-02-26 DIAGNOSIS — I2510 Atherosclerotic heart disease of native coronary artery without angina pectoris: Secondary | ICD-10-CM | POA: Diagnosis not present

## 2022-02-26 DIAGNOSIS — Z89611 Acquired absence of right leg above knee: Secondary | ICD-10-CM | POA: Diagnosis not present

## 2022-02-26 DIAGNOSIS — I4891 Unspecified atrial fibrillation: Secondary | ICD-10-CM | POA: Diagnosis not present

## 2022-02-26 DIAGNOSIS — I739 Peripheral vascular disease, unspecified: Secondary | ICD-10-CM | POA: Diagnosis not present

## 2022-02-26 DIAGNOSIS — F419 Anxiety disorder, unspecified: Secondary | ICD-10-CM | POA: Diagnosis not present

## 2022-02-26 DIAGNOSIS — E1143 Type 2 diabetes mellitus with diabetic autonomic (poly)neuropathy: Secondary | ICD-10-CM | POA: Diagnosis not present

## 2022-02-26 DIAGNOSIS — Z4781 Encounter for orthopedic aftercare following surgical amputation: Secondary | ICD-10-CM | POA: Diagnosis not present

## 2022-02-28 DIAGNOSIS — I2510 Atherosclerotic heart disease of native coronary artery without angina pectoris: Secondary | ICD-10-CM | POA: Diagnosis not present

## 2022-02-28 DIAGNOSIS — I739 Peripheral vascular disease, unspecified: Secondary | ICD-10-CM | POA: Diagnosis not present

## 2022-02-28 DIAGNOSIS — Z89611 Acquired absence of right leg above knee: Secondary | ICD-10-CM | POA: Diagnosis not present

## 2022-02-28 DIAGNOSIS — I4891 Unspecified atrial fibrillation: Secondary | ICD-10-CM | POA: Diagnosis not present

## 2022-02-28 DIAGNOSIS — I509 Heart failure, unspecified: Secondary | ICD-10-CM | POA: Diagnosis not present

## 2022-02-28 DIAGNOSIS — F32A Depression, unspecified: Secondary | ICD-10-CM | POA: Diagnosis not present

## 2022-02-28 DIAGNOSIS — Z4781 Encounter for orthopedic aftercare following surgical amputation: Secondary | ICD-10-CM | POA: Diagnosis not present

## 2022-02-28 DIAGNOSIS — E1143 Type 2 diabetes mellitus with diabetic autonomic (poly)neuropathy: Secondary | ICD-10-CM | POA: Diagnosis not present

## 2022-02-28 DIAGNOSIS — F419 Anxiety disorder, unspecified: Secondary | ICD-10-CM | POA: Diagnosis not present

## 2022-03-01 DIAGNOSIS — F32A Depression, unspecified: Secondary | ICD-10-CM | POA: Diagnosis not present

## 2022-03-01 DIAGNOSIS — F419 Anxiety disorder, unspecified: Secondary | ICD-10-CM | POA: Diagnosis not present

## 2022-03-01 DIAGNOSIS — I739 Peripheral vascular disease, unspecified: Secondary | ICD-10-CM | POA: Diagnosis not present

## 2022-03-01 DIAGNOSIS — I509 Heart failure, unspecified: Secondary | ICD-10-CM | POA: Diagnosis not present

## 2022-03-01 DIAGNOSIS — E1143 Type 2 diabetes mellitus with diabetic autonomic (poly)neuropathy: Secondary | ICD-10-CM | POA: Diagnosis not present

## 2022-03-01 DIAGNOSIS — Z4781 Encounter for orthopedic aftercare following surgical amputation: Secondary | ICD-10-CM | POA: Diagnosis not present

## 2022-03-01 DIAGNOSIS — Z89611 Acquired absence of right leg above knee: Secondary | ICD-10-CM | POA: Diagnosis not present

## 2022-03-01 DIAGNOSIS — I2510 Atherosclerotic heart disease of native coronary artery without angina pectoris: Secondary | ICD-10-CM | POA: Diagnosis not present

## 2022-03-01 DIAGNOSIS — I4891 Unspecified atrial fibrillation: Secondary | ICD-10-CM | POA: Diagnosis not present

## 2022-03-02 DIAGNOSIS — F419 Anxiety disorder, unspecified: Secondary | ICD-10-CM | POA: Diagnosis not present

## 2022-03-02 DIAGNOSIS — I4891 Unspecified atrial fibrillation: Secondary | ICD-10-CM | POA: Diagnosis not present

## 2022-03-02 DIAGNOSIS — I739 Peripheral vascular disease, unspecified: Secondary | ICD-10-CM | POA: Diagnosis not present

## 2022-03-02 DIAGNOSIS — F32A Depression, unspecified: Secondary | ICD-10-CM | POA: Diagnosis not present

## 2022-03-02 DIAGNOSIS — I509 Heart failure, unspecified: Secondary | ICD-10-CM | POA: Diagnosis not present

## 2022-03-02 DIAGNOSIS — Z4781 Encounter for orthopedic aftercare following surgical amputation: Secondary | ICD-10-CM | POA: Diagnosis not present

## 2022-03-02 DIAGNOSIS — I2510 Atherosclerotic heart disease of native coronary artery without angina pectoris: Secondary | ICD-10-CM | POA: Diagnosis not present

## 2022-03-02 DIAGNOSIS — E1143 Type 2 diabetes mellitus with diabetic autonomic (poly)neuropathy: Secondary | ICD-10-CM | POA: Diagnosis not present

## 2022-03-02 DIAGNOSIS — Z89611 Acquired absence of right leg above knee: Secondary | ICD-10-CM | POA: Diagnosis not present

## 2022-03-03 DIAGNOSIS — I2510 Atherosclerotic heart disease of native coronary artery without angina pectoris: Secondary | ICD-10-CM | POA: Diagnosis not present

## 2022-03-03 DIAGNOSIS — E1143 Type 2 diabetes mellitus with diabetic autonomic (poly)neuropathy: Secondary | ICD-10-CM | POA: Diagnosis not present

## 2022-03-03 DIAGNOSIS — F32A Depression, unspecified: Secondary | ICD-10-CM | POA: Diagnosis not present

## 2022-03-03 DIAGNOSIS — F419 Anxiety disorder, unspecified: Secondary | ICD-10-CM | POA: Diagnosis not present

## 2022-03-03 DIAGNOSIS — Z4781 Encounter for orthopedic aftercare following surgical amputation: Secondary | ICD-10-CM | POA: Diagnosis not present

## 2022-03-03 DIAGNOSIS — I739 Peripheral vascular disease, unspecified: Secondary | ICD-10-CM | POA: Diagnosis not present

## 2022-03-03 DIAGNOSIS — I4891 Unspecified atrial fibrillation: Secondary | ICD-10-CM | POA: Diagnosis not present

## 2022-03-03 DIAGNOSIS — Z89611 Acquired absence of right leg above knee: Secondary | ICD-10-CM | POA: Diagnosis not present

## 2022-03-03 DIAGNOSIS — I509 Heart failure, unspecified: Secondary | ICD-10-CM | POA: Diagnosis not present

## 2022-03-04 DIAGNOSIS — Z89611 Acquired absence of right leg above knee: Secondary | ICD-10-CM | POA: Diagnosis not present

## 2022-03-04 DIAGNOSIS — I2510 Atherosclerotic heart disease of native coronary artery without angina pectoris: Secondary | ICD-10-CM | POA: Diagnosis not present

## 2022-03-04 DIAGNOSIS — I509 Heart failure, unspecified: Secondary | ICD-10-CM | POA: Diagnosis not present

## 2022-03-04 DIAGNOSIS — E1143 Type 2 diabetes mellitus with diabetic autonomic (poly)neuropathy: Secondary | ICD-10-CM | POA: Diagnosis not present

## 2022-03-04 DIAGNOSIS — F32A Depression, unspecified: Secondary | ICD-10-CM | POA: Diagnosis not present

## 2022-03-04 DIAGNOSIS — I739 Peripheral vascular disease, unspecified: Secondary | ICD-10-CM | POA: Diagnosis not present

## 2022-03-04 DIAGNOSIS — F419 Anxiety disorder, unspecified: Secondary | ICD-10-CM | POA: Diagnosis not present

## 2022-03-04 DIAGNOSIS — I4891 Unspecified atrial fibrillation: Secondary | ICD-10-CM | POA: Diagnosis not present

## 2022-03-04 DIAGNOSIS — Z4781 Encounter for orthopedic aftercare following surgical amputation: Secondary | ICD-10-CM | POA: Diagnosis not present

## 2022-03-07 DIAGNOSIS — Z4781 Encounter for orthopedic aftercare following surgical amputation: Secondary | ICD-10-CM | POA: Diagnosis not present

## 2022-03-07 DIAGNOSIS — I739 Peripheral vascular disease, unspecified: Secondary | ICD-10-CM | POA: Diagnosis not present

## 2022-03-07 DIAGNOSIS — I509 Heart failure, unspecified: Secondary | ICD-10-CM | POA: Diagnosis not present

## 2022-03-07 DIAGNOSIS — Z89611 Acquired absence of right leg above knee: Secondary | ICD-10-CM | POA: Diagnosis not present

## 2022-03-07 DIAGNOSIS — I4891 Unspecified atrial fibrillation: Secondary | ICD-10-CM | POA: Diagnosis not present

## 2022-03-07 DIAGNOSIS — F419 Anxiety disorder, unspecified: Secondary | ICD-10-CM | POA: Diagnosis not present

## 2022-03-07 DIAGNOSIS — F32A Depression, unspecified: Secondary | ICD-10-CM | POA: Diagnosis not present

## 2022-03-07 DIAGNOSIS — E1143 Type 2 diabetes mellitus with diabetic autonomic (poly)neuropathy: Secondary | ICD-10-CM | POA: Diagnosis not present

## 2022-03-07 DIAGNOSIS — I2510 Atherosclerotic heart disease of native coronary artery without angina pectoris: Secondary | ICD-10-CM | POA: Diagnosis not present

## 2022-03-08 DIAGNOSIS — I4891 Unspecified atrial fibrillation: Secondary | ICD-10-CM | POA: Diagnosis not present

## 2022-03-08 DIAGNOSIS — Z299 Encounter for prophylactic measures, unspecified: Secondary | ICD-10-CM | POA: Diagnosis not present

## 2022-03-08 DIAGNOSIS — I10 Essential (primary) hypertension: Secondary | ICD-10-CM | POA: Diagnosis not present

## 2022-03-08 DIAGNOSIS — E1165 Type 2 diabetes mellitus with hyperglycemia: Secondary | ICD-10-CM | POA: Diagnosis not present

## 2022-03-08 DIAGNOSIS — Z89611 Acquired absence of right leg above knee: Secondary | ICD-10-CM | POA: Diagnosis not present

## 2022-03-09 DIAGNOSIS — I509 Heart failure, unspecified: Secondary | ICD-10-CM | POA: Diagnosis not present

## 2022-03-09 DIAGNOSIS — E1143 Type 2 diabetes mellitus with diabetic autonomic (poly)neuropathy: Secondary | ICD-10-CM | POA: Diagnosis not present

## 2022-03-09 DIAGNOSIS — I2510 Atherosclerotic heart disease of native coronary artery without angina pectoris: Secondary | ICD-10-CM | POA: Diagnosis not present

## 2022-03-09 DIAGNOSIS — I739 Peripheral vascular disease, unspecified: Secondary | ICD-10-CM | POA: Diagnosis not present

## 2022-03-09 DIAGNOSIS — F419 Anxiety disorder, unspecified: Secondary | ICD-10-CM | POA: Diagnosis not present

## 2022-03-09 DIAGNOSIS — Z4781 Encounter for orthopedic aftercare following surgical amputation: Secondary | ICD-10-CM | POA: Diagnosis not present

## 2022-03-09 DIAGNOSIS — Z89611 Acquired absence of right leg above knee: Secondary | ICD-10-CM | POA: Diagnosis not present

## 2022-03-09 DIAGNOSIS — F32A Depression, unspecified: Secondary | ICD-10-CM | POA: Diagnosis not present

## 2022-03-09 DIAGNOSIS — I4891 Unspecified atrial fibrillation: Secondary | ICD-10-CM | POA: Diagnosis not present

## 2022-03-10 DIAGNOSIS — I4891 Unspecified atrial fibrillation: Secondary | ICD-10-CM | POA: Diagnosis not present

## 2022-03-10 DIAGNOSIS — I739 Peripheral vascular disease, unspecified: Secondary | ICD-10-CM | POA: Diagnosis not present

## 2022-03-10 DIAGNOSIS — F32A Depression, unspecified: Secondary | ICD-10-CM | POA: Diagnosis not present

## 2022-03-10 DIAGNOSIS — E1143 Type 2 diabetes mellitus with diabetic autonomic (poly)neuropathy: Secondary | ICD-10-CM | POA: Diagnosis not present

## 2022-03-10 DIAGNOSIS — I2510 Atherosclerotic heart disease of native coronary artery without angina pectoris: Secondary | ICD-10-CM | POA: Diagnosis not present

## 2022-03-10 DIAGNOSIS — Z4781 Encounter for orthopedic aftercare following surgical amputation: Secondary | ICD-10-CM | POA: Diagnosis not present

## 2022-03-10 DIAGNOSIS — I509 Heart failure, unspecified: Secondary | ICD-10-CM | POA: Diagnosis not present

## 2022-03-10 DIAGNOSIS — F419 Anxiety disorder, unspecified: Secondary | ICD-10-CM | POA: Diagnosis not present

## 2022-03-10 DIAGNOSIS — Z89611 Acquired absence of right leg above knee: Secondary | ICD-10-CM | POA: Diagnosis not present

## 2022-03-15 DIAGNOSIS — E1143 Type 2 diabetes mellitus with diabetic autonomic (poly)neuropathy: Secondary | ICD-10-CM | POA: Diagnosis not present

## 2022-03-15 DIAGNOSIS — I509 Heart failure, unspecified: Secondary | ICD-10-CM | POA: Diagnosis not present

## 2022-03-15 DIAGNOSIS — I4891 Unspecified atrial fibrillation: Secondary | ICD-10-CM | POA: Diagnosis not present

## 2022-03-15 DIAGNOSIS — I739 Peripheral vascular disease, unspecified: Secondary | ICD-10-CM | POA: Diagnosis not present

## 2022-03-15 DIAGNOSIS — F32A Depression, unspecified: Secondary | ICD-10-CM | POA: Diagnosis not present

## 2022-03-15 DIAGNOSIS — F419 Anxiety disorder, unspecified: Secondary | ICD-10-CM | POA: Diagnosis not present

## 2022-03-15 DIAGNOSIS — I2510 Atherosclerotic heart disease of native coronary artery without angina pectoris: Secondary | ICD-10-CM | POA: Diagnosis not present

## 2022-03-15 DIAGNOSIS — Z4781 Encounter for orthopedic aftercare following surgical amputation: Secondary | ICD-10-CM | POA: Diagnosis not present

## 2022-03-15 DIAGNOSIS — Z89611 Acquired absence of right leg above knee: Secondary | ICD-10-CM | POA: Diagnosis not present

## 2022-03-17 DIAGNOSIS — F32A Depression, unspecified: Secondary | ICD-10-CM | POA: Diagnosis not present

## 2022-03-17 DIAGNOSIS — Z4781 Encounter for orthopedic aftercare following surgical amputation: Secondary | ICD-10-CM | POA: Diagnosis not present

## 2022-03-17 DIAGNOSIS — I509 Heart failure, unspecified: Secondary | ICD-10-CM | POA: Diagnosis not present

## 2022-03-17 DIAGNOSIS — I2510 Atherosclerotic heart disease of native coronary artery without angina pectoris: Secondary | ICD-10-CM | POA: Diagnosis not present

## 2022-03-17 DIAGNOSIS — I739 Peripheral vascular disease, unspecified: Secondary | ICD-10-CM | POA: Diagnosis not present

## 2022-03-17 DIAGNOSIS — E1143 Type 2 diabetes mellitus with diabetic autonomic (poly)neuropathy: Secondary | ICD-10-CM | POA: Diagnosis not present

## 2022-03-17 DIAGNOSIS — F419 Anxiety disorder, unspecified: Secondary | ICD-10-CM | POA: Diagnosis not present

## 2022-03-17 DIAGNOSIS — I4891 Unspecified atrial fibrillation: Secondary | ICD-10-CM | POA: Diagnosis not present

## 2022-03-17 DIAGNOSIS — Z89611 Acquired absence of right leg above knee: Secondary | ICD-10-CM | POA: Diagnosis not present

## 2022-03-18 DIAGNOSIS — Z4781 Encounter for orthopedic aftercare following surgical amputation: Secondary | ICD-10-CM | POA: Diagnosis not present

## 2022-03-18 DIAGNOSIS — E1143 Type 2 diabetes mellitus with diabetic autonomic (poly)neuropathy: Secondary | ICD-10-CM | POA: Diagnosis not present

## 2022-03-18 DIAGNOSIS — I509 Heart failure, unspecified: Secondary | ICD-10-CM | POA: Diagnosis not present

## 2022-03-18 DIAGNOSIS — F32A Depression, unspecified: Secondary | ICD-10-CM | POA: Diagnosis not present

## 2022-03-18 DIAGNOSIS — I2510 Atherosclerotic heart disease of native coronary artery without angina pectoris: Secondary | ICD-10-CM | POA: Diagnosis not present

## 2022-03-18 DIAGNOSIS — F419 Anxiety disorder, unspecified: Secondary | ICD-10-CM | POA: Diagnosis not present

## 2022-03-18 DIAGNOSIS — Z89611 Acquired absence of right leg above knee: Secondary | ICD-10-CM | POA: Diagnosis not present

## 2022-03-18 DIAGNOSIS — I739 Peripheral vascular disease, unspecified: Secondary | ICD-10-CM | POA: Diagnosis not present

## 2022-03-18 DIAGNOSIS — I4891 Unspecified atrial fibrillation: Secondary | ICD-10-CM | POA: Diagnosis not present

## 2022-03-21 DIAGNOSIS — Z89611 Acquired absence of right leg above knee: Secondary | ICD-10-CM | POA: Diagnosis not present

## 2022-03-21 DIAGNOSIS — F32A Depression, unspecified: Secondary | ICD-10-CM | POA: Diagnosis not present

## 2022-03-21 DIAGNOSIS — E1143 Type 2 diabetes mellitus with diabetic autonomic (poly)neuropathy: Secondary | ICD-10-CM | POA: Diagnosis not present

## 2022-03-21 DIAGNOSIS — F419 Anxiety disorder, unspecified: Secondary | ICD-10-CM | POA: Diagnosis not present

## 2022-03-21 DIAGNOSIS — I509 Heart failure, unspecified: Secondary | ICD-10-CM | POA: Diagnosis not present

## 2022-03-21 DIAGNOSIS — I4891 Unspecified atrial fibrillation: Secondary | ICD-10-CM | POA: Diagnosis not present

## 2022-03-21 DIAGNOSIS — I739 Peripheral vascular disease, unspecified: Secondary | ICD-10-CM | POA: Diagnosis not present

## 2022-03-21 DIAGNOSIS — I2510 Atherosclerotic heart disease of native coronary artery without angina pectoris: Secondary | ICD-10-CM | POA: Diagnosis not present

## 2022-03-21 DIAGNOSIS — Z4781 Encounter for orthopedic aftercare following surgical amputation: Secondary | ICD-10-CM | POA: Diagnosis not present

## 2022-03-22 DIAGNOSIS — Z89611 Acquired absence of right leg above knee: Secondary | ICD-10-CM | POA: Diagnosis not present

## 2022-03-22 DIAGNOSIS — F32A Depression, unspecified: Secondary | ICD-10-CM | POA: Diagnosis not present

## 2022-03-22 DIAGNOSIS — I2510 Atherosclerotic heart disease of native coronary artery without angina pectoris: Secondary | ICD-10-CM | POA: Diagnosis not present

## 2022-03-22 DIAGNOSIS — I739 Peripheral vascular disease, unspecified: Secondary | ICD-10-CM | POA: Diagnosis not present

## 2022-03-22 DIAGNOSIS — I4891 Unspecified atrial fibrillation: Secondary | ICD-10-CM | POA: Diagnosis not present

## 2022-03-22 DIAGNOSIS — E1143 Type 2 diabetes mellitus with diabetic autonomic (poly)neuropathy: Secondary | ICD-10-CM | POA: Diagnosis not present

## 2022-03-22 DIAGNOSIS — Z4781 Encounter for orthopedic aftercare following surgical amputation: Secondary | ICD-10-CM | POA: Diagnosis not present

## 2022-03-22 DIAGNOSIS — I509 Heart failure, unspecified: Secondary | ICD-10-CM | POA: Diagnosis not present

## 2022-03-22 DIAGNOSIS — F419 Anxiety disorder, unspecified: Secondary | ICD-10-CM | POA: Diagnosis not present

## 2022-03-23 DIAGNOSIS — I4891 Unspecified atrial fibrillation: Secondary | ICD-10-CM | POA: Diagnosis not present

## 2022-03-23 DIAGNOSIS — I739 Peripheral vascular disease, unspecified: Secondary | ICD-10-CM | POA: Diagnosis not present

## 2022-03-23 DIAGNOSIS — Z89611 Acquired absence of right leg above knee: Secondary | ICD-10-CM | POA: Diagnosis not present

## 2022-03-23 DIAGNOSIS — I10 Essential (primary) hypertension: Secondary | ICD-10-CM | POA: Diagnosis not present

## 2022-03-23 DIAGNOSIS — Z299 Encounter for prophylactic measures, unspecified: Secondary | ICD-10-CM | POA: Diagnosis not present

## 2022-03-24 DIAGNOSIS — F32A Depression, unspecified: Secondary | ICD-10-CM | POA: Diagnosis not present

## 2022-03-24 DIAGNOSIS — E1143 Type 2 diabetes mellitus with diabetic autonomic (poly)neuropathy: Secondary | ICD-10-CM | POA: Diagnosis not present

## 2022-03-24 DIAGNOSIS — I2510 Atherosclerotic heart disease of native coronary artery without angina pectoris: Secondary | ICD-10-CM | POA: Diagnosis not present

## 2022-03-24 DIAGNOSIS — I739 Peripheral vascular disease, unspecified: Secondary | ICD-10-CM | POA: Diagnosis not present

## 2022-03-24 DIAGNOSIS — Z89611 Acquired absence of right leg above knee: Secondary | ICD-10-CM | POA: Diagnosis not present

## 2022-03-24 DIAGNOSIS — I4891 Unspecified atrial fibrillation: Secondary | ICD-10-CM | POA: Diagnosis not present

## 2022-03-24 DIAGNOSIS — Z4781 Encounter for orthopedic aftercare following surgical amputation: Secondary | ICD-10-CM | POA: Diagnosis not present

## 2022-03-24 DIAGNOSIS — I509 Heart failure, unspecified: Secondary | ICD-10-CM | POA: Diagnosis not present

## 2022-03-24 DIAGNOSIS — F419 Anxiety disorder, unspecified: Secondary | ICD-10-CM | POA: Diagnosis not present

## 2022-03-25 DIAGNOSIS — F419 Anxiety disorder, unspecified: Secondary | ICD-10-CM | POA: Diagnosis not present

## 2022-03-25 DIAGNOSIS — Z4781 Encounter for orthopedic aftercare following surgical amputation: Secondary | ICD-10-CM | POA: Diagnosis not present

## 2022-03-25 DIAGNOSIS — I509 Heart failure, unspecified: Secondary | ICD-10-CM | POA: Diagnosis not present

## 2022-03-25 DIAGNOSIS — I4891 Unspecified atrial fibrillation: Secondary | ICD-10-CM | POA: Diagnosis not present

## 2022-03-25 DIAGNOSIS — F32A Depression, unspecified: Secondary | ICD-10-CM | POA: Diagnosis not present

## 2022-03-25 DIAGNOSIS — I2510 Atherosclerotic heart disease of native coronary artery without angina pectoris: Secondary | ICD-10-CM | POA: Diagnosis not present

## 2022-03-25 DIAGNOSIS — I739 Peripheral vascular disease, unspecified: Secondary | ICD-10-CM | POA: Diagnosis not present

## 2022-03-25 DIAGNOSIS — E1143 Type 2 diabetes mellitus with diabetic autonomic (poly)neuropathy: Secondary | ICD-10-CM | POA: Diagnosis not present

## 2022-03-25 DIAGNOSIS — Z89611 Acquired absence of right leg above knee: Secondary | ICD-10-CM | POA: Diagnosis not present

## 2022-03-27 DIAGNOSIS — I4891 Unspecified atrial fibrillation: Secondary | ICD-10-CM | POA: Diagnosis not present

## 2022-03-27 DIAGNOSIS — I509 Heart failure, unspecified: Secondary | ICD-10-CM | POA: Diagnosis not present

## 2022-03-27 DIAGNOSIS — I2510 Atherosclerotic heart disease of native coronary artery without angina pectoris: Secondary | ICD-10-CM | POA: Diagnosis not present

## 2022-03-27 DIAGNOSIS — F32A Depression, unspecified: Secondary | ICD-10-CM | POA: Diagnosis not present

## 2022-03-27 DIAGNOSIS — I739 Peripheral vascular disease, unspecified: Secondary | ICD-10-CM | POA: Diagnosis not present

## 2022-03-27 DIAGNOSIS — E1143 Type 2 diabetes mellitus with diabetic autonomic (poly)neuropathy: Secondary | ICD-10-CM | POA: Diagnosis not present

## 2022-03-27 DIAGNOSIS — Z4781 Encounter for orthopedic aftercare following surgical amputation: Secondary | ICD-10-CM | POA: Diagnosis not present

## 2022-03-27 DIAGNOSIS — F419 Anxiety disorder, unspecified: Secondary | ICD-10-CM | POA: Diagnosis not present

## 2022-03-27 DIAGNOSIS — W010XXA Fall on same level from slipping, tripping and stumbling without subsequent striking against object, initial encounter: Secondary | ICD-10-CM | POA: Diagnosis not present

## 2022-03-27 DIAGNOSIS — Z89611 Acquired absence of right leg above knee: Secondary | ICD-10-CM | POA: Diagnosis not present

## 2022-03-29 DIAGNOSIS — F419 Anxiety disorder, unspecified: Secondary | ICD-10-CM | POA: Diagnosis not present

## 2022-03-29 DIAGNOSIS — E1143 Type 2 diabetes mellitus with diabetic autonomic (poly)neuropathy: Secondary | ICD-10-CM | POA: Diagnosis not present

## 2022-03-29 DIAGNOSIS — I2510 Atherosclerotic heart disease of native coronary artery without angina pectoris: Secondary | ICD-10-CM | POA: Diagnosis not present

## 2022-03-29 DIAGNOSIS — I4891 Unspecified atrial fibrillation: Secondary | ICD-10-CM | POA: Diagnosis not present

## 2022-03-29 DIAGNOSIS — Z4781 Encounter for orthopedic aftercare following surgical amputation: Secondary | ICD-10-CM | POA: Diagnosis not present

## 2022-03-29 DIAGNOSIS — F32A Depression, unspecified: Secondary | ICD-10-CM | POA: Diagnosis not present

## 2022-03-29 DIAGNOSIS — I509 Heart failure, unspecified: Secondary | ICD-10-CM | POA: Diagnosis not present

## 2022-03-29 DIAGNOSIS — Z89611 Acquired absence of right leg above knee: Secondary | ICD-10-CM | POA: Diagnosis not present

## 2022-03-29 DIAGNOSIS — I739 Peripheral vascular disease, unspecified: Secondary | ICD-10-CM | POA: Diagnosis not present

## 2022-03-30 DIAGNOSIS — I2510 Atherosclerotic heart disease of native coronary artery without angina pectoris: Secondary | ICD-10-CM | POA: Diagnosis not present

## 2022-03-30 DIAGNOSIS — F32A Depression, unspecified: Secondary | ICD-10-CM | POA: Diagnosis not present

## 2022-03-30 DIAGNOSIS — Z4781 Encounter for orthopedic aftercare following surgical amputation: Secondary | ICD-10-CM | POA: Diagnosis not present

## 2022-03-30 DIAGNOSIS — Z89611 Acquired absence of right leg above knee: Secondary | ICD-10-CM | POA: Diagnosis not present

## 2022-03-30 DIAGNOSIS — F419 Anxiety disorder, unspecified: Secondary | ICD-10-CM | POA: Diagnosis not present

## 2022-03-30 DIAGNOSIS — E1143 Type 2 diabetes mellitus with diabetic autonomic (poly)neuropathy: Secondary | ICD-10-CM | POA: Diagnosis not present

## 2022-03-30 DIAGNOSIS — I739 Peripheral vascular disease, unspecified: Secondary | ICD-10-CM | POA: Diagnosis not present

## 2022-03-30 DIAGNOSIS — I4891 Unspecified atrial fibrillation: Secondary | ICD-10-CM | POA: Diagnosis not present

## 2022-03-30 DIAGNOSIS — I509 Heart failure, unspecified: Secondary | ICD-10-CM | POA: Diagnosis not present

## 2022-03-31 DIAGNOSIS — I4891 Unspecified atrial fibrillation: Secondary | ICD-10-CM | POA: Diagnosis not present

## 2022-03-31 DIAGNOSIS — Z89611 Acquired absence of right leg above knee: Secondary | ICD-10-CM | POA: Diagnosis not present

## 2022-03-31 DIAGNOSIS — Z4781 Encounter for orthopedic aftercare following surgical amputation: Secondary | ICD-10-CM | POA: Diagnosis not present

## 2022-03-31 DIAGNOSIS — I2510 Atherosclerotic heart disease of native coronary artery without angina pectoris: Secondary | ICD-10-CM | POA: Diagnosis not present

## 2022-03-31 DIAGNOSIS — I739 Peripheral vascular disease, unspecified: Secondary | ICD-10-CM | POA: Diagnosis not present

## 2022-03-31 DIAGNOSIS — E1143 Type 2 diabetes mellitus with diabetic autonomic (poly)neuropathy: Secondary | ICD-10-CM | POA: Diagnosis not present

## 2022-03-31 DIAGNOSIS — F32A Depression, unspecified: Secondary | ICD-10-CM | POA: Diagnosis not present

## 2022-03-31 DIAGNOSIS — F419 Anxiety disorder, unspecified: Secondary | ICD-10-CM | POA: Diagnosis not present

## 2022-03-31 DIAGNOSIS — I509 Heart failure, unspecified: Secondary | ICD-10-CM | POA: Diagnosis not present

## 2022-04-01 DIAGNOSIS — R079 Chest pain, unspecified: Secondary | ICD-10-CM | POA: Diagnosis not present

## 2022-04-01 DIAGNOSIS — J180 Bronchopneumonia, unspecified organism: Secondary | ICD-10-CM | POA: Diagnosis not present

## 2022-04-01 DIAGNOSIS — R0789 Other chest pain: Secondary | ICD-10-CM | POA: Diagnosis not present

## 2022-04-01 DIAGNOSIS — R0902 Hypoxemia: Secondary | ICD-10-CM | POA: Diagnosis not present

## 2022-04-01 DIAGNOSIS — R791 Abnormal coagulation profile: Secondary | ICD-10-CM | POA: Diagnosis not present

## 2022-04-01 DIAGNOSIS — I5021 Acute systolic (congestive) heart failure: Secondary | ICD-10-CM | POA: Diagnosis not present

## 2022-04-01 DIAGNOSIS — R069 Unspecified abnormalities of breathing: Secondary | ICD-10-CM | POA: Diagnosis not present

## 2022-04-01 DIAGNOSIS — J90 Pleural effusion, not elsewhere classified: Secondary | ICD-10-CM | POA: Diagnosis not present

## 2022-04-01 DIAGNOSIS — F419 Anxiety disorder, unspecified: Secondary | ICD-10-CM | POA: Diagnosis not present

## 2022-04-01 DIAGNOSIS — J80 Acute respiratory distress syndrome: Secondary | ICD-10-CM | POA: Diagnosis not present

## 2022-04-01 DIAGNOSIS — Z720 Tobacco use: Secondary | ICD-10-CM | POA: Diagnosis not present

## 2022-04-01 DIAGNOSIS — E1143 Type 2 diabetes mellitus with diabetic autonomic (poly)neuropathy: Secondary | ICD-10-CM | POA: Diagnosis not present

## 2022-04-01 DIAGNOSIS — I509 Heart failure, unspecified: Secondary | ICD-10-CM | POA: Diagnosis not present

## 2022-04-01 DIAGNOSIS — I214 Non-ST elevation (NSTEMI) myocardial infarction: Secondary | ICD-10-CM | POA: Diagnosis not present

## 2022-04-01 DIAGNOSIS — J9600 Acute respiratory failure, unspecified whether with hypoxia or hypercapnia: Secondary | ICD-10-CM | POA: Diagnosis not present

## 2022-04-01 DIAGNOSIS — I739 Peripheral vascular disease, unspecified: Secondary | ICD-10-CM | POA: Diagnosis not present

## 2022-04-01 DIAGNOSIS — Z4781 Encounter for orthopedic aftercare following surgical amputation: Secondary | ICD-10-CM | POA: Diagnosis not present

## 2022-04-01 DIAGNOSIS — Z89611 Acquired absence of right leg above knee: Secondary | ICD-10-CM | POA: Diagnosis not present

## 2022-04-01 DIAGNOSIS — F32A Depression, unspecified: Secondary | ICD-10-CM | POA: Diagnosis not present

## 2022-04-01 DIAGNOSIS — I2510 Atherosclerotic heart disease of native coronary artery without angina pectoris: Secondary | ICD-10-CM | POA: Diagnosis not present

## 2022-04-01 DIAGNOSIS — R059 Cough, unspecified: Secondary | ICD-10-CM | POA: Diagnosis not present

## 2022-04-01 DIAGNOSIS — I222 Subsequent non-ST elevation (NSTEMI) myocardial infarction: Secondary | ICD-10-CM | POA: Diagnosis not present

## 2022-04-01 DIAGNOSIS — I4891 Unspecified atrial fibrillation: Secondary | ICD-10-CM | POA: Diagnosis not present

## 2022-04-02 ENCOUNTER — Inpatient Hospital Stay (HOSPITAL_COMMUNITY): Payer: Medicare HMO

## 2022-04-02 ENCOUNTER — Encounter (HOSPITAL_COMMUNITY): Payer: Self-pay | Admitting: Internal Medicine

## 2022-04-02 ENCOUNTER — Inpatient Hospital Stay (HOSPITAL_COMMUNITY)
Admission: AD | Admit: 2022-04-02 | Discharge: 2022-04-10 | DRG: 280 | Disposition: A | Discharge status: Skilled Nursing Facility | Payer: Medicare HMO | Source: Other Acute Inpatient Hospital | Attending: Internal Medicine | Admitting: Internal Medicine

## 2022-04-02 DIAGNOSIS — E876 Hypokalemia: Secondary | ICD-10-CM | POA: Diagnosis present

## 2022-04-02 DIAGNOSIS — Z7401 Bed confinement status: Secondary | ICD-10-CM | POA: Diagnosis not present

## 2022-04-02 DIAGNOSIS — I739 Peripheral vascular disease, unspecified: Secondary | ICD-10-CM | POA: Diagnosis not present

## 2022-04-02 DIAGNOSIS — M1990 Unspecified osteoarthritis, unspecified site: Secondary | ICD-10-CM | POA: Diagnosis present

## 2022-04-02 DIAGNOSIS — E1151 Type 2 diabetes mellitus with diabetic peripheral angiopathy without gangrene: Secondary | ICD-10-CM | POA: Diagnosis present

## 2022-04-02 DIAGNOSIS — Z781 Physical restraint status: Secondary | ICD-10-CM

## 2022-04-02 DIAGNOSIS — E119 Type 2 diabetes mellitus without complications: Secondary | ICD-10-CM | POA: Diagnosis not present

## 2022-04-02 DIAGNOSIS — Z885 Allergy status to narcotic agent status: Secondary | ICD-10-CM

## 2022-04-02 DIAGNOSIS — I25110 Atherosclerotic heart disease of native coronary artery with unstable angina pectoris: Secondary | ICD-10-CM | POA: Diagnosis present

## 2022-04-02 DIAGNOSIS — N179 Acute kidney failure, unspecified: Secondary | ICD-10-CM | POA: Diagnosis not present

## 2022-04-02 DIAGNOSIS — G8929 Other chronic pain: Secondary | ICD-10-CM | POA: Diagnosis present

## 2022-04-02 DIAGNOSIS — R339 Retention of urine, unspecified: Secondary | ICD-10-CM | POA: Diagnosis present

## 2022-04-02 DIAGNOSIS — I5030 Unspecified diastolic (congestive) heart failure: Secondary | ICD-10-CM | POA: Diagnosis not present

## 2022-04-02 DIAGNOSIS — R791 Abnormal coagulation profile: Secondary | ICD-10-CM | POA: Diagnosis present

## 2022-04-02 DIAGNOSIS — N1831 Chronic kidney disease, stage 3a: Secondary | ICD-10-CM | POA: Diagnosis not present

## 2022-04-02 DIAGNOSIS — I480 Paroxysmal atrial fibrillation: Secondary | ICD-10-CM | POA: Diagnosis present

## 2022-04-02 DIAGNOSIS — I214 Non-ST elevation (NSTEMI) myocardial infarction: Secondary | ICD-10-CM | POA: Diagnosis not present

## 2022-04-02 DIAGNOSIS — I3139 Other pericardial effusion (noninflammatory): Secondary | ICD-10-CM | POA: Diagnosis present

## 2022-04-02 DIAGNOSIS — Z720 Tobacco use: Secondary | ICD-10-CM | POA: Diagnosis not present

## 2022-04-02 DIAGNOSIS — Z743 Need for continuous supervision: Secondary | ICD-10-CM | POA: Diagnosis not present

## 2022-04-02 DIAGNOSIS — F0392 Unspecified dementia, unspecified severity, with psychotic disturbance: Secondary | ICD-10-CM | POA: Diagnosis present

## 2022-04-02 DIAGNOSIS — R627 Adult failure to thrive: Secondary | ICD-10-CM | POA: Diagnosis present

## 2022-04-02 DIAGNOSIS — J9600 Acute respiratory failure, unspecified whether with hypoxia or hypercapnia: Secondary | ICD-10-CM | POA: Diagnosis not present

## 2022-04-02 DIAGNOSIS — I6523 Occlusion and stenosis of bilateral carotid arteries: Secondary | ICD-10-CM | POA: Diagnosis present

## 2022-04-02 DIAGNOSIS — S90415A Abrasion, left lesser toe(s), initial encounter: Secondary | ICD-10-CM | POA: Diagnosis present

## 2022-04-02 DIAGNOSIS — I130 Hypertensive heart and chronic kidney disease with heart failure and stage 1 through stage 4 chronic kidney disease, or unspecified chronic kidney disease: Secondary | ICD-10-CM | POA: Diagnosis not present

## 2022-04-02 DIAGNOSIS — I6381 Other cerebral infarction due to occlusion or stenosis of small artery: Secondary | ICD-10-CM | POA: Diagnosis not present

## 2022-04-02 DIAGNOSIS — Z89611 Acquired absence of right leg above knee: Secondary | ICD-10-CM

## 2022-04-02 DIAGNOSIS — F419 Anxiety disorder, unspecified: Secondary | ICD-10-CM | POA: Diagnosis present

## 2022-04-02 DIAGNOSIS — E118 Type 2 diabetes mellitus with unspecified complications: Principal | ICD-10-CM

## 2022-04-02 DIAGNOSIS — E1122 Type 2 diabetes mellitus with diabetic chronic kidney disease: Secondary | ICD-10-CM | POA: Diagnosis present

## 2022-04-02 DIAGNOSIS — J90 Pleural effusion, not elsewhere classified: Secondary | ICD-10-CM | POA: Diagnosis not present

## 2022-04-02 DIAGNOSIS — R7989 Other specified abnormal findings of blood chemistry: Secondary | ICD-10-CM | POA: Diagnosis not present

## 2022-04-02 DIAGNOSIS — Z7189 Other specified counseling: Secondary | ICD-10-CM | POA: Diagnosis not present

## 2022-04-02 DIAGNOSIS — Z888 Allergy status to other drugs, medicaments and biological substances status: Secondary | ICD-10-CM

## 2022-04-02 DIAGNOSIS — M6281 Muscle weakness (generalized): Secondary | ICD-10-CM | POA: Diagnosis not present

## 2022-04-02 DIAGNOSIS — I10 Essential (primary) hypertension: Secondary | ICD-10-CM | POA: Diagnosis present

## 2022-04-02 DIAGNOSIS — Z8249 Family history of ischemic heart disease and other diseases of the circulatory system: Secondary | ICD-10-CM

## 2022-04-02 DIAGNOSIS — Z7984 Long term (current) use of oral hypoglycemic drugs: Secondary | ICD-10-CM

## 2022-04-02 DIAGNOSIS — J189 Pneumonia, unspecified organism: Secondary | ICD-10-CM | POA: Diagnosis not present

## 2022-04-02 DIAGNOSIS — R5381 Other malaise: Secondary | ICD-10-CM | POA: Diagnosis present

## 2022-04-02 DIAGNOSIS — R0602 Shortness of breath: Secondary | ICD-10-CM | POA: Diagnosis not present

## 2022-04-02 DIAGNOSIS — E785 Hyperlipidemia, unspecified: Secondary | ICD-10-CM | POA: Diagnosis present

## 2022-04-02 DIAGNOSIS — J9811 Atelectasis: Secondary | ICD-10-CM | POA: Diagnosis not present

## 2022-04-02 DIAGNOSIS — E875 Hyperkalemia: Secondary | ICD-10-CM | POA: Diagnosis present

## 2022-04-02 DIAGNOSIS — I5021 Acute systolic (congestive) heart failure: Secondary | ICD-10-CM | POA: Diagnosis not present

## 2022-04-02 DIAGNOSIS — J180 Bronchopneumonia, unspecified organism: Secondary | ICD-10-CM | POA: Diagnosis not present

## 2022-04-02 DIAGNOSIS — Z716 Tobacco abuse counseling: Secondary | ICD-10-CM

## 2022-04-02 DIAGNOSIS — I5033 Acute on chronic diastolic (congestive) heart failure: Secondary | ICD-10-CM | POA: Diagnosis present

## 2022-04-02 DIAGNOSIS — I249 Acute ischemic heart disease, unspecified: Secondary | ICD-10-CM | POA: Diagnosis not present

## 2022-04-02 DIAGNOSIS — R293 Abnormal posture: Secondary | ICD-10-CM | POA: Diagnosis not present

## 2022-04-02 DIAGNOSIS — R9431 Abnormal electrocardiogram [ECG] [EKG]: Secondary | ICD-10-CM | POA: Diagnosis not present

## 2022-04-02 DIAGNOSIS — R059 Cough, unspecified: Secondary | ICD-10-CM | POA: Diagnosis not present

## 2022-04-02 DIAGNOSIS — R4182 Altered mental status, unspecified: Secondary | ICD-10-CM | POA: Diagnosis not present

## 2022-04-02 DIAGNOSIS — Z20822 Contact with and (suspected) exposure to covid-19: Secondary | ICD-10-CM | POA: Diagnosis not present

## 2022-04-02 DIAGNOSIS — L89152 Pressure ulcer of sacral region, stage 2: Secondary | ICD-10-CM | POA: Diagnosis not present

## 2022-04-02 DIAGNOSIS — Z7982 Long term (current) use of aspirin: Secondary | ICD-10-CM

## 2022-04-02 DIAGNOSIS — E782 Mixed hyperlipidemia: Secondary | ICD-10-CM | POA: Diagnosis not present

## 2022-04-02 DIAGNOSIS — I252 Old myocardial infarction: Secondary | ICD-10-CM

## 2022-04-02 DIAGNOSIS — Z9181 History of falling: Secondary | ICD-10-CM | POA: Diagnosis not present

## 2022-04-02 DIAGNOSIS — Z794 Long term (current) use of insulin: Secondary | ICD-10-CM | POA: Diagnosis not present

## 2022-04-02 DIAGNOSIS — Z91013 Allergy to seafood: Secondary | ICD-10-CM

## 2022-04-02 DIAGNOSIS — Z7901 Long term (current) use of anticoagulants: Secondary | ICD-10-CM

## 2022-04-02 DIAGNOSIS — Z79899 Other long term (current) drug therapy: Secondary | ICD-10-CM

## 2022-04-02 DIAGNOSIS — Z882 Allergy status to sulfonamides status: Secondary | ICD-10-CM

## 2022-04-02 DIAGNOSIS — I2510 Atherosclerotic heart disease of native coronary artery without angina pectoris: Secondary | ICD-10-CM | POA: Diagnosis not present

## 2022-04-02 DIAGNOSIS — R4587 Impulsiveness: Secondary | ICD-10-CM | POA: Diagnosis present

## 2022-04-02 DIAGNOSIS — F03918 Unspecified dementia, unspecified severity, with other behavioral disturbance: Secondary | ICD-10-CM | POA: Diagnosis not present

## 2022-04-02 DIAGNOSIS — Z66 Do not resuscitate: Secondary | ICD-10-CM | POA: Diagnosis present

## 2022-04-02 DIAGNOSIS — G9340 Encephalopathy, unspecified: Secondary | ICD-10-CM | POA: Diagnosis not present

## 2022-04-02 DIAGNOSIS — Z955 Presence of coronary angioplasty implant and graft: Secondary | ICD-10-CM

## 2022-04-02 DIAGNOSIS — R946 Abnormal results of thyroid function studies: Secondary | ICD-10-CM | POA: Diagnosis present

## 2022-04-02 DIAGNOSIS — F17210 Nicotine dependence, cigarettes, uncomplicated: Secondary | ICD-10-CM | POA: Diagnosis present

## 2022-04-02 DIAGNOSIS — R079 Chest pain, unspecified: Secondary | ICD-10-CM | POA: Diagnosis not present

## 2022-04-02 DIAGNOSIS — R531 Weakness: Secondary | ICD-10-CM | POA: Diagnosis not present

## 2022-04-02 DIAGNOSIS — Z96641 Presence of right artificial hip joint: Secondary | ICD-10-CM | POA: Diagnosis present

## 2022-04-02 LAB — BASIC METABOLIC PANEL
Anion gap: 13 (ref 5–15)
BUN: 55 mg/dL — ABNORMAL HIGH (ref 8–23)
CO2: 20 mmol/L — ABNORMAL LOW (ref 22–32)
Calcium: 8.3 mg/dL — ABNORMAL LOW (ref 8.9–10.3)
Chloride: 103 mmol/L (ref 98–111)
Creatinine, Ser: 2.14 mg/dL — ABNORMAL HIGH (ref 0.61–1.24)
GFR, Estimated: 32 mL/min — ABNORMAL LOW (ref >60)
Glucose, Bld: 256 mg/dL — ABNORMAL HIGH (ref 70–99)
Potassium: 6 mmol/L — ABNORMAL HIGH (ref 3.5–5.1)
Sodium: 136 mmol/L (ref 135–145)

## 2022-04-02 LAB — COMPREHENSIVE METABOLIC PANEL
ALT: 29 U/L (ref 0–44)
AST: 26 U/L (ref 15–41)
Albumin: 2.9 g/dL — ABNORMAL LOW (ref 3.5–5.0)
Alkaline Phosphatase: 78 U/L (ref 38–126)
Anion gap: 14 (ref 5–15)
BUN: 54 mg/dL — ABNORMAL HIGH (ref 8–23)
CO2: 18 mmol/L — ABNORMAL LOW (ref 22–32)
Calcium: 8.6 mg/dL — ABNORMAL LOW (ref 8.9–10.3)
Chloride: 106 mmol/L (ref 98–111)
Creatinine, Ser: 2.17 mg/dL — ABNORMAL HIGH (ref 0.61–1.24)
GFR, Estimated: 31 mL/min — ABNORMAL LOW (ref >60)
Glucose, Bld: 246 mg/dL — ABNORMAL HIGH (ref 70–99)
Potassium: 5.8 mmol/L — ABNORMAL HIGH (ref 3.5–5.1)
Sodium: 138 mmol/L (ref 135–145)
Total Bilirubin: 0.7 mg/dL (ref 0.3–1.2)
Total Protein: 6.8 g/dL (ref 6.5–8.1)

## 2022-04-02 LAB — LIPID PANEL
Cholesterol: 128 mg/dL (ref 0–200)
HDL: 44 mg/dL (ref >40)
LDL Cholesterol: 72 mg/dL (ref 0–99)
Total CHOL/HDL Ratio: 2.9 RATIO
Triglycerides: 58 mg/dL (ref <150)
VLDL: 12 mg/dL (ref 0–40)

## 2022-04-02 LAB — TROPONIN I (HIGH SENSITIVITY)
Troponin I (High Sensitivity): 1335 ng/L (ref <18)
Troponin I (High Sensitivity): 1635 ng/L (ref <18)

## 2022-04-02 LAB — CBC
HCT: 26.9 % — ABNORMAL LOW (ref 39.0–52.0)
Hemoglobin: 9.2 g/dL — ABNORMAL LOW (ref 13.0–17.0)
MCH: 30.8 pg (ref 26.0–34.0)
MCHC: 34.2 g/dL (ref 30.0–36.0)
MCV: 90 fL (ref 80.0–100.0)
Platelets: 252 10*3/uL (ref 150–400)
RBC: 2.99 MIL/uL — ABNORMAL LOW (ref 4.22–5.81)
RDW: 13.2 % (ref 11.5–15.5)
WBC: 12.4 10*3/uL — ABNORMAL HIGH (ref 4.0–10.5)
nRBC: 0 % (ref 0.0–0.2)

## 2022-04-02 LAB — DIGOXIN LEVEL: Digoxin Level: 0.9 ng/mL (ref 0.8–2.0)

## 2022-04-02 LAB — PHOSPHORUS: Phosphorus: 4.1 mg/dL (ref 2.5–4.6)

## 2022-04-02 LAB — PROTIME-INR
INR: 1.8 — ABNORMAL HIGH (ref 0.8–1.2)
Prothrombin Time: 20.6 seconds — ABNORMAL HIGH (ref 11.4–15.2)

## 2022-04-02 LAB — BRAIN NATRIURETIC PEPTIDE: B Natriuretic Peptide: 1319 pg/mL — ABNORMAL HIGH (ref 0.0–100.0)

## 2022-04-02 LAB — HEMOGLOBIN A1C
Hgb A1c MFr Bld: 5.4 % (ref 4.8–5.6)
Mean Plasma Glucose: 108.28 mg/dL

## 2022-04-02 LAB — TSH: TSH: 5.471 u[IU]/mL — ABNORMAL HIGH (ref 0.350–4.500)

## 2022-04-02 LAB — GLUCOSE, CAPILLARY
Glucose-Capillary: 246 mg/dL — ABNORMAL HIGH (ref 70–99)
Glucose-Capillary: 242 mg/dL — ABNORMAL HIGH (ref 70–99)

## 2022-04-02 LAB — MAGNESIUM: Magnesium: 2.1 mg/dL (ref 1.7–2.4)

## 2022-04-02 MED ORDER — NICOTINE 21 MG/24HR TD PT24
21.0000 mg | MEDICATED_PATCH | Freq: Every day | TRANSDERMAL | Status: DC
Start: 2022-04-02 — End: 2022-04-10
  Administered 2022-04-02 – 2022-04-10 (×8): 21 mg via TRANSDERMAL
  Filled 2022-04-02 (×9): qty 1

## 2022-04-02 MED ORDER — HEPARIN (PORCINE) 25000 UT/250ML-% IV SOLN
1900.0000 [IU]/h | INTRAVENOUS | Status: DC
Start: 2022-04-02 — End: 2022-04-06
  Administered 2022-04-02: 1100 [IU]/h via INTRAVENOUS
  Administered 2022-04-04: 1900 [IU]/h via INTRAVENOUS
  Administered 2022-04-04: 1700 [IU]/h via INTRAVENOUS
  Administered 2022-04-05 (×2): 1900 [IU]/h via INTRAVENOUS
  Filled 2022-04-02 (×6): qty 250

## 2022-04-02 MED ORDER — INSULIN GLARGINE SOLOSTAR 100 UNIT/ML ~~LOC~~ SOPN: 8.0000 [IU] | PEN_INJECTOR | Freq: Every day | SUBCUTANEOUS | Status: DC

## 2022-04-02 MED ORDER — HALOPERIDOL LACTATE 5 MG/ML IJ SOLN
INTRAMUSCULAR | Status: AC
  Filled 2022-04-02: qty 1

## 2022-04-02 MED ORDER — SODIUM CHLORIDE 0.9 % IV SOLN: 250.0000 mL | INTRAVENOUS | Status: DC | PRN

## 2022-04-02 MED ORDER — INSULIN ASPART 100 UNIT/ML IV SOLN
10.0000 [IU] | Freq: Once | INTRAVENOUS | Status: AC
  Administered 2022-04-02: 10 [IU] via INTRAVENOUS

## 2022-04-02 MED ORDER — ONDANSETRON HCL 4 MG/2ML IJ SOLN: 4.0000 mg | Freq: Four times a day (QID) | INTRAMUSCULAR | Status: DC | PRN

## 2022-04-02 MED ORDER — HEPARIN (PORCINE) 25000 UT/250ML-% IV SOLN: 1100.0000 [IU]/h | INTRAVENOUS | Status: DC

## 2022-04-02 MED ORDER — VALPROIC ACID 250 MG/5ML PO SOLN
250.0000 mg | Freq: Three times a day (TID) | ORAL | Status: DC
  Administered 2022-04-02 – 2022-04-03 (×3): 250 mg via ORAL
  Filled 2022-04-02 (×5): qty 5

## 2022-04-02 MED ORDER — METOPROLOL TARTRATE 25 MG PO TABS
25.0000 mg | ORAL_TABLET | Freq: Two times a day (BID) | ORAL | Status: DC
  Administered 2022-04-02 – 2022-04-05 (×6): 25 mg via ORAL
  Filled 2022-04-02 (×6): qty 1

## 2022-04-02 MED ORDER — INSULIN GLARGINE-YFGN 100 UNIT/ML ~~LOC~~ SOLN
8.0000 [IU] | Freq: Every day | SUBCUTANEOUS | Status: DC
  Administered 2022-04-02 – 2022-04-08 (×7): 8 [IU] via SUBCUTANEOUS
  Filled 2022-04-02 (×9): qty 0.08

## 2022-04-02 MED ORDER — HALOPERIDOL LACTATE 5 MG/ML IJ SOLN
2.0000 mg | Freq: Four times a day (QID) | INTRAMUSCULAR | Status: DC | PRN
Start: 2022-04-02 — End: 2022-04-02
  Administered 2022-04-02: 2 mg via INTRAVENOUS

## 2022-04-02 MED ORDER — SODIUM CHLORIDE 0.9% FLUSH: 3.0000 mL | INTRAVENOUS | Status: DC | PRN

## 2022-04-02 MED ORDER — NITROGLYCERIN 0.4 MG SL SUBL: 0.4000 mg | SUBLINGUAL_TABLET | SUBLINGUAL | Status: DC | PRN

## 2022-04-02 MED ORDER — SODIUM ZIRCONIUM CYCLOSILICATE 10 G PO PACK
10.0000 g | PACK | Freq: Three times a day (TID) | ORAL | Status: AC
  Administered 2022-04-02 – 2022-04-03 (×3): 10 g via ORAL
  Filled 2022-04-02 (×3): qty 1

## 2022-04-02 MED ORDER — SODIUM BICARBONATE 650 MG PO TABS
1300.0000 mg | ORAL_TABLET | Freq: Once | ORAL | Status: AC
  Administered 2022-04-02: 1300 mg via ORAL
  Filled 2022-04-02: qty 2

## 2022-04-02 MED ORDER — QUETIAPINE FUMARATE 50 MG PO TABS
100.0000 mg | ORAL_TABLET | Freq: Every day | ORAL | Status: DC
  Administered 2022-04-02 – 2022-04-05 (×4): 100 mg via ORAL
  Filled 2022-04-02 (×4): qty 2

## 2022-04-02 MED ORDER — QUETIAPINE FUMARATE 50 MG PO TABS: 100.0000 mg | ORAL_TABLET | Freq: Every day | ORAL | Status: DC

## 2022-04-02 MED ORDER — ASPIRIN 81 MG PO CHEW
81.0000 mg | CHEWABLE_TABLET | Freq: Every day | ORAL | Status: DC
Start: 2022-04-03 — End: 2022-04-04
  Administered 2022-04-03 – 2022-04-04 (×2): 81 mg via ORAL
  Filled 2022-04-02 (×2): qty 1

## 2022-04-02 MED ORDER — SERTRALINE HCL 50 MG PO TABS
75.0000 mg | ORAL_TABLET | Freq: Every day | ORAL | Status: DC
Start: 2022-04-03 — End: 2022-04-05
  Administered 2022-04-03 – 2022-04-05 (×3): 75 mg via ORAL
  Filled 2022-04-02 (×3): qty 2

## 2022-04-02 MED ORDER — SODIUM CHLORIDE 0.9% FLUSH
3.0000 mL | Freq: Two times a day (BID) | INTRAVENOUS | Status: DC
  Administered 2022-04-02 – 2022-04-10 (×10): 3 mL via INTRAVENOUS

## 2022-04-02 MED ORDER — ACETAMINOPHEN 325 MG PO TABS
650.0000 mg | ORAL_TABLET | ORAL | Status: DC | PRN
  Administered 2022-04-03 – 2022-04-10 (×7): 650 mg via ORAL
  Filled 2022-04-02 (×7): qty 2

## 2022-04-02 MED ORDER — NITROGLYCERIN 0.4 MG SL SUBL
0.4000 mg | SUBLINGUAL_TABLET | SUBLINGUAL | Status: AC
  Administered 2022-04-02 (×2): 0.4 mg via SUBLINGUAL

## 2022-04-02 MED ORDER — ROSUVASTATIN CALCIUM 5 MG PO TABS
5.0000 mg | ORAL_TABLET | Freq: Every day | ORAL | Status: DC
  Administered 2022-04-02: 5 mg via ORAL
  Filled 2022-04-02: qty 1

## 2022-04-02 MED ORDER — HALOPERIDOL LACTATE 5 MG/ML IJ SOLN
5.0000 mg | Freq: Four times a day (QID) | INTRAMUSCULAR | Status: DC | PRN
Start: 2022-04-02 — End: 2022-04-05
  Administered 2022-04-02 – 2022-04-04 (×4): 5 mg via INTRAVENOUS
  Filled 2022-04-02 (×4): qty 1

## 2022-04-02 MED ORDER — INSULIN ASPART 100 UNIT/ML IJ SOLN: 0.0000 [IU] | Freq: Every day | INTRAMUSCULAR | Status: DC

## 2022-04-02 MED ORDER — INSULIN ASPART 100 UNIT/ML IJ SOLN
0.0000 [IU] | Freq: Three times a day (TID) | INTRAMUSCULAR | Status: DC
  Administered 2022-04-03 – 2022-04-04 (×4): 2 [IU] via SUBCUTANEOUS
  Administered 2022-04-05: 3 [IU] via SUBCUTANEOUS
  Administered 2022-04-05: 2 [IU] via SUBCUTANEOUS
  Administered 2022-04-06: 3 [IU] via SUBCUTANEOUS
  Administered 2022-04-06 – 2022-04-07 (×2): 2 [IU] via SUBCUTANEOUS
  Administered 2022-04-07: 3 [IU] via SUBCUTANEOUS
  Administered 2022-04-08: 5 [IU] via SUBCUTANEOUS
  Administered 2022-04-09: 3 [IU] via SUBCUTANEOUS

## 2022-04-02 MED ORDER — DEXTROSE 50 % IV SOLN
25.0000 mL | Freq: Once | INTRAVENOUS | Status: AC
  Administered 2022-04-02: 25 mL via INTRAVENOUS
  Filled 2022-04-02: qty 50

## 2022-04-02 NOTE — Progress Notes (Addendum)
Pt becoming agitated. Pt is yelling, trying to get out of bed, pulling at lines and at foley. Attempted multiple times to reorient patient to situation. Other nurses also at beside to assist with calming patient. Paged attending requesting medication to help calm the patient. Review MAR and doctors orders.

## 2022-04-02 NOTE — Progress Notes (Signed)
Dr. Lorin Mercy made aware of critical lab value of troponin of 1335.   Also have made cardiology aware. Pt is diaphoretic, but unable to determine is this is due to pt being combative.

## 2022-04-02 NOTE — Progress Notes (Signed)
ANTICOAGULATION CONSULT NOTE - Initial Consult  Pharmacy Consult for heparin Indication: chest pain/ACS  Allergies  Allergen Reactions   Betadine [Povidone Iodine] Swelling    Facial swelling   Fish Allergy Swelling    Facial swelling. Many years ago.   Iodine Swelling    Facial swelling   Shellfish Allergy Swelling    Facial swelling   Sulfamethoxazole-Trimethoprim Swelling    Facial swelling   Gabapentin Other (See Comments)    Unknown reaction   Chlorhexidine    Trimethoprim Swelling    Facial swelling - Doesn't think he ever took this by itself.   Tramadol Other (See Comments)    Makes him crazy    Patient Measurements: Height: '6\' 1"'$  (185.4 cm) Weight: 85.9 kg (189 lb 6 oz) IBW/kg (Calculated) : 79.9 Heparin Dosing Weight: 85.9 kg  Vital Signs: Temp: 99 F (37.2 C) (07/15 1500) Temp Source: Oral (07/15 1500) BP: 132/78 (07/15 1552) Pulse Rate: 99 (07/15 1500)  Labs: No results for input(s): "HGB", "HCT", "PLT", "APTT", "LABPROT", "INR", "HEPARINUNFRC", "HEPRLOWMOCWT", "CREATININE", "CKTOTAL", "CKMB", "TROPONINIHS" in the last 72 hours.  CrCl cannot be calculated (Patient's most recent lab result is older than the maximum 21 days allowed.).   Medical History: Past Medical History:  Diagnosis Date   Arthritis    CAD (coronary artery disease)    a. s/p prior stenting of RCA and OM2 in 2006 b. s/p STEMI in 10/2017 with stenting of mid-RCA and PDA with DES x2   Chronic pain    Diabetes mellitus ([place])    Hyperlipidemia    Hypertension    STEMI (ST elevation myocardial infarction) ([place])    Tobacco use     Medications:  Medications Prior to Admission  Medication Sig Dispense Refill Last Dose   acetaminophen (TYLENOL) 500 MG tablet Take 1,000 mg by mouth every 6 (six) hours as needed for mild pain or headache.      aspirin 81 MG chewable tablet Chew 1 tablet by mouth daily.      digoxin (LANOXIN) 0.125 MG tablet Take 1 tablet by mouth daily.      folic acid  (FOLVITE) 1 MG tablet Take 1 tablet by mouth daily.      glipiZIDE (GLUCOTROL) 5 MG tablet Take 1 tablet by mouth daily.      Insulin Glargine Solostar (LANTUS) 100 UNIT/ML Solostar Pen Inject 8 Units into the skin at bedtime.      lisinopril (ZESTRIL) 2.5 MG tablet Take 2.5 mg by mouth daily.      metoprolol tartrate (LOPRESSOR) 25 MG tablet Take 1 tablet (25 mg total) by mouth 2 (two) times daily. 60 tablet 0    nicotine (NICODERM CQ - DOSED IN MG/24 HOURS) 21 mg/24hr patch Place 21 mg onto the skin daily.      nitroGLYCERIN (NITROSTAT) 0.4 MG SL tablet Place 1 tablet (0.4 mg total) under the tongue every 5 (five) minutes x 3 doses as needed for chest pain. (Patient not taking: Reported on 02/24/2022) 25 tablet 2    QUEtiapine (SEROQUEL) 100 MG tablet Take 1 tablet by mouth daily.      rosuvastatin (CRESTOR) 5 MG tablet Take 5 mg by mouth daily.      sertraline (ZOLOFT) 25 MG tablet Take 3 tablets by mouth daily.      silver sulfADIAZINE (SILVADENE) 1 % cream Apply 1 application topically daily.      valproic acid (DEPAKENE) 250 MG/5ML solution Take 5 mLs by mouth in the morning, at noon,  and at bedtime.      vitamin B-12 1000 MCG tablet Take 1 tablet (1,000 mcg total) by mouth daily.      warfarin (COUMADIN) 5 MG tablet Take 2.5 mg by mouth at bedtime.      Scheduled:   [START ON 04/03/2022] aspirin  81 mg Oral Daily   [START ON 04/03/2022] insulin aspart  0-15 Units Subcutaneous TID WC   insulin aspart  0-5 Units Subcutaneous QHS   insulin glargine-yfgn  8 Units Subcutaneous QHS   metoprolol tartrate  25 mg Oral BID   nicotine  21 mg Transdermal Daily   [START ON 04/03/2022] QUEtiapine  100 mg Oral Daily   rosuvastatin  5 mg Oral Daily   [START ON 04/03/2022] sertraline  75 mg Oral Daily   sodium chloride flush  3 mL Intravenous Q12H   valproic acid  250 mg Oral TID   Infusions:   sodium chloride      Assessment: 76 y.o M patient presented with chest pain to outside facility  [place] and transferred to [facility]. 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  Chest pain is worse when doing things and has gotten worse today (7/15) especially with coughing. Denied improvement with SL NTG, but RN reported improvement in chest pain with SL NTG from 10--> 3.  ER course at [facility]: According to chart, patient received '5mg'$  of Vit. K for INR 6.9 with no s/sx of bleeding. CT Angio chest negative for PE but showed multifocal pneumonia and right loculated pleural effusion. Hypertensive with flash pulmonary edema on CXR.  HS troponin 15 -> 63.  No evidence of acute ischemia on 12 lead EKG.  Pharmacy consulted to dose heparin. Patient has no s/sx of bleeding according to the nurse, INR came back at 1.8, no other labs at this time.  Goal of Therapy:  INR below the patient's goal (2-3), aPTT does not need monitored. Heparin level 0.3-0.7 units/ml Monitor platelets by anticoagulation protocol: Yes   Plan:  Given not able to confirm patient's last dose and strength of warfarin and high INR of 6.9 this morning, no bolus should be given at this time. Start heparin infusion at 1100 units/hr Check anti-Xa level in 8 hours and daily while on heparin Continue to monitor H&H and platelets   Sandford Craze 04/02/2022,5:15 PM

## 2022-04-02 NOTE — Progress Notes (Signed)
Bilateral wrist restraints applied

## 2022-04-02 NOTE — Progress Notes (Signed)
Pt is calm & more oriented, compliant in taking his medications, restraints removed. Will continue to monitor.   Elaina Hoops, RN

## 2022-04-02 NOTE — H&P (Signed)
History and Physical    Patient: Dennis [name] FVC:[id number] DOB: 29-Jul-1946 DOA: 04/02/2022 DOS: the patient was seen and examined on 04/02/2022 PCP: Glenda Chroman, MD  Patient coming from: Home - lives with wife; NOK: Wife, [phone]   Chief Complaint: chest pain  HPI: ELDON ZIETLOW is a 76 y.o. 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  He reports chest pain started 2 days ago.  It may have been exertional.  It comes and goes.  Better with rest.  Worse with doing thing and at nights.   Pain is substernal. He quit smoking about 2 weeks ago.    +SOB, separate from CP.  +cough, unchanged from prior.  No fever. The pain is worse today, worse with coughing.  It is currently 5/10.  He has a foley (although he isn't really sure if he does).  He denied improvement with NTG but also didn't think he had been given any; RN reported improvement from 10 -> 3 with NTG.  She reports that "he hasn't had too much to say about it."  She went out and when he came back "he was ready to go", didn't eat much and he wasn't feeling well. Wife is a worse historian than him.  He "has never been diagnosed but I believe he does have dementia."  She doesn't think he would want resuscitation.    ER Course:  [place] to [facility] transfer, per Dr. Nevada Crane:  Sudden onset non exertional shortness of breath and chest pain while sitting in his house on the day of presentation.    Hypertensive with flash pulmonary edema on CXR.  HS troponin 15 -> 63.  No evidence of acute ischemia on 12 lead EKG.  BNP elevated 671.  INR 6.9.  Lower extremity edema on exam.  CT Angio chest negative for PE but showed multifocal pneumonia and right loculated pleural effusion.  Started on empiric IV antibiotics Rocephin and azithromycin, due to concern for  community-acquired pneumonia.  To note, the patient completed oral antibiotics for left toe cellulitis about 4-5 days ago.    Patient received IV Lasix in the ED and Nitro paste due to concern for flash pulmonary edema.  COVID-19 screening test was negative.  [facility] does not have cardiology service at this time.     Review of Systems: As mentioned in the history of present illness. All other systems reviewed and are negative. Past Medical History:  Diagnosis Date   Arthritis    CAD (coronary artery disease)    a. s/p prior stenting of RCA and OM2 in 2006 b. s/p STEMI in 10/2017 with stenting of mid-RCA and PDA with DES x2   Chronic pain    Diabetes mellitus ([place])    Hyperlipidemia    Hypertension    STEMI (ST elevation myocardial infarction) ([place])    Tobacco use    Past Surgical History:  Procedure Laterality Date   ABDOMINAL AORTOGRAM W/LOWER EXTREMITY Bilateral 06/07/2019   Procedure: ABDOMINAL AORTOGRAM W/LOWER EXTREMITY;  Surgeon: Elam Dutch, MD;  Location: [facility];  Service: Cardiovascular;  Laterality: Bilateral;   CORONARY STENT INTERVENTION N/A 11/13/2017   Procedure: CORONARY STENT INTERVENTION;  Surgeon: Martinique, Peter M, MD;  Location: [facility];  Service: Cardiovascular;  Laterality: N/A;   CORONARY/GRAFT ACUTE MI REVASCULARIZATION N/A 11/13/2017  Procedure: Coronary/Graft Acute MI Revascularization;  Surgeon: Martinique, Peter M, MD;  Location: [place] [facility];  Service: Cardiovascular;  Laterality: N/A;   FEMORAL-TIBIAL BYPASS GRAFT Right 07/01/2019   Procedure: Right  FEMORAL- POSTERIOR TIBIAL ARTERY BYPASS GRAFT with Composite PTFE and Reversed Saphenous Vein, RIGHT Common FEMORAL ENDARTERECTOMY with Profundoplasty;  Surgeon: Elam Dutch, MD;  Location: [facility];  Service: Vascular;  Laterality: Right;   LEFT HEART CATH AND CORONARY ANGIOGRAPHY N/A 11/13/2017   Procedure: LEFT HEART CATH AND CORONARY ANGIOGRAPHY;  Surgeon: Martinique, Peter  M, MD;  Location: [place];  Service: Cardiovascular;  Laterality: N/A;   TOTAL HIP ARTHROPLASTY Right 12/18/2017   Procedure: TOTAL HIP ARTHROPLASTY ANTERIOR APPROACH;  Surgeon: Rod Can, MD;  Location: [facility];  Service: Orthopedics;  Laterality: Right;   Social History:  reports that he quit smoking about 2 weeks ago. His smoking use included cigarettes. He has a 59.00 pack-year smoking history. He has never used smokeless tobacco. He reports that he does not currently use alcohol. He reports current drug use. Drug: Marijuana.  Allergies  Allergen Reactions   Betadine [Povidone Iodine] Swelling    Facial swelling   Fish Allergy Swelling    Facial swelling. Many years ago.   Iodine Swelling    Facial swelling   Shellfish Allergy Swelling    Facial swelling   Sulfamethoxazole-Trimethoprim Swelling    Facial swelling   Gabapentin Other (See Comments)    Unknown reaction   Chlorhexidine    Trimethoprim Swelling    Facial swelling - Doesn't think he ever took this by itself.   Tramadol Other (See Comments)    Makes him crazy    Family History  Problem Relation Age of Onset   Hypertension Father    Heart disease Father     Prior to Admission medications   Medication Sig Start Date End Date Taking? Authorizing Provider  acetaminophen (TYLENOL) 500 MG tablet Take 1,000 mg by mouth every 6 (six) hours as needed for mild pain or headache.    [provider]  aspirin 81 MG chewable tablet Chew 1 tablet by mouth daily. 02/05/22   [provider]  digoxin (LANOXIN) 0.125 MG tablet Take 1 tablet by mouth daily. 02/05/22   [provider]  folic acid (FOLVITE) 1 MG tablet Take 1 tablet by mouth daily. 02/05/22   [provider]  glipiZIDE (GLUCOTROL) 5 MG tablet Take 1 tablet by mouth daily.    [provider]  Insulin Glargine Solostar (LANTUS) 100 UNIT/ML Solostar Pen Inject 8 Units into the skin at bedtime.    [provider]  lisinopril (ZESTRIL) 2.5 MG tablet Take 2.5 mg by mouth daily. 12/09/19   [provider]  metoprolol tartrate (LOPRESSOR) 25 MG tablet Take 1 tablet (25 mg total) by mouth 2 (two) times daily. 07/27/20   Arnoldo Lenis, MD  nicotine (NICODERM CQ - DOSED IN MG/24 HOURS) 21 mg/24hr patch Place 21 mg onto the skin daily.    [provider]  nitroGLYCERIN (NITROSTAT) 0.4 MG SL tablet Place 1 tablet (0.4 mg total) under the tongue every 5 (five) minutes x 3 doses as needed for chest pain. Patient not taking: Reported on 02/24/2022 11/15/17   Reino Bellis B, NP  QUEtiapine (SEROQUEL) 100 MG tablet Take 1 tablet by mouth daily. 02/05/22   [provider]  rosuvastatin (CRESTOR) 5 MG tablet Take 5 mg by mouth daily. 01/03/22   [provider]  sertraline (ZOLOFT) 25 MG tablet Take 3 tablets by mouth daily. 02/05/22   [provider]  silver sulfADIAZINE (SILVADENE) 1 % cream Apply 1 application topically daily.    [provider]  valproic acid (DEPAKENE) 250 MG/5ML solution Take 5 mLs by mouth in the morning, at noon, and at bedtime. 02/05/22   [provider]  vitamin B-12 1000 MCG tablet Take 1 tablet (1,000 mcg total) by mouth daily. 12/26/17   Hongalgi, Lenis Dickinson, MD  warfarin (COUMADIN) 5 MG tablet Take 2.5 mg by mouth at bedtime.    [provider]    Physical Exam: Vitals:   04/02/22 1536 04/02/22 1547 04/02/22 1552 04/02/22 1700  BP: (!) 149/80 128/76 132/78   Pulse:      Resp: '16 18 16   '$ Temp:      TempSrc:      SpO2: 98% 95% 94%   Weight:    85.9 kg  Height:    '6\' 1"'$  (1.854 m)   General:  Appears calm and comfortable and is in NAD, somewhat confused, poor historian Eyes:   EOMI, normal lids, iris ENT:  grossly normal hearing, lips & tongue, mmm; artificial dentition Neck:  no LAD, masses or thyromegaly Cardiovascular:  RRR, no m/r/g. No LE edema. Reproducible pain Respiratory:   CTA bilaterally with no  wheezes/rales/rhonchi.  Normal respiratory effort. Abdomen:  soft, mild epigastric pain, ND Skin:  no rash or induration seen on limited exam Musculoskeletal:  RLE AKA, well healed Left Lower extremity:  No LE edema.  Limited foot exam with no ulcerations.  2+ distal pulses. Psychiatric:  mildly confused mood and affect, speech fluent and intermittently appropriate (for example, reported toe amputation 2 weeks ago - but left toes are intact and RLE is s/p AKA 2 months ago) Neurologic:  CN 2-12 grossly intact, moves all extremities in coordinated fashion   Radiological Exams on Admission: Independently reviewed - see discussion in A/P where applicable  No results found.  EKG: Independently reviewed.  NSR with rate 106 -> 100; lateral ST depression at [facility] that has improved on EKG currently   Labs on Admission: I have personally reviewed the available labs and imaging studies at the time of the admission.  Pertinent labs at [place]:  WBC 10 Hgb 10 BUN 41/Creatinine 1.56/GFR 46; baseline 1.22/56 BNP 671 ABG: 7.28/47/66/21/89% on room air Lactate, procalcitonin WNL     Assessment and Plan: Principal Problem:   ACS (acute coronary syndrome) (HCC) Active Problems:   Tobacco abuse   Hyperlipidemia   Hypertension   PAF (paroxysmal atrial fibrillation) (HCC)   PAD (peripheral artery disease) (HCC)   Type 2 diabetes mellitus with complication, with long-term current use of insulin (HCC)   Dementia with behavioral disturbance (HCC)   Chronic kidney disease, stage 3a ([place])    ACS -Patient with substernal chest pain that came on acutely with exertion (this is questionable) and improved with rest/NTG. -3/3 typical symptoms suggestive of typical angina. -CTA at [facility] reportedly negative for PE but with multilobar PNA and R loculated pleural effusion; will repeat CXR here but he does not have clinical evidence of PNA and so will not continue antibiotics currently.   -Initial HS troponin  15 ->63; will repeat.   -EKG with apparent lateral ST depression at, no apparent STEMI, improved on EKG here. -Will admit since the patient has positive troponins and/or an abnormal EKG with angina necessitating acute intervention. -Will start heparin per pharmacy - although will check INR  first since reported INR at [facility] was 6.9 (no evidence of active bleeding so reversal does not appear to be needed) so this may not be needed for some time yet -Risk factor stratification with HgbA1c and FLP; will also check TSH and UDS -Patient appears likely to need invasive evaluation (cardiac catheterization) based on concerning history, pertinent symptoms, elevated troponin  -Continue ASA 81 mg daily -NTG for symptom relief (although there is no mortality benefit) -Continue Lopressor  HTN -Continue lopressor -Hold lisinopril -Will also add prn hydralazine  HLD -Continue Crestor -Check lipids  DM -Check A1c -Hold glipizide -Continue Lantus qhs -Will cover with moderate-scale SSI for now  Afib -On Digoxin and Lopressor for rate control; will hold Dig and continue Lopressor -He is on Coumadin with reported INR 6.9; rechecking INR and holding Coumadin with plan to start heparin when appropriate per pharmacy  Dementia/Mood d/o -Will order delirium precautions -Continue Seroquel, Sertraline, Depakote  PVD -s/p R AKA, well healed -He reported toe amputation 2 weeks ago but there is no evidence of this  Stage 3a CKD -Slightly worse than baseline -Hold lisinopril -Recheck BMP in AM  Tobacco dependence -Reports quitting 2 weeks ago, continue to encourage cessation.   -Patch ordered   Sacral pressure ulcer -RN noted stage 2 sacral pressure ulcer, present on admission -Would request wound care consult  DNR -I have discussed code status with the patient and he asked that I ask his wife, who reported that the patient would not desire resuscitation and would prefer to die a natural death  should that situation arise. -He will need a gold out of facility DNR form at the time of discharge     Advance Care Planning:   Code Status: DNR   Consults: Cardiology; wound care  DVT Prophylaxis: Heparin (eventually)  Family Communication: None present; I spoke with his wife by telephone at the time of admission  Severity of Illness: The appropriate patient status for this patient is INPATIENT. Inpatient status is judged to be reasonable and necessary in order to provide the required intensity of service to ensure the patient's safety. The patient's presenting symptoms, physical exam findings, and initial radiographic and laboratory data in the context of their chronic comorbidities is felt to place them at high risk for further clinical deterioration. Furthermore, it is not anticipated that the patient will be medically stable for discharge from the hospital within 2 midnights of admission.   * I certify that at the point of admission it is my clinical judgment that the patient will require inpatient hospital care spanning beyond 2 midnights from the point of admission due to high intensity of service, high risk for further deterioration and high frequency of surveillance required.*  Author: Karmen Bongo, MD 04/02/2022 5:07 PM  For on call review [email or web address].

## 2022-04-02 NOTE — Progress Notes (Signed)
Hyperkalemia K+6.0, no peaked T waves on twelve-lead EKG.  Treated with IV insulin 10 units x 1, D50 25 cc x 1.  Additionally, started on Lokelma 10 g 3 times daily x3 doses.  Repeat renal panel in the morning.

## 2022-04-02 NOTE — Progress Notes (Signed)
Paged attending and advised arrive of patient.

## 2022-04-02 NOTE — Consult Note (Signed)
Cardiology Consultation:   Patient ID: Dennis [name] MRN: [id number]; DOB: 04-03-1946  Admit date: 04/02/2022 Date of Consult: 04/02/2022  PCP:  Glenda Chroman, MD   [facility] Prov[facility] Cardiologist:  Carlyle Dolly, MD        Patient Profile:   Dennis [name] is a 76 y.o. male with a hx of cognitive impairment, CAD s/p multiple PCIs, ongoing tobacco abuse, DMT2, HTN, HLD, anemia, PAF on Coumadin, chronic diastolic CHF, cartoid artery stenosis, PAD s/p R AKA (01/25/22) who is being seen 04/02/2022 for the evaluation of chest pain at the request of Dr. Lorin Mercy.  History of Present Illness:   Patient is seen at bedside, alert, awake, agitated and confused.  Limited medical history was obtained from medical staff and chart reviewed.  Nurses just gave him one dose of haloperidol. Per medical team, the patient complained intermittent chest pain which was worse with cough upon arrival, and then he started complaining of whole-body aches. Patient is currently tachycardia (110-120s in Afib), BP stable, on 2L O2, and feels warm to me.   Of note, the patient presented [facility] on 7/14 with c/o left upper chest pain associated with cough, diaphoresis, and SOB. Reported chest pain as dull and heaviness, alleviated by rest, and aggravated by nothing. His INR was found to be 6.9 and was given Vitamin K at OSH. His wife a poor historian.   On admission from OSH  HS troponin 33 -> 63->332, BNP 671 (0-100), wbc 10.6, Hgb 10.6, Na 141, K 4.8, Cr 1.58 (Cr was 1.07 01/2022), D-dimer 14.04, COVID negative,  CTA negative for PE, pleural effusions (++), , multiple pneumonia, INR 6.9  ECG @ 04/01/22 20:51. SR, STD (horizontal) lateral leads (personal review)  "He has a history of CAD with PCI/DESx mRCA, and PDA x1 in 2019. Had been on triple therapy, found to be anemic during 12/2017 admission and ASA was stopped (has had some issues with hematuria around that time as well) and continued on  plavix and eliquis. Nuclear stress test in 05/2019 did not show ischemia and overall low risk".   TTE 01/2022  1. Overall left ventricular ejection fraction is estimated at 50 to 55%.   2. Low normal global left ventricular systolic function.   3. There is no evidence of pericardial effusion.   Past Medical History:  Diagnosis Date   Arthritis    CAD (coronary artery disease)    a. s/p prior stenting of RCA and OM2 in 2006 b. s/p STEMI in 10/2017 with stenting of mid-RCA and PDA with DES x2   Chronic pain    Diabetes mellitus ([place])    Hyperlipidemia    Hypertension    STEMI (ST elevation myocardial infarction) ([place])    Tobacco use     Past Surgical History:  Procedure Laterality Date   ABDOMINAL AORTOGRAM W/LOWER EXTREMITY Bilateral 06/07/2019   Procedure: ABDOMINAL AORTOGRAM W/LOWER EXTREMITY;  Surgeon: Elam Dutch, MD;  Location: [employer] [facility] [facility];  Service: Cardiovascular;  Laterality: Bilateral;   CORONARY STENT INTERVENTION N/A 11/13/2017   Procedure: CORONARY STENT INTERVENTION;  Surgeon: Martinique, Peter M, MD;  Location: [facility];  Service: Cardiovascular;  Laterality: N/A;   CORONARY/GRAFT ACUTE MI REVASCULARIZATION N/A 11/13/2017   Procedure: Coronary/Graft Acute MI Revascularization;  Surgeon: Martinique, Peter M, MD;  Location: [facility] [facility];  Service: Cardiovascular;  Laterality: N/A;   FEMORAL-TIBIAL BYPASS GRAFT Right 07/01/2019   Procedure: Right  FEMORAL- POSTERIOR TIBIAL ARTERY BYPASS  GRAFT with Composite PTFE and Reversed Saphenous Vein, RIGHT Common FEMORAL ENDARTERECTOMY with Profundoplasty;  Surgeon: Elam Dutch, MD;  Location: [facility] OR;  Service: Vascular;  Laterality: Right;   LEFT HEART CATH AND CORONARY ANGIOGRAPHY N/A 11/13/2017   Procedure: LEFT HEART CATH AND CORONARY ANGIOGRAPHY;  Surgeon: Martinique, Peter M, MD;  Location: [place] [facility];  Service: Cardiovascular;  Laterality: N/A;   TOTAL HIP ARTHROPLASTY Right 12/18/2017   Procedure: TOTAL  HIP ARTHROPLASTY ANTERIOR APPROACH;  Surgeon: Rod Can, MD;  Location: [facility];  Service: Orthopedics;  Laterality: Right;     Inpatient Medications: Scheduled Meds:  [START ON 04/03/2022] aspirin  81 mg Oral Daily   [START ON 04/03/2022] insulin aspart  0-15 Units Subcutaneous TID WC   insulin aspart  0-5 Units Subcutaneous QHS   insulin glargine-yfgn  8 Units Subcutaneous QHS   metoprolol tartrate  25 mg Oral BID   nicotine  21 mg Transdermal Daily   [START ON 04/03/2022] QUEtiapine  100 mg Oral Daily   rosuvastatin  5 mg Oral Daily   [START ON 04/03/2022] sertraline  75 mg Oral Daily   sodium chloride flush  3 mL Intravenous Q12H   valproic acid  250 mg Oral TID   Continuous Infusions:  sodium chloride     heparin     PRN Meds: sodium chloride, acetaminophen, haloperidol lactate, nitroGLYCERIN, ondansetron (ZOFRAN) IV, sodium chloride flush  Allergies:    Allergies  Allergen Reactions   Betadine [Povidone Iodine] Swelling    Facial swelling   Fish Allergy Swelling    Facial swelling. Many years ago.   Iodine Swelling    Facial swelling   Shellfish Allergy Swelling    Facial swelling   Sulfamethoxazole-Trimethoprim Swelling    Facial swelling   Gabapentin Other (See Comments)    Unknown reaction   Chlorhexidine    Trimethoprim Swelling    Facial swelling - Doesn't think he ever took this by itself.   Tramadol Other (See Comments)    Makes him crazy    Social History:   Social History   Socioeconomic History   Marital status: Married    Spouse name: Not on file   Number of children: Not on file   Years of education: Not on file   Highest education level: Not on file  Occupational History   Occupation: retired  Tobacco Use   Smoking status: Former    Packs/day: 1.00    Years: 59.00    Total pack years: 59.00    Types: Cigarettes    Quit date: 03/19/2022    Years since quitting: 0.0   Smokeless tobacco: Never  Vaping Use   Vaping Use: Never used   Substance and Sexual Activity   Alcohol use: Not Currently    Comment: h/o heavy use   Drug use: Yes    Types: Marijuana   Sexual activity: Not on file  Other Topics Concern   Not on file  Social History Narrative   Not on file   Social Determinants of Health   Financial Resource Strain: Not on file  Food Insecurity: Not on file  Transportation Needs: Not on file  Physical Activity: Not on file  Stress: Not on file  Social Connections: Not on file  Intimate Partner Violence: Not on file    Family History:    Family History  Problem Relation Age of Onset   Hypertension Father    Heart disease Father      ROS:  Please see the history of present illness.   All other ROS reviewed and negative.     Physical Exam/Data:   Vitals:   04/02/22 1536 04/02/22 1547 04/02/22 1552 04/02/22 1700  BP: (!) 149/80 128/76 132/78   Pulse:      Resp: '16 18 16   '$ Temp:      TempSrc:      SpO2: 98% 95% 94%   Weight:    85.9 kg  Height:    '6\' 1"'$  (1.854 m)   No intake or output data in the 24 hours ending 04/02/22 1833    04/02/2022    5:00 PM 02/24/2022    9:40 AM 01/25/2020    5:10 PM  Last 3 Weights  Weight (lbs) 189 lb 6 oz 163 lb 12.8 oz 220 lb  Weight (kg) 85.9 kg 74.299 kg 99.791 kg     Body mass index is 24.99 kg/m.  General:  agitated HEENT: normal Neck: no JVD Vascular: No carotid bruits; Distal pulses 2+ bilaterally Cardiac:  irregularly irregular rhythm with tachycardia RRR; no murmur  Lungs: coarse breath sounds Abd: soft, nontender, no hepatomegaly  Ext: no edema Musculoskeletal:  s/p R AKA,  Skin: warm and dry  Neuro:  move UE/LE freely Psych: agitated    Relevant CV Studies:  Laboratory Data:  High Sensitivity Troponin:   Recent Labs  Lab 04/02/22 1656  TROPONINIHS 1,335*     Chemistry Recent Labs  Lab 04/02/22 1656  NA 138  K 5.8*  CL 106  CO2 18*  GLUCOSE 246*  BUN 54*  CREATININE 2.17*  CALCIUM 8.6*  GFRNONAA 31*  ANIONGAP 14     Recent Labs  Lab 04/02/22 1656  PROT 6.8  ALBUMIN 2.9*  AST 26  ALT 29  ALKPHOS 78  BILITOT 0.7   Lipids  Recent Labs  Lab 04/02/22 1656  CHOL 128  TRIG 58  HDL 44  LDLCALC 72  CHOLHDL 2.9    HematologyNo results for input(s): "WBC", "RBC", "HGB", "HCT", "MCV", "MCH", "MCHC", "RDW", "PLT" in the last 168 hours. Thyroid  Recent Labs  Lab 04/02/22 1656  TSH 5.471*    BNP Recent Labs  Lab 04/02/22 1656  BNP 1,319.0*    DDimer No results for input(s): "DDIMER" in the last 168 hours.   Radiology/Studies:  DG CHEST PORT 1 VIEW  Result Date: 04/02/2022 CLINICAL DATA:  Chest pain of uncertain etiology EXAM: PORTABLE CHEST 1 VIEW COMPARISON:  06/07/2021 FINDINGS: Single frontal view of the chest demonstrates an enlarged cardiac silhouette. There is congestion of the central pulmonary vasculature, with bilateral perihilar airspace disease. Additionally, there are bilateral pleural effusions, right much larger than left. The right pleural effusion may be partially loculated. No pneumothorax. IMPRESSION: 1. Constellation of findings most consistent with congestive heart failure, though underlying infection would be difficult to exclude. Radiographic follow-up after appropriate medical management is recommended. Electronically Signed   By: Randa Ngo M.D.   On: 04/02/2022 17:19     Assessment and Plan:   #Chest pain #Elevated troponins -Due to patient's current mental status and hx of cognitive impairment, unclear the characteristics of his chest pain. Per OSH note, appears to be NSTEMI. OSH records showed troponins 33 -> 63->332, one ECG from OSH 04/01/22 demonstrated STD (horizontal) lateral leads -continue Telemetry and trend troponins until peaks -EKG PRN -agree with IV heparin per pharmacy consult given his supratherapeutic INR at OSH x 48 hours  -start ASA (monitor any bleeding events and Hgb), continue  home crestor and metoprolol -agree with a TTE -Given his AKI,  presumable pneumonia (imaging evidence) and acute encephalopathy, we will determine the timing of his ischemic workup pending his clinical course  #pAF -electrolytes optimization per the primary team -continue rate control with metoprolol, hold digoxin given AKI and check digoxin level -continue anticoagulation   #Acute encephalopathy #Acute on CKD #T2DM #HTN -Mgmt per primary team   Risk Assessment/Risk Scores:     TIMI Risk Score for Unstable Angina or Non-ST Elevation MI:   The patient's TIMI risk score is 5, which indicates a 26% risk of all cause mortality, new or recurrent myocardial infarction or need for urgent revascularization in the next 14 days.    CHA2DS2-VASc Score = 5      For questions or updates, please contact [place] Please consult [email or web address] for contact info under    Signed, Laurice Record, MD  04/02/2022 6:33 PM

## 2022-04-03 ENCOUNTER — Other Ambulatory Visit (HOSPITAL_COMMUNITY): Payer: Medicare HMO

## 2022-04-03 DIAGNOSIS — E782 Mixed hyperlipidemia: Secondary | ICD-10-CM

## 2022-04-03 DIAGNOSIS — I10 Essential (primary) hypertension: Secondary | ICD-10-CM | POA: Diagnosis not present

## 2022-04-03 DIAGNOSIS — I480 Paroxysmal atrial fibrillation: Secondary | ICD-10-CM

## 2022-04-03 DIAGNOSIS — E118 Type 2 diabetes mellitus with unspecified complications: Secondary | ICD-10-CM

## 2022-04-03 DIAGNOSIS — N1831 Chronic kidney disease, stage 3a: Secondary | ICD-10-CM | POA: Diagnosis not present

## 2022-04-03 DIAGNOSIS — I739 Peripheral vascular disease, unspecified: Secondary | ICD-10-CM

## 2022-04-03 DIAGNOSIS — Z794 Long term (current) use of insulin: Secondary | ICD-10-CM

## 2022-04-03 DIAGNOSIS — I5033 Acute on chronic diastolic (congestive) heart failure: Secondary | ICD-10-CM

## 2022-04-03 DIAGNOSIS — I249 Acute ischemic heart disease, unspecified: Secondary | ICD-10-CM | POA: Diagnosis not present

## 2022-04-03 DIAGNOSIS — I214 Non-ST elevation (NSTEMI) myocardial infarction: Secondary | ICD-10-CM

## 2022-04-03 LAB — URINALYSIS, ROUTINE W REFLEX MICROSCOPIC
Bilirubin Urine: NEGATIVE
Glucose, UA: NEGATIVE mg/dL
Ketones, ur: NEGATIVE mg/dL
Leukocytes,Ua: NEGATIVE
Nitrite: NEGATIVE
Protein, ur: 30 mg/dL — AB
RBC / HPF: >50 RBC/hpf — ABNORMAL HIGH (ref 0–5)
Specific Gravity, Urine: 1.024 (ref 1.005–1.030)
pH: 5 (ref 5.0–8.0)

## 2022-04-03 LAB — CBC
HCT: 26.6 % — ABNORMAL LOW (ref 39.0–52.0)
Hemoglobin: 8.9 g/dL — ABNORMAL LOW (ref 13.0–17.0)
MCH: 30.2 pg (ref 26.0–34.0)
MCHC: 33.5 g/dL (ref 30.0–36.0)
MCV: 90.2 fL (ref 80.0–100.0)
Platelets: 242 10*3/uL (ref 150–400)
RBC: 2.95 MIL/uL — ABNORMAL LOW (ref 4.22–5.81)
RDW: 13.3 % (ref 11.5–15.5)
WBC: 11.4 10*3/uL — ABNORMAL HIGH (ref 4.0–10.5)
nRBC: 0 % (ref 0.0–0.2)

## 2022-04-03 LAB — RAPID URINE DRUG SCREEN, HOSP PERFORMED
Amphetamines: NOT DETECTED
Barbiturates: NOT DETECTED
Benzodiazepines: NOT DETECTED
Cocaine: NOT DETECTED
Opiates: POSITIVE — AB
Tetrahydrocannabinol: NOT DETECTED

## 2022-04-03 LAB — GLUCOSE, CAPILLARY
Glucose-Capillary: 126 mg/dL — ABNORMAL HIGH (ref 70–99)
Glucose-Capillary: 134 mg/dL — ABNORMAL HIGH (ref 70–99)
Glucose-Capillary: 137 mg/dL — ABNORMAL HIGH (ref 70–99)
Glucose-Capillary: 141 mg/dL — ABNORMAL HIGH (ref 70–99)
Glucose-Capillary: 165 mg/dL — ABNORMAL HIGH (ref 70–99)
Glucose-Capillary: 79 mg/dL (ref 70–99)
Glucose-Capillary: 97 mg/dL (ref 70–99)

## 2022-04-03 LAB — VALPROIC ACID LEVEL: Valproic Acid Lvl: 21 ug/mL — ABNORMAL LOW (ref 50.0–100.0)

## 2022-04-03 LAB — BASIC METABOLIC PANEL
Anion gap: 8 (ref 5–15)
BUN: 54 mg/dL — ABNORMAL HIGH (ref 8–23)
CO2: 21 mmol/L — ABNORMAL LOW (ref 22–32)
Calcium: 8.3 mg/dL — ABNORMAL LOW (ref 8.9–10.3)
Chloride: 110 mmol/L (ref 98–111)
Creatinine, Ser: 2.04 mg/dL — ABNORMAL HIGH (ref 0.61–1.24)
GFR, Estimated: 34 mL/min — ABNORMAL LOW (ref >60)
Glucose, Bld: 108 mg/dL — ABNORMAL HIGH (ref 70–99)
Potassium: 5 mmol/L (ref 3.5–5.1)
Sodium: 139 mmol/L (ref 135–145)

## 2022-04-03 LAB — HEPARIN LEVEL (UNFRACTIONATED)
Heparin Unfractionated: <0.1 IU/mL — ABNORMAL LOW (ref 0.30–0.70)
Heparin Unfractionated: <0.1 IU/mL — ABNORMAL LOW (ref 0.30–0.70)
Heparin Unfractionated: 0.22 IU/mL — ABNORMAL LOW (ref 0.30–0.70)

## 2022-04-03 MED ORDER — TAMSULOSIN HCL 0.4 MG PO CAPS
0.4000 mg | ORAL_CAPSULE | Freq: Every day | ORAL | Status: DC
  Administered 2022-04-03 – 2022-04-10 (×8): 0.4 mg via ORAL
  Filled 2022-04-03 (×8): qty 1

## 2022-04-03 MED ORDER — HEPARIN BOLUS VIA INFUSION
4000.0000 [IU] | Freq: Once | INTRAVENOUS | Status: AC
  Administered 2022-04-03: 4000 [IU] via INTRAVENOUS
  Filled 2022-04-03: qty 4000

## 2022-04-03 MED ORDER — FUROSEMIDE 10 MG/ML IJ SOLN
40.0000 mg | Freq: Two times a day (BID) | INTRAMUSCULAR | Status: DC
  Administered 2022-04-03 – 2022-04-05 (×6): 40 mg via INTRAVENOUS
  Filled 2022-04-03 (×6): qty 4

## 2022-04-03 MED ORDER — ROSUVASTATIN CALCIUM 20 MG PO TABS
20.0000 mg | ORAL_TABLET | Freq: Every day | ORAL | Status: DC
  Administered 2022-04-03 – 2022-04-09 (×7): 20 mg via ORAL
  Filled 2022-04-03 (×7): qty 1

## 2022-04-03 MED ORDER — VALPROIC ACID 250 MG/5ML PO SOLN
250.0000 mg | Freq: Two times a day (BID) | ORAL | Status: DC
  Administered 2022-04-03 – 2022-04-05 (×4): 250 mg via ORAL
  Filled 2022-04-03 (×5): qty 5

## 2022-04-03 NOTE — Progress Notes (Addendum)
Progress Note  Patient Name: Dennis [name] Date of Encounter: 04/03/2022  [facility] Cardiologist: Carlyle Dolly, MD   Subjective   Much more awake and alert this morning. Answering questions appropriately. He is oriented to place. Thought it was 09/2020. Denies chest pain currently. Continues to have cough. Feels SOB with laying flat.  Inpatient Medications    Scheduled Meds:  aspirin  81 mg Oral Daily   insulin aspart  0-15 Units Subcutaneous TID WC   insulin aspart  0-5 Units Subcutaneous QHS   insulin glargine-yfgn  8 Units Subcutaneous QHS   metoprolol tartrate  25 mg Oral BID   nicotine  21 mg Transdermal Daily   QUEtiapine  100 mg Oral Daily   rosuvastatin  5 mg Oral Daily   sertraline  75 mg Oral Daily   sodium chloride flush  3 mL Intravenous Q12H   sodium zirconium cyclosilicate  10 g Oral TID   valproic acid  250 mg Oral TID   Continuous Infusions:  sodium chloride     heparin 1,100 Units/hr (04/03/22 0320)   PRN Meds: sodium chloride, acetaminophen, haloperidol lactate, nitroGLYCERIN, ondansetron (ZOFRAN) IV, sodium chloride flush   Vital Signs    Vitals:   04/02/22 2013 04/02/22 2016 04/03/22 0035 04/03/22 0428  BP: 136/75 136/75 119/63 122/64  Pulse: 96 96    Resp:  '20 20 17  '$ Temp:  98 F (36.7 C) 98 F (36.7 C) 98.1 F (36.7 C)  TempSrc:  Oral Axillary Axillary  SpO2:  100% 96% 91%  Weight:      Height:        Intake/Output Summary (Last 24 hours) at 04/03/2022 0826 Last data filed at 04/03/2022 0408 Gross per 24 hour  Intake 807.94 ml  Output 650 ml  Net 157.94 ml      04/02/2022    5:00 PM 02/24/2022    9:40 AM 01/25/2020    5:10 PM  Last 3 Weights  Weight (lbs) 189 lb 6 oz 163 lb 12.8 oz 220 lb  Weight (kg) 85.9 kg 74.299 kg 99.791 kg      Telemetry    Afib (rate controlled), 1 brief run of NSVT - Personally Reviewed  ECG    No new tracing - Personally Reviewed  Physical Exam   GEN: Comfortable, calm, answering  questions appropriately  Neck: JVD to angle of the mandible Cardiac: Irregular, no murmurs  Respiratory: Diminished about 1/3 way up lung fields GI: Soft, nontender, non-distended  MS: Right AKA, left leg with no edema. Luke warm Neuro:  AAOx2 Psych: Calm  Labs    High Sensitivity Troponin:   Recent Labs  Lab 04/02/22 1656 04/02/22 1826  TROPONINIHS 1,335* 1,635*     Chemistry Recent Labs  Lab 04/02/22 1656 04/02/22 2217 04/03/22 0326  NA 138 136 139  K 5.8* 6.0* 5.0  CL 106 103 110  CO2 18* 20* 21*  GLUCOSE 246* 256* 108*  BUN 54* 55* 54*  CREATININE 2.17* 2.14* 2.04*  CALCIUM 8.6* 8.3* 8.3*  MG  --  2.1  --   PROT 6.8  --   --   ALBUMIN 2.9*  --   --   AST 26  --   --   ALT 29  --   --   ALKPHOS 78  --   --   BILITOT 0.7  --   --   GFRNONAA 31* 32* 34*  ANIONGAP '14 13 8    '$ Lipids  Recent Labs  Lab 04/02/22 1656  CHOL 128  TRIG 58  HDL 44  LDLCALC 72  CHOLHDL 2.9    Hematology Recent Labs  Lab 04/02/22 2217 04/03/22 0326  WBC 12.4* 11.4*  RBC 2.99* 2.95*  HGB 9.2* 8.9*  HCT 26.9* 26.6*  MCV 90.0 90.2  MCH 30.8 30.2  MCHC 34.2 33.5  RDW 13.2 13.3  PLT 252 242   Thyroid  Recent Labs  Lab 04/02/22 1656  TSH 5.471*    BNP Recent Labs  Lab 04/02/22 1656  BNP 1,319.0*    DDimer No results for input(s): "DDIMER" in the last 168 hours.   Radiology    DG CHEST PORT 1 VIEW  Result Date: 04/02/2022 CLINICAL DATA:  Chest pain of uncertain etiology EXAM: PORTABLE CHEST 1 VIEW COMPARISON:  06/07/2021 FINDINGS: Single frontal view of the chest demonstrates an enlarged cardiac silhouette. There is congestion of the central pulmonary vasculature, with bilateral perihilar airspace disease. Additionally, there are bilateral pleural effusions, right much larger than left. The right pleural effusion may be partially loculated. No pneumothorax. IMPRESSION: 1. Constellation of findings most consistent with congestive heart failure, though underlying  infection would be difficult to exclude. Radiographic follow-up after appropriate medical management is recommended. Electronically Signed   By: Randa Ngo M.D.   On: 04/02/2022 17:19    Cardiac Studies   TTE 01/2022  1. Overall left ventricular ejection fraction is estimated at 50 to 55%.   2. Low normal global left ventricular systolic function.   3. There is no evidence of pericardial effusion.   Patient Profile     76 y.o. 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   Assessment & Plan    #NSTEMI: #Known CAD with multiple PCIs: #Chest Pain: Per OSH notes, the patient was complaining of left upper chest pain that was worse with cough as well as SOB. There, trop 33>332 which is now 1635, BNP 671 which is now 1319, Cr 1.58> 2.04. CTA negative for PE but notable for moderate pleural effusions and possible pneumonia, INR 6.9. Initial ECG with STD in lateral leads. Last TTE 01/2022 with LVEF 50-55%. Notably, has history of CAD with prior PCI to RCA and OM2 in 2006 and mid-RCA and PDA in 10/2017. Overall, patient merits cath given improvement in mental status and symptoms. Given AKI, will await for renal function to improve. He is also overloaded and merits diuresis in order to lay flat for the procedure.  -Continue heparin gtt -Continue ASA '81mg'$  daily -Increase crestor to '20mg'$  daily -Continue metop '25mg'$  BID; change to long acting prior to discharge -Hold ARB due to AKI -Plan for LHC once renal function and volume status improves  #Acute on Chronic Diastolic HF: Patient presented with cough, SOB and chest pain found to have BNP 1319 on admission here. CXR with bilateral pleural effusions and pulmonary edema consistent with acute on chronic diastolic HF exacerbation. Repeat  TTE pending. Will trial diuresis and monitor response. If renal function worsens, likely related to contrast received with CTA and may need to hold diuretics for a few days until renal function stabilizes before resuming. -Start lasix '40mg'$  IV BID -If renal function continues to rise, likely related to contrast received for CTA and may need to hold diuretics until renal function stabilizes prior to resuming -  Will add GDMT as able once AKI improves -Monitor I/Os and daily weights   #Paroxsymal Afib: Remains in Afib currently. INR supratherapeutic at OSH but warfarin has since been transitioned to heparin gtt given NSTEMI as above. -Continue metop '25mg'$  BID -Continue heparin gtt for now -Was previously on warfarin at home  #AKI on CKD IIIA: Likely secondary to acute on chronic diastolic HF exacerbation (although EF may have dropped with recent event) and contrast administration with CTA. Will trial diuresis and if fails to improve, likely will need to hold to allow for renal function to stabilize following contrast load prior to resuming.  #HLD: LDL 72, HDL 44, TG 58.  -Increase crestor to '20mg'$  daily -Will repeat lipid profile as outpatient  #PAD: With right AKA. -Continue ASA and statin as above  #Dementia: More alert and conversant on exam for me today -Management per primary       For questions or updates, please contact [place] Please consult [email or web address] for contact info under        Signed, Freada Bergeron, MD  04/03/2022, 8:26 AM

## 2022-04-03 NOTE — Progress Notes (Addendum)
[place] for heparin (warfarin on hold) Indication: chest pain/ACS  Allergies  Allergen Reactions   Betadine [Povidone Iodine] Swelling    Facial swelling   Fish Allergy Swelling    Facial swelling. Many years ago.   Iodine Swelling    Facial swelling   Shellfish Allergy Swelling    Facial swelling   Sulfamethoxazole-Trimethoprim Swelling    Facial swelling   Gabapentin Other (See Comments)    Unknown reaction   Chlorhexidine    Trimethoprim Swelling    Facial swelling - Doesn't think he ever took this by itself.   Tramadol Other (See Comments)    Makes him crazy    Patient Measurements: Height: '6\' 1"'$  (185.4 cm) Weight: 85.9 kg (189 lb 6 oz) IBW/kg (Calculated) : 79.9 Heparin Dosing Weight: 85.9 kg  Vital Signs: Temp: 98 F (36.7 C) (07/16 0035) Temp Source: Axillary (07/16 0035) BP: 119/63 (07/16 0035) Pulse Rate: 96 (07/15 2016)  Labs: Recent Labs    04/02/22 1656 04/02/22 1826 04/02/22 2217 04/03/22 0326  HGB  --   --  9.2* 8.9*  HCT  --   --  26.9* 26.6*  PLT  --   --  252 242  LABPROT 20.6*  --   --   --   INR 1.8*  --   --   --   HEPARINUNFRC  --   --   --  <0.10*  CREATININE 2.17*  --  2.14*  --   TROPONINIHS 1,335* 1,635*  --   --     Estimated Creatinine Clearance: 34.2 mL/min (A) (by C-G formula based on SCr of 2.14 mg/dL (H)).   Medical History: Past Medical History:  Diagnosis Date   Arthritis    CAD (coronary artery disease)    a. s/p prior stenting of RCA and OM2 in 2006 b. s/p STEMI in 10/2017 with stenting of mid-RCA and PDA with DES x2   Chronic pain    Diabetes mellitus ([place])    Hyperlipidemia    Hypertension    STEMI (ST elevation myocardial infarction) ([place])    Tobacco use     Medications:  Medications Prior to Admission  Medication Sig Dispense Refill Last Dose   acetaminophen (TYLENOL) 500 MG tablet Take 1,000 mg by mouth every 6 (six) hours as needed for mild pain or headache.       aspirin 81 MG chewable tablet Chew 1 tablet by mouth daily.      digoxin (LANOXIN) 0.125 MG tablet Take 1 tablet by mouth daily.      folic acid (FOLVITE) 1 MG tablet Take 1 tablet by mouth daily.      glipiZIDE (GLUCOTROL) 5 MG tablet Take 1 tablet by mouth daily.      Insulin Glargine Solostar (LANTUS) 100 UNIT/ML Solostar Pen Inject 8 Units into the skin at bedtime.      lisinopril (ZESTRIL) 2.5 MG tablet Take 2.5 mg by mouth daily.      metoprolol tartrate (LOPRESSOR) 25 MG tablet Take 1 tablet (25 mg total) by mouth 2 (two) times daily. 60 tablet 0    nicotine (NICODERM CQ - DOSED IN MG/24 HOURS) 21 mg/24hr patch Place 21 mg onto the skin daily.      nitroGLYCERIN (NITROSTAT) 0.4 MG SL tablet Place 1 tablet (0.4 mg total) under the tongue every 5 (five) minutes x 3 doses as needed for chest pain. (Patient not taking: Reported on 02/24/2022) 25 tablet 2  QUEtiapine (SEROQUEL) 100 MG tablet Take 1 tablet by mouth daily.      rosuvastatin (CRESTOR) 5 MG tablet Take 5 mg by mouth daily.      sertraline (ZOLOFT) 25 MG tablet Take 3 tablets by mouth daily.      silver sulfADIAZINE (SILVADENE) 1 % cream Apply 1 application topically daily.      valproic acid (DEPAKENE) 250 MG/5ML solution Take 5 mLs by mouth in the morning, at noon, and at bedtime.      vitamin B-12 1000 MCG tablet Take 1 tablet (1,000 mcg total) by mouth daily.      warfarin (COUMADIN) 5 MG tablet Take 2.5 mg by mouth at bedtime.      Scheduled:   aspirin  81 mg Oral Daily   insulin aspart  0-15 Units Subcutaneous TID WC   insulin aspart  0-5 Units Subcutaneous QHS   insulin glargine-yfgn  8 Units Subcutaneous QHS   metoprolol tartrate  25 mg Oral BID   nicotine  21 mg Transdermal Daily   QUEtiapine  100 mg Oral Daily   rosuvastatin  5 mg Oral Daily   sertraline  75 mg Oral Daily   sodium chloride flush  3 mL Intravenous Q12H   sodium zirconium cyclosilicate  10 g Oral TID   valproic acid  250 mg Oral TID    Infusions:   sodium chloride     heparin 1,100 Units/hr (04/03/22 0320)    Assessment: 76 y.o M patient presented with chest pain to outside facility [facility] and transferred to [facility]. 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  Chest pain is worse when doing things and has gotten worse today (7/15) especially with coughing. Denied improvement with SL NTG, but RN reported improvement in chest pain with SL NTG from 10--> 3.  ER course at [facility]: According to chart, patient received '5mg'$  of Vit. K for INR 6.9 with no s/sx of bleeding. CT Angio chest negative for PE but showed multifocal pneumonia and right loculated pleural effusion. Hypertensive with flash pulmonary edema on CXR.  HS troponin 15 -> 63.  No evidence of acute ischemia on 12 lead EKG.  Pharmacy consulted to dose heparin. Patient has no s/sx of bleeding according to the nurse, INR came back at 1.8, no other labs at this time.  7/16 AM update:  Heparin level low  Goal of Therapy:  Heparin level 0.3-0.7 units/ml Monitor platelets by anticoagulation protocol: Yes   Plan:  Inc heparin to 1300 units/hr 1300 heparin level  Narda Bonds, PharmD, BCPS Clinical Pharmacist Phone: [phone]

## 2022-04-03 NOTE — Progress Notes (Signed)
Notified Dr. Eliseo Squires that patients heart rate has been maintaining at 100-120's. Patient is not symptomatic when resting in bed with higher heart rates.  This morning when OOB to [facility] he had chest pain correlating with his breathing and it resolved with rest.  1900 No further chest pain during shift.

## 2022-04-03 NOTE — Progress Notes (Signed)
PROGRESS NOTE    Dennis [name]  RCV:[id number] DOB: 1946/05/27 DOA: 04/02/2022 PCP: Glenda Chroman, MD    Brief Narrative:  Dennis [name] is a 77 y.o. 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  He reports chest pain started 2 days ago.  It may have been exertional.  It comes and goes.  Better with rest.  Worse with doing thing and at nights.   Pain is substernal. He quit smoking about 2 weeks ago.     Assessment and Plan: NSTEMI -Patient with substernal chest pain that came on acutely with exertion (this is questionable) and improved with rest/NTG. -3/3 typical symptoms suggestive of typical angina. -CTA at [facility] reportedly negative for PE but with multilobar PNA and R loculated pleural effusion; will repeat CXR here but he does not have clinical evidence of PNA and so will not continue antibiotics currently.   -Initial HS troponin 15 ->63; will repeat.   -EKG with apparent lateral ST depression at, no apparent STEMI, improved on EKG here. -Will start heparin per pharmacy since INR < 2 -Patient appears likely to need invasive evaluation (cardiac catheterization) based on concerning history, pertinent symptoms, elevated troponin - plan for cath once renal function stable and can lay flat after diuresis -Continue ASA 81 mg daily -Continue Lopressor -IV lasix   HTN -Continue lopressor -Hold lisinopril    HLD -Continue Crestor LDL 72   DM -A1C: 5.4-- improved from prior -Hold glipizide -Continue Lantus qhs -SSI   Afib -On Digoxin and Lopressor for rate control; will hold Dig and continue Lopressor -He is on Coumadin with reported INR 6.9; rechecking INR and holding Coumadin with plan to start heparin when appropriate per pharmacy   Dementia/Mood d/o - delirium precautions -Continue Seroquel, Sertraline,  Depakote   PVD -s/p R AKA, well healed   Stage 3a CKD -Slightly worse than baseline -Hold lisinopril -monitor with diuresis   Tobacco dependence -Reports quitting 2 weeks ago, continue to encourage cessation.   -Patch ordered    Sacral pressure ulcer -RN noted stage 2 sacral pressure ulcer, present on admission -Would request wound care consult -photo in chart   DNR -per Dr. Lorin Mercy   Acute urinary retention -continue foley -voiding trial when appropriate         DVT prophylaxis: heparin gtt    Code Status: DNR   Disposition Plan:  Level of care: Progressive Status is: Inpatient Remains inpatient appropriate because: needs cath    Consultants:  cards   Subjective: No current chest pain, coughs when laying flat  Objective: Vitals:   04/02/22 2013 04/02/22 2016 04/03/22 0035 04/03/22 0428  BP: 136/75 136/75 119/63 122/64  Pulse: 96 96    Resp:  '20 20 17  '$ Temp:  98 F (36.7 C) 98 F (36.7 C) 98.1 F (36.7 C)  TempSrc:  Oral Axillary Axillary  SpO2:  100% 96% 91%  Weight:      Height:        Intake/Output Summary (Last 24 hours) at 04/03/2022 1025 Last data filed at 04/03/2022 0408 Gross per 24 hour  Intake 807.94 ml  Output 650 ml  Net 157.94 ml   Filed Weights   04/02/22 1700  Weight: 85.9 kg    Examination:   General: Appearance:    Well developed, well nourished male in no acute  distress     Lungs:     Crackles at bases, respirations unlabored  Heart:    Normal heart rate.    MS:   Right AKA   Neurologic:   Awake, alert       Data Reviewed: I have personally reviewed following labs and imaging studies  CBC: Recent Labs  Lab 04/02/22 2217 04/03/22 0326  WBC 12.4* 11.4*  HGB 9.2* 8.9*  HCT 26.9* 26.6*  MCV 90.0 90.2  PLT 252 654   Basic Metabolic Panel: Recent Labs  Lab 04/02/22 1656 04/02/22 2217 04/03/22 0326  NA 138 136 139  K 5.8* 6.0* 5.0  CL 106 103 110  CO2 18* 20* 21*  GLUCOSE 246* 256* 108*  BUN  54* 55* 54*  CREATININE 2.17* 2.14* 2.04*  CALCIUM 8.6* 8.3* 8.3*  MG  --  2.1  --   PHOS  --  4.1  --    GFR: Estimated Creatinine Clearance: 35.9 mL/min (A) (by C-G formula based on SCr of 2.04 mg/dL (H)). Liver Function Tests: Recent Labs  Lab 04/02/22 1656  AST 26  ALT 29  ALKPHOS 78  BILITOT 0.7  PROT 6.8  ALBUMIN 2.9*   No results for input(s): "LIPASE", "AMYLASE" in the last 168 hours. No results for input(s): "AMMONIA" in the last 168 hours. Coagulation Profile: Recent Labs  Lab 04/02/22 1656  INR 1.8*   Cardiac Enzymes: No results for input(s): "CKTOTAL", "CKMB", "CKMBINDEX", "TROPONINI" in the last 168 hours. BNP (last 3 results) No results for input(s): "PROBNP" in the last 8760 hours. HbA1C: Recent Labs    04/02/22 1656  HGBA1C 5.4   CBG: Recent Labs  Lab 04/02/22 1528 04/02/22 2018 04/03/22 0031 04/03/22 0448 04/03/22 0830  GLUCAP 246* 242* 137* 126* 141*   Lipid Profile: Recent Labs    04/02/22 1656  CHOL 128  HDL 44  LDLCALC 72  TRIG 58  CHOLHDL 2.9   Thyroid Function Tests: Recent Labs    04/02/22 1656  TSH 5.471*   Anemia Panel: No results for input(s): "VITAMINB12", "FOLATE", "FERRITIN", "TIBC", "IRON", "RETICCTPCT" in the last 72 hours. Sepsis Labs: No results for input(s): "PROCALCITON", "LATICACIDVEN" in the last 168 hours.  No results found for this or any previous visit (from the past 240 hour(s)).       Radiology Studies: DG CHEST PORT 1 VIEW  Result Date: 04/02/2022 CLINICAL DATA:  Chest pain of uncertain etiology EXAM: PORTABLE CHEST 1 VIEW COMPARISON:  06/07/2021 FINDINGS: Single frontal view of the chest demonstrates an enlarged cardiac silhouette. There is congestion of the central pulmonary vasculature, with bilateral perihilar airspace disease. Additionally, there are bilateral pleural effusions, right much larger than left. The right pleural effusion may be partially loculated. No pneumothorax. IMPRESSION: 1.  Constellation of findings most consistent with congestive heart failure, though underlying infection would be difficult to exclude. Radiographic follow-up after appropriate medical management is recommended. Electronically Signed   By: Randa Ngo M.D.   On: 04/02/2022 17:19        Scheduled Meds:  aspirin  81 mg Oral Daily   furosemide  40 mg Intravenous BID   insulin aspart  0-15 Units Subcutaneous TID WC   insulin aspart  0-5 Units Subcutaneous QHS   insulin glargine-yfgn  8 Units Subcutaneous QHS   metoprolol tartrate  25 mg Oral BID   nicotine  21 mg Transdermal Daily   QUEtiapine  100 mg Oral Daily   rosuvastatin  20 mg Oral Daily  sertraline  75 mg Oral Daily   sodium chloride flush  3 mL Intravenous Q12H   sodium zirconium cyclosilicate  10 g Oral TID   valproic acid  250 mg Oral TID   Continuous Infusions:  sodium chloride     heparin 1,100 Units/hr (04/03/22 0320)     LOS: 1 day    Time spent: 45 minutes spent on chart review, discussion with nursing staff, consultants, updating family and interview/physical exam; more than 50% of that time was spent in counseling and/or coordination of care.    Geradine Girt, DO [facility] Available via Epic secure chat 7am-7pm After these hours, please refer to coverage provider listed on [email or web address] 04/03/2022, 10:25 AM

## 2022-04-03 NOTE — Progress Notes (Signed)
Pt has a Urethral Catheter 16 Fr placed at previous facility, CHG baths contraindicated due to allergy listed.

## 2022-04-03 NOTE — Progress Notes (Signed)
ANTICOAGULATION CONSULT NOTE - Follow Up  Pharmacy Consult for heparin (warfarin on hold) Indication: chest pain/ACS  Allergies  Allergen Reactions   Betadine [Povidone Iodine] Swelling    Facial swelling   Fish Allergy Swelling    Facial swelling. Many years ago.   Iodine Swelling    Facial swelling   Shellfish Allergy Swelling    Facial swelling   Sulfamethoxazole-Trimethoprim Swelling    Facial swelling   Gabapentin Other (See Comments)    Unknown reaction   Chlorhexidine    Trimethoprim Swelling    Facial swelling - Doesn't think he ever took this by itself.   Tramadol Other (See Comments)    Makes him crazy    Patient Measurements: Height: '6\' 1"'$  (185.4 cm) Weight: 85.9 kg (189 lb 6 oz) IBW/kg (Calculated) : 79.9 Heparin Dosing Weight: 85.9 kg  Vital Signs: Temp: 98.2 F (36.8 C) (07/16 2045) Temp Source: Oral (07/16 2045) BP: 108/64 (07/16 2045) Pulse Rate: 120 (07/16 2045)  Labs: Recent Labs    04/02/22 1656 04/02/22 1826 04/02/22 2217 04/03/22 0326 04/03/22 1246 04/03/22 2027  HGB  --   --  9.2* 8.9*  --   --   HCT  --   --  26.9* 26.6*  --   --   PLT  --   --  252 242  --   --   LABPROT 20.6*  --   --   --   --   --   INR 1.8*  --   --   --   --   --   HEPARINUNFRC  --   --   --  <0.10* <0.10* 0.22*  CREATININE 2.17*  --  2.14* 2.04*  --   --   TROPONINIHS 1,335* 1,635*  --   --   --   --      Estimated Creatinine Clearance: 35.9 mL/min (A) (by C-G formula based on SCr of 2.04 mg/dL (H)).   Medical History: Past Medical History:  Diagnosis Date   Arthritis    CAD (coronary artery disease)    a. s/p prior stenting of RCA and OM2 in 2006 b. s/p STEMI in 10/2017 with stenting of mid-RCA and PDA with DES x2   Chronic pain    Diabetes mellitus ([place])    Hyperlipidemia    Hypertension    STEMI (ST elevation myocardial infarction) ([place])    Tobacco use     Medications:  Medications Prior to Admission  Medication Sig Dispense Refill Last  Dose   acetaminophen (TYLENOL) 500 MG tablet Take 1,000 mg by mouth every 6 (six) hours as needed for mild pain or headache.      aspirin 81 MG chewable tablet Chew 1 tablet by mouth daily.      digoxin (LANOXIN) 0.125 MG tablet Take 1 tablet by mouth daily.      divalproex (DEPAKOTE) 250 MG DR tablet Take 250 mg by mouth 2 (two) times daily.      folic acid (FOLVITE) 1 MG tablet Take 1 tablet by mouth daily.      glipiZIDE (GLUCOTROL) 5 MG tablet Take 1 tablet by mouth daily.      insulin aspart (NOVOLOG FLEXPEN) 100 UNIT/ML FlexPen Inject into the skin 3 (three) times daily with meals.      insulin degludec (TRESIBA FLEXTOUCH) 200 UNIT/ML FlexTouch Pen Inject into the skin.      Insulin Glargine Solostar (LANTUS) 100 UNIT/ML Solostar Pen Inject 8 Units into the skin at  bedtime.      LINZESS 290 MCG CAPS capsule Take 290 mcg by mouth daily.      lisinopril (ZESTRIL) 2.5 MG tablet Take 2.5 mg by mouth daily.      lisinopril (ZESTRIL) 5 MG tablet Take 5 mg by mouth daily.      metoprolol tartrate (LOPRESSOR) 25 MG tablet Take 1 tablet (25 mg total) by mouth 2 (two) times daily. 60 tablet 0    nicotine (NICODERM CQ - DOSED IN MG/24 HOURS) 21 mg/24hr patch Place 21 mg onto the skin daily.      nitroGLYCERIN (NITROSTAT) 0.4 MG SL tablet Place 1 tablet (0.4 mg total) under the tongue every 5 (five) minutes x 3 doses as needed for chest pain. (Patient not taking: Reported on 02/24/2022) 25 tablet 2    QUEtiapine (SEROQUEL) 100 MG tablet Take 1 tablet by mouth daily.      rosuvastatin (CRESTOR) 5 MG tablet Take 5 mg by mouth daily.      sertraline (ZOLOFT) 100 MG tablet Take 100 mg by mouth daily.      sertraline (ZOLOFT) 25 MG tablet Take 3 tablets by mouth daily.      silver sulfADIAZINE (SILVADENE) 1 % cream Apply 1 application topically daily.      tamsulosin (FLOMAX) 0.4 MG CAPS capsule Take 0.4 mg by mouth daily.      valproic acid (DEPAKENE) 250 MG/5ML solution Take 5 mLs by mouth in the morning,  at noon, and at bedtime.      vitamin B-12 1000 MCG tablet Take 1 tablet (1,000 mcg total) by mouth daily.      warfarin (COUMADIN) 2.5 MG tablet Take 2.5 mg by mouth daily.      warfarin (COUMADIN) 5 MG tablet Take 2.5 mg by mouth at bedtime.      Scheduled:   aspirin  81 mg Oral Daily   furosemide  40 mg Intravenous BID   insulin aspart  0-15 Units Subcutaneous TID WC   insulin aspart  0-5 Units Subcutaneous QHS   insulin glargine-yfgn  8 Units Subcutaneous QHS   metoprolol tartrate  25 mg Oral BID   nicotine  21 mg Transdermal Daily   QUEtiapine  100 mg Oral Daily   rosuvastatin  20 mg Oral Daily   sertraline  75 mg Oral Daily   sodium chloride flush  3 mL Intravenous Q12H   tamsulosin  0.4 mg Oral Daily   valproic acid  250 mg Oral BID   Infusions:   sodium chloride     heparin 1,500 Units/hr (04/03/22 1600)    Assessment: 76 y.o M patient presented with chest pain to outside facility [facility] and transferred to [facility]. 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   Received 5 mg of vit K for INR 6.9 at [facility] w no s/sx bleed. CT angio negative for PE. Pharmacy consulted to dose heparin. No initial heparin bolus dose given d/t INR 6.9 at the time. Most recent INR 1.8 from 7/15.   7/16: heparin level 0.22 subtherapeutic on 1500 units/hr. Hgb 8.9, plts WNL. Patient seen and has no s/sx of bleeding. Upon talking with nursing, there was a potential pause in heparin drip during prior shift for unclear amount of time.   Goal of Therapy:  Heparin level 0.3-0.7 units/ml Monitor platelets by anticoagulation protocol: Yes   Plan:  Continue heparin to 1500 units/hr  Obtain heparin level in 6 hours and daily heparin and CBC   Monitor H&H and for s/sx bleeding  F/u left heart cath  Sandford Craze, PharmD. [facility] Acute Care PGY-1 04/03/2022  9:31 PM

## 2022-04-03 NOTE — Consult Note (Signed)
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 See photo uploaded to EHR by Admitting MD. Wound type:Pressure plus moisture Pressure Injury POA: Yes Measurement:3.2 x 1cm with wound bed partially obscured by the presence of yellow slough, remainder of wound bed measures depth of 0.1cm Wound GYF:VCBSWH and pink Drainage (amount, consistency, odor) small light yellow Periwound: intact with evidence of previous wound healing Dressing procedure/placement/frequency: I have provided a conservative POC aimed at moisture mitigation and pressure redistribution using a soap and water cleanse followed by a rinse and dry. Topical care will be with a size appropriate piece of silver hydrofiber topped with a dry gauze 2x2 and topped with a silicone foam. The foam is to be placed so that the "tip" is oriented away from the anus.  Turning and repositioning is in place and will be further specified to minimize time in the supine position. A pressure redistribution chair pad is to be used when OOB in the chair and may be sent home with the patient.  [place] nursing team will not follow, but will remain available to this patient, the nursing and medical teams.  Please re-consult if needed.  Thank you for inviting [place] to participate in this patient's Plan of Care.  Maudie Flakes, MSN, RN, CNS, [place], Serita Grammes, [employer], [employer] phone:  [phone]

## 2022-04-03 NOTE — Progress Notes (Addendum)
ANTICOAGULATION CONSULT NOTE - Follow Up  Pharmacy Consult for heparin (warfarin on hold) Indication: chest pain/ACS  Allergies  Allergen Reactions   Betadine [Povidone Iodine] Swelling    Facial swelling   Fish Allergy Swelling    Facial swelling. Many years ago.   Iodine Swelling    Facial swelling   Shellfish Allergy Swelling    Facial swelling   Sulfamethoxazole-Trimethoprim Swelling    Facial swelling   Gabapentin Other (See Comments)    Unknown reaction   Chlorhexidine    Trimethoprim Swelling    Facial swelling - Doesn't think he ever took this by itself.   Tramadol Other (See Comments)    Makes him crazy    Patient Measurements: Height: '6\' 1"'$  (185.4 cm) Weight: 85.9 kg (189 lb 6 oz) IBW/kg (Calculated) : 79.9 Heparin Dosing Weight: 85.9 kg  Vital Signs: Temp: 98.1 F (36.7 C) (07/16 0428) Temp Source: Axillary (07/16 0428) BP: 111/70 (07/16 1126)  Labs: Recent Labs    04/02/22 1656 04/02/22 1826 04/02/22 2217 04/03/22 0326 04/03/22 1246  HGB  --   --  9.2* 8.9*  --   HCT  --   --  26.9* 26.6*  --   PLT  --   --  252 242  --   LABPROT 20.6*  --   --   --   --   INR 1.8*  --   --   --   --   HEPARINUNFRC  --   --   --  <0.10* <0.10*  CREATININE 2.17*  --  2.14* 2.04*  --   TROPONINIHS 1,335* 1,635*  --   --   --      Estimated Creatinine Clearance: 35.9 mL/min (A) (by C-G formula based on SCr of 2.04 mg/dL (H)).   Medical History: Past Medical History:  Diagnosis Date   Arthritis    CAD (coronary artery disease)    a. s/p prior stenting of RCA and OM2 in 2006 b. s/p STEMI in 10/2017 with stenting of mid-RCA and PDA with DES x2   Chronic pain    Diabetes mellitus ([place])    Hyperlipidemia    Hypertension    STEMI (ST elevation myocardial infarction) ([place])    Tobacco use     Medications:  Medications Prior to Admission  Medication Sig Dispense Refill Last Dose   acetaminophen (TYLENOL) 500 MG tablet Take 1,000 mg by mouth every 6 (six)  hours as needed for mild pain or headache.      aspirin 81 MG chewable tablet Chew 1 tablet by mouth daily.      digoxin (LANOXIN) 0.125 MG tablet Take 1 tablet by mouth daily.      divalproex (DEPAKOTE) 250 MG DR tablet Take 250 mg by mouth 2 (two) times daily.      folic acid (FOLVITE) 1 MG tablet Take 1 tablet by mouth daily.      glipiZIDE (GLUCOTROL) 5 MG tablet Take 1 tablet by mouth daily.      insulin aspart (NOVOLOG FLEXPEN) 100 UNIT/ML FlexPen Inject into the skin 3 (three) times daily with meals.      insulin degludec (TRESIBA FLEXTOUCH) 200 UNIT/ML FlexTouch Pen Inject into the skin.      Insulin Glargine Solostar (LANTUS) 100 UNIT/ML Solostar Pen Inject 8 Units into the skin at bedtime.      LINZESS 290 MCG CAPS capsule Take 290 mcg by mouth daily.      lisinopril (ZESTRIL) 2.5 MG tablet Take 2.5  mg by mouth daily.      lisinopril (ZESTRIL) 5 MG tablet Take 5 mg by mouth daily.      metoprolol tartrate (LOPRESSOR) 25 MG tablet Take 1 tablet (25 mg total) by mouth 2 (two) times daily. 60 tablet 0    nicotine (NICODERM CQ - DOSED IN MG/24 HOURS) 21 mg/24hr patch Place 21 mg onto the skin daily.      nitroGLYCERIN (NITROSTAT) 0.4 MG SL tablet Place 1 tablet (0.4 mg total) under the tongue every 5 (five) minutes x 3 doses as needed for chest pain. (Patient not taking: Reported on 02/24/2022) 25 tablet 2    QUEtiapine (SEROQUEL) 100 MG tablet Take 1 tablet by mouth daily.      rosuvastatin (CRESTOR) 5 MG tablet Take 5 mg by mouth daily.      sertraline (ZOLOFT) 100 MG tablet Take 100 mg by mouth daily.      sertraline (ZOLOFT) 25 MG tablet Take 3 tablets by mouth daily.      silver sulfADIAZINE (SILVADENE) 1 % cream Apply 1 application topically daily.      tamsulosin (FLOMAX) 0.4 MG CAPS capsule Take 0.4 mg by mouth daily.      valproic acid (DEPAKENE) 250 MG/5ML solution Take 5 mLs by mouth in the morning, at noon, and at bedtime.      vitamin B-12 1000 MCG tablet Take 1 tablet (1,000  mcg total) by mouth daily.      warfarin (COUMADIN) 2.5 MG tablet Take 2.5 mg by mouth daily.      warfarin (COUMADIN) 5 MG tablet Take 2.5 mg by mouth at bedtime.      Scheduled:   aspirin  81 mg Oral Daily   furosemide  40 mg Intravenous BID   insulin aspart  0-15 Units Subcutaneous TID WC   insulin aspart  0-5 Units Subcutaneous QHS   insulin glargine-yfgn  8 Units Subcutaneous QHS   metoprolol tartrate  25 mg Oral BID   nicotine  21 mg Transdermal Daily   QUEtiapine  100 mg Oral Daily   rosuvastatin  20 mg Oral Daily   sertraline  75 mg Oral Daily   sodium chloride flush  3 mL Intravenous Q12H   sodium zirconium cyclosilicate  10 g Oral TID   tamsulosin  0.4 mg Oral Daily   valproic acid  250 mg Oral TID   Infusions:   sodium chloride     heparin 1,100 Units/hr (04/03/22 0320)    Assessment: 76 y.o M patient presented with chest pain to outside facility [facility] and transferred to [facility]. 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   Received 5 mg of vit K for INR 6.9 at [facility] w no s/sx bleed. CT angio negative for PE. Pharmacy consulted to dose heparin. No initial heparin bolus dose given d/t INR 6.9 at the time. Most recent INR 1.8 from 7/15.   Heparin level < 0.10 subtherapeutic on 1300 units/hr. Given INR is below therapeutic range, it is appropriate to initiate bolus dose to achieve steady state. Patient seen and has no s/sx of bleeding. No issues seen with heparin line given at bedside.   Goal of Therapy:  Heparin level 0.3-0.7 units/ml Monitor platelets by anticoagulation protocol: Yes   Plan:  Heparin 4000 unit bolus dose  Inc heparin to 1500 units/hr Obtain heparin level in 6  hours and daily heparin and CBC   Monitor H&H and for s/sx bleeding  F/u left heart cath  Gena Fray, PharmD PGY1 Pharmacy Resident    04/03/2022 2:57 PM

## 2022-04-04 ENCOUNTER — Other Ambulatory Visit: Payer: Self-pay

## 2022-04-04 ENCOUNTER — Inpatient Hospital Stay (HOSPITAL_COMMUNITY): Payer: Medicare HMO

## 2022-04-04 DIAGNOSIS — R079 Chest pain, unspecified: Secondary | ICD-10-CM | POA: Diagnosis not present

## 2022-04-04 DIAGNOSIS — I249 Acute ischemic heart disease, unspecified: Secondary | ICD-10-CM | POA: Diagnosis not present

## 2022-04-04 DIAGNOSIS — I480 Paroxysmal atrial fibrillation: Secondary | ICD-10-CM | POA: Diagnosis not present

## 2022-04-04 DIAGNOSIS — I214 Non-ST elevation (NSTEMI) myocardial infarction: Secondary | ICD-10-CM | POA: Diagnosis not present

## 2022-04-04 LAB — BASIC METABOLIC PANEL
Anion gap: 7 (ref 5–15)
BUN: 50 mg/dL — ABNORMAL HIGH (ref 8–23)
CO2: 27 mmol/L (ref 22–32)
Calcium: 8.2 mg/dL — ABNORMAL LOW (ref 8.9–10.3)
Chloride: 105 mmol/L (ref 98–111)
Creatinine, Ser: 1.92 mg/dL — ABNORMAL HIGH (ref 0.61–1.24)
GFR, Estimated: 36 mL/min — ABNORMAL LOW (ref >60)
Glucose, Bld: 118 mg/dL — ABNORMAL HIGH (ref 70–99)
Potassium: 3.9 mmol/L (ref 3.5–5.1)
Sodium: 139 mmol/L (ref 135–145)

## 2022-04-04 LAB — GLUCOSE, CAPILLARY
Glucose-Capillary: 124 mg/dL — ABNORMAL HIGH (ref 70–99)
Glucose-Capillary: 148 mg/dL — ABNORMAL HIGH (ref 70–99)
Glucose-Capillary: 112 mg/dL — ABNORMAL HIGH (ref 70–99)
Glucose-Capillary: 135 mg/dL — ABNORMAL HIGH (ref 70–99)
Glucose-Capillary: 156 mg/dL — ABNORMAL HIGH (ref 70–99)

## 2022-04-04 LAB — PROTIME-INR
INR: 1.5 — ABNORMAL HIGH (ref 0.8–1.2)
Prothrombin Time: 17.9 seconds — ABNORMAL HIGH (ref 11.4–15.2)

## 2022-04-04 LAB — CBC
HCT: 28.4 % — ABNORMAL LOW (ref 39.0–52.0)
Hemoglobin: 9.3 g/dL — ABNORMAL LOW (ref 13.0–17.0)
MCH: 30.1 pg (ref 26.0–34.0)
MCHC: 32.7 g/dL (ref 30.0–36.0)
MCV: 91.9 fL (ref 80.0–100.0)
Platelets: 269 10*3/uL (ref 150–400)
RBC: 3.09 MIL/uL — ABNORMAL LOW (ref 4.22–5.81)
RDW: 13.3 % (ref 11.5–15.5)
WBC: 10.7 10*3/uL — ABNORMAL HIGH (ref 4.0–10.5)
nRBC: 0 % (ref 0.0–0.2)

## 2022-04-04 LAB — ECHOCARDIOGRAM COMPLETE
AR max vel: 1.98 cm2
AV Area VTI: 1.85 cm2
AV Area mean vel: 1.93 cm2
AV Mean grad: 4 mmHg
AV Peak grad: 7.2 mmHg
Ao pk vel: 1.34 m/s
Area-P 1/2: 3.65 cm2
Height: 73 in
S' Lateral: 3.7 cm
Weight: 3030 oz

## 2022-04-04 LAB — HEPARIN LEVEL (UNFRACTIONATED)
Heparin Unfractionated: 0.12 IU/mL — ABNORMAL LOW (ref 0.30–0.70)
Heparin Unfractionated: 0.19 IU/mL — ABNORMAL LOW (ref 0.30–0.70)

## 2022-04-04 LAB — LIPOPROTEIN A (LPA): Lipoprotein (a): 373.7 nmol/L — ABNORMAL HIGH (ref <75.0)

## 2022-04-04 MED ORDER — WARFARIN SODIUM 5 MG PO TABS
5.0000 mg | ORAL_TABLET | Freq: Once | ORAL | Status: AC
Start: 2022-04-04 — End: 2022-04-04
  Administered 2022-04-04: 5 mg via ORAL
  Filled 2022-04-04: qty 1

## 2022-04-04 MED ORDER — AMIODARONE HCL IN DEXTROSE 360-4.14 MG/200ML-% IV SOLN
60.0000 mg/h | INTRAVENOUS | Status: DC
  Administered 2022-04-04 (×2): 60 mg/h via INTRAVENOUS
  Filled 2022-04-04 (×2): qty 200

## 2022-04-04 MED ORDER — AMIODARONE LOAD VIA INFUSION
150.0000 mg | Freq: Once | INTRAVENOUS | Status: AC
  Administered 2022-04-04: 150 mg via INTRAVENOUS
  Filled 2022-04-04: qty 83.34

## 2022-04-04 MED ORDER — WARFARIN - PHARMACIST DOSING INPATIENT: Freq: Every day | Status: DC

## 2022-04-04 MED ORDER — CLOPIDOGREL BISULFATE 75 MG PO TABS
75.0000 mg | ORAL_TABLET | Freq: Every day | ORAL | Status: DC
  Administered 2022-04-04 – 2022-04-10 (×7): 75 mg via ORAL
  Filled 2022-04-04 (×7): qty 1

## 2022-04-04 MED ORDER — AMIODARONE HCL IN DEXTROSE 360-4.14 MG/200ML-% IV SOLN
30.0000 mg/h | INTRAVENOUS | Status: DC
  Administered 2022-04-04 – 2022-04-07 (×8): 30 mg/h via INTRAVENOUS
  Filled 2022-04-04 (×7): qty 200

## 2022-04-04 NOTE — Progress Notes (Addendum)
Progress Note  Patient Name: Dennis [name] Date of Encounter: 04/04/2022  Primary Cardiologist: Carlyle Dolly, MD  Subjective   Poor historian. "I feel halfway there." Halfway between good/bad. Thinks he's at the eye doctor in the [facility]. Denies CP or SOB.  Inpatient Medications    Scheduled Meds:  aspirin  81 mg Oral Daily   furosemide  40 mg Intravenous BID   insulin aspart  0-15 Units Subcutaneous TID WC   insulin aspart  0-5 Units Subcutaneous QHS   insulin glargine-yfgn  8 Units Subcutaneous QHS   metoprolol tartrate  25 mg Oral BID   nicotine  21 mg Transdermal Daily   QUEtiapine  100 mg Oral Daily   rosuvastatin  20 mg Oral Daily   sertraline  75 mg Oral Daily   sodium chloride flush  3 mL Intravenous Q12H   tamsulosin  0.4 mg Oral Daily   valproic acid  250 mg Oral BID   Continuous Infusions:  sodium chloride     heparin 1,700 Units/hr (04/04/22 0525)   PRN Meds: sodium chloride, acetaminophen, haloperidol lactate, nitroGLYCERIN, ondansetron (ZOFRAN) IV, sodium chloride flush   Vital Signs    Vitals:   04/03/22 2045 04/03/22 2351 04/04/22 0405 04/04/22 0758  BP: 108/64 95/65 113/65 110/74  Pulse: (!) 120 (!) 115 (!) 114 (!) 114  Resp: '19 19 17 15  '$ Temp: 98.2 F (36.8 C) 98.2 F (36.8 C) 98.2 F (36.8 C) 97.8 F (36.6 C)  TempSrc: Oral Oral Oral Oral  SpO2: 98% 99% 99%   Weight:      Height:        Intake/Output Summary (Last 24 hours) at 04/04/2022 0841 Last data filed at 04/04/2022 0421 Gross per 24 hour  Intake 503.34 ml  Output 1825 ml  Net -1321.66 ml      04/02/2022    5:00 PM 02/24/2022    9:40 AM 01/25/2020    5:10 PM  Last 3 Weights  Weight (lbs) 189 lb 6 oz 163 lb 12.8 oz 220 lb  Weight (kg) 85.9 kg 74.299 kg 99.791 kg     Telemetry    Atrial fib rates 110-120s - Personally Reviewed  Physical Exam   GEN: No acute distress.  HEENT: Normocephalic, atraumatic, sclera non-icteric. Neck: No JVD or bruits. Cardiac: Irregularly  irregular, no murmurs, rubs, or gallops.  Respiratory: Coarse to auscultation bilaterally anteriorly. Breathing is unlabored. GI: Soft, nontender, non-distended, BS +x 4. MS: no deformity. Extremities: No clubbing or cyanosis. No edema. Distal pedal pulses are 2+ and equal bilaterally. S/p R AKA Neuro:  A+O to self only. Follows commands. Psych:  Calm affect, poor historian  Labs    High Sensitivity Troponin:   Recent Labs  Lab 04/02/22 1656 04/02/22 1826  TROPONINIHS 1,335* 1,635*      Cardiac EnzymesNo results for input(s): "TROPONINI" in the last 168 hours. No results for input(s): "TROPIPOC" in the last 168 hours.   Chemistry Recent Labs  Lab 04/02/22 1656 04/02/22 2217 04/03/22 0326 04/04/22 0346  NA 138 136 139 139  K 5.8* 6.0* 5.0 3.9  CL 106 103 110 105  CO2 18* 20* 21* 27  GLUCOSE 246* 256* 108* 118*  BUN 54* 55* 54* 50*  CREATININE 2.17* 2.14* 2.04* 1.92*  CALCIUM 8.6* 8.3* 8.3* 8.2*  PROT 6.8  --   --   --   ALBUMIN 2.9*  --   --   --   AST 26  --   --   --  ALT 29  --   --   --   ALKPHOS 78  --   --   --   BILITOT 0.7  --   --   --   GFRNONAA 31* 32* 34* 36*  ANIONGAP '14 13 8 7     '$ Hematology Recent Labs  Lab 04/02/22 2217 04/03/22 0326 04/04/22 0346  WBC 12.4* 11.4* 10.7*  RBC 2.99* 2.95* 3.09*  HGB 9.2* 8.9* 9.3*  HCT 26.9* 26.6* 28.4*  MCV 90.0 90.2 91.9  MCH 30.8 30.2 30.1  MCHC 34.2 33.5 32.7  RDW 13.2 13.3 13.3  PLT 252 242 269    BNP Recent Labs  Lab 04/02/22 1656  BNP 1,319.0*     DDimer No results for input(s): "DDIMER" in the last 168 hours.   Radiology    DG CHEST PORT 1 VIEW  Result Date: 04/02/2022 CLINICAL DATA:  Chest pain of uncertain etiology EXAM: PORTABLE CHEST 1 VIEW COMPARISON:  06/07/2021 FINDINGS: Single frontal view of the chest demonstrates an enlarged cardiac silhouette. There is congestion of the central pulmonary vasculature, with bilateral perihilar airspace disease. Additionally, there are bilateral  pleural effusions, right much larger than left. The right pleural effusion may be partially loculated. No pneumothorax. IMPRESSION: 1. Constellation of findings most consistent with congestive heart failure, though underlying infection would be difficult to exclude. Radiographic follow-up after appropriate medical management is recommended. Electronically Signed   By: Randa Ngo M.D.   On: 04/02/2022 17:19    Cardiac Studies   2d echo pending  Patient Profile     76 y.o. male with CAD (prior stenting of RCA and OM2 in 2006, STEMI 2019 s/p DES to [facility] and PDA), cognitive impairment/dementia, tobacco abuse, DM, HTN, HLD, chronic anemia, PAF on Coumadin, carotid artery stenosis (79-89% RICA, 21-19% LICA in 12/1738), PAD s/p R AKA 01/2022 (followed at [facility]) who presented to the hospital with chest pain and SOB, historian somewhat challenging due to suspected dementia. Found to have elevated troponin and a/c CHF.  Assessment & Plan    1. CAD, HLD here with suspected NSTEMI - troponin peak here 1,635 - initially there was question of possible cath when volume status stable but candidacy is challenging due to comorbidities including CKD, anemia, dementia, sacral pressure ulcer, urinary retention, and mental status. He remains confused. Earlier this admission, he required restraints and was pulling at lines, trying to get out of bed - per discussion with Dr. Margaretann Loveless, will plan to manage medically with addition of maintenance dose Plavix to chronic Coumadin (hold ASA), as risk of cath could potentially outweigh benefits - continue metoprolol - rosuvastatin titrated this admission from 5 to '20mg'$  daily for LDL 72 - if the patient is tolerating statin at time of follow-up appointment, would consider rechecking liver function/lipid panel in 6-8 weeks - OK to resume Coumadin  2. Acute on chronic diastolic CHF - found have BNP 1319 on admission here with CXR with bilateral pleural effusions and pulmonary  edema  - repeat echo pending (previous EF 50-55%) - IV Lasix started 7/16 -> will order daily weights; so far -1.1 net with -1.8L UOP yesterday - continue diuresis and await echo results  3. Paroxysmal atrial fibrillation, on home Coumadin - was NSR on arrival here to [facility] but then went into AF RVR 04/03/22 - INR reported to be 6.9 at OSH prior to arrival - on warfarin at home which was on hold with initiation of heparin for cath -  given plan for  medical management, will resume Coumadin per pharmacy - digoxin stopped on admission, level 0.9, suspect stopped for renal insufficiency? - remains on metoprolol '25mg'$  BID - per d/w Dr. Margaretann Loveless - start IV amiodarone - reviewed with pharmacy and actual iodine allergy should be OK to utilize, will follow clinically - TSH mildly elevated, add Ft4 to labs  4. AKI on CKD stage 2 - prior OP Cr by outside labs was 1.0-1.3, admitted with 1.9-2 range  - remains plateaued, follow  5. Carotid artery stenosis - 29-57% RICA, 47-34% LICA in 0/3709 - continue OP follow-up although could consider 6 month follow-up study here  6. Diabetes mellitus - per IM  7. Chronic appearing anemia - per IM  8. Cognitive deficits - suspected of dementia; further per primary team   For questions or updates, please contact [place] Please consult [email or web address] for contact info under Cardiology/STEMI.  Signed, Charlie Pitter, PA-C 04/04/2022, 8:41 AM    Patient seen and examined with Melina Copa PA-C.  Agree as above, with the following exceptions and changes as noted below. Remains altered. Patient refused physical exam beyond neuro. No pain or palpitations. Gen: NAD, Neuro/Psych: oriented only to person, notes he is at "[place] [facility]" and it is "Nov 17". All available labs, radiology testing, previous records reviewed. Given mental status, I would favor medical management of NSTEMI. Echo pending. Recommend adding plavix for medical mangement, would DC  aspirin when warfarin resumed and continue warfarin and plavix for 1 year if no bleeding for NSTEMI and afib anticoagulation. Continue to diurese as above.  Elouise Munroe, MD 04/04/22 11:06 AM

## 2022-04-04 NOTE — Progress Notes (Signed)
ANTICOAGULATION CONSULT NOTE - Initial Consult  Pharmacy Consult for warfarin and heparin Indication: atrial fibrillation  Allergies  Allergen Reactions   Betadine [Povidone Iodine] Swelling    Facial swelling   Fish Allergy Swelling    Facial swelling. Many years ago.   Iodine Swelling    Facial swelling   Shellfish Allergy Swelling    Facial swelling   Sulfamethoxazole-Trimethoprim Swelling    Facial swelling   Gabapentin Other (See Comments)    Unknown reaction   Chlorhexidine    Trimethoprim Swelling    Facial swelling - Doesn't think he ever took this by itself.   Tramadol Other (See Comments)    Makes him crazy    Patient Measurements: Height: '6\' 1"'$  (185.4 cm) Weight: 85.9 kg (189 lb 6 oz) IBW/kg (Calculated) : 79.9  Vital Signs: Temp: 98.1 F (36.7 C) (07/17 1008) Temp Source: Oral (07/17 1008) BP: 102/60 (07/17 1008) Pulse Rate: 115 (07/17 1008)  Labs: Recent Labs    04/02/22 1656 04/02/22 1656 04/02/22 1826 04/02/22 2217 04/02/22 2217 04/03/22 0326 04/03/22 1246 04/03/22 2027 04/04/22 0346  HGB  --    < >  --  9.2*  --  8.9*  --   --  9.3*  HCT  --   --   --  26.9*  --  26.6*  --   --  28.4*  PLT  --   --   --  252  --  242  --   --  269  LABPROT 20.6*  --   --   --   --   --   --   --  17.9*  INR 1.8*  --   --   --   --   --   --   --  1.5*  HEPARINUNFRC  --   --   --   --    < > <0.10* <0.10* 0.22* 0.12*  CREATININE 2.17*  --   --  2.14*  --  2.04*  --   --  1.92*  TROPONINIHS 1,335*  --  1,635*  --   --   --   --   --   --    < > = values in this interval not displayed.    Estimated Creatinine Clearance: 38.1 mL/min (A) (by C-G formula based on SCr of 1.92 mg/dL (H)).   Medical History: Past Medical History:  Diagnosis Date   Arthritis    CAD (coronary artery disease)    a. s/p prior stenting of RCA and OM2 in 2006 b. s/p STEMI in 10/2017 with stenting of mid-RCA and PDA with DES x2   Chronic pain    Diabetes mellitus ([place])     Hyperlipidemia    Hypertension    STEMI (ST elevation myocardial infarction) ([place])    Tobacco use     Assessment: 76 y/o male presented with chest pain to OSH ([place]) and transferred to [facility]. PMH significant for CAD s/p 2 stents (2019), atrial fibrillation on warfarin ('5mg'$  MWF, 2.'5mg'$  all other days), PAD s/p R AKA (01/2022), HTN, former smoker (quit ~2 weeks ago), HLD, anxiety/depression. On admission, warfarin was held and IV heparin was started for ACS. Team has decided to manage medically without intervention and has consulted pharmacy to restart PTA warfarin, will continue bridge with heparin until INR > 2. INR 1.5 today, no signs/symptoms of bleeding, CBC WNL.   Goal of Therapy:  Anti-Xa 0.3-0.7 units/mL INR 2-3 Monitor platelets by anticoagulation protocol: Yes  Plan:  Continue IV heparin 1700 units/mL Restart warfarin 5 mg x1 Check INR daily Monitor CBC and s/sx of bleeding  Louanne Belton, PharmD, [facility] PGY1 Pharmacy Resident 04/04/2022 11:01 AM

## 2022-04-04 NOTE — Progress Notes (Signed)
[place] for warfarin and heparin Indication: atrial fibrillation  Allergies  Allergen Reactions   Betadine [Povidone Iodine] Swelling    Facial swelling   Fish Allergy Swelling    Facial swelling. Many years ago.   Iodine Swelling    Facial swelling   Shellfish Allergy Swelling    Facial swelling   Sulfamethoxazole-Trimethoprim Swelling    Facial swelling   Gabapentin Other (See Comments)    Unknown reaction   Chlorhexidine    Trimethoprim Swelling    Facial swelling - Doesn't think he ever took this by itself.   Tramadol Other (See Comments)    Makes him crazy    Patient Measurements: Height: '6\' 1"'$  (185.4 cm) Weight: 85.9 kg (189 lb 6 oz) IBW/kg (Calculated) : 79.9  Vital Signs: Temp: 98.5 F (36.9 C) (07/17 1141) Temp Source: Oral (07/17 1141) BP: 111/76 (07/17 1141) Pulse Rate: 124 (07/17 1141)  Labs: Recent Labs    04/02/22 1656 04/02/22 1656 04/02/22 1826 04/02/22 2217 04/03/22 0326 04/03/22 1246 04/03/22 2027 04/04/22 0346 04/04/22 1404  HGB  --    < >  --  9.2* 8.9*  --   --  9.3*  --   HCT  --   --   --  26.9* 26.6*  --   --  28.4*  --   PLT  --   --   --  252 242  --   --  269  --   LABPROT 20.6*  --   --   --   --   --   --  17.9*  --   INR 1.8*  --   --   --   --   --   --  1.5*  --   HEPARINUNFRC  --   --   --   --  <0.10*   < > 0.22* 0.12* 0.19*  CREATININE 2.17*  --   --  2.14* 2.04*  --   --  1.92*  --   TROPONINIHS 1,335*  --  1,635*  --   --   --   --   --   --    < > = values in this interval not displayed.     Estimated Creatinine Clearance: 38.1 mL/min (A) (by C-G formula based on SCr of 1.92 mg/dL (H)).   Medical History: Past Medical History:  Diagnosis Date   Arthritis    CAD (coronary artery disease)    a. s/p prior stenting of RCA and OM2 in 2006 b. s/p STEMI in 10/2017 with stenting of mid-RCA and PDA with DES x2   Chronic pain    Diabetes mellitus ([place])    Hyperlipidemia     Hypertension    STEMI (ST elevation myocardial infarction) ([place])    Tobacco use     Assessment: 76 y/o male presented with chest pain to OSH ([place]) and transferred to [facility]. Pt on warfarin PTA for AFib which was held on admit for possible cath.  Warfarin to resume tonight, heparin level is subtherapeutic at 0.19. No infusion issues per RN or bleeding.  Home warfarin '5mg'$  MWF, 2.'5mg'$  AODs.    Goal of Therapy:  Anti-Xa 0.3-0.7 units/mL INR 2-3 Monitor platelets by anticoagulation protocol: Yes   Plan:  Increase heparin 1900 units/h Recheck heparin level with am levels  Arrie Senate, PharmD, BCPS, [facility] Clinical Pharmacist [phone] Please check AMION for all [facility] Pharmacy numbers 04/04/2022

## 2022-04-04 NOTE — Progress Notes (Signed)
[place] for heparin (warfarin on hold) Indication: chest pain/ACS  Allergies  Allergen Reactions   Betadine [Povidone Iodine] Swelling    Facial swelling   Fish Allergy Swelling    Facial swelling. Many years ago.   Iodine Swelling    Facial swelling   Shellfish Allergy Swelling    Facial swelling   Sulfamethoxazole-Trimethoprim Swelling    Facial swelling   Gabapentin Other (See Comments)    Unknown reaction   Chlorhexidine    Trimethoprim Swelling    Facial swelling - Doesn't think he ever took this by itself.   Tramadol Other (See Comments)    Makes him crazy    Patient Measurements: Height: '6\' 1"'$  (185.4 cm) Weight: 85.9 kg (189 lb 6 oz) IBW/kg (Calculated) : 79.9 Heparin Dosing Weight: 85.9 kg  Vital Signs: Temp: 98.2 F (36.8 C) (07/17 0405) Temp Source: Oral (07/17 0405) BP: 113/65 (07/17 0405) Pulse Rate: 114 (07/17 0405)  Labs: Recent Labs    04/02/22 1656 04/02/22 1656 04/02/22 1826 04/02/22 2217 04/02/22 2217 04/03/22 0326 04/03/22 1246 04/03/22 2027 04/04/22 0346  HGB  --    < >  --  9.2*  --  8.9*  --   --  9.3*  HCT  --   --   --  26.9*  --  26.6*  --   --  28.4*  PLT  --   --   --  252  --  242  --   --  269  LABPROT 20.6*  --   --   --   --   --   --   --  17.9*  INR 1.8*  --   --   --   --   --   --   --  1.5*  HEPARINUNFRC  --   --   --   --    < > <0.10* <0.10* 0.22* 0.12*  CREATININE 2.17*  --   --  2.14*  --  2.04*  --   --  1.92*  TROPONINIHS 1,335*  --  1,635*  --   --   --   --   --   --    < > = values in this interval not displayed.     Estimated Creatinine Clearance: 38.1 mL/min (A) (by C-G formula based on SCr of 1.92 mg/dL (H)).   Medical History: Past Medical History:  Diagnosis Date   Arthritis    CAD (coronary artery disease)    a. s/p prior stenting of RCA and OM2 in 2006 b. s/p STEMI in 10/2017 with stenting of mid-RCA and PDA with DES x2   Chronic pain    Diabetes  mellitus ([place])    Hyperlipidemia    Hypertension    STEMI (ST elevation myocardial infarction) ([place])    Tobacco use     Medications:  Medications Prior to Admission  Medication Sig Dispense Refill Last Dose   acetaminophen (TYLENOL) 500 MG tablet Take 1,000 mg by mouth every 6 (six) hours as needed for mild pain or headache.      aspirin 81 MG chewable tablet Chew 1 tablet by mouth daily.      digoxin (LANOXIN) 0.125 MG tablet Take 1 tablet by mouth daily.      divalproex (DEPAKOTE) 250 MG DR tablet Take 250 mg by mouth 2 (two) times daily.      folic acid (FOLVITE) 1 MG tablet Take 1 tablet by mouth daily.  glipiZIDE (GLUCOTROL) 5 MG tablet Take 1 tablet by mouth daily.      insulin aspart (NOVOLOG FLEXPEN) 100 UNIT/ML FlexPen Inject into the skin 3 (three) times daily with meals.      insulin degludec (TRESIBA FLEXTOUCH) 200 UNIT/ML FlexTouch Pen Inject into the skin.      Insulin Glargine Solostar (LANTUS) 100 UNIT/ML Solostar Pen Inject 8 Units into the skin at bedtime.      LINZESS 290 MCG CAPS capsule Take 290 mcg by mouth daily.      lisinopril (ZESTRIL) 2.5 MG tablet Take 2.5 mg by mouth daily.      lisinopril (ZESTRIL) 5 MG tablet Take 5 mg by mouth daily.      metoprolol tartrate (LOPRESSOR) 25 MG tablet Take 1 tablet (25 mg total) by mouth 2 (two) times daily. 60 tablet 0    nicotine (NICODERM CQ - DOSED IN MG/24 HOURS) 21 mg/24hr patch Place 21 mg onto the skin daily.      nitroGLYCERIN (NITROSTAT) 0.4 MG SL tablet Place 1 tablet (0.4 mg total) under the tongue every 5 (five) minutes x 3 doses as needed for chest pain. (Patient not taking: Reported on 02/24/2022) 25 tablet 2    QUEtiapine (SEROQUEL) 100 MG tablet Take 1 tablet by mouth daily.      rosuvastatin (CRESTOR) 5 MG tablet Take 5 mg by mouth daily.      sertraline (ZOLOFT) 100 MG tablet Take 100 mg by mouth daily.      sertraline (ZOLOFT) 25 MG tablet Take 3 tablets by mouth daily.      silver sulfADIAZINE  (SILVADENE) 1 % cream Apply 1 application topically daily.      tamsulosin (FLOMAX) 0.4 MG CAPS capsule Take 0.4 mg by mouth daily.      valproic acid (DEPAKENE) 250 MG/5ML solution Take 5 mLs by mouth in the morning, at noon, and at bedtime.      vitamin B-12 1000 MCG tablet Take 1 tablet (1,000 mcg total) by mouth daily.      warfarin (COUMADIN) 2.5 MG tablet Take 2.5 mg by mouth daily.      warfarin (COUMADIN) 5 MG tablet Take 2.5 mg by mouth at bedtime.      Scheduled:   aspirin  81 mg Oral Daily   furosemide  40 mg Intravenous BID   insulin aspart  0-15 Units Subcutaneous TID WC   insulin aspart  0-5 Units Subcutaneous QHS   insulin glargine-yfgn  8 Units Subcutaneous QHS   metoprolol tartrate  25 mg Oral BID   nicotine  21 mg Transdermal Daily   QUEtiapine  100 mg Oral Daily   rosuvastatin  20 mg Oral Daily   sertraline  75 mg Oral Daily   sodium chloride flush  3 mL Intravenous Q12H   tamsulosin  0.4 mg Oral Daily   valproic acid  250 mg Oral BID   Infusions:   sodium chloride     heparin 1,500 Units/hr (04/04/22 0421)    Assessment: 76 y.o M patient presented with chest pain to outside facility [facility] and transferred to [facility]. 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  Chest pain is worse when doing things and has gotten worse today (7/15) especially with coughing. Denied improvement with SL NTG, but RN reported improvement in chest pain with SL NTG from 10--> 3.  ER course at [facility]: According to chart, patient received '5mg'$  of Vit. K for INR 6.9 with no s/sx of bleeding. CT Angio chest negative for PE but showed multifocal pneumonia and right loculated pleural effusion. Hypertensive with flash pulmonary edema on CXR.  HS troponin 15 -> 63.  No evidence of acute ischemia on 12 lead EKG.  Pharmacy consulted to dose heparin.  Patient has no s/sx of bleeding according to the nurse, INR came back at 1.8, no other labs at this time.  7/17 AM update:  Heparin level low INR 1.5  Goal of Therapy:  Heparin level 0.3-0.7 units/ml Monitor platelets by anticoagulation protocol: Yes   Plan:  Inc heparin to 1700 units/hr 1400 heparin level  Narda Bonds, PharmD, BCPS Clinical Pharmacist Phone: [phone]

## 2022-04-04 NOTE — Progress Notes (Signed)
   04/04/22 0758  Assess: MEWS Score  Temp 97.8 F (36.6 C)  BP 110/74  MAP (mmHg) 85  Pulse Rate (!) 114  ECG Heart Rate (!) 111  Resp 15  O2 Device Nasal Cannula  Assess: MEWS Score  MEWS Temp 0  MEWS Systolic 0  MEWS Pulse 2  MEWS RR 0  MEWS LOC 0  MEWS Score 2  MEWS Score Color Yellow  Assess: if the MEWS score is Yellow or Red  Were vital signs taken at a resting state? Yes  Focused Assessment No change from prior assessment  Does the patient meet 2 or more of the SIRS criteria? No  MEWS guidelines implemented *See Row Information* Yes  Treat  MEWS Interventions Other (Comment) (continued to monitor)  Early Detection of Sepsis Score *See Row Information* Low  Take Vital Signs  Increase Vital Sign Frequency  Yellow: Q 2hr X 2 then Q 4hr X 2, if remains yellow, continue Q 4hrs  Escalate  MEWS: Escalate Yellow: discuss with charge nurse/RN and consider discussing with provider and RRT  Notify: Charge Nurse/RN  Name of Charge Nurse/RN Notified Christy RN  Date Charge Nurse/RN Notified 04/04/22  Time Charge Nurse/RN Notified 0800  Notify: Provider  Provider Name/Title Elgergawy  Date Provider Notified 04/04/22  Time Provider Notified 0800  Method of Notification Page  Notification Reason Other (Comment) (new yellow MEWS)  Provider response At bedside  Date of Provider Response 04/04/22  Time of Provider Response [phone]  Document  Patient Outcome Other (Comment) (patient remains stable)  Assess: SIRS CRITERIA  SIRS Temperature  0  SIRS Pulse 1  SIRS Respirations  0  SIRS WBC 0  SIRS Score Sum  1

## 2022-04-04 NOTE — TOC Initial Note (Signed)
Transition of Care [facility]) - Initial/Assessment Note    Patient Details  Name: Dennis [name] MRN: [id number] Date of Birth: May 21, 1946  [facility] CM/SW Contact:    Bethena Roys, RN Phone Number: 04/04/2022, 1:05 PM  Clinical Narrative: Risk for readmission assessment completed. PTA patient was from home alone; however, ex-wife [name] states she has been in his home since he left the [place] in [place]. [name] states the patient has a hospital bed, wheelchair/bedside commode with drop arm handle, lift chair, and will receive a shower chair today. [name] states the patient is in need of a slide board. Patient is currently active with [facility] for RN,PT,OT,Aide Services. If the plan is to return home, clinicals will need to be faxed to [phone]. [name] states the patient has no children and only has one brother that is disabled. Patient will benefit from PT/OT consult once stable. Case Manager will continue to follow for additional transition of care needs.   Expected Discharge Plan: Skilled Nursing Facility Barriers to Discharge: Continued Medical Work up   Expected Discharge Plan and Services Expected Discharge Plan: [place]   Discharge Planning Services: CM Consult Post Acute Care Choice: [place] Living arrangements for the past 2 months: Single Family Home                           HH Arranged: RN, Disease Management, PT, OT, Nurse's Aide HH Agency: Other - See comment ([place]) Date [facility] Agency Contacted: 04/04/22 Time HH Agency Contacted: 12 Representative spoke with at [place]: [place]  Prior Living Arrangements/Services Living arrangements for the past 2 months: [place] with:: Self (However, his ex-wife has been staying with the patient throughout the day.) Patient language and need for interpreter  reviewed:: Yes        Need for Family Participation in Patient Care: Yes (Comment) Care giver support system in place?: Yes (comment) Current home services: DME, Home OT, Home PT, Home RN, Homehealth aide (Patient is active with [facility] for PT,OT, RN, Aide. Pt has wheelchair/3n1 with drop handle, lift chair, shower chair to be delivered to the home today. Patient in need of slide board.)    Activities of Daily Living      Permission Sought/Granted Permission sought to share information with : Family Supports, Case Freight forwarder, Investment banker, corporate granted to share info w AGENCY: [place]           Psych Involvement: No (comment)  Admission diagnosis:  ACS (acute coronary syndrome) ([place]) [I24.9] Patient Active Problem List   Diagnosis Date Noted   ACS (acute coronary syndrome) ([place]) 04/02/2022   Type 2 diabetes mellitus with complication, with long-term current use of insulin ([place]) 04/02/2022   Dementia with behavioral disturbance ([place]) 04/02/2022   Chronic kidney disease, stage 3a ([place]) 04/02/2022   PAD (peripheral artery disease) ([place]) 07/01/2019   Palpitations 01/23/2018   Chronic anticoagulation 01/23/2018   Chronic back pain 01/23/2018   Atrial fibrillation with RVR (HCC)    PAF (paroxysmal atrial fibrillation) ([place]) 12/15/2017   Delirium    Closed right hip fracture ([place]) 12/09/2017   CAD S/P percutaneous coronary angioplasty 12/04/2017   Hypertension 12/04/2017   Hyperlipidemia 11/15/2017   Tobacco abuse 11/13/2017   History of ST elevation myocardial infarction (STEMI) 11/13/2017   Insulin  dependent diabetes mellitus with complications 07/13/4861   PCP:  Glenda Chroman, MD Pharmacy:   [place], [place] [address] Phone: [phone] Fax: [phone]  [facility] [address] [place]  [address] Phone: [phone] Fax: [place], [place] [address] [place] [address] Phone: [phone] Fax: [phone]    Readmission Risk Interventions    04/04/2022   12:57 PM  Readmission Risk Prevention Plan  Transportation Screening Complete  PCP or Specialist Appt within 3-5 Days Complete  HRI or Home Care Consult Complete  Social Work Consult for [place] Planning/Counseling Complete  Palliative Care Screening Not Applicable  Medication Review Press photographer) Referral to Pharmacy

## 2022-04-04 NOTE — Progress Notes (Addendum)
PROGRESS NOTE    Dennis [name]  JOI:[id number] DOB: 12/27/1945 DOA: 04/02/2022 PCP: Glenda Chroman, MD    Brief Narrative:   Dennis [name] is a 76 y.o. 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  He reports chest pain started 2 days ago.  It may have been exertional.  It comes and goes.  Better with rest.  Worse with doing thing and at nights.   Pain is substernal. He quit smoking about 2 weeks ago.     Assessment and Plan: NSTEMI -Patient with substernal chest pain that came on acutely with exertion (this is questionable) and improved with rest/NTG. -Mangement per cardiology, continue with heparin GTT. -Initial plan for cardiac cath once renal function and volume status has stabilized, but given his advanced dementia, recommendation for conservative management. -Continue with statin, heparin GTT, Plavix was added.  HTN -Continue lopressor -Hold lisinopril  HLD -Continue Crestor LDL 72   DM -A1C: 5.4-- improved from prior -Hold glipizide -Continue Lantus qhs -SSI   A-fib with RVR -Heart rate uncontrolled today, continue with low. -Heart rate remains uncontrolled, started on amiodarone drip -On warfarin for anticoagulation   Dementia/Mood d/o - delirium precautions -Continue Seroquel, Sertraline, Depakote   PVD -s/p R AKA, well healed   Stage 3a CKD -Slightly worse than baseline -Hold lisinopril -monitor with diuresis   Tobacco dependence -Reports quitting 2 weeks ago, continue to encourage cessation.   -Patch ordered    Sacral pressure ulcer -RN noted stage 2 sacral pressure ulcer, present on admission -Would request wound care consult -photo in chart   Acute urinary retention -Foley placed 7/15 for retention, will attempt voiding trial today       DVT prophylaxis: heparin  gtt warfarin (COUMADIN) tablet 5 mg    Code Status: DNR Family communication: Tried to reach wife, no answer   Disposition Plan:  Level of care: Progressive Status is: Inpatient Remains inpatient appropriate because: needs cath    Consultants:  cards   Subjective: Currently denies any complaints, but demented at baseline, complains of a reliable  Objective: Vitals:   04/04/22 0758 04/04/22 0846 04/04/22 1008 04/04/22 1141  BP: 110/74 127/65 102/60 111/76  Pulse: (!) 114 (!) 117 (!) 115 (!) 124  Resp: '15 16 19 17  '$ Temp: 97.8 F (36.6 C) 97.7 F (36.5 C) 98.1 F (36.7 C) 98.5 F (36.9 C)  TempSrc: Oral Oral Oral Oral  SpO2:      Weight:      Height:        Intake/Output Summary (Last 24 hours) at 04/04/2022 1240 Last data filed at 04/04/2022 1219 Gross per 24 hour  Intake 1780.9 ml  Output 1475 ml  Net 305.9 ml   Filed Weights   04/02/22 1700  Weight: 85.9 kg    Examination:  Awake Alert, pleasant, demented Symmetrical Chest wall movement, Good air movement bilaterally, CTAB RRR,No Gallops,Rubs or new Murmurs, No Parasternal Heave +ve B.Sounds, Abd Soft, No tenderness, No rebound - guarding or rigidity. No Cyanosis, Clubbing or edema, No new Rash or bruise, right AKA      Data Reviewed: I have personally reviewed following labs and imaging studies  CBC: Recent Labs  Lab 04/02/22 2217 04/03/22 0326 04/04/22 0346  WBC 12.4* 11.4* 10.7*  HGB 9.2* 8.9* 9.3*  HCT 26.9* 26.6* 28.4*  MCV  90.0 90.2 91.9  PLT 252 242 694   Basic Metabolic Panel: Recent Labs  Lab 04/02/22 1656 04/02/22 2217 04/03/22 0326 04/04/22 0346  NA 138 136 139 139  K 5.8* 6.0* 5.0 3.9  CL 106 103 110 105  CO2 18* 20* 21* 27  GLUCOSE 246* 256* 108* 118*  BUN 54* 55* 54* 50*  CREATININE 2.17* 2.14* 2.04* 1.92*  CALCIUM 8.6* 8.3* 8.3* 8.2*  MG  --  2.1  --   --   PHOS  --  4.1  --   --    GFR: Estimated Creatinine Clearance: 38.1 mL/min (A) (by C-G formula based on  SCr of 1.92 mg/dL (H)). Liver Function Tests: Recent Labs  Lab 04/02/22 1656  AST 26  ALT 29  ALKPHOS 78  BILITOT 0.7  PROT 6.8  ALBUMIN 2.9*   No results for input(s): "LIPASE", "AMYLASE" in the last 168 hours. No results for input(s): "AMMONIA" in the last 168 hours. Coagulation Profile: Recent Labs  Lab 04/02/22 1656 04/04/22 0346  INR 1.8* 1.5*   Cardiac Enzymes: No results for input(s): "CKTOTAL", "CKMB", "CKMBINDEX", "TROPONINI" in the last 168 hours. BNP (last 3 results) No results for input(s): "PROBNP" in the last 8760 hours. HbA1C: Recent Labs    04/02/22 1656  HGBA1C 5.4   CBG: Recent Labs  Lab 04/03/22 2045 04/03/22 2350 04/04/22 0402 04/04/22 0755 04/04/22 1124  GLUCAP 79 165* 124* 148* 112*   Lipid Profile: Recent Labs    04/02/22 1656  CHOL 128  HDL 44  LDLCALC 72  TRIG 58  CHOLHDL 2.9   Thyroid Function Tests: Recent Labs    04/02/22 1656  TSH 5.471*   Anemia Panel: No results for input(s): "VITAMINB12", "FOLATE", "FERRITIN", "TIBC", "IRON", "RETICCTPCT" in the last 72 hours. Sepsis Labs: No results for input(s): "PROCALCITON", "LATICACIDVEN" in the last 168 hours.  No results found for this or any previous visit (from the past 240 hour(s)).       Radiology Studies: ECHOCARDIOGRAM COMPLETE  Result Date: 04/04/2022    ECHOCARDIOGRAM REPORT   Patient Name:   Dennis [name] Date of Exam: 04/04/2022 Medical Rec #:  [id number]       Height:       73.0 in Accession #:    [id number]      Weight:       189.4 lb Date of Birth:  10/10/1945       BSA:          2.102 m Patient Age:    89 years        BP:           127/65 mmHg Patient Gender: M               HR:           104 bpm. Exam Location:  Inpatient Procedure: 2D Echo, Cardiac Doppler and Color Doppler Indications:    Chest pain  History:        Patient has prior history of Echocardiogram examinations, most                 recent 11/14/2017. CAD, PAD, Arrythmias:Atrial Fibrillation; Risk                  Factors:Hypertension and HLD. Dementia, Tobacco dependence.  Sonographer:    Joette Catching RCS Referring Phys: [place]  1. Left ventricular ejection fraction, by estimation, is 50 to 55%. The left ventricle has low normal  function. The left ventricle demonstrates regional wall motion abnormalities (see scoring diagram/findings for description). Left ventricular diastolic  function could not be evaluated. There is mild hypokinesis of the left ventricular, entire anteroseptal wall and anterior wall.  2. Right ventricular systolic function is normal. The right ventricular size is normal. There is normal pulmonary artery systolic pressure.  3. A small pericardial effusion is present.  4. The mitral valve is grossly normal. Trivial mitral valve regurgitation. No evidence of mitral stenosis.  5. The aortic valve is grossly normal. Aortic valve regurgitation is not visualized. No aortic stenosis is present.  6. The inferior vena cava is normal in size with <50% respiratory variability, suggesting right atrial pressure of 8 mmHg. Comparison(s): Changes from prior study are noted. Conclusion(s)/Recommendation(s): Technically challenging study, as best images are subcostal. Overall low normal EF. Difficult to assess regional wall motion, but there is mild hypokinesis in the visualized portions of the anteroseptal and anterior wall.  In atrial fibrillation with RVR throughout study. FINDINGS  Left Ventricle: Left ventricular ejection fraction, by estimation, is 50 to 55%. The left ventricle has low normal function. The left ventricle demonstrates regional wall motion abnormalities. Mild hypokinesis of the left ventricular, entire anteroseptal wall and anterior wall. The left ventricular internal cavity size was normal in size. There is no left ventricular hypertrophy. Left ventricular diastolic function could not be evaluated due to atrial fibrillation. Left ventricular diastolic   function could not be evaluated. Right Ventricle: The right ventricular size is normal. Right vetricular wall thickness was not well visualized. Right ventricular systolic function is normal. There is normal pulmonary artery systolic pressure. The tricuspid regurgitant velocity is 2.14 m/s, and with an assumed right atrial pressure of 8 mmHg, the estimated right ventricular systolic pressure is 47.8 mmHg. Left Atrium: Left atrial size was not well visualized. Right Atrium: Right atrial size was not well visualized. Pericardium: A small pericardial effusion is present. Mitral Valve: The mitral valve is grossly normal. There is mild thickening of the mitral valve leaflet(s). There is mild calcification of the mitral valve leaflet(s). Trivial mitral valve regurgitation. No evidence of mitral valve stenosis. Tricuspid Valve: The tricuspid valve is normal in structure. Tricuspid valve regurgitation is trivial. No evidence of tricuspid stenosis. Aortic Valve: The aortic valve is grossly normal. Aortic valve regurgitation is not visualized. No aortic stenosis is present. Aortic valve mean gradient measures 4.0 mmHg. Aortic valve peak gradient measures 7.2 mmHg. Aortic valve area, by VTI measures 1.85 cm. Pulmonic Valve: The pulmonic valve was not well visualized. Pulmonic valve regurgitation is not visualized. No evidence of pulmonic stenosis. Aorta: The aortic root, ascending aorta, aortic arch and descending aorta are all structurally normal, with no evidence of dilitation or obstruction. Venous: The inferior vena cava is normal in size with less than 50% respiratory variability, suggesting right atrial pressure of 8 mmHg. IAS/Shunts: The interatrial septum was not well visualized.  LEFT VENTRICLE PLAX 2D LVIDd:         4.40 cm   Diastology LVIDs:         3.70 cm   LV e' medial:    7.30 cm/s LV PW:         1.00 cm   LV E/e' medial:  16.0 LV IVS:        1.15 cm   LV e' lateral:   9.81 cm/s LVOT diam:     1.90 cm   LV  E/e' lateral: 11.9 LV SV:  40 LV SV Index:   19 LVOT Area:     2.84 cm  RIGHT VENTRICLE             IVC RV Basal diam:  3.10 cm     IVC diam: 2.00 cm RV Mid diam:    2.70 cm RV S prime:     12.90 cm/s TAPSE (M-mode): 1.9 cm LEFT ATRIUM             Index        RIGHT ATRIUM           Index LA diam:        4.20 cm 2.00 cm/m   RA Area:     11.60 cm LA Vol (A2C):   43.8 ml 20.83 ml/m  RA Volume:   22.30 ml  10.61 ml/m LA Vol (A4C):   33.5 ml 15.93 ml/m LA Biplane Vol: 38.9 ml 18.50 ml/m  AORTIC VALVE                    PULMONIC VALVE AV Area (Vmax):    1.98 cm     PV Vmax:       0.98 m/s AV Area (Vmean):   1.93 cm     PV Peak grad:  3.9 mmHg AV Area (VTI):     1.85 cm AV Vmax:           134.00 cm/s AV Vmean:          90.500 cm/s AV VTI:            0.218 m AV Peak Grad:      7.2 mmHg AV Mean Grad:      4.0 mmHg LVOT Vmax:         93.50 cm/s LVOT Vmean:        61.600 cm/s LVOT VTI:          0.142 m LVOT/AV VTI ratio: 0.65  AORTA Ao Root diam: 2.80 cm MITRAL VALVE                TRICUSPID VALVE MV Area (PHT): 3.65 cm     TR Peak grad:   18.3 mmHg MV Decel Time: 208 msec     TR Vmax:        214.00 cm/s MV E velocity: 117.00 cm/s MV A velocity: 37.50 cm/s   SHUNTS MV E/A ratio:  3.12         Systemic VTI:  0.14 m                             Systemic Diam: 1.90 cm Buford Dresser MD Electronically signed by Buford Dresser MD Signature Date/Time: 04/04/2022/11:53:33 AM    Final    DG CHEST PORT 1 VIEW  Result Date: 04/02/2022 CLINICAL DATA:  Chest pain of uncertain etiology EXAM: PORTABLE CHEST 1 VIEW COMPARISON:  06/07/2021 FINDINGS: Single frontal view of the chest demonstrates an enlarged cardiac silhouette. There is congestion of the central pulmonary vasculature, with bilateral perihilar airspace disease. Additionally, there are bilateral pleural effusions, right much larger than left. The right pleural effusion may be partially loculated. No pneumothorax. IMPRESSION: 1. Constellation of  findings most consistent with congestive heart failure, though underlying infection would be difficult to exclude. Radiographic follow-up after appropriate medical management is recommended. Electronically Signed   By: Randa Ngo M.D.   On: 04/02/2022 17:19        Scheduled Meds:  clopidogrel  75 mg Oral Daily   furosemide  40 mg Intravenous BID   insulin aspart  0-15 Units Subcutaneous TID WC   insulin aspart  0-5 Units Subcutaneous QHS   insulin glargine-yfgn  8 Units Subcutaneous QHS   metoprolol tartrate  25 mg Oral BID   nicotine  21 mg Transdermal Daily   QUEtiapine  100 mg Oral Daily   rosuvastatin  20 mg Oral Daily   sertraline  75 mg Oral Daily   sodium chloride flush  3 mL Intravenous Q12H   tamsulosin  0.4 mg Oral Daily   valproic acid  250 mg Oral BID   warfarin  5 mg Oral ONCE-1600   Warfarin - Pharmacist Dosing Inpatient   Does not apply q1600   Continuous Infusions:  sodium chloride     amiodarone 60 mg/hr (04/04/22 1219)   Followed by   amiodarone     heparin 1,700 Units/hr (04/04/22 1219)     LOS: 2 days    Time spent: 45 minutes spent on chart review, discussion with nursing staff, consultants, updating family and interview/physical exam; more than 50% of that time was spent in counseling and/or coordination of care.    Phillips Climes, MD [facility] Available via Epic secure chat 7am-7pm After these hours, please refer to coverage provider listed on [email or web address] 04/04/2022, 12:40 PM

## 2022-04-04 NOTE — Progress Notes (Signed)
Echocardiogram 2D Echocardiogram has been performed.  Joette Catching 04/04/2022, 9:38 AM

## 2022-04-05 ENCOUNTER — Inpatient Hospital Stay (HOSPITAL_COMMUNITY): Payer: Medicare HMO

## 2022-04-05 DIAGNOSIS — I480 Paroxysmal atrial fibrillation: Secondary | ICD-10-CM | POA: Diagnosis not present

## 2022-04-05 DIAGNOSIS — R9431 Abnormal electrocardiogram [ECG] [EKG]: Secondary | ICD-10-CM

## 2022-04-05 DIAGNOSIS — I249 Acute ischemic heart disease, unspecified: Secondary | ICD-10-CM | POA: Diagnosis not present

## 2022-04-05 DIAGNOSIS — F03918 Unspecified dementia, unspecified severity, with other behavioral disturbance: Secondary | ICD-10-CM

## 2022-04-05 LAB — CBC
HCT: 27.9 % — ABNORMAL LOW (ref 39.0–52.0)
Hemoglobin: 9.6 g/dL — ABNORMAL LOW (ref 13.0–17.0)
MCH: 30.6 pg (ref 26.0–34.0)
MCHC: 34.4 g/dL (ref 30.0–36.0)
MCV: 88.9 fL (ref 80.0–100.0)
Platelets: 260 10*3/uL (ref 150–400)
RBC: 3.14 MIL/uL — ABNORMAL LOW (ref 4.22–5.81)
RDW: 13.1 % (ref 11.5–15.5)
WBC: 8.5 10*3/uL (ref 4.0–10.5)
nRBC: 0 % (ref 0.0–0.2)

## 2022-04-05 LAB — BASIC METABOLIC PANEL
Anion gap: 16 — ABNORMAL HIGH (ref 5–15)
BUN: 46 mg/dL — ABNORMAL HIGH (ref 8–23)
CO2: 25 mmol/L (ref 22–32)
Calcium: 8.2 mg/dL — ABNORMAL LOW (ref 8.9–10.3)
Chloride: 100 mmol/L (ref 98–111)
Creatinine, Ser: 2.08 mg/dL — ABNORMAL HIGH (ref 0.61–1.24)
GFR, Estimated: 33 mL/min — ABNORMAL LOW (ref >60)
Glucose, Bld: 107 mg/dL — ABNORMAL HIGH (ref 70–99)
Potassium: 3.2 mmol/L — ABNORMAL LOW (ref 3.5–5.1)
Sodium: 141 mmol/L (ref 135–145)

## 2022-04-05 LAB — T4, FREE: Free T4: 0.95 ng/dL (ref 0.61–1.12)

## 2022-04-05 LAB — MAGNESIUM: Magnesium: 2 mg/dL (ref 1.7–2.4)

## 2022-04-05 LAB — GLUCOSE, CAPILLARY
Glucose-Capillary: 119 mg/dL — ABNORMAL HIGH (ref 70–99)
Glucose-Capillary: 149 mg/dL — ABNORMAL HIGH (ref 70–99)
Glucose-Capillary: 170 mg/dL — ABNORMAL HIGH (ref 70–99)
Glucose-Capillary: 135 mg/dL — ABNORMAL HIGH (ref 70–99)

## 2022-04-05 LAB — HEPARIN LEVEL (UNFRACTIONATED): Heparin Unfractionated: 0.32 IU/mL (ref 0.30–0.70)

## 2022-04-05 LAB — PROTIME-INR
INR: 1.7 — ABNORMAL HIGH (ref 0.8–1.2)
Prothrombin Time: 20 seconds — ABNORMAL HIGH (ref 11.4–15.2)

## 2022-04-05 MED ORDER — VALPROIC ACID 250 MG/5ML PO SOLN
500.0000 mg | Freq: Two times a day (BID) | ORAL | Status: DC
  Administered 2022-04-05 – 2022-04-10 (×10): 500 mg via ORAL
  Filled 2022-04-05 (×12): qty 10

## 2022-04-05 MED ORDER — POTASSIUM CHLORIDE CRYS ER 20 MEQ PO TBCR
40.0000 meq | EXTENDED_RELEASE_TABLET | ORAL | Status: AC
  Administered 2022-04-05 (×2): 40 meq via ORAL
  Filled 2022-04-05 (×2): qty 2

## 2022-04-05 MED ORDER — WARFARIN SODIUM 5 MG PO TABS
5.0000 mg | ORAL_TABLET | Freq: Once | ORAL | Status: AC
  Administered 2022-04-05: 5 mg via ORAL
  Filled 2022-04-05: qty 1

## 2022-04-05 MED ORDER — METOPROLOL TARTRATE 50 MG PO TABS
50.0000 mg | ORAL_TABLET | Freq: Two times a day (BID) | ORAL | Status: DC
Start: 2022-04-05 — End: 2022-04-10
  Administered 2022-04-05 – 2022-04-10 (×10): 50 mg via ORAL
  Filled 2022-04-05 (×10): qty 1

## 2022-04-05 MED ORDER — SERTRALINE HCL 50 MG PO TABS
50.0000 mg | ORAL_TABLET | Freq: Every day | ORAL | Status: DC
  Administered 2022-04-06 – 2022-04-10 (×5): 50 mg via ORAL
  Filled 2022-04-05 (×5): qty 1

## 2022-04-05 MED ORDER — METOPROLOL TARTRATE 25 MG PO TABS
25.0000 mg | ORAL_TABLET | Freq: Once | ORAL | Status: AC
  Administered 2022-04-05: 25 mg via ORAL
  Filled 2022-04-05: qty 1

## 2022-04-05 NOTE — Progress Notes (Signed)
PROGRESS NOTE    Dennis [name]  LFY:[id number] DOB: 09/15/1946 DOA: 04/02/2022 PCP: Glenda Chroman, MD    Brief Narrative:   Dennis [name] is a 76 y.o. 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  He reports chest pain started 2 days ago.  It may have been exertional.  It comes and goes.  Better with rest.  Worse with doing thing and at nights.   Pain is substernal. He quit smoking about 2 weeks ago.   -Patient was found to have elevated troponin, concern for NSTEMI, he was admitted for further work-up   Assessment and Plan:  NSTEMI known history of CAD, hyperlipidemia -Troponin peaked at 1.635 -Management per cardiology, report candidate for cardiac cath, given his dementia, intermittent agitation, pulling lines, and requiring restraints, as well secondary due to CKD, anemia and sacral pressure ulcers, and overall poor functional status. -Continue with conservative management, he remains on heparin GTT till INR within therapeutic range. -Was started on Plavix. -On metoprolol, uptitrated by cardiology. -He is on statin, has rosuvastatin  titrated from 5 to 20 mg.  HTN -Blood pressure is soft, continue to hold lisinopril especially with CKD -We will uptitrated by cardiology  Acute on chronic diastolic CHF Pleural effusion -On IV diuresis, remains on 40 mg IV twice daily, so far -1.1 L this admission -May need thoracentesis if remains with persistent effusion despite diuresis  HLD -LDL 72, rosuvastatin titrated from 5 to 20 mg   DM -A1C: 5.4-- improved from prior -Hold glipizide -Continue Lantus qhs -SSI   A-fib with RVR -Heart rate uncontrolled, -digoxin was  stopped on admission, blood pressure is soft for up titration of beta-blockers -Started on amiodarone drip per cardiology. -On warfarin for  anticoagulation   Dementia/Mood d/o - delirium precautions -On Seroquel, sertraline and Depakote -High risk for hospital delirium, with significant confusion overlying, pulling his lines and Foley catheter. -More calm this morning. -Given his prolonged QTc I will discontinue Seroquel, and decrease Zoloft(will avoid stopping abruptly), and will increase his Depakote meanwhile. -Avoid antipsychotic for delirium's, may do as needed Ativan even though response to benzos will be suboptimal  Prolonged QTc -QTc was within normal limit upon admission, likely worsened in the setting of hypokalemia, initiation of amiodarone. -Will hold Seroquel and decrease Zoloft for now -We will avoid antipsychotic for delirium, may   PVD -s/p R AKA, well healed   Stage 3a CKD -Slightly worse than baseline -Hold lisinopril -monitor with diuresis   Tobacco dependence -Reports quitting 2 weeks ago, continue to encourage cessation.   -Patch ordered    Sacral pressure ulcer -RN noted stage 2 sacral pressure ulcer, present on admission -Would request wound care consult -photo in chart   Acute urinary retention -Foley placed 7/15 for retention, discontinued yesterday, so far no evidence of withdrawals  Hypokalemia -Repleted, continue to monitor closely due to prolonged QTc     DVT prophylaxis: heparin gtt warfarin (COUMADIN) tablet 5 mg    Code Status: DNR Family communication: Discussed with wife by phone   Disposition Plan:  Level of care: Progressive Status is: Inpatient Remains inpatient appropriate because: needs cath    Consultants:  cards   Subjective:  With significant delirium/agitation overnight, removed 2 IV lines, verbally aggressive towards staff, patient has numerous attempts to get out of bed.  Objective: Vitals:  04/05/22 0532 04/05/22 0729 04/05/22 0857 04/05/22 1100  BP: 124/63 (!) 144/81 116/61 104/70  Pulse: (!) 112 (!) 108 (!) 117 77  Resp: '16 13  15  '$ Temp:  97.9 F (36.6 C) 97.6 F (36.4 C)  97.7 F (36.5 C)  TempSrc: Axillary Axillary  Axillary  SpO2: 95% 95%    Weight:      Height:        Intake/Output Summary (Last 24 hours) at 04/05/2022 1511 Last data filed at 04/05/2022 1242 Gross per 24 hour  Intake 1082.92 ml  Output 2625 ml  Net -1542.08 ml   Filed Weights   04/02/22 1700  Weight: 85.9 kg    Examination:  Awake Alert, pleasant, demented Symmetrical Chest wall movement, Good air movement bilaterally, CTAB RRR,No Gallops,Rubs or new Murmurs, No Parasternal Heave +ve B.Sounds, Abd Soft, No tenderness, No rebound - guarding or rigidity. No Cyanosis, Clubbing or edema, No new Rash or bruise, right AKA      Data Reviewed: I have personally reviewed following labs and imaging studies  CBC: Recent Labs  Lab 04/02/22 2217 04/03/22 0326 04/04/22 0346 04/05/22 0703  WBC 12.4* 11.4* 10.7* 8.5  HGB 9.2* 8.9* 9.3* 9.6*  HCT 26.9* 26.6* 28.4* 27.9*  MCV 90.0 90.2 91.9 88.9  PLT 252 242 269 941   Basic Metabolic Panel: Recent Labs  Lab 04/02/22 1656 04/02/22 2217 04/03/22 0326 04/04/22 0346 04/05/22 0703  NA 138 136 139 139 141  K 5.8* 6.0* 5.0 3.9 3.2*  CL 106 103 110 105 100  CO2 18* 20* 21* 27 25  GLUCOSE 246* 256* 108* 118* 107*  BUN 54* 55* 54* 50* 46*  CREATININE 2.17* 2.14* 2.04* 1.92* 2.08*  CALCIUM 8.6* 8.3* 8.3* 8.2* 8.2*  MG  --  2.1  --   --  2.0  PHOS  --  4.1  --   --   --    GFR: Estimated Creatinine Clearance: 35.2 mL/min (A) (by C-G formula based on SCr of 2.08 mg/dL (H)). Liver Function Tests: Recent Labs  Lab 04/02/22 1656  AST 26  ALT 29  ALKPHOS 78  BILITOT 0.7  PROT 6.8  ALBUMIN 2.9*   No results for input(s): "LIPASE", "AMYLASE" in the last 168 hours. No results for input(s): "AMMONIA" in the last 168 hours. Coagulation Profile: Recent Labs  Lab 04/02/22 1656 04/04/22 0346 04/05/22 0703  INR 1.8* 1.5* 1.7*   Cardiac Enzymes: No results for input(s): "CKTOTAL",  "CKMB", "CKMBINDEX", "TROPONINI" in the last 168 hours. BNP (last 3 results) No results for input(s): "PROBNP" in the last 8760 hours. HbA1C: Recent Labs    04/02/22 1656  HGBA1C 5.4   CBG: Recent Labs  Lab 04/04/22 1124 04/04/22 1632 04/04/22 2139 04/05/22 0733 04/05/22 1123  GLUCAP 112* 135* 156* 119* 149*   Lipid Profile: Recent Labs    04/02/22 1656  CHOL 128  HDL 44  LDLCALC 72  TRIG 58  CHOLHDL 2.9   Thyroid Function Tests: Recent Labs    04/02/22 1656 04/05/22 0703  TSH 5.471*  --   FREET4  --  0.95   Anemia Panel: No results for input(s): "VITAMINB12", "FOLATE", "FERRITIN", "TIBC", "IRON", "RETICCTPCT" in the last 72 hours. Sepsis Labs: No results for input(s): "PROCALCITON", "LATICACIDVEN" in the last 168 hours.  No results found for this or any previous visit (from the past 240 hour(s)).       Radiology Studies: DG Chest 2 View  Result Date: 04/05/2022 CLINICAL DATA:  Shortness  of breath.  History of pleural effusion. EXAM: CHEST - 2 VIEW COMPARISON:  04/02/2022 FINDINGS: Stable cardiac enlargement. There is a moderate right pleural effusion with near complete atelectasis of the right lower lobe. Small left pleural effusion identified. No interstitial edema. IMPRESSION: 1. Moderate right pleural effusion and small left pleural effusion. 2. Right lower lobe atelectasis. Electronically Signed   By: Kerby Moors M.D.   On: 04/05/2022 11:24   ECHOCARDIOGRAM COMPLETE  Result Date: 04/04/2022    ECHOCARDIOGRAM REPORT   Patient Name:   BUFFORD HELMS Date of Exam: 04/04/2022 Medical Rec #:  [id number]       Height:       73.0 in Accession #:    [id number]      Weight:       189.4 lb Date of Birth:  03-21-1946       BSA:          2.102 m Patient Age:    70 years        BP:           127/65 mmHg Patient Gender: M               HR:           104 bpm. Exam Location:  Inpatient Procedure: 2D Echo, Cardiac Doppler and Color Doppler Indications:    Chest pain   History:        Patient has prior history of Echocardiogram examinations, most                 recent 11/14/2017. CAD, PAD, Arrythmias:Atrial Fibrillation; Risk                 Factors:Hypertension and HLD. Dementia, Tobacco dependence.  Sonographer:    Joette Catching RCS Referring Phys: [place]  1. Left ventricular ejection fraction, by estimation, is 50 to 55%. The left ventricle has low normal function. The left ventricle demonstrates regional wall motion abnormalities (see scoring diagram/findings for description). Left ventricular diastolic  function could not be evaluated. There is mild hypokinesis of the left ventricular, entire anteroseptal wall and anterior wall.  2. Right ventricular systolic function is normal. The right ventricular size is normal. There is normal pulmonary artery systolic pressure.  3. A small pericardial effusion is present.  4. The mitral valve is grossly normal. Trivial mitral valve regurgitation. No evidence of mitral stenosis.  5. The aortic valve is grossly normal. Aortic valve regurgitation is not visualized. No aortic stenosis is present.  6. The inferior vena cava is normal in size with <50% respiratory variability, suggesting right atrial pressure of 8 mmHg. Comparison(s): Changes from prior study are noted. Conclusion(s)/Recommendation(s): Technically challenging study, as best images are subcostal. Overall low normal EF. Difficult to assess regional wall motion, but there is mild hypokinesis in the visualized portions of the anteroseptal and anterior wall.  In atrial fibrillation with RVR throughout study. FINDINGS  Left Ventricle: Left ventricular ejection fraction, by estimation, is 50 to 55%. The left ventricle has low normal function. The left ventricle demonstrates regional wall motion abnormalities. Mild hypokinesis of the left ventricular, entire anteroseptal wall and anterior wall. The left ventricular internal cavity size was normal in size.  There is no left ventricular hypertrophy. Left ventricular diastolic function could not be evaluated due to atrial fibrillation. Left ventricular diastolic  function could not be evaluated. Right Ventricle: The right ventricular size is normal. Right vetricular wall thickness was not well visualized. Right  ventricular systolic function is normal. There is normal pulmonary artery systolic pressure. The tricuspid regurgitant velocity is 2.14 m/s, and with an assumed right atrial pressure of 8 mmHg, the estimated right ventricular systolic pressure is 76.7 mmHg. Left Atrium: Left atrial size was not well visualized. Right Atrium: Right atrial size was not well visualized. Pericardium: A small pericardial effusion is present. Mitral Valve: The mitral valve is grossly normal. There is mild thickening of the mitral valve leaflet(s). There is mild calcification of the mitral valve leaflet(s). Trivial mitral valve regurgitation. No evidence of mitral valve stenosis. Tricuspid Valve: The tricuspid valve is normal in structure. Tricuspid valve regurgitation is trivial. No evidence of tricuspid stenosis. Aortic Valve: The aortic valve is grossly normal. Aortic valve regurgitation is not visualized. No aortic stenosis is present. Aortic valve mean gradient measures 4.0 mmHg. Aortic valve peak gradient measures 7.2 mmHg. Aortic valve area, by VTI measures 1.85 cm. Pulmonic Valve: The pulmonic valve was not well visualized. Pulmonic valve regurgitation is not visualized. No evidence of pulmonic stenosis. Aorta: The aortic root, ascending aorta, aortic arch and descending aorta are all structurally normal, with no evidence of dilitation or obstruction. Venous: The inferior vena cava is normal in size with less than 50% respiratory variability, suggesting right atrial pressure of 8 mmHg. IAS/Shunts: The interatrial septum was not well visualized.  LEFT VENTRICLE PLAX 2D LVIDd:         4.40 cm   Diastology LVIDs:         3.70 cm    LV e' medial:    7.30 cm/s LV PW:         1.00 cm   LV E/e' medial:  16.0 LV IVS:        1.15 cm   LV e' lateral:   9.81 cm/s LVOT diam:     1.90 cm   LV E/e' lateral: 11.9 LV SV:         40 LV SV Index:   19 LVOT Area:     2.84 cm  RIGHT VENTRICLE             IVC RV Basal diam:  3.10 cm     IVC diam: 2.00 cm RV Mid diam:    2.70 cm RV S prime:     12.90 cm/s TAPSE (M-mode): 1.9 cm LEFT ATRIUM             Index        RIGHT ATRIUM           Index LA diam:        4.20 cm 2.00 cm/m   RA Area:     11.60 cm LA Vol (A2C):   43.8 ml 20.83 ml/m  RA Volume:   22.30 ml  10.61 ml/m LA Vol (A4C):   33.5 ml 15.93 ml/m LA Biplane Vol: 38.9 ml 18.50 ml/m  AORTIC VALVE                    PULMONIC VALVE AV Area (Vmax):    1.98 cm     PV Vmax:       0.98 m/s AV Area (Vmean):   1.93 cm     PV Peak grad:  3.9 mmHg AV Area (VTI):     1.85 cm AV Vmax:           134.00 cm/s AV Vmean:          90.500 cm/s AV VTI:  0.218 m AV Peak Grad:      7.2 mmHg AV Mean Grad:      4.0 mmHg LVOT Vmax:         93.50 cm/s LVOT Vmean:        61.600 cm/s LVOT VTI:          0.142 m LVOT/AV VTI ratio: 0.65  AORTA Ao Root diam: 2.80 cm MITRAL VALVE                TRICUSPID VALVE MV Area (PHT): 3.65 cm     TR Peak grad:   18.3 mmHg MV Decel Time: 208 msec     TR Vmax:        214.00 cm/s MV E velocity: 117.00 cm/s MV A velocity: 37.50 cm/s   SHUNTS MV E/A ratio:  3.12         Systemic VTI:  0.14 m                             Systemic Diam: 1.90 cm Buford Dresser MD Electronically signed by Buford Dresser MD Signature Date/Time: 04/04/2022/11:53:33 AM    Final         Scheduled Meds:  clopidogrel  75 mg Oral Daily   furosemide  40 mg Intravenous BID   insulin aspart  0-15 Units Subcutaneous TID WC   insulin aspart  0-5 Units Subcutaneous QHS   insulin glargine-yfgn  8 Units Subcutaneous QHS   metoprolol tartrate  50 mg Oral BID   nicotine  21 mg Transdermal Daily   rosuvastatin  20 mg Oral Daily   [START ON  04/06/2022] sertraline  50 mg Oral Daily   sodium chloride flush  3 mL Intravenous Q12H   tamsulosin  0.4 mg Oral Daily   valproic acid  500 mg Oral BID   warfarin  5 mg Oral ONCE-1600   Warfarin - Pharmacist Dosing Inpatient   Does not apply q1600   Continuous Infusions:  sodium chloride     amiodarone 30 mg/hr (04/05/22 1240)   heparin 1,900 Units/hr (04/05/22 0904)     LOS: 3 days    Time spent: 45 minutes spent on chart review, discussion with nursing staff, consultants, updating family and interview/physical exam; more than 50% of that time was spent in counseling and/or coordination of care.    Phillips Climes, MD [facility] Available via Epic secure chat 7am-7pm After these hours, please refer to coverage provider listed on [email or web address] 04/05/2022, 3:11 PM

## 2022-04-05 NOTE — Progress Notes (Addendum)
Progress Note  Patient Name: Dennis [name] Date of Encounter: 04/05/2022  Primary Cardiologist: Dennis Dolly, MD  Subjective   Overnight events noted in nursing note: "Pt has made numerous attempts to get out of bed, removed 2 IVs, & verbally aggressive towards staff. Foley removed & pt voided, unable to measure because pt removed external condom catheter- voided in bed." EKG showed QT prolongation this AM.  He is more oriented this AM, knows he is at [facility], thinks it's 2022. Denies CP, SOB, palpitations. Remains in AF though rate better.  Inpatient Medications    Scheduled Meds:  clopidogrel  75 mg Oral Daily   furosemide  40 mg Intravenous BID   insulin aspart  0-15 Units Subcutaneous TID WC   insulin aspart  0-5 Units Subcutaneous QHS   insulin glargine-yfgn  8 Units Subcutaneous QHS   metoprolol tartrate  25 mg Oral BID   nicotine  21 mg Transdermal Daily   QUEtiapine  100 mg Oral Daily   rosuvastatin  20 mg Oral Daily   sertraline  75 mg Oral Daily   sodium chloride flush  3 mL Intravenous Q12H   tamsulosin  0.4 mg Oral Daily   valproic acid  250 mg Oral BID   warfarin  5 mg Oral ONCE-1600   Warfarin - Pharmacist Dosing Inpatient   Does not apply q1600   Continuous Infusions:  sodium chloride     amiodarone 30 mg/hr (04/05/22 0408)   heparin 1,900 Units/hr (04/05/22 0904)   PRN Meds: sodium chloride, acetaminophen, haloperidol lactate, nitroGLYCERIN, ondansetron (ZOFRAN) IV, sodium chloride flush   Vital Signs    Vitals:   04/04/22 1954 04/05/22 0532 04/05/22 0729 04/05/22 0857  BP: 119/68 124/63 (!) 144/81 116/61  Pulse:  (!) 112 (!) 108 (!) 117  Resp: '17 16 13   '$ Temp: 98.2 F (36.8 C) 97.9 F (36.6 C) 97.6 F (36.4 C)   TempSrc: Oral Axillary Axillary   SpO2:  95% 95%   Weight:      Height:        Intake/Output Summary (Last 24 hours) at 04/05/2022 0913 Last data filed at 04/05/2022 0408 Gross per 24 hour  Intake 2310.48 ml  Output 1825 ml   Net 485.48 ml      04/02/2022    5:00 PM 02/24/2022    9:40 AM 01/25/2020    5:10 PM  Last 3 Weights  Weight (lbs) 189 lb 6 oz 163 lb 12.8 oz 220 lb  Weight (kg) 85.9 kg 74.299 kg 99.791 kg     Telemetry    AF 90s-1teens - Personally Reviewed  ECG    atrial fib 105bpm, left axis deviation, prior inferior infarct, nonspecific STTW changes, QT prolongation - estimated 551m - Personally Reviewed  Physical Exam   GEN: No acute distress.  HEENT: Normocephalic, atraumatic, sclera non-icteric. Neck: No JVD or bruits. Cardiac: RRR no murmurs, rubs, or gallops.  Respiratory: Clear to auscultation bilaterally. Breathing is unlabored. GI: Soft, nontender, non-distended, BS +x 4. MS: no deformity. Extremities: No clubbing or cyanosis. No edema. Distal pedal pulses are 2+ and equal bilaterally. Neuro:  AAOx3. Follows commands. Psych:  Responds to questions appropriately with a normal affect.  Labs    High Sensitivity Troponin:   Recent Labs  Lab 04/02/22 1656 04/02/22 1826  TROPONINIHS 1,335* 1,635*      Cardiac EnzymesNo results for input(s): "TROPONINI" in the last 168 hours. No results for input(s): "TROPIPOC" in the last 168 hours.  Chemistry Recent Labs  Lab 04/02/22 1656 04/02/22 2217 04/03/22 0326 04/04/22 0346  NA 138 136 139 139  K 5.8* 6.0* 5.0 3.9  CL 106 103 110 105  CO2 18* 20* 21* 27  GLUCOSE 246* 256* 108* 118*  BUN 54* 55* 54* 50*  CREATININE 2.17* 2.14* 2.04* 1.92*  CALCIUM 8.6* 8.3* 8.3* 8.2*  PROT 6.8  --   --   --   ALBUMIN 2.9*  --   --   --   AST 26  --   --   --   ALT 29  --   --   --   ALKPHOS 78  --   --   --   BILITOT 0.7  --   --   --   GFRNONAA 31* 32* 34* 36*  ANIONGAP '14 13 8 7     '$ Hematology Recent Labs  Lab 04/03/22 0326 04/04/22 0346 04/05/22 0703  WBC 11.4* 10.7* 8.5  RBC 2.95* 3.09* 3.14*  HGB 8.9* 9.3* 9.6*  HCT 26.6* 28.4* 27.9*  MCV 90.2 91.9 88.9  MCH 30.2 30.1 30.6  MCHC 33.5 32.7 34.4  RDW 13.3 13.3 13.1   PLT 242 269 260    BNP Recent Labs  Lab 04/02/22 1656  BNP 1,319.0*     DDimer No results for input(s): "DDIMER" in the last 168 hours.   Radiology    ECHOCARDIOGRAM COMPLETE  Result Date: 04/04/2022    ECHOCARDIOGRAM REPORT   Patient Name:   Dennis [name] Date of Exam: 04/04/2022 Medical Rec #:  [id number]       Height:       73.0 in Accession #:    [id number]      Weight:       189.4 lb Date of Birth:  1946-06-23       BSA:          2.102 m Patient Age:    12 years        BP:           127/65 mmHg Patient Gender: M               HR:           104 bpm. Exam Location:  Inpatient Procedure: 2D Echo, Cardiac Doppler and Color Doppler Indications:    Chest pain  History:        Patient has prior history of Echocardiogram examinations, most                 recent 11/14/2017. CAD, PAD, Arrythmias:Atrial Fibrillation; Risk                 Factors:Hypertension and HLD. Dementia, Tobacco dependence.  Sonographer:    Dennis [name] RCS Referring Phys: [place]  1. Left ventricular ejection fraction, by estimation, is 50 to 55%. The left ventricle has low normal function. The left ventricle demonstrates regional wall motion abnormalities (see scoring diagram/findings for description). Left ventricular diastolic  function could not be evaluated. There is mild hypokinesis of the left ventricular, entire anteroseptal wall and anterior wall.  2. Right ventricular systolic function is normal. The right ventricular size is normal. There is normal pulmonary artery systolic pressure.  3. A small pericardial effusion is present.  4. The mitral valve is grossly normal. Trivial mitral valve regurgitation. No evidence of mitral stenosis.  5. The aortic valve is grossly normal. Aortic valve regurgitation is not visualized. No aortic stenosis is present.  6.  The inferior vena cava is normal in size with <50% respiratory variability, suggesting right atrial pressure of 8 mmHg. Comparison(s): Changes  from prior study are noted. Conclusion(s)/Recommendation(s): Technically challenging study, as best images are subcostal. Overall low normal EF. Difficult to assess regional wall motion, but there is mild hypokinesis in the visualized portions of the anteroseptal and anterior wall.  In atrial fibrillation with RVR throughout study. FINDINGS  Left Ventricle: Left ventricular ejection fraction, by estimation, is 50 to 55%. The left ventricle has low normal function. The left ventricle demonstrates regional wall motion abnormalities. Mild hypokinesis of the left ventricular, entire anteroseptal wall and anterior wall. The left ventricular internal cavity size was normal in size. There is no left ventricular hypertrophy. Left ventricular diastolic function could not be evaluated due to atrial fibrillation. Left ventricular diastolic  function could not be evaluated. Right Ventricle: The right ventricular size is normal. Right vetricular wall thickness was not well visualized. Right ventricular systolic function is normal. There is normal pulmonary artery systolic pressure. The tricuspid regurgitant velocity is 2.14 m/s, and with an assumed right atrial pressure of 8 mmHg, the estimated right ventricular systolic pressure is 75.6 mmHg. Left Atrium: Left atrial size was not well visualized. Right Atrium: Right atrial size was not well visualized. Pericardium: A small pericardial effusion is present. Mitral Valve: The mitral valve is grossly normal. There is mild thickening of the mitral valve leaflet(s). There is mild calcification of the mitral valve leaflet(s). Trivial mitral valve regurgitation. No evidence of mitral valve stenosis. Tricuspid Valve: The tricuspid valve is normal in structure. Tricuspid valve regurgitation is trivial. No evidence of tricuspid stenosis. Aortic Valve: The aortic valve is grossly normal. Aortic valve regurgitation is not visualized. No aortic stenosis is present. Aortic valve mean gradient  measures 4.0 mmHg. Aortic valve peak gradient measures 7.2 mmHg. Aortic valve area, by VTI measures 1.85 cm. Pulmonic Valve: The pulmonic valve was not well visualized. Pulmonic valve regurgitation is not visualized. No evidence of pulmonic stenosis. Aorta: The aortic root, ascending aorta, aortic arch and descending aorta are all structurally normal, with no evidence of dilitation or obstruction. Venous: The inferior vena cava is normal in size with less than 50% respiratory variability, suggesting right atrial pressure of 8 mmHg. IAS/Shunts: The interatrial septum was not well visualized.  LEFT VENTRICLE PLAX 2D LVIDd:         4.40 cm   Diastology LVIDs:         3.70 cm   LV e' medial:    7.30 cm/s LV PW:         1.00 cm   LV E/e' medial:  16.0 LV IVS:        1.15 cm   LV e' lateral:   9.81 cm/s LVOT diam:     1.90 cm   LV E/e' lateral: 11.9 LV SV:         40 LV SV Index:   19 LVOT Area:     2.84 cm  RIGHT VENTRICLE             IVC RV Basal diam:  3.10 cm     IVC diam: 2.00 cm RV Mid diam:    2.70 cm RV S prime:     12.90 cm/s TAPSE (M-mode): 1.9 cm LEFT ATRIUM             Index        RIGHT ATRIUM           Index  LA diam:        4.20 cm 2.00 cm/m   RA Area:     11.60 cm LA Vol (A2C):   43.8 ml 20.83 ml/m  RA Volume:   22.30 ml  10.61 ml/m LA Vol (A4C):   33.5 ml 15.93 ml/m LA Biplane Vol: 38.9 ml 18.50 ml/m  AORTIC VALVE                    PULMONIC VALVE AV Area (Vmax):    1.98 cm     PV Vmax:       0.98 m/s AV Area (Vmean):   1.93 cm     PV Peak grad:  3.9 mmHg AV Area (VTI):     1.85 cm AV Vmax:           134.00 cm/s AV Vmean:          90.500 cm/s AV VTI:            0.218 m AV Peak Grad:      7.2 mmHg AV Mean Grad:      4.0 mmHg LVOT Vmax:         93.50 cm/s LVOT Vmean:        61.600 cm/s LVOT VTI:          0.142 m LVOT/AV VTI ratio: 0.65  AORTA Ao Root diam: 2.80 cm MITRAL VALVE                TRICUSPID VALVE MV Area (PHT): 3.65 cm     TR Peak grad:   18.3 mmHg MV Decel Time: 208 msec     TR Vmax:         214.00 cm/s MV E velocity: 117.00 cm/s MV A velocity: 37.50 cm/s   SHUNTS MV E/A ratio:  3.12         Systemic VTI:  0.14 m                             Systemic Diam: 1.90 cm Buford Dresser MD Electronically signed by Buford Dresser MD Signature Date/Time: 04/04/2022/11:53:33 AM    Final     Cardiac Studies   2D echo 04/04/22   1. Left ventricular ejection fraction, by estimation, is 50 to 55%. The  left ventricle has low normal function. The left ventricle demonstrates  regional wall motion abnormalities (see scoring diagram/findings for  description). Left ventricular diastolic   function could not be evaluated. There is mild hypokinesis of the left  ventricular, entire anteroseptal wall and anterior wall.   2. Right ventricular systolic function is normal. The right ventricular  size is normal. There is normal pulmonary artery systolic pressure.   3. A small pericardial effusion is present.   4. The mitral valve is grossly normal. Trivial mitral valve  regurgitation. No evidence of mitral stenosis.   5. The aortic valve is grossly normal. Aortic valve regurgitation is not  visualized. No aortic stenosis is present.   6. The inferior vena cava is normal in size with <50% respiratory  variability, suggesting right atrial pressure of 8 mmHg.   Comparison(s): Changes from prior study are noted.   Conclusion(s)/Recommendation(s): Technically challenging study, as best  images are subcostal. Overall low normal EF. Difficult to assess regional  wall motion, but there is mild hypokinesis in the visualized portions of  the anteroseptal and anterior wall.   In atrial fibrillation with RVR throughout study.  Patient Profile     76 y.o. 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 Found to have elevated troponin and a/c CHF.  Assessment & Plan    1. CAD, HLD here with suspected NSTEMI - troponin peak here 1,635 - initially there was question of possible cath but he has demonstrated poor candidacy for such, primarily due to dementia with intermittent agitation, pulling at lines, requiring restraints. Additionally is a poor candidate given CKD, anemia, sacral pressure ulcer, urinary retention, and overall poor functional status - Plavix '75mg'$  daily added as part of medical therapy (will remain off ASA given concomitant Coumadin) - titrate metoprolol - rosuvastatin titrated this admission from 5 to '20mg'$  daily for LDL 72 - if the patient is tolerating statin at time of follow-up appointment, would consider rechecking liver function/lipid panel in 6-8 weeks   2. Acute on chronic diastolic CHF, pleural effusions - found have BNP 1319 on admission here with CXR with bilateral pleural effusions and pulmonary edema  - repeat echo stable with EF 50-55%, + WMA, small pericardial effusion - IV Lasix started 7/16 -> will order daily weights; so far -1.1 net with -1.8L UOP yesterday - continue diuresis - exam is consistent with continued left pleural effusion - will get repeat 2V CXR - would recommend IM team consider thoracentesis if indicated based on exam   3. Paroxysmal atrial fibrillation, on home Coumadin - was NSR on arrival here to [facility] but then went into AF RVR 04/03/22 - - TSH mildly elevated, FT4 wnl - INR reported to be 6.9 at OSH prior to arrival -  on warfarin at home which was on hold with initiation of heparin for cath which was then transitioned back on board 7/17 after non-invasive strategy was recommended - digoxin stopped on admission, level 0.9, suspect stopped for renal insufficiency - IV amiodarone  initiated 04/04/22 but with some QT prolongation noted on telemetry this morning, HR 90s-low 1teens - per d/w Dr. Margaretann Loveless, continue IV amiodarone, titrate metoprolol to '50mg'$  BID, replete potassium  4. QT prolongation - 52m on EKG overnight, will repeat this AM - reviewed with Dr. AMargaretann Loveless- replete potassium, continue IV amiodarone cautiously, repeat EKG in AM - will discontinue Haldol- recommend internal med review psych meds and advise if able to alter any of the dosing for QT prolongation  5. AKI on CKD stage 2 - prior OP Cr by outside labs was 1.0-1.3, admitted with 1.9-2 range  - remains plateaued, follow   6. Carotid artery stenosis - 411-91%RICA, 647-82%LICA in 19/5621- recommend close OP f/u with his primary vascular team   7. Diabetes mellitus - per IM   8. Chronic appearing anemia - per IM   9. Cognitive deficits with intermittent agitation - suspected of dementia; further per primary team - [place]   For questions or updates, please contact CNorth ConwayPlease consult [email or web address] for contact info under Cardiology/STEMI.  Signed, DCharlie Pitter PA-C 04/05/2022, 9:13 AM    Patient seen and examined with DMelina CopaPA-C.  Agree as above, with the following exceptions and changes as noted below. No CP or SOB, no palpitations.  Gen: NAD, CV: iRRR, no murmurs, Lungs: clear, Abd: soft, Extrem: Warm, well perfused, no edema, Neuro/Psych: oriented x 2. All available labs, radiology testing, previous records reviewed. Rates improved on amiodarone however will use cautiously with QT prolongation, avoid additional haldol if possible. Correct potassium- underway. Recommend goals of care discussion.   Elouise Munroe, MD 04/05/22 9:48 AM

## 2022-04-05 NOTE — Progress Notes (Signed)
Pt has made numerous attempts to get out of bed, removed 2 IVs, & verbally aggressive towards staff. Foley removed & pt voided, unable to measure because pt removed external condom catheter- voided in bed. 12-lead EKG performed that resulted in a prolonged Qtc measurement. Pt reoriented & currently calm, will continue to monitor.  Elaina Hoops, RN

## 2022-04-05 NOTE — Progress Notes (Signed)
[place] for warfarin and heparin Indication: atrial fibrillation  Allergies  Allergen Reactions   Betadine [Povidone Iodine] Swelling    Facial swelling   Fish Allergy Swelling    Facial swelling. Many years ago.   Iodine Swelling    Facial swelling   Shellfish Allergy Swelling    Facial swelling   Sulfamethoxazole-Trimethoprim Swelling    Facial swelling   Gabapentin Other (See Comments)    Unknown reaction   Chlorhexidine    Trimethoprim Swelling    Facial swelling - Doesn't think he ever took this by itself.   Tramadol Other (See Comments)    Makes him crazy    Patient Measurements: Height: '6\' 1"'$  (185.4 cm) Weight: 85.9 kg (189 lb 6 oz) IBW/kg (Calculated) : 79.9  Vital Signs: Temp: 97.6 F (36.4 C) (07/18 0729) Temp Source: Axillary (07/18 0729) BP: 144/81 (07/18 0729) Pulse Rate: 108 (07/18 0729)  Labs: Recent Labs    04/02/22 1656 04/02/22 1656 04/02/22 1826 04/02/22 2217 04/03/22 0326 04/03/22 1246 04/04/22 0346 04/04/22 1404 04/05/22 0703  HGB  --    < >  --  9.2* 8.9*  --  9.3*  --  9.6*  HCT  --    < >  --  26.9* 26.6*  --  28.4*  --  27.9*  PLT  --    < >  --  252 242  --  269  --  260  LABPROT 20.6*  --   --   --   --   --  17.9*  --  20.0*  INR 1.8*  --   --   --   --   --  1.5*  --  1.7*  HEPARINUNFRC  --   --   --   --  <0.10*   < > 0.12* 0.19* 0.32  CREATININE 2.17*  --   --  2.14* 2.04*  --  1.92*  --   --   TROPONINIHS 1,335*  --  1,635*  --   --   --   --   --   --    < > = values in this interval not displayed.     Estimated Creatinine Clearance: 38.1 mL/min (A) (by C-G formula based on SCr of 1.92 mg/dL (H)).   Medical History: Past Medical History:  Diagnosis Date   Arthritis    CAD (coronary artery disease)    a. s/p prior stenting of RCA and OM2 in 2006 b. s/p STEMI in 10/2017 with stenting of mid-RCA and PDA with DES x2   Chronic pain    Diabetes mellitus ([place])    Hyperlipidemia     Hypertension    STEMI (ST elevation myocardial infarction) ([place])    Tobacco use     Assessment: 76 y/o male presented with chest pain to OSH ([place]) and transferred to [facility]. Pt on warfarin PTA for AFib which was held on admit for possible cath.  Warfarin resumed and INR is subtherapeutic at 1.7, heparin level is therapeutic at 0.32. CBC stable, no s/sx of bleeding.  Home warfarin '5mg'$  MWF, 2.'5mg'$  AODs.    Goal of Therapy:  Anti-Xa 0.3-0.7 units/mL INR 2-3 Monitor platelets by anticoagulation protocol: Yes   Plan:  Heparin gtt 1900 units/h Warfarin '5mg'$  x1 Recheck heparin level and INR with am levels  Louanne Belton, PharmD, [facility] PGY1 Pharmacy Resident 04/05/2022 8:38 AM

## 2022-04-06 DIAGNOSIS — N1831 Chronic kidney disease, stage 3a: Secondary | ICD-10-CM | POA: Diagnosis not present

## 2022-04-06 DIAGNOSIS — Z7189 Other specified counseling: Secondary | ICD-10-CM | POA: Diagnosis not present

## 2022-04-06 DIAGNOSIS — F03918 Unspecified dementia, unspecified severity, with other behavioral disturbance: Secondary | ICD-10-CM | POA: Diagnosis not present

## 2022-04-06 DIAGNOSIS — I249 Acute ischemic heart disease, unspecified: Secondary | ICD-10-CM | POA: Diagnosis not present

## 2022-04-06 LAB — CBC
HCT: 28.8 % — ABNORMAL LOW (ref 39.0–52.0)
Hemoglobin: 9.7 g/dL — ABNORMAL LOW (ref 13.0–17.0)
MCH: 29.8 pg (ref 26.0–34.0)
MCHC: 33.7 g/dL (ref 30.0–36.0)
MCV: 88.6 fL (ref 80.0–100.0)
Platelets: 281 10*3/uL (ref 150–400)
RBC: 3.25 MIL/uL — ABNORMAL LOW (ref 4.22–5.81)
RDW: 13.2 % (ref 11.5–15.5)
WBC: 7.9 10*3/uL (ref 4.0–10.5)
nRBC: 0 % (ref 0.0–0.2)

## 2022-04-06 LAB — PROTIME-INR
INR: 2.1 — ABNORMAL HIGH (ref 0.8–1.2)
Prothrombin Time: 23.3 seconds — ABNORMAL HIGH (ref 11.4–15.2)

## 2022-04-06 LAB — BASIC METABOLIC PANEL
Anion gap: 11 (ref 5–15)
BUN: 45 mg/dL — ABNORMAL HIGH (ref 8–23)
CO2: 26 mmol/L (ref 22–32)
Calcium: 8.2 mg/dL — ABNORMAL LOW (ref 8.9–10.3)
Chloride: 102 mmol/L (ref 98–111)
Creatinine, Ser: 2.3 mg/dL — ABNORMAL HIGH (ref 0.61–1.24)
GFR, Estimated: 29 mL/min — ABNORMAL LOW (ref >60)
Glucose, Bld: 111 mg/dL — ABNORMAL HIGH (ref 70–99)
Potassium: 3.7 mmol/L (ref 3.5–5.1)
Sodium: 139 mmol/L (ref 135–145)

## 2022-04-06 LAB — HEPARIN LEVEL (UNFRACTIONATED): Heparin Unfractionated: 0.46 IU/mL (ref 0.30–0.70)

## 2022-04-06 LAB — GLUCOSE, CAPILLARY
Glucose-Capillary: 136 mg/dL — ABNORMAL HIGH (ref 70–99)
Glucose-Capillary: 118 mg/dL — ABNORMAL HIGH (ref 70–99)
Glucose-Capillary: 173 mg/dL — ABNORMAL HIGH (ref 70–99)
Glucose-Capillary: 131 mg/dL — ABNORMAL HIGH (ref 70–99)

## 2022-04-06 LAB — MAGNESIUM: Magnesium: 2.1 mg/dL (ref 1.7–2.4)

## 2022-04-06 MED ORDER — WARFARIN SODIUM 2.5 MG PO TABS
2.5000 mg | ORAL_TABLET | Freq: Once | ORAL | Status: AC
  Administered 2022-04-06: 2.5 mg via ORAL
  Filled 2022-04-06: qty 1

## 2022-04-06 MED ORDER — POTASSIUM CHLORIDE CRYS ER 10 MEQ PO TBCR
10.0000 meq | EXTENDED_RELEASE_TABLET | Freq: Once | ORAL | Status: AC
  Administered 2022-04-06: 10 meq via ORAL
  Filled 2022-04-06: qty 1

## 2022-04-06 NOTE — Progress Notes (Addendum)
Progress Note  Patient Name: Dennis [name] Date of Encounter: 04/06/2022  Primary Cardiologist: Carlyle Dolly, MD  Subjective   Feeling better this morning. Lying flat without any difficulty. No CP or SOB.  More oriented today than previously.   Inpatient Medications    Scheduled Meds:  clopidogrel  75 mg Oral Daily   insulin aspart  0-15 Units Subcutaneous TID WC   insulin aspart  0-5 Units Subcutaneous QHS   insulin glargine-yfgn  8 Units Subcutaneous QHS   metoprolol tartrate  50 mg Oral BID   nicotine  21 mg Transdermal Daily   rosuvastatin  20 mg Oral Daily   sertraline  50 mg Oral Daily   sodium chloride flush  3 mL Intravenous Q12H   tamsulosin  0.4 mg Oral Daily   valproic acid  500 mg Oral BID   warfarin  2.5 mg Oral ONCE-1600   Warfarin - Pharmacist Dosing Inpatient   Does not apply q1600   Continuous Infusions:  sodium chloride     amiodarone 30 mg/hr (04/06/22 0039)   PRN Meds: sodium chloride, acetaminophen, nitroGLYCERIN, ondansetron (ZOFRAN) IV, sodium chloride flush   Vital Signs    Vitals:   04/05/22 1933 04/06/22 0319 04/06/22 0533 04/06/22 0758  BP: 105/62 (!) 97/56 96/73 108/62  Pulse: 95  85 94  Resp: '19 14 19 12  '$ Temp: 97.8 F (36.6 C) 98.4 F (36.9 C)  98 F (36.7 C)  TempSrc: Oral Oral  Oral  SpO2: 95% 93%  93%  Weight:  79 kg    Height:        Intake/Output Summary (Last 24 hours) at 04/06/2022 0806 Last data filed at 04/06/2022 0531 Gross per 24 hour  Intake 852.79 ml  Output 1400 ml  Net -547.21 ml      04/06/2022    3:19 AM 04/02/2022    5:00 PM 02/24/2022    9:40 AM  Last 3 Weights  Weight (lbs) 174 lb 2.6 oz 189 lb 6 oz 163 lb 12.8 oz  Weight (kg) 79 kg 85.9 kg 74.299 kg     Telemetry    Atrial fib rates 90s-low 100s - Personally Reviewed  ECG    Atrial fibrillation 96bpm, prior inferior infarct, nonspecific STTW changes, visually QTc still appears somewhat prolonged but improved, measuring 414m - Personally  Reviewed  Physical Exam   GEN: No acute distress.  HEENT: Normocephalic, atraumatic, sclera non-icteric. Neck: No JVD or bruits. Cardiac: Irregularly irregular, tachycardic, no murmurs, rubs, or gallops.  Respiratory: Improved aeration - mildly diminished BS at bases. No wheezing, rales or rhonchi. Breathing is unlabored. GI: Soft, nontender, non-distended, BS +x 4. MS: no deformity. Extremities: No clubbing or cyanosis. No edema. S/p R AKA Neuro:  AAOx3. Follows commands. Psych:  Responds to questions appropriately with a normal affect.  Labs    High Sensitivity Troponin:   Recent Labs  Lab 04/02/22 1656 04/02/22 1826  TROPONINIHS 1,335* 1,635*      Cardiac EnzymesNo results for input(s): "TROPONINI" in the last 168 hours. No results for input(s): "TROPIPOC" in the last 168 hours.   Chemistry Recent Labs  Lab 04/02/22 1656 04/02/22 2217 04/04/22 0346 04/05/22 0703 04/06/22 0315  NA 138   < > 139 141 139  K 5.8*   < > 3.9 3.2* 3.7  CL 106   < > 105 100 102  CO2 18*   < > '27 25 26  '$ GLUCOSE 246*   < > 118* 107* 111*  BUN  54*   < > 50* 46* 45*  CREATININE 2.17*   < > 1.92* 2.08* 2.30*  CALCIUM 8.6*   < > 8.2* 8.2* 8.2*  PROT 6.8  --   --   --   --   ALBUMIN 2.9*  --   --   --   --   AST 26  --   --   --   --   ALT 29  --   --   --   --   ALKPHOS 78  --   --   --   --   BILITOT 0.7  --   --   --   --   GFRNONAA 31*   < > 36* 33* 29*  ANIONGAP 14   < > 7 16* 11   < > = values in this interval not displayed.     Hematology Recent Labs  Lab 04/04/22 0346 04/05/22 0703 04/06/22 0315  WBC 10.7* 8.5 7.9  RBC 3.09* 3.14* 3.25*  HGB 9.3* 9.6* 9.7*  HCT 28.4* 27.9* 28.8*  MCV 91.9 88.9 88.6  MCH 30.1 30.6 29.8  MCHC 32.7 34.4 33.7  RDW 13.3 13.1 13.2  PLT 269 260 281    BNP Recent Labs  Lab 04/02/22 1656  BNP 1,319.0*     DDimer No results for input(s): "DDIMER" in the last 168 hours.   Radiology    DG Chest 2 View  Result Date:  04/05/2022 CLINICAL DATA:  Shortness of breath.  History of pleural effusion. EXAM: CHEST - 2 VIEW COMPARISON:  04/02/2022 FINDINGS: Stable cardiac enlargement. There is a moderate right pleural effusion with near complete atelectasis of the right lower lobe. Small left pleural effusion identified. No interstitial edema. IMPRESSION: 1. Moderate right pleural effusion and small left pleural effusion. 2. Right lower lobe atelectasis. Electronically Signed   By: Kerby Moors M.D.   On: 04/05/2022 11:24   ECHOCARDIOGRAM COMPLETE  Result Date: 04/04/2022    ECHOCARDIOGRAM REPORT   Patient Name:   Dennis [name] Date of Exam: 04/04/2022 Medical Rec #:  [id number]       Height:       73.0 in Accession #:    [id number]      Weight:       189.4 lb Date of Birth:  11-12-1945       BSA:          2.102 m Patient Age:    76 years        BP:           127/65 mmHg Patient Gender: M               HR:           104 bpm. Exam Location:  Inpatient Procedure: 2D Echo, Cardiac Doppler and Color Doppler Indications:    Chest pain  History:        Patient has prior history of Echocardiogram examinations, most                 recent 11/14/2017. CAD, PAD, Arrythmias:Atrial Fibrillation; Risk                 Factors:Hypertension and HLD. Dementia, Tobacco dependence.  Sonographer:    Joette Catching RCS Referring Phys: [place]  1. Left ventricular ejection fraction, by estimation, is 50 to 55%. The left ventricle has low normal function. The left ventricle demonstrates regional wall motion abnormalities (see scoring diagram/findings for  description). Left ventricular diastolic  function could not be evaluated. There is mild hypokinesis of the left ventricular, entire anteroseptal wall and anterior wall.  2. Right ventricular systolic function is normal. The right ventricular size is normal. There is normal pulmonary artery systolic pressure.  3. A small pericardial effusion is present.  4. The mitral valve is  grossly normal. Trivial mitral valve regurgitation. No evidence of mitral stenosis.  5. The aortic valve is grossly normal. Aortic valve regurgitation is not visualized. No aortic stenosis is present.  6. The inferior vena cava is normal in size with <50% respiratory variability, suggesting right atrial pressure of 8 mmHg. Comparison(s): Changes from prior study are noted. Conclusion(s)/Recommendation(s): Technically challenging study, as best images are subcostal. Overall low normal EF. Difficult to assess regional wall motion, but there is mild hypokinesis in the visualized portions of the anteroseptal and anterior wall.  In atrial fibrillation with RVR throughout study. FINDINGS  Left Ventricle: Left ventricular ejection fraction, by estimation, is 50 to 55%. The left ventricle has low normal function. The left ventricle demonstrates regional wall motion abnormalities. Mild hypokinesis of the left ventricular, entire anteroseptal wall and anterior wall. The left ventricular internal cavity size was normal in size. There is no left ventricular hypertrophy. Left ventricular diastolic function could not be evaluated due to atrial fibrillation. Left ventricular diastolic  function could not be evaluated. Right Ventricle: The right ventricular size is normal. Right vetricular wall thickness was not well visualized. Right ventricular systolic function is normal. There is normal pulmonary artery systolic pressure. The tricuspid regurgitant velocity is 2.14 m/s, and with an assumed right atrial pressure of 8 mmHg, the estimated right ventricular systolic pressure is 27.2 mmHg. Left Atrium: Left atrial size was not well visualized. Right Atrium: Right atrial size was not well visualized. Pericardium: A small pericardial effusion is present. Mitral Valve: The mitral valve is grossly normal. There is mild thickening of the mitral valve leaflet(s). There is mild calcification of the mitral valve leaflet(s). Trivial mitral  valve regurgitation. No evidence of mitral valve stenosis. Tricuspid Valve: The tricuspid valve is normal in structure. Tricuspid valve regurgitation is trivial. No evidence of tricuspid stenosis. Aortic Valve: The aortic valve is grossly normal. Aortic valve regurgitation is not visualized. No aortic stenosis is present. Aortic valve mean gradient measures 4.0 mmHg. Aortic valve peak gradient measures 7.2 mmHg. Aortic valve area, by VTI measures 1.85 cm. Pulmonic Valve: The pulmonic valve was not well visualized. Pulmonic valve regurgitation is not visualized. No evidence of pulmonic stenosis. Aorta: The aortic root, ascending aorta, aortic arch and descending aorta are all structurally normal, with no evidence of dilitation or obstruction. Venous: The inferior vena cava is normal in size with less than 50% respiratory variability, suggesting right atrial pressure of 8 mmHg. IAS/Shunts: The interatrial septum was not well visualized.  LEFT VENTRICLE PLAX 2D LVIDd:         4.40 cm   Diastology LVIDs:         3.70 cm   LV e' medial:    7.30 cm/s LV PW:         1.00 cm   LV E/e' medial:  16.0 LV IVS:        1.15 cm   LV e' lateral:   9.81 cm/s LVOT diam:     1.90 cm   LV E/e' lateral: 11.9 LV SV:         40 LV SV Index:   19 LVOT Area:  2.84 cm  RIGHT VENTRICLE             IVC RV Basal diam:  3.10 cm     IVC diam: 2.00 cm RV Mid diam:    2.70 cm RV S prime:     12.90 cm/s TAPSE (M-mode): 1.9 cm LEFT ATRIUM             Index        RIGHT ATRIUM           Index LA diam:        4.20 cm 2.00 cm/m   RA Area:     11.60 cm LA Vol (A2C):   43.8 ml 20.83 ml/m  RA Volume:   22.30 ml  10.61 ml/m LA Vol (A4C):   33.5 ml 15.93 ml/m LA Biplane Vol: 38.9 ml 18.50 ml/m  AORTIC VALVE                    PULMONIC VALVE AV Area (Vmax):    1.98 cm     PV Vmax:       0.98 m/s AV Area (Vmean):   1.93 cm     PV Peak grad:  3.9 mmHg AV Area (VTI):     1.85 cm AV Vmax:           134.00 cm/s AV Vmean:          90.500 cm/s AV VTI:             0.218 m AV Peak Grad:      7.2 mmHg AV Mean Grad:      4.0 mmHg LVOT Vmax:         93.50 cm/s LVOT Vmean:        61.600 cm/s LVOT VTI:          0.142 m LVOT/AV VTI ratio: 0.65  AORTA Ao Root diam: 2.80 cm MITRAL VALVE                TRICUSPID VALVE MV Area (PHT): 3.65 cm     TR Peak grad:   18.3 mmHg MV Decel Time: 208 msec     TR Vmax:        214.00 cm/s MV E velocity: 117.00 cm/s MV A velocity: 37.50 cm/s   SHUNTS MV E/A ratio:  3.12         Systemic VTI:  0.14 m                             Systemic Diam: 1.90 cm Buford Dresser MD Electronically signed by Buford Dresser MD Signature Date/Time: 04/04/2022/11:53:33 AM    Final     Cardiac Studies   2D echo 04/04/22   1. Left ventricular ejection fraction, by estimation, is 50 to 55%. The  left ventricle has low normal function. The left ventricle demonstrates  regional wall motion abnormalities (see scoring diagram/findings for  description). Left ventricular diastolic   function could not be evaluated. There is mild hypokinesis of the left  ventricular, entire anteroseptal wall and anterior wall.   2. Right ventricular systolic function is normal. The right ventricular  size is normal. There is normal pulmonary artery systolic pressure.   3. A small pericardial effusion is present.   4. The mitral valve is grossly normal. Trivial mitral valve  regurgitation. No evidence of mitral stenosis.   5. The aortic valve is grossly normal. Aortic valve regurgitation is not  visualized. No aortic stenosis is present.   6. The inferior vena cava is normal in size with <50% respiratory  variability, suggesting right atrial pressure of 8 mmHg.   Comparison(s): Changes from prior study are noted.   Conclusion(s)/Recommendation(s): Technically challenging study, as best  images are subcostal. Overall low normal EF. Difficult to assess regional  wall motion, but there is mild hypokinesis in the visualized portions of  the  anteroseptal and anterior wall.   In atrial fibrillation with RVR throughout study.     Patient Profile     76 y.o. 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 Found to have elevated troponin and a/c CHF.  Assessment & Plan    1. CAD, HLD here with suspected NSTEMI - troponin peak here 1,635 - initially there was question of possible cath but he has demonstrated poor candidacy for such, primarily due to dementia with intermittent agitation, pulling at lines, requiring restraints, combative with staff, confusion. Additionally is a poor candidate given CKD, anemia, sacral pressure ulcer, urinary retention, and overall poor functional status - Plavix '75mg'$  daily added as part of medical therapy (will remain off ASA given concomitant Coumadin) - metoprolol titrated - rosuvastatin titrated this admission from 5 to '20mg'$  daily for LDL 72 - if the patient is tolerating statin at time of follow-up appointment, would consider rechecking liver function/lipid panel in 6-8 weeks   2. Acute on chronic diastolic CHF, pleural effusions - found have BNP 1319 on admission here with CXR with bilateral pleural effusions and pulmonary edema  - repeat CXR 04/05/22 with moderate R and small L pleural effusion - repeat echo stable with EF 50-55%, + WMA, small pericardial effusion - IV Lasix started 7/16 -> weight 189->174lb without intermediate values, unclear if I/O's completely accurate - exam reveals no LEE, improved breath sounds, with rising Cr on labs -> will hold Lasix and review with MD   3. Paroxysmal atrial fibrillation, on home Coumadin - was NSR on  arrival here to [facility] but then went into AF RVR 04/03/22 - TSH mildly elevated, FT4 wnl - INR reported to be 6.9 at OSH prior to arrival -  on warfarin at home which was on hold with initiation of heparin for cath which was then transitioned back on board 7/17 after non-invasive strategy was recommended - digoxin stopped on admission, level 0.9, suspect stopped for renal insufficiency - IV amiodarone initiated 04/04/22 for recurrent AF with soft BPs prohibiting aggressive med titration, but with some QT prolongation noted on telemetry - metoprolol titrated 7/19 - today HR 90s-low 100s - will review rate/rhythm control plan with MD - note to primary service: [facility] does not manage his INR so please make sure he has Coumadin follow-up with the team that does manage this at discharge   4. QT prolongation - 567m on EKG 7/19, improved to 486mwith discontinuation of Haldol, repletion of potassium; IM adjusting psych meds as well - K 3.7 -> will give 1042msince Cr 2.3 -> now that we are holding Lasix, may begin to come up on its own - daily EKGs - follow in context of the above  5. AKI on  CKD stage 2 - prior OP Cr by outside labs was 1.0-1.3, admitted with 1.9-2 range  - elevated to 2.3 today, hold Lasix   6. Carotid artery stenosis - 82-06% RICA, 01-56% LICA in 09/5377 - recommend close OP f/u with his primary vascular team   7. Diabetes mellitus - per IM   8. Chronic appearing anemia - per IM   9. Cognitive deficits with intermittent agitation - suspected of dementia; further per primary team - consider [place] consult   I did tentatively make a f/u visit on 7/26 with Ermalinda Barrios in [facility] office (no appt availability in [place] offices), has travelled to [place] before.  For questions or updates, please contact [place] Please consult [email or web address] for contact info under Cardiology/STEMI.  Signed, Charlie Pitter, PA-C 04/06/2022, 8:06 AM    Patient seen and examined with  Melina Copa PA-C.  Agree as above, with the following exceptions and changes as noted below. No chest pain or palpitations, rates better today. Gen: NAD, CV: iRRR, no murmurs, Lungs: clear, Abd: soft, Extrem: Warm, well perfused, no edema, Neuro/Psych: thinks he is in the hair parlour by his home, not oriented to place or situation. All available labs, radiology testing, previous records reviewed. Will continue to medically manage NSTEMI given intermittent confusion and minimal abnormality on echocardiogram which may be chronic or acute. Ok to continue IV amiodarone however would consider transitioning to amiodarone 200 mg BID oral when patient is mentally clear and would continue in the short term.   Elouise Munroe, MD 04/06/22 10:39 AM

## 2022-04-06 NOTE — Consult Note (Signed)
Consultation Note Date: 04/06/2022   Patient Name: Dennis [name]  DOB: 11/26/1945  MRN: [id number]  Age / Sex: 76 y.o., male  PCP: Glenda Chroman, MD Referring Physician: Barb Merino, MD  Reason for Consultation: Goals of care  HPI/Patient Profile: 76 y.o. 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  He was transferred from [facility].  Work-up reveals NSTEMI, elevated troponins, he is a poor candidate for cardiac cath given his dementia he has been intermittently agitated.  He is noted to have sacral pressure ulcers and poor functional status.  Cardiology recommended conservative medical medical management.  He is on an amiodarone drip for A-fib with RVR.  Palliative consulted for goals of care.  Primary Decision Maker OTHER - ex spouse- Dennis [name]  Discussion: Chart reviewed including labs, progress notes from this and current admissions as well as care everywhere, imaging. Evaluated patient.  He was awake and alert.  He was not oriented to place or time.  He could not elicit me his reason for hospitalization.  He related that he was in the hospital because he had just had his leg amputated.  I attempted to educate him regarding his heart issues, he was not receptive to this education.  He is unable to  participate in goals of care conversation.  He was able to tell me that his ex-wife Dennis [name] who assists him with his medical care and would be his primary decision maker he does not have living parents or children.  Prior to this admission he was living at home alone.  He notes that he was losing his car.  When asked what brings him joy he replied with not much.   He freely says he does not think that he can take care of himself at home.  He is agreeable to going back to a rehab nursing facility if needed.  He does recall that he spent time at other rehabs but he did not like it. There were no family members at bedside. I called Dennis [name].  Introduced palliative medicine.  [name] plans to visit hospital tomorrow.  We made a plan to meet at 11 AM tomorrow morning for further discussion regarding patient's goals of care.    SUMMARY OF RECOMMENDATIONS -Continue current plan of care - Patient is currently DNR - Plan to meet with Dennis [name] tomorrow at 11 AM  Code Status/Advance Care Planning: DNR   Prognosis:   Unable to determine  Discharge Planning: To Be Determined  Primary Diagnoses: Present on Admission:  ACS (acute coronary syndrome) (HCC)  Tobacco abuse  PAF (paroxysmal atrial fibrillation) (HCC)  PAD (peripheral artery disease) ([place])  Hypertension  Hyperlipidemia  Dementia with behavioral disturbance (HCC)  Chronic kidney disease, stage 3a ([place])   Review of Systems  Constitutional:  Positive for  activity change.  Respiratory:  Negative for shortness of breath.     Physical Exam Vitals and nursing note reviewed.  Musculoskeletal:     Comments: RAKA  Neurological:     Mental Status: He is disoriented.  Psychiatric:     Comments: Flat affect     Vital Signs: BP 121/72 (BP Location: Left Arm)   Pulse 87   Temp 97.9 F (36.6 C) (Oral)   Resp 20   Ht '6\' 1"'$  (1.854 m)   Wt 79 kg   SpO2 99%   BMI 22.98 kg/m  Pain Scale: 0-10 POSS *See Group Information*: S-Acceptable,Sleep, easy to arouse Pain Score: 0-No pain   SpO2: SpO2: 99 % O2 Device:SpO2: 99 % O2 Flow Rate: .O2 Flow Rate (L/min): 2 L/min  IO: Intake/output summary:  Intake/Output Summary (Last 24 hours) at 04/06/2022 1646 Last data filed at 04/06/2022 0800 Gross per 24 hour  Intake 1306.26 ml  Output 600 ml  Net 706.26 ml    LBM: Last BM Date :  04/05/22 Baseline Weight: Weight: 85.9 kg Most recent weight: Weight: 79 kg       Thank you for this consult. Palliative medicine will continue to follow and assist as needed.   Greater than 50%  of this time was spent counseling and coordinating care related to the above assessment and plan.  Signed by: Mariana Kaufman, AGNP-C Palliative Medicine    Please contact Palliative Medicine Team phone at [phone] for questions and concerns.  For individual provider: See Shea Evans

## 2022-04-06 NOTE — TOC Initial Note (Addendum)
Transition of Care [facility]) - Initial/Assessment Note    Patient Details  Name: Dennis [name] MRN: [id number] Date of Birth: 1946/03/10  Transition of Care [facility]) CM/SW Contact:    Milas Gain, [place] Phone Number: 04/06/2022, 12:22 PM  Clinical Narrative:                  CSW received consult for possible SNF placement at time of discharge. Due to patients current orientation CSW spoke with patients spouse regarding PT recommendation of SNF placement for patient at time of discharge. Patient spouse reports patient comes from home alone. Patients spouse expressed understanding of PT recommendation and is agreeable to SNF placement for patient at time of discharge. Patients spouse gave CSW permission to fax out initial referral near the [place] or [place] area. Patients spouse reports number one choice is [facility] in [place]. CSW discussed insurance authorization process with patients spouse.Patients spouse reports patient has received the COVID vaccines.  No further questions reported at this time. CSW will continue to follow and assist with discharge planning needs.   Update- 1:19pm CSW spoke with Mervin Kung at [facility]. Mervin Kung provided CSW with fax number to facility to fax over initial referral to.CSW faxed over referral for review. CSW awaiting determination. CSW will continue to follow. CSW spoke with Wells Guiles with [facility] who confirmed she will review referral. CSW also called [facility] and LVM. CSW awaiting callback on status of referral. CSW received callback from [place] with [facility] who confirmed she can offer SNF bed for patient. CSW awaiting callback from [facility] on determination.   Update-1:52pm- CSW started insurance authorization for patient reference number # [id number]. CSW will continue to follow.   Expected Discharge Plan: Skilled Nursing Facility Barriers to Discharge: Continued Medical Work  up   Patient Goals and CMS Choice        Expected Discharge Plan and Services Expected Discharge Plan: [place]   Discharge Planning Services: CM Consult Post Acute Care Choice: [place] Living arrangements for the past 2 months: Single Family Home                           HH Arranged: RN, Disease Management, PT, OT, Nurse's Aide HH Agency: Other - See comment ([place]) Date [facility] Agency Contacted: 04/04/22 Time HH Agency Contacted: 24 Representative spoke with at [place]: [place]  Prior Living Arrangements/Services Living arrangements for the past 2 months: [place] with:: Self (However, his ex-wife has been staying with the patient throughout the day.) Patient language and need for interpreter reviewed:: Yes        Need for Family Participation in Patient Care: Yes (Comment) Care giver support system in place?: Yes (comment) Current home services: DME, Home OT, Home PT, Home RN, Homehealth aide (Patient is active with [facility] for PT,OT, RN, Aide. Pt has wheelchair/3n1 with drop handle, lift chair, shower chair to be delivered to the home today. Patient in need of slide board.)    Activities of Daily Living Home Assistive Devices/Equipment: Wheelchair ADL Screening (condition at time of admission) Patient's cognitive ability adequate to safely complete daily activities?: No Is the patient deaf or have difficulty hearing?: No Does the patient have difficulty seeing, even when wearing glasses/contacts?: No Does the patient have difficulty concentrating, remembering, or making decisions?: Yes Patient able to express need for assistance with ADLs?:  No Does the patient have difficulty dressing or bathing?: Yes Independently performs ADLs?: No Communication: Independent Dressing (OT): Needs assistance Is this a change from baseline?: Change from baseline, expected to  last >3 days Grooming: Needs assistance Is this a change from baseline?: Change from baseline, expected to last >3 days Feeding: Independent Bathing: Dependent Is this a change from baseline?: Change from baseline, expected to last >3 days Toileting: Needs assistance Is this a change from baseline?: Change from baseline, expected to last >3days In/Out Bed: Needs assistance Is this a change from baseline?: Change from baseline, expected to last >3 days Walks in Home: Needs assistance Is this a change from baseline?: Change from baseline, expected to last >3 days Does the patient have difficulty walking or climbing stairs?: Yes Weakness of Legs: Left Weakness of Arms/Hands: None  Permission Sought/Granted Permission sought to share information with : Family Supports, Case Freight forwarder, Investment banker, corporate granted to share info w AGENCY: [place]        Emotional Assessment           Psych Involvement: No (comment)  Admission diagnosis:  ACS (acute coronary syndrome) ([place]) [I24.9] Patient Active Problem List   Diagnosis Date Noted   ACS (acute coronary syndrome) (Agency) 04/02/2022   Type 2 diabetes mellitus with complication, with long-term current use of insulin ([place]) 04/02/2022   Dementia with behavioral disturbance ([place]) 04/02/2022   Chronic kidney disease, stage 3a ([place]) 04/02/2022   PAD (peripheral artery disease) ([place]) 07/01/2019   Palpitations 01/23/2018   Chronic anticoagulation 01/23/2018   Chronic back pain 01/23/2018   Atrial fibrillation with RVR ([place])    PAF (paroxysmal atrial fibrillation) ([place]) 12/15/2017   Delirium    Closed right hip fracture ([place]) 12/09/2017   CAD S/P percutaneous coronary angioplasty 12/04/2017   Hypertension 12/04/2017   Hyperlipidemia 11/15/2017   Tobacco abuse 11/13/2017   History of ST elevation myocardial infarction (STEMI) 11/13/2017   Insulin dependent diabetes mellitus with complications 63/33/5456    PCP:  Glenda Chroman, MD Pharmacy:   [place], [place] [address] Phone: [phone] Fax: [phone]  [facility] [address] [place] [address] Phone: [phone] Fax: [place], [place] [facility] [address] [place] [address] Phone: [phone] Fax: [phone]     Social Determinants of Health (SDOH) Interventions    Readmission Risk Interventions    04/04/2022   12:57 PM  Readmission Risk Prevention Plan  Transportation Screening Complete  PCP or Specialist Appt within 3-5 Days Complete  HRI or [place] Complete  Social Work Consult for [place] Planning/Counseling Complete  Palliative Care Screening Not Applicable  Medication Review Press photographer) Referral to Pharmacy

## 2022-04-06 NOTE — NC FL2 (Signed)
[place] LEVEL OF CARE SCREENING TOOL     IDENTIFICATION  Patient Name: Dennis [name] Birthdate: [birth year] Sex: male Admission Date (Current Location): 04/02/2022  [facility] and [place] Number:  Herbalist and Address:  [facility] [employer] [address], [place], [place] [address]      Provider Number: [id number]  Attending Physician Name and Address:  Barb Merino, MD  Relative Name and Phone Number:  [name] [phone]    Current Level of Care: Hospital Recommended Level of Care: [place] Prior Approval Number:    Date Approved/Denied:   PASRR Number:    Discharge Plan: SNF    Current Diagnoses: Patient Active Problem List   Diagnosis Date Noted   ACS (acute coronary syndrome) ([place]) 04/02/2022   Type 2 diabetes mellitus with complication, with long-term current use of insulin ([place]) 04/02/2022   Dementia with behavioral disturbance ([place]) 04/02/2022   Chronic kidney disease, stage 3a ([place]) 04/02/2022   PAD (peripheral artery disease) ([place]) 07/01/2019   Palpitations 01/23/2018   Chronic anticoagulation 01/23/2018   Chronic back pain 01/23/2018   Atrial fibrillation with RVR ([place])    PAF (paroxysmal atrial fibrillation) ([place]) 12/15/2017   Delirium    Closed right hip fracture ([place]) 12/09/2017   CAD S/P percutaneous coronary angioplasty 12/04/2017   Hypertension 12/04/2017   Hyperlipidemia 11/15/2017   Tobacco abuse 11/13/2017   History of ST elevation myocardial infarction (STEMI) 11/13/2017   Insulin dependent diabetes mellitus with complications 94/76/5465    Orientation RESPIRATION BLADDER Height & Weight     Self, Place, Time  Normal Continent Weight: 174 lb 2.6 oz (79 kg) Height:  '6\' 1"'$  (185.4 cm)  BEHAVIORAL SYMPTOMS/MOOD NEUROLOGICAL BOWEL NUTRITION STATUS      Continent Diet (Please see discharge summary)  AMBULATORY STATUS COMMUNICATION OF NEEDS Skin   Limited Assist Verbally Other  (Comment) (Pale,dry,Abrasion,arm,hand,foot,R,L,Ecchymosis,arm,bilateral,non-tenting,wound incision open or dehiced coccyx,foam lift dressing to assess site every shift,clean,dry,intact,daily,Please see additional info)                       Personal Care Assistance Level of Assistance  Bathing, Feeding, Dressing Bathing Assistance: Maximum assistance Feeding assistance: Independent (able to feed self,Cardiac) Dressing Assistance: Maximum assistance     Functional Limitations Info  Sight, Speech, Hearing Sight Info: Impaired (Right eye) Hearing Info: Adequate Speech Info: Adequate    SPECIAL CARE FACTORS FREQUENCY  PT (By licensed PT), OT (By licensed OT)     PT Frequency: 5x min weekly OT Frequency: 5x min weekly            Contractures Contractures Info: Not present    Additional Factors Info  Code Status, Allergies, Insulin Sliding Scale, Psychotropic Code Status Info: DNR Allergies Info: Betadine (povidone Iodine),Fish Allergy,Iodine,Shellfish Allergy,Sulfamethoxazole-trimethoprim,Gabapentin,Chlorhexidine,Trimethoprim,Tramadol Psychotropic Info: sertraline (ZOLOFT) tablet 50 mg daily,valproic acid (DEPAKENE) 250 MG/5ML solution 500 mg 2 times daily Insulin Sliding Scale Info: insulin aspart (novoLOG) injection 0-15 Units 3 times daily with meals,insulin aspart (novoLOG) injection 0-5 Units daily at bedtime,insulin glargine-yfgn [facility]) injection 8 Units daily at bedtime       Current Medications (04/06/2022):  This is the current hospital active medication list Current Facility-Administered Medications  Medication Dose Route Frequency Provider Last Rate Last Admin   0.9 %  sodium chloride infusion  250 mL Intravenous PRN Karmen Bongo, MD       acetaminophen (TYLENOL) tablet 650 mg  650 mg Oral Q4H PRN Karmen Bongo, MD  650 mg at 04/05/22 2039   amiodarone (NEXTERONE PREMIX) 360-4.14 MG/200ML-% (1.8 mg/mL) IV infusion  30 mg/hr Intravenous Continuous Charlie Pitter, PA-C 16.67 mL/hr at 04/06/22 0039 30 mg/hr at 04/06/22 0039   clopidogrel (PLAVIX) tablet 75 mg  75 mg Oral Daily Dunn, Dayna N, PA-C   75 mg at 04/06/22 0835   insulin aspart (novoLOG) injection 0-15 Units  0-15 Units Subcutaneous TID [facility] Karmen Bongo, MD   2 Units at 04/06/22 0834   insulin aspart (novoLOG) injection 0-5 Units  0-5 Units Subcutaneous QHS Karmen Bongo, MD       insulin glargine-yfgn [facility]) injection 8 Units  8 Units Subcutaneous QHS Karmen Bongo, MD   8 Units at 04/05/22 2044   metoprolol tartrate (LOPRESSOR) tablet 50 mg  50 mg Oral BID Dunn, Dayna N, PA-C   50 mg at 04/06/22 3151   nicotine (NICODERM CQ - dosed in mg/24 hours) patch 21 mg  21 mg Transdermal Daily Karmen Bongo, MD   21 mg at 04/06/22 0834   nitroGLYCERIN (NITROSTAT) SL tablet 0.4 mg  0.4 mg Sublingual Q5 min PRN Karmen Bongo, MD       ondansetron [facility]) injection 4 mg  4 mg Intravenous Q6H PRN Karmen Bongo, MD       rosuvastatin (CRESTOR) tablet 20 mg  20 mg Oral Daily Freada Bergeron, MD   20 mg at 04/05/22 1700   sertraline (ZOLOFT) tablet 50 mg  50 mg Oral Daily Elgergawy, Silver Huguenin, MD   50 mg at 04/06/22 0834   sodium chloride flush (NS) 0.9 % injection 3 mL  3 mL Intravenous Lillia Mountain, MD   3 mL at 04/04/22 0903   sodium chloride flush (NS) 0.9 % injection 3 mL  3 mL Intravenous PRN Karmen Bongo, MD       tamsulosin [facility]) capsule 0.4 mg  0.4 mg Oral Daily Eulogio Bear U, DO   0.4 mg at 04/06/22 7616   valproic acid (DEPAKENE) 250 MG/5ML solution 500 mg  500 mg Oral BID Elgergawy, Silver Huguenin, MD   500 mg at 04/06/22 0737   warfarin (COUMADIN) tablet 2.5 mg  2.5 mg Oral ONCE-1600 Einar Grad, [facility] [place]       Warfarin - Pharmacist Dosing Inpatient   Does not apply q1600 Einar Grad [facility]   Given at 04/05/22 1705     Discharge Medications: Please see discharge summary for a list of discharge medications.  Relevant Imaging Results:  Relevant Lab  Results:   Additional Information [id number] Incision open or dehisced skin tear hand,posterior,R,foam lift dressing to assess site every shift,clean,dry,intact  Milas Gain, LCSWA

## 2022-04-06 NOTE — Progress Notes (Signed)
PROGRESS NOTE    Dennis [name]  OXB:[id number] DOB: [birth year] DOA: 04/02/2022 PCP: Glenda Chroman, MD    Brief Narrative:  Dennis [name] is a 76 y.o. 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  He reports chest pain started 2 days ago.  It may have been exertional.  It comes and goes.  Better with rest.  Worse with doing thing and at nights.   Pain is substernal. He quit smoking about 2 weeks ago.   -Patient was found to have elevated troponin, concern for NSTEMI, he was admitted for further work-up as transfer from [facility].  Remains confused.  Followed by cardiology.   Assessment and plan of care:  NSTEMI known history of CAD, hyperlipidemia -Troponin peaked at 1.635 -Management per cardiology, poor candidate for cardiac cath given his dementia, intermittent agitation, pulling lines, and requiring restraints, as well secondary due to CKD, anemia and sacral pressure ulcers, and overall poor functional status. -Continue with conservative management, started on Plavix.  Was on heparin and now therapeutic on Coumadin. -On metoprolol, uptitrated by cardiology. -He is on statin, has rosuvastatin  titrated from 5 to 20 mg.  HTN -Blood pressure is soft, continue to hold lisinopril especially with CKD  HLD -LDL 72, rosuvastatin titrated from 5 to 20 mg.  Is stable.   DM -A1C: 5.4-- improved from prior -Hold glipizide -Continue Lantus qhs -SSI   A-fib with RVR -Heart rate uncontrolled, -Started on amiodarone drip per cardiology. -On warfarin for anticoagulation   Dementia/Mood d/o - delirium precautions -On Seroquel, sertraline and Depakote -High risk for hospital delirium, with significant confusion overlying, pulling his lines and Foley catheter. -More calm this morning but confused.  PVD -s/p R  AKA, well healed   Stage 3a CKD -Slightly worse than baseline -Hold lisinopril -Monitor.   Sacral pressure ulcer -RN noted stage 2 sacral pressure ulcer, present on admission -Local wound care.   Acute urinary retention -Foley placed 7/15 for retention, discontinued yesterday, so far no evidence of retention.  Hypokalemia -Repleted, continue to monitor closely due to prolonged QTc  Severe medical debility and disability, will benefit with goal of care discussion.  Consult palliative care team.   DVT prophylaxis: warfarin (COUMADIN) tablet 2.5 mg    Code Status: DNR Family communication: None.   Disposition Plan:  Level of care: Progressive Status is: Inpatient Remains inpatient appropriate because: Unsafe discharge plan.    Consultants:  Cardiology Palliative care   Subjective:  Patient seen and examined.  Overnight more calm and comfortable, however remains confused and has some hallucinations.  Denies any chest pain or shortness of breath. Seen him working with physical therapy, he could hardly take any steps.  Objective: Vitals:   04/06/22 0319 04/06/22 0533 04/06/22 0758 04/06/22 1153  BP: (!) 97/56 96/73 108/62 (!) 112/58  Pulse:  85 94 78  Resp: '14 19 12 19  '$ Temp: 98.4 F (36.9 C)  98 F (36.7 C) 98 F (36.7 C)  TempSrc: Oral  Oral Oral  SpO2: 93%  93% 94%  Weight: 79 kg     Height:        Intake/Output Summary (Last 24 hours) at 04/06/2022 1519 Last data filed at 04/06/2022 0800 Gross per 24 hour  Intake 1306.26 ml  Output 600 ml  Net 706.26 ml    Danley Danker  Weights   04/02/22 1700 04/06/22 0319  Weight: 85.9 kg 79 kg    Examination:  General: Chronically sick looking.  On room air.  Not in any distress. Alert and awake but not oriented.  He is oriented to himself.  He is seeing things and also he thinks he is in the shop. Cardiovascular: S1-S2 normal.  Irregularly rate and rhythm. Respiratory: No added sounds. Gastrointestinal: Soft.   Nontender.  Bowel sound present. Ext: No edema or cyanosis.    Data Reviewed: I have personally reviewed following labs and imaging studies  CBC: Recent Labs  Lab 04/02/22 2217 04/03/22 0326 04/04/22 0346 04/05/22 0703 04/06/22 0315  WBC 12.4* 11.4* 10.7* 8.5 7.9  HGB 9.2* 8.9* 9.3* 9.6* 9.7*  HCT 26.9* 26.6* 28.4* 27.9* 28.8*  MCV 90.0 90.2 91.9 88.9 88.6  PLT 252 242 269 260 211    Basic Metabolic Panel: Recent Labs  Lab 04/02/22 2217 04/03/22 0326 04/04/22 0346 04/05/22 0703 04/06/22 0315  NA 136 139 139 141 139  K 6.0* 5.0 3.9 3.2* 3.7  CL 103 110 105 100 102  CO2 20* 21* '27 25 26  '$ GLUCOSE 256* 108* 118* 107* 111*  BUN 55* 54* 50* 46* 45*  CREATININE 2.14* 2.04* 1.92* 2.08* 2.30*  CALCIUM 8.3* 8.3* 8.2* 8.2* 8.2*  MG 2.1  --   --  2.0 2.1  PHOS 4.1  --   --   --   --     GFR: Estimated Creatinine Clearance: 31.5 mL/min (A) (by C-G formula based on SCr of 2.3 mg/dL (H)). Liver Function Tests: Recent Labs  Lab 04/02/22 1656  AST 26  ALT 29  ALKPHOS 78  BILITOT 0.7  PROT 6.8  ALBUMIN 2.9*    No results for input(s): "LIPASE", "AMYLASE" in the last 168 hours. No results for input(s): "AMMONIA" in the last 168 hours. Coagulation Profile: Recent Labs  Lab 04/02/22 1656 04/04/22 0346 04/05/22 0703 04/06/22 0315  INR 1.8* 1.5* 1.7* 2.1*    Cardiac Enzymes: No results for input(s): "CKTOTAL", "CKMB", "CKMBINDEX", "TROPONINI" in the last 168 hours. BNP (last 3 results) No results for input(s): "PROBNP" in the last 8760 hours. HbA1C: No results for input(s): "HGBA1C" in the last 72 hours.  CBG: Recent Labs  Lab 04/05/22 1123 04/05/22 1634 04/05/22 2127 04/06/22 0753 04/06/22 1150  GLUCAP 149* 170* 135* 136* 118*    Lipid Profile: No results for input(s): "CHOL", "HDL", "LDLCALC", "TRIG", "CHOLHDL", "LDLDIRECT" in the last 72 hours.  Thyroid Function Tests: Recent Labs    04/05/22 0703  FREET4 0.95    Anemia Panel: No results  for input(s): "VITAMINB12", "FOLATE", "FERRITIN", "TIBC", "IRON", "RETICCTPCT" in the last 72 hours. Sepsis Labs: No results for input(s): "PROCALCITON", "LATICACIDVEN" in the last 168 hours.  No results found for this or any previous visit (from the past 240 hour(s)).       Radiology Studies: DG Chest 2 View  Result Date: 04/05/2022 CLINICAL DATA:  Shortness of breath.  History of pleural effusion. EXAM: CHEST - 2 VIEW COMPARISON:  04/02/2022 FINDINGS: Stable cardiac enlargement. There is a moderate right pleural effusion with near complete atelectasis of the right lower lobe. Small left pleural effusion identified. No interstitial edema. IMPRESSION: 1. Moderate right pleural effusion and small left pleural effusion. 2. Right lower lobe atelectasis. Electronically Signed   By: Kerby Moors M.D.   On: 04/05/2022 11:24        Scheduled Meds:  clopidogrel  75 mg Oral Daily  insulin aspart  0-15 Units Subcutaneous TID WC   insulin aspart  0-5 Units Subcutaneous QHS   insulin glargine-yfgn  8 Units Subcutaneous QHS   metoprolol tartrate  50 mg Oral BID   nicotine  21 mg Transdermal Daily   rosuvastatin  20 mg Oral Daily   sertraline  50 mg Oral Daily   sodium chloride flush  3 mL Intravenous Q12H   tamsulosin  0.4 mg Oral Daily   valproic acid  500 mg Oral BID   warfarin  2.5 mg Oral ONCE-1600   Warfarin - Pharmacist Dosing Inpatient   Does not apply q1600   Continuous Infusions:  sodium chloride     amiodarone 30 mg/hr (04/06/22 1349)     LOS: 4 days     Barb Merino, MD [facility] Available via Epic secure chat 7am-7pm After these hours, please refer to coverage provider listed on [email or web address] 04/06/2022, 3:19 PM

## 2022-04-06 NOTE — Progress Notes (Signed)
[place] for warfarin and heparin Indication: atrial fibrillation  Allergies  Allergen Reactions   Betadine [Povidone Iodine] Swelling    Facial swelling   Fish Allergy Swelling    Facial swelling. Many years ago.   Iodine Swelling    Facial swelling   Shellfish Allergy Swelling    Facial swelling   Sulfamethoxazole-Trimethoprim Swelling    Facial swelling   Gabapentin Other (See Comments)    Unknown reaction   Chlorhexidine     unknown   Trimethoprim Swelling    Facial swelling - Doesn't think he ever took this by itself.   Tramadol Other (See Comments)    Makes him crazy    Patient Measurements: Height: '6\' 1"'$  (185.4 cm) Weight: 79 kg (174 lb 2.6 oz) IBW/kg (Calculated) : 79.9  Vital Signs: Temp: 98.4 F (36.9 C) (07/19 0319) Temp Source: Oral (07/19 0319) BP: 96/73 (07/19 0533) Pulse Rate: 85 (07/19 0533)  Labs: Recent Labs    04/04/22 0346 04/04/22 1404 04/05/22 0703 04/06/22 0315  HGB 9.3*  --  9.6* 9.7*  HCT 28.4*  --  27.9* 28.8*  PLT 269  --  260 281  LABPROT 17.9*  --  20.0* 23.3*  INR 1.5*  --  1.7* 2.1*  HEPARINUNFRC 0.12* 0.19* 0.32 0.46  CREATININE 1.92*  --  2.08* 2.30*     Estimated Creatinine Clearance: 31.5 mL/min (A) (by C-G formula based on SCr of 2.3 mg/dL (H)).   Medical History: Past Medical History:  Diagnosis Date   Arthritis    CAD (coronary artery disease)    a. s/p prior stenting of RCA and OM2 in 2006 b. s/p STEMI in 10/2017 with stenting of mid-RCA and PDA with DES x2   Chronic pain    Diabetes mellitus ([place])    Hyperlipidemia    Hypertension    STEMI (ST elevation myocardial infarction) ([place])    Tobacco use     Assessment: 76 y/o male presented with chest pain to OSH ([place]) and transferred to [facility]. Pt on warfarin PTA for AFib which was held on admit for possible cath, now planning med management. Warfarin resumed, heparin bridge continued with low INR.  INR today  is therapeutic at 2.1, heparin level therapeutic, CBC stable. With new amiodarone will need to reduce warfarin dose - recommend 2.'5mg'$  daily at discharge.  Home warfarin '5mg'$  MWF, 2.'5mg'$  AODs.    Goal of Therapy:  Anti-Xa 0.3-0.7 units/mL INR 2-3 Monitor platelets by anticoagulation protocol: Yes   Plan:  Stop heparin (INR >2) Warfarin 2.'5mg'$  x1 tonight Daily INR  Arrie Senate, PharmD, BCPS, [facility] Clinical Pharmacist [phone] Please check AMION for all [facility] Pharmacy numbers 04/06/2022

## 2022-04-06 NOTE — Evaluation (Signed)
Physical Therapy Evaluation Patient Details Name: Dennis [name] MRN: [id number] DOB: 1946-03-19 Today's Date: 04/06/2022  History of Present Illness  Pt is a 76 y/o male admitted secondary to ACD and NSTEMI. PMH includes CAD, HTN, PAD, dementia, DM, a fib, CKD, R AKA, and tobacco use.  Clinical Impression  Pt admitted secondary to problem above with deficits below. Pt with notable memory deficits and reporting his R foot was broken. Had to remind pt he had a R AKA. Mod A +2 to stand and pt very shaky and unable to take hop steps. Pt reports he lives alone and has had multiple falls. Recommending SNF level therapies to increase independence and safety. Will continue to follow acutely.        Recommendations for follow up therapy are one component of a multi-disciplinary discharge planning process, led by the attending physician.  Recommendations may be updated based on patient status, additional functional criteria and insurance authorization.  Follow Up Recommendations Skilled nursing-short term rehab (<3 hours/day) Can patient physically be transported by private vehicle: No    Assistance Recommended at Discharge Frequent or constant Supervision/Assistance  Patient can return home with the following  Two people to help with walking and/or transfers;Two people to help with bathing/dressing/bathroom;Assistance with cooking/housework;Direct supervision/assist for financial management;Direct supervision/assist for medications management;Assist for transportation;Help with stairs or ramp for entrance    Equipment Recommendations Hospital bed  Recommendations for Other Services       Functional Status Assessment Patient has had a recent decline in their functional status and demonstrates the ability to make significant improvements in function in a reasonable and predictable amount of time.     Precautions / Restrictions Precautions Precautions: Fall Precaution Comments: R AKA at  baseline Restrictions Weight Bearing Restrictions: No      Mobility  Bed Mobility Overal bed mobility: Needs Assistance Bed Mobility: Supine to Sit, Sit to Supine     Supine to sit: Min assist Sit to supine: Min assist   General bed mobility comments: Min A for trunk assist throughout. Pt with uncontrolled descent when returning back to supine.    Transfers Overall transfer level: Needs assistance Equipment used: Rolling walker (2 wheels) Transfers: Sit to/from Stand Sit to Stand: Mod assist, +2 physical assistance           General transfer comment: Mod A +2 for lift assist and steadying. Pt reporting mild dizziness. Unable to take hop steps.    Ambulation/Gait                  Stairs            Wheelchair Mobility    Modified Rankin (Stroke Patients Only)       Balance Overall balance assessment: Needs assistance Sitting-balance support: No upper extremity supported, Feet supported Sitting balance-Leahy Scale: Fair     Standing balance support: Bilateral upper extremity supported Standing balance-Leahy Scale: Poor Standing balance comment: REliant on UE and external support                             Pertinent Vitals/Pain Pain Assessment Pain Assessment: 0-10 Pain Score: 6  Pain Location: RLE phantom pain Pain Descriptors / Indicators: Grimacing, Guarding Pain Intervention(s): Monitored during session, Limited activity within patient's tolerance, Repositioned    Home Living Family/patient expects to be discharged to:: Private residence Living Arrangements: Alone Available Help at Discharge:  (reports no one) Type of Home: House Home Access:  Stairs to enter Entrance Stairs-Rails: Psychiatric nurse of Steps: 1   Home Layout: One level Home Equipment: Wheelchair - manual;Cane - single point;Shower Land (2 wheels);Grab bars - tub/shower      Prior Function Prior Level of Function : Needs  assist             Mobility Comments: Initially reporting using a cane, then states his prosthetic is broken. Asked pt if he uses a WC and he said yes, but reports he falls getting into/out of it sometimes. Reports wife was helping him. ADLs Comments: Reports independence with ADLs but question accuracy     Hand Dominance        Extremity/Trunk Assessment   Upper Extremity Assessment Upper Extremity Assessment: Defer to OT evaluation    Lower Extremity Assessment Lower Extremity Assessment: RLE deficits/detail RLE Deficits / Details: R AKA at baseline, however, pt still reports he has his RLE and his foot is broken    Cervical / Trunk Assessment Cervical / Trunk Assessment: Normal  Communication   Communication: No difficulties  Cognition Arousal/Alertness: Awake/alert Behavior During Therapy: WFL for tasks assessed/performed Overall Cognitive Status: Impaired/Different from baseline Area of Impairment: Orientation, Attention, Memory, Following commands, Safety/judgement, Awareness, Problem solving                 Orientation Level: Disoriented to, Place, Time, Situation Current Attention Level: Focused Memory: Decreased recall of precautions, Decreased short-term memory Following Commands: Follows one step commands with increased time Safety/Judgement: Decreased awareness of deficits, Decreased awareness of safety Awareness: Intellectual Problem Solving: Slow processing, Difficulty sequencing, Requires tactile cues, Requires verbal cues General Comments: 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 Thought it was january of 2033 but able to correct the year without cues. Reporting his R foot is broken, but is a R AKA. Attempted to clarify if it was his prosthetic, but he kept reporting it was his foot.        General Comments General comments (skin integrity, edema, etc.): No family present    Exercises     Assessment/Plan    PT Assessment Patient needs continued PT  services  PT Problem List Decreased strength;Decreased activity tolerance;Decreased mobility;Decreased balance;Decreased coordination;Decreased cognition;Decreased knowledge of use of DME;Decreased safety awareness;Decreased knowledge of precautions       PT Treatment Interventions DME instruction;Gait training;Functional mobility training;Therapeutic activities;Therapeutic exercise;Balance training;Patient/family education    PT Goals (Current goals can be found in the Care Plan section)  Acute Rehab PT Goals Patient Stated Goal: to get better PT Goal Formulation: With patient Time For Goal Achievement: 04/20/22 Potential to Achieve Goals: Fair    Frequency Min 2X/week     Co-evaluation PT/OT/SLP Co-Evaluation/Treatment: Yes Reason for Co-Treatment: For patient/therapist safety;To address functional/ADL transfers PT goals addressed during session: Mobility/safety with mobility;Balance         AM-PAC PT "6 Clicks" Mobility  Outcome Measure Help needed turning from your back to your side while in a flat bed without using bedrails?: A Little Help needed moving from lying on your back to sitting on the side of a flat bed without using bedrails?: A Little Help needed moving to and from a bed to a chair (including a wheelchair)?: Total Help needed standing up from a chair using your arms (e.g., wheelchair or bedside chair)?: Total Help needed to walk in hospital room?: Total Help needed climbing 3-5 steps with a railing? : Total 6 Click Score: 10    End of Session Equipment Utilized During Treatment:  Gait belt Activity Tolerance: Patient tolerated treatment well Patient left: in bed;with call bell/phone within reach;with bed alarm set;Other (comment) (with posey alarm on) Nurse Communication: Mobility status PT Visit Diagnosis: Difficulty in walking, not elsewhere classified (R26.2);History of falling (Z91.81);Muscle weakness (generalized) (M62.81);Unsteadiness on feet (R26.81)     Time: 5041-3643 PT Time Calculation (min) (ACUTE ONLY): 24 min   Charges:   PT Evaluation $PT Eval Moderate Complexity: 1 Mod          Reuel Derby, PT, DPT  Acute Rehabilitation Services  Office: [phone]   Rudean Hitt 04/06/2022, 10:18 AM

## 2022-04-06 NOTE — Evaluation (Signed)
Occupational Therapy Evaluation Patient Details Name: Dennis [name] MRN: [id number] DOB: [birth year] Today's Date: 04/06/2022   History of Present Illness Pt is a 76 y/o male admitted secondary to ACD and NSTEMI. PMH includes CAD, HTN, PAD, dementia, DM, a fib, CKD, R AKA, and tobacco use.   Clinical Impression   Pt PTA: Pt with questionable history info. Pt currently, alert to self and with cues date. Pt limited by poor cognition, decreased ability to care for self, decreased mobility and decreased activity tolerance.  Pt modA +2 for bed mobility and sit to stand tasks. Pt unsafe to transfer with RLE AKA and reports pain in foot and broken R foot. Pt with very questionable cognition. Pt set-upA to [place] for ADL at this time. Pt unsafe to return home. Pt requires continued OT skilled services. OT following acutely.     Recommendations for follow up therapy are one component of a multi-disciplinary discharge planning process, led by the attending physician.  Recommendations may be updated based on patient status, additional functional criteria and insurance authorization.   Follow Up Recommendations  Skilled nursing-short term rehab (<3 hours/day)    Assistance Recommended at Discharge Frequent or constant Supervision/Assistance  Patient can return home with the following A lot of help with walking and/or transfers;A lot of help with bathing/dressing/bathroom;Assistance with cooking/housework;Direct supervision/assist for medications management;Direct supervision/assist for financial management;Assist for transportation;Help with stairs or ramp for entrance    Functional Status Assessment  Patient has had a recent decline in their functional status and demonstrates the ability to make significant improvements in function in a reasonable and predictable amount of time.  Equipment Recommendations  BSC/3in1    Recommendations for Other Services       Precautions / Restrictions  Precautions Precautions: Fall Precaution Comments: R AKA at baseline Restrictions Weight Bearing Restrictions: No      Mobility Bed Mobility Overal bed mobility: Needs Assistance Bed Mobility: Supine to Sit, Sit to Supine     Supine to sit: Min assist Sit to supine: Min assist   General bed mobility comments: Min A for trunk assist throughout. Pt with uncontrolled descent when returning back to supine.    Transfers Overall transfer level: Needs assistance Equipment used: Rolling walker (2 wheels) Transfers: Sit to/from Stand Sit to Stand: Mod assist, +2 physical assistance           General transfer comment: Mod A +2 for lift assist and steadying. Pt reporting mild dizziness. Unable to take hop steps.      Balance Overall balance assessment: Needs assistance Sitting-balance support: No upper extremity supported, Feet supported Sitting balance-Leahy Scale: Fair     Standing balance support: Bilateral upper extremity supported Standing balance-Leahy Scale: Poor Standing balance comment: Reliant on UE and external support                           ADL either performed or assessed with clinical judgement   ADL Overall ADL's : Needs assistance/impaired Eating/Feeding: Minimal assistance;Sitting   Grooming: Set up;Brushing hair;Sitting   Upper Body Bathing: Moderate assistance;Sitting   Lower Body Bathing: Maximal assistance;Sitting/lateral leans;Sit to/from stand;Cueing for safety;Cueing for sequencing   Upper Body Dressing : Moderate assistance;Sitting   Lower Body Dressing: Maximal assistance;Sitting/lateral leans;Sit to/from stand   Toilet Transfer: Maximal assistance;+2 for physical assistance;+2 for safety/equipment Toilet Transfer Details (indicate cue type and reason): STS only; unsafe to pivot due to poor cognition Toileting- Clothing Manipulation and Hygiene: Maximal assistance;Sitting/lateral  lean;Sit to/from stand       Functional  mobility during ADLs: Moderate assistance;+2 for physical assistance;+2 for safety/equipment;Cueing for safety;Cueing for sequencing;Rolling walker (2 wheels) General ADL Comments: Pt limited by poor cognition, decreased ability to care for self, decreased mobility and decreased activity tolerance.     Vision Baseline Vision/History: 2 Legally blind Ability to See in Adequate Light: 2 Moderately impaired Patient Visual Report: No change from baseline Vision Assessment?: Vision impaired- to be further tested in functional context Additional Comments: L eye blindness; unable to see clock or calendar to report either correctly.     Perception     Praxis      Pertinent Vitals/Pain Pain Assessment Pain Assessment: 0-10 Pain Score: 6  Pain Location: RLE phantom pain Pain Descriptors / Indicators: Grimacing, Guarding Pain Intervention(s): Monitored during session     Hand Dominance Right   Extremity/Trunk Assessment Upper Extremity Assessment Upper Extremity Assessment: Generalized weakness   Lower Extremity Assessment Lower Extremity Assessment: RLE deficits/detail;Defer to PT evaluation RLE Deficits / Details: R AKA at baseline, however, pt still reports he has his RLE and his foot is broken   Cervical / Trunk Assessment Cervical / Trunk Assessment: Normal   Communication Communication Communication: No difficulties   Cognition Arousal/Alertness: Awake/alert Behavior During Therapy: WFL for tasks assessed/performed Overall Cognitive Status: Impaired/Different from baseline Area of Impairment: Orientation, Attention, Memory, Following commands, Safety/judgement, Awareness, Problem solving                 Orientation Level: Disoriented to, Place, Time, Situation Current Attention Level: Focused Memory: Decreased recall of precautions, Decreased short-term memory Following Commands: Follows one step commands with increased time Safety/Judgement: Decreased awareness of  deficits, Decreased awareness of safety Awareness: Intellectual Problem Solving: Slow processing, Difficulty sequencing, Requires tactile cues, Requires verbal cues General Comments: 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 Thought it was january of 2033 but able to correct the year without cues. Reporting his R foot is broken, but is a R AKA. Attempted to clarify if it was his prosthetic, but he kept reporting it was his foot. Pt looking at clock, but unable to read it correctly.     General Comments  no family present    Exercises     Shoulder Instructions      Home Living Family/patient expects to be discharged to:: Private residence Living Arrangements: Alone Available Help at Discharge:  (reports no one) Type of Home: House Home Access: Stairs to enter [employer] of Steps: 1 Entrance Stairs-Rails: Right;Left Home Layout: One level     Bathroom Shower/Tub: Occupational psychologist: Standard     Home Equipment: Wheelchair - manual;Cane - single point;Shower Land (2 wheels);Grab bars - tub/shower          Prior Functioning/Environment Prior Level of Function : Needs assist             Mobility Comments: Initially reporting using a cane, then states his prosthetic is broken. Asked pt if he uses a WC and he said yes, but reports he falls getting into/out of it sometimes. Reports wife was helping him. ADLs Comments: Reports independence with ADLs, but question accuracy        OT Problem List: Decreased strength;Decreased activity tolerance;Impaired balance (sitting and/or standing);Decreased cognition;Decreased safety awareness;Pain;Decreased knowledge of use of DME or AE;Decreased knowledge of precautions      OT Treatment/Interventions: Self-care/ADL training;Therapeutic exercise;Energy conservation;DME and/or AE instruction;Cognitive remediation/compensation;Therapeutic activities;Patient/family education;Visual/perceptual  remediation/compensation;Balance training  OT Goals(Current goals can be found in the care plan section) Acute Rehab OT Goals Patient Stated Goal: reduce pain in "R foot" OT Goal Formulation: Patient unable to participate in goal setting Time For Goal Achievement: 04/20/22 Potential to Achieve Goals: Good ADL Goals Pt Will Perform Grooming: with set-up;sitting Pt Will Transfer to Toilet: with min assist;bedside commode Additional ADL Goal #1: Pt will perform (2) multi-step commands with 80% accuracy with  2 verbal cues. Additional ADL Goal #2: Pt will use tactics to be oriented x4 with <2 verbal cues.  OT Frequency: Min 2X/week    Co-evaluation PT/OT/SLP Co-Evaluation/Treatment: Yes Reason for Co-Treatment: Complexity of the patient's impairments (multi-system involvement);For patient/therapist safety;To address functional/ADL transfers PT goals addressed during session: Mobility/safety with mobility;Balance OT goals addressed during session: ADL's and self-care      AM-PAC OT "6 Clicks" Daily Activity     Outcome Measure Help from another person eating meals?: A Little Help from another person taking care of personal grooming?: A Little Help from another person toileting, which includes using toliet, bedpan, or urinal?: A Lot Help from another person bathing (including washing, rinsing, drying)?: A Lot Help from another person to put on and taking off regular upper body clothing?: A Little Help from another person to put on and taking off regular lower body clothing?: A Lot 6 Click Score: 15   End of Session Equipment Utilized During Treatment: Gait belt;Rolling walker (2 wheels) Nurse Communication: Mobility status  Activity Tolerance: Patient tolerated treatment well Patient left: in bed;with call bell/phone within reach;with bed alarm set;with nursing/sitter in room  OT Visit Diagnosis: Unsteadiness on feet (R26.81);Muscle weakness (generalized) (M62.81);Other symptoms and  signs involving cognitive function                Time: 5732-2025 OT Time Calculation (min): 18 min Charges:  OT General Charges $OT Visit: 1 Visit OT Evaluation $OT Eval Moderate Complexity: 1 Mod Jefferey Pica, OTR/L Acute Rehabilitation Services Office: [phone]   [id number] 04/06/2022, 10:58 AM

## 2022-04-07 DIAGNOSIS — I249 Acute ischemic heart disease, unspecified: Secondary | ICD-10-CM | POA: Diagnosis not present

## 2022-04-07 LAB — CBC
HCT: 28.4 % — ABNORMAL LOW (ref 39.0–52.0)
Hemoglobin: 9.6 g/dL — ABNORMAL LOW (ref 13.0–17.0)
MCH: 29.8 pg (ref 26.0–34.0)
MCHC: 33.8 g/dL (ref 30.0–36.0)
MCV: 88.2 fL (ref 80.0–100.0)
Platelets: 285 10*3/uL (ref 150–400)
RBC: 3.22 MIL/uL — ABNORMAL LOW (ref 4.22–5.81)
RDW: 13.2 % (ref 11.5–15.5)
WBC: 6.7 10*3/uL (ref 4.0–10.5)
nRBC: 0 % (ref 0.0–0.2)

## 2022-04-07 LAB — BASIC METABOLIC PANEL
Anion gap: 14 (ref 5–15)
BUN: 35 mg/dL — ABNORMAL HIGH (ref 8–23)
CO2: 23 mmol/L (ref 22–32)
Calcium: 8.6 mg/dL — ABNORMAL LOW (ref 8.9–10.3)
Chloride: 104 mmol/L (ref 98–111)
Creatinine, Ser: 2.04 mg/dL — ABNORMAL HIGH (ref 0.61–1.24)
GFR, Estimated: 34 mL/min — ABNORMAL LOW (ref >60)
Glucose, Bld: 127 mg/dL — ABNORMAL HIGH (ref 70–99)
Potassium: 3.6 mmol/L (ref 3.5–5.1)
Sodium: 141 mmol/L (ref 135–145)

## 2022-04-07 LAB — PROTIME-INR
INR: 2.6 — ABNORMAL HIGH (ref 0.8–1.2)
Prothrombin Time: 27.7 seconds — ABNORMAL HIGH (ref 11.4–15.2)

## 2022-04-07 LAB — GLUCOSE, CAPILLARY
Glucose-Capillary: 119 mg/dL — ABNORMAL HIGH (ref 70–99)
Glucose-Capillary: 149 mg/dL — ABNORMAL HIGH (ref 70–99)
Glucose-Capillary: 176 mg/dL — ABNORMAL HIGH (ref 70–99)
Glucose-Capillary: 94 mg/dL (ref 70–99)

## 2022-04-07 LAB — MAGNESIUM: Magnesium: 1.9 mg/dL (ref 1.7–2.4)

## 2022-04-07 MED ORDER — QUETIAPINE FUMARATE 50 MG PO TABS
100.0000 mg | ORAL_TABLET | Freq: Every day | ORAL | Status: DC
  Administered 2022-04-07 – 2022-04-10 (×4): 100 mg via ORAL
  Filled 2022-04-07 (×4): qty 2

## 2022-04-07 MED ORDER — MAGNESIUM OXIDE -MG SUPPLEMENT 400 (240 MG) MG PO TABS
400.0000 mg | ORAL_TABLET | Freq: Every day | ORAL | Status: DC
  Administered 2022-04-07 – 2022-04-10 (×4): 400 mg via ORAL
  Filled 2022-04-07 (×4): qty 1

## 2022-04-07 MED ORDER — LORAZEPAM 2 MG/ML IJ SOLN
0.2500 mg | Freq: Once | INTRAMUSCULAR | Status: AC
  Administered 2022-04-07: 0.25 mg via INTRAVENOUS
  Filled 2022-04-07: qty 1

## 2022-04-07 MED ORDER — WARFARIN SODIUM 2.5 MG PO TABS
2.5000 mg | ORAL_TABLET | Freq: Once | ORAL | Status: AC
  Administered 2022-04-07: 2.5 mg via ORAL
  Filled 2022-04-07: qty 1

## 2022-04-07 NOTE — Progress Notes (Signed)
ANTICOAGULATION CONSULT NOTE  Pharmacy Consult for warfarin  Indication: atrial fibrillation  Allergies  Allergen Reactions   Betadine [Povidone Iodine] Swelling    Facial swelling   Fish Allergy Swelling    Facial swelling. Many years ago.   Iodine Swelling    Facial swelling   Shellfish Allergy Swelling    Facial swelling   Sulfamethoxazole-Trimethoprim Swelling    Facial swelling   Gabapentin Other (See Comments)    Unknown reaction   Chlorhexidine     unknown   Trimethoprim Swelling    Facial swelling - Doesn't think he ever took this by itself.   Tramadol Other (See Comments)    Makes him crazy    Patient Measurements: Height: '6\' 1"'$  (185.4 cm) Weight: 81.7 kg (180 lb 1.9 oz) IBW/kg (Calculated) : 79.9  Vital Signs: Temp: 97.7 F (36.5 C) (07/20 0738) Temp Source: Oral (07/20 0738) BP: 111/74 (07/20 0738) Pulse Rate: 98 (07/20 0738)  Labs: Recent Labs    04/04/22 1404 04/05/22 0703 04/05/22 0703 04/06/22 0315 04/07/22 0213  HGB  --  9.6*   < > 9.7* 9.6*  HCT  --  27.9*  --  28.8* 28.4*  PLT  --  260  --  281 285  LABPROT  --  20.0*  --  23.3* 27.7*  INR  --  1.7*  --  2.1* 2.6*  HEPARINUNFRC 0.19* 0.32  --  0.46  --   CREATININE  --  2.08*  --  2.30* 2.04*   < > = values in this interval not displayed.     Estimated Creatinine Clearance: 35.9 mL/min (A) (by C-G formula based on SCr of 2.04 mg/dL (H)).   Medical History: Past Medical History:  Diagnosis Date   Arthritis    CAD (coronary artery disease)    a. s/p prior stenting of RCA and OM2 in 2006 b. s/p STEMI in 10/2017 with stenting of mid-RCA and PDA with DES x2   Chronic pain    Diabetes mellitus ([place])    Hyperlipidemia    Hypertension    STEMI (ST elevation myocardial infarction) ([place])    Tobacco use     Assessment: 76 y/o male presented with chest pain to OSH ([place]) and transferred to [facility]. Pt on warfarin PTA for AFib which was held on admit for possible cath, now  planning med management. Warfarin resumed, heparin bridge continued with low INR.  Heparin stopped 7/19 due to therapeutic INR. INR today is therapeutic at 2.6, CBC stable. With new amiodarone will need to reduce warfarin dose - recommend 2.'5mg'$  daily at discharge.  Home warfarin '5mg'$  MWF, 2.'5mg'$  AODs.   Goal of Therapy:  INR 2-3 Monitor platelets by anticoagulation protocol: Yes   Plan:  Warfarin 2.'5mg'$  daily  Daily INR, CBC, s/sx of bleeding   Francena Hanly, PharmD Pharmacy Resident  04/07/2022 10:08 AM

## 2022-04-07 NOTE — Plan of Care (Signed)

## 2022-04-07 NOTE — Care Management Important Message (Signed)
Important Message  Patient Details  Name: Dennis [name] MRN: [id number] Date of Birth: 1946/04/28   Medicare Important Message Given:  Yes     Shelda Altes 04/07/2022, 10:12 AM

## 2022-04-07 NOTE — Progress Notes (Addendum)
Daily Progress Note   Patient Name: Dennis [name]       Date: 04/07/2022 DOB: 1946-05-17  Age: 76 y.o. MRN#: [id number] Attending Physician: Barb Merino, MD Primary Care Physician: Glenda Chroman, MD Admit Date: 04/02/2022  Reason for Consultation/Follow-up: Establishing goals of care   Patient Profile/HPI:   76 y.o. 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  He was transferred from [facility].  Work-up reveals NSTEMI, elevated troponins, he is a poor candidate for cardiac cath given his dementia he has been intermittently agitated.  He is noted to have sacral pressure ulcers and poor functional status.  Cardiology recommended conservative medical medical management.  He is on an amiodarone drip for A-fib with RVR.  Palliative consulted for goals of care.  Subjective: Chart reviewed including labs, progress notes from this and current admissions as well as care everywhere, imaging. Evaluated patient, he was not oriented.  Per nursing he had some agitation last night and required lorazepam.  He is unable to participate in goals of care conversation. Met with his wife [name] at the bedside.  She shared that they are still married however have separate homes.  But she spends 24/7 in his home to assist in his care.  Introduced palliative medicine. Prior to this admission he was pretty much bedbound.  He did receive a home care assistant who came 3 times a week to assist him with bathing.  He was unable to toilet himself.  He was not mobile.  He would  forget that he had had an amputation and try to walk and then fall.  He would become easily agitated and angry. [name] notes that during his stay at the nursing facility they were able to control his temper and agitation with Seroquel. We discussed his current hospitalization and acute illness.  We also discussed the long-term trajectory of dementia. [name] shares that she does not feel that she can care for him further the way that she has been caring for him due to his disability and his worsening dementia.  She wishes for him to go to SNF  for rehab and then be placed in assisted living or long-term care. [name] feels that he does not have good quality of life.  If he were to decline further and have a terminal event she would wish for him to have comfort measures only and be allowed a natural death. We completed a MOST form.  The patient and family outlined their wishes for the following treatment decisions:  Cardiopulmonary Resuscitation: Do Not Attempt Resuscitation (DNR/No CPR)  Medical Interventions: Comfort Measures: Keep clean, warm, and dry. Use medication by any route, positioning, wound care, and other measures to relieve pain and suffering. Use oxygen, suction and manual treatment of airway obstruction as needed for comfort. Do not transfer to the hospital unless comfort needs cannot be met in current location.  Antibiotics: Determine use of limitation of antibiotics when infection occurs  IV Fluids: No IV fluids (provide other measures to ensure comfort)  Feeding Tube: No feeding tube     Review of Systems  Unable to perform ROS: Mental status change     Physical Exam Vitals and nursing note reviewed.  Constitutional:      Appearance: He is ill-appearing.  Cardiovascular:     Rate and Rhythm: Normal rate.  Neurological:     Mental Status: He is disoriented.  Psychiatric:     Comments: Impaired thought content and judgment hallucinating at times             Vital  Signs: BP 111/74 (BP Location: Left Arm)   Pulse 77   Temp 98.6 F (37 C) (Oral)   Resp 10   Ht 6' 1"  (1.854 m)   Wt 81.7 kg   SpO2 96%   BMI 23.76 kg/m  SpO2: SpO2: 96 % O2 Device: O2 Device: Room Air O2 Flow Rate: O2 Flow Rate (L/min): 2 L/min  Intake/output summary:  Intake/Output Summary (Last 24 hours) at 04/07/2022 1235 Last data filed at 04/07/2022 0029 Gross per 24 hour  Intake 639.37 ml  Output 875 ml  Net -235.63 ml   LBM: Last BM Date : 04/05/22 Baseline Weight: Weight: 85.9 kg Most recent weight: Weight: 81.7 kg       Palliative Assessment/Data: PPS 30%      Patient Active Problem List   Diagnosis Date Noted   ACS (acute coronary syndrome) ([place]) 04/02/2022   Type 2 diabetes mellitus with complication, with long-term current use of insulin ([place]) 04/02/2022   Dementia with behavioral disturbance ([place]) 04/02/2022   Chronic kidney disease, stage 3a ([place]) 04/02/2022   PAD (peripheral artery disease) ([place]) 07/01/2019   Palpitations 01/23/2018   Chronic anticoagulation 01/23/2018   Chronic back pain 01/23/2018   Atrial fibrillation with RVR (HCC)    PAF (paroxysmal atrial fibrillation) ([place]) 12/15/2017   Delirium    Closed right hip fracture ([place]) 12/09/2017   CAD S/P percutaneous coronary angioplasty 12/04/2017   Hypertension 12/04/2017   Hyperlipidemia 11/15/2017   Tobacco abuse 11/13/2017   History of ST elevation myocardial infarction (STEMI) 11/13/2017   Insulin dependent diabetes mellitus with complications 71/69/6789    Palliative Care Assessment & Plan    Assessment/Recommendations/Plan  DNR Continue current plan of care, medical stabilization and then discharged to a nursing facility for rehab with goal of eventual placement in assisted living or long-term care MOST completed see above for choices, copy scanned into [employer] Goal DNR form placed on chart and copy scanned to [facility] Outpatient palliative to follow and nursing facility Agitation  related to his dementia and probably worsened  by hospitalization-discussed risk versus benefit of Seroquel with spouse reviewed he has QTc prolongation and Seroquel has some risk of increasing this and him developing a fatal arrhythmia-however this risk can be decreased with magnesium supplementation-Barbara readily agreed that it was in his best interest to keep him on the Seroquel Restart Seroquel 100 mg p.o. daily Start magnesium oxide 400 mg p.o. daily   Code Status: DNR  Prognosis:  Unable to determine  Discharge Planning: [place] for rehab with Palliative care service follow-up  Care plan was discussed with patient's wife Damani Kelemen  Thank you for allowing the Palliative Medicine Team to assist in the care of this patient.  Total time: 80 minutes      Greater than 50%  of this time was spent counseling and coordinating care related to the above assessment and plan.  Mariana Kaufman, AGNP-C Palliative Medicine   Please contact Palliative Medicine Team phone at [phone] for questions and concerns.

## 2022-04-07 NOTE — Progress Notes (Signed)
PROGRESS NOTE    Dennis [name]  YHC:[id number] DOB: 01-23-1946 DOA: 04/02/2022 PCP: Glenda Chroman, MD    Brief Narrative:  Dennis [name] is a 76 y.o. 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  He reports chest pain started 2 days ago.  It may have been exertional.  It comes and goes.  Better with rest.  Worse with doing thing and at nights.   Pain is substernal. He quit smoking about 2 weeks ago.   -Patient was found to have elevated troponin, concern for NSTEMI, he was admitted for further work-up as transfer from [facility].  Remains confused.  Followed by cardiology.   Assessment and plan of care:  NSTEMI known history of CAD, hyperlipidemia -Troponin peaked at 1.635 -Management per cardiology, poor candidate for cardiac cath given his dementia, intermittent agitation, pulling lines, as well secondary due to CKD, anemia and sacral pressure ulcers, and overall poor functional status. -Continue with conservative management, started on Plavix.  Was on heparin and now therapeutic on Coumadin. -On metoprolol, uptitrated by cardiology. -He is on statin, has rosuvastatin  titrated from 5 to 20 mg.  HTN -Blood pressure is soft, continue to hold lisinopril especially with CKD  HLD -LDL 72, rosuvastatin titrated from 5 to 20 mg.  Is stable.   DM -A1C: 5.4-- improved from prior -Hold glipizide -Continue Lantus qhs -SSI   A-fib with RVR -Heart rate uncontrolled, -on amiodarone drip per cardiology. -On warfarin for anticoagulation.   Dementia/Mood d/o, failure to thrive, goal of care: - delirium precautions -Currently on Depakote.   -High risk for hospital delirium, with significant confusion overlying, pulling his lines and Foley catheter. -He does have QT prolongation, however benefits outweigh risk.   Will discuss with family about using Seroquel and other more stabilizer. -Goal of care discussion today with palliative care team.  SNF placement.  DNR and comfort care measures.  PVD -s/p R AKA, well healed   Stage 3a CKD -Slightly worse than baseline -Hold lisinopril -Monitor.   Sacral pressure ulcer -RN noted stage 2 sacral pressure ulcer, present on admission -Local wound care.   Acute urinary retention -Foley placed 7/15 for retention, currently able to urinate normally.  Hypokalemia -Repleted, continue to monitor closely due to prolonged QTc    DVT prophylaxis: warfarin (COUMADIN) tablet 2.5 mg    Code Status: DNR/comfort care Family communication: None.   Disposition Plan:  Level of care: Progressive Status is: Inpatient Remains inpatient appropriate because: Unsafe discharge plan.  Will need a skilled rehab.    Consultants:  Cardiology Palliative care   Subjective:  Patient seen and examined.  Overnight agitated.  Given 1 dose of Ativan.  During rounds, patient was angry agitated and asked me to let him walk.  He is not aware that he does not have a leg.  Remains very impulsive.  Objective: Vitals:   04/07/22 0028 04/07/22 0343 04/07/22 0738 04/07/22 1150  BP: 136/75 132/82 111/74   Pulse: 75 94 98 77  Resp: '17 20 10   '$ Temp: 98.1 F (36.7 C) 98.2 F (36.8 C) 97.7 F (36.5 C) 98.6 F (37 C)  TempSrc: Oral Oral Oral Oral  SpO2: 93% 95% 96% 96%  Weight:  81.7 kg    Height:        Intake/Output Summary (Last 24 hours) at  04/07/2022 1314 Last data filed at 04/07/2022 0029 Gross per 24 hour  Intake 639.37 ml  Output 875 ml  Net -235.63 ml   Filed Weights   04/02/22 1700 04/06/22 0319 04/07/22 0343  Weight: 85.9 kg 79 kg 81.7 kg    Examination:  General: Chronically sick looking.  On room air.  Not in any distress. Alert and awake but confused.  Agitated and impulsive. Cardiovascular: S1-S2 normal.  Irregularly rate and rhythm. Respiratory:  No added sounds. Gastrointestinal: Soft.  Nontender.  Bowel sound present. Ext: No edema or cyanosis.    Data Reviewed: I have personally reviewed following labs and imaging studies  CBC: Recent Labs  Lab 04/03/22 0326 04/04/22 0346 04/05/22 0703 04/06/22 0315 04/07/22 0213  WBC 11.4* 10.7* 8.5 7.9 6.7  HGB 8.9* 9.3* 9.6* 9.7* 9.6*  HCT 26.6* 28.4* 27.9* 28.8* 28.4*  MCV 90.2 91.9 88.9 88.6 88.2  PLT 242 269 260 281 010   Basic Metabolic Panel: Recent Labs  Lab 04/02/22 2217 04/03/22 0326 04/04/22 0346 04/05/22 0703 04/06/22 0315 04/07/22 0213  NA 136 139 139 141 139 141  K 6.0* 5.0 3.9 3.2* 3.7 3.6  CL 103 110 105 100 102 104  CO2 20* 21* '27 25 26 23  '$ GLUCOSE 256* 108* 118* 107* 111* 127*  BUN 55* 54* 50* 46* 45* 35*  CREATININE 2.14* 2.04* 1.92* 2.08* 2.30* 2.04*  CALCIUM 8.3* 8.3* 8.2* 8.2* 8.2* 8.6*  MG 2.1  --   --  2.0 2.1 1.9  PHOS 4.1  --   --   --   --   --    GFR: Estimated Creatinine Clearance: 35.9 mL/min (A) (by C-G formula based on SCr of 2.04 mg/dL (H)). Liver Function Tests: Recent Labs  Lab 04/02/22 1656  AST 26  ALT 29  ALKPHOS 78  BILITOT 0.7  PROT 6.8  ALBUMIN 2.9*   No results for input(s): "LIPASE", "AMYLASE" in the last 168 hours. No results for input(s): "AMMONIA" in the last 168 hours. Coagulation Profile: Recent Labs  Lab 04/02/22 1656 04/04/22 0346 04/05/22 0703 04/06/22 0315 04/07/22 0213  INR 1.8* 1.5* 1.7* 2.1* 2.6*   Cardiac Enzymes: No results for input(s): "CKTOTAL", "CKMB", "CKMBINDEX", "TROPONINI" in the last 168 hours. BNP (last 3 results) No results for input(s): "PROBNP" in the last 8760 hours. HbA1C: No results for input(s): "HGBA1C" in the last 72 hours.  CBG: Recent Labs  Lab 04/06/22 1150 04/06/22 1601 04/06/22 2235 04/07/22 0717 04/07/22 1148  GLUCAP 118* 173* 131* 119* 149*   Lipid Profile: No results for input(s): "CHOL", "HDL", "LDLCALC", "TRIG", "CHOLHDL", "LDLDIRECT" in the last 72  hours.  Thyroid Function Tests: Recent Labs    04/05/22 0703  FREET4 0.95   Anemia Panel: No results for input(s): "VITAMINB12", "FOLATE", "FERRITIN", "TIBC", "IRON", "RETICCTPCT" in the last 72 hours. Sepsis Labs: No results for input(s): "PROCALCITON", "LATICACIDVEN" in the last 168 hours.  No results found for this or any previous visit (from the past 240 hour(s)).       Radiology Studies: No results found.      Scheduled Meds:  clopidogrel  75 mg Oral Daily   insulin aspart  0-15 Units Subcutaneous TID WC   insulin aspart  0-5 Units Subcutaneous QHS   insulin glargine-yfgn  8 Units Subcutaneous QHS   metoprolol tartrate  50 mg Oral BID   nicotine  21 mg Transdermal Daily   rosuvastatin  20 mg Oral Daily   sertraline  50 mg Oral  Daily   sodium chloride flush  3 mL Intravenous Q12H   tamsulosin  0.4 mg Oral Daily   valproic acid  500 mg Oral BID   warfarin  2.5 mg Oral ONCE-1600   Warfarin - Pharmacist Dosing Inpatient   Does not apply q1600   Continuous Infusions:  sodium chloride     amiodarone 30 mg/hr (04/07/22 1149)     LOS: 5 days     Barb Merino, MD [facility] Available via Epic secure chat 7am-7pm After these hours, please refer to coverage provider listed on [email or web address] 04/07/2022, 1:14 PM

## 2022-04-07 NOTE — TOC Progression Note (Addendum)
Transition of Care [facility]) - Progression Note    Patient Details  Name: Dennis [name] MRN: [id number] Date of Birth: Dec 03, 1945  Transition of [facility]) CM/SW [place], [place] Phone Number: 04/07/2022, 11:43 AM  Clinical Narrative:     Update- CSW spoke with Mervin Kung from [facility] who confirmed they cannot offer SNF bed for patient. CSW informed patients spouse. Patients spouse accepted SNF bed offer with [facility] and Re[facility].CSW added facility to patients insurance authorization.  CSW spoke with Mervin Kung at [facility] rehab who informed CSW they are still reviewing SNF referral. Mervin Kung confirmed she will call CSW back today with decision. CSW will continue to follow and assist with patients dc planning needs.  Expected Discharge Plan: [place] Barriers to Discharge: Continued Medical Work up  Expected Discharge Plan and Services Expected Discharge Plan: [place]   Discharge Planning Services: CM Consult Post Acute Care Choice: [place] Living arrangements for the past 2 months: Single Family Home                           HH Arranged: RN, Disease Management, PT, OT, Nurse's Aide HH Agency: Other - See comment ([place]) Date [facility] Agency Contacted: 04/04/22 Time HH Agency Contacted: 69 Representative spoke with at [place]: [place] ([place]) Interventions    Readmission Risk Interventions    04/04/2022   12:57 PM  Readmission Risk Prevention Plan  Transportation Screening Complete  PCP or Specialist Appt within 3-5 Days Complete  HRI or [place] Complete  Social Work Consult for [place] Planning/Counseling Complete  Palliative Care Screening Not Applicable  Medication Review Press photographer) Referral to Pharmacy

## 2022-04-07 NOTE — Progress Notes (Addendum)
Progress Note  Patient Name: Dennis [name] Date of Encounter: 04/07/2022  [facility] Cardiologist: Carlyle Dolly, MD   Subjective   Denies any CP or SOB.   Inpatient Medications    Scheduled Meds:  clopidogrel  75 mg Oral Daily   insulin aspart  0-15 Units Subcutaneous TID WC   insulin aspart  0-5 Units Subcutaneous QHS   insulin glargine-yfgn  8 Units Subcutaneous QHS   metoprolol tartrate  50 mg Oral BID   nicotine  21 mg Transdermal Daily   rosuvastatin  20 mg Oral Daily   sertraline  50 mg Oral Daily   sodium chloride flush  3 mL Intravenous Q12H   tamsulosin  0.4 mg Oral Daily   valproic acid  500 mg Oral BID   Warfarin - Pharmacist Dosing Inpatient   Does not apply q1600   Continuous Infusions:  sodium chloride     amiodarone 30 mg/hr (04/07/22 0050)   PRN Meds: sodium chloride, acetaminophen, nitroGLYCERIN, ondansetron (ZOFRAN) IV, sodium chloride flush   Vital Signs    Vitals:   04/06/22 1924 04/07/22 0028 04/07/22 0343 04/07/22 0738  BP: 127/71 136/75 132/82 111/74  Pulse: 77 75 94 98  Resp: '18 17 20 10  '$ Temp: 98 F (36.7 C) 98.1 F (36.7 C) 98.2 F (36.8 C) 97.7 F (36.5 C)  TempSrc: Oral Oral Oral Oral  SpO2: 92% 93% 95% 96%  Weight:   81.7 kg   Height:        Intake/Output Summary (Last 24 hours) at 04/07/2022 0936 Last data filed at 04/07/2022 0029 Gross per 24 hour  Intake 639.37 ml  Output 875 ml  Net -235.63 ml      04/07/2022    3:43 AM 04/06/2022    3:19 AM 04/02/2022    5:00 PM  Last 3 Weights  Weight (lbs) 180 lb 1.9 oz 174 lb 2.6 oz 189 lb 6 oz  Weight (kg) 81.7 kg 79 kg 85.9 kg      Telemetry    Atrial fibrillation, rate controleld.  - Personally Reviewed  ECG    Atrial fibrillation with Q wave in the inferior leads, ST depression in the lateral leads. - Personally Reviewed  Physical Exam   GEN: No acute distress.   Neck: No JVD Cardiac: irregularly irregular, no murmurs, rubs, or gallops.  Respiratory:  Clear to auscultation bilaterally. GI: Soft, nontender, non-distended  MS: No edema; No deformity. Neuro:  Nonfocal  Psych: Normal affect   Labs    High Sensitivity Troponin:   Recent Labs  Lab 04/02/22 1656 04/02/22 1826  TROPONINIHS 1,335* 1,635*     Chemistry Recent Labs  Lab 04/02/22 1656 04/02/22 2217 04/05/22 0703 04/06/22 0315 04/07/22 0213  NA 138   < > 141 139 141  K 5.8*   < > 3.2* 3.7 3.6  CL 106   < > 100 102 104  CO2 18*   < > '25 26 23  '$ GLUCOSE 246*   < > 107* 111* 127*  BUN 54*   < > 46* 45* 35*  CREATININE 2.17*   < > 2.08* 2.30* 2.04*  CALCIUM 8.6*   < > 8.2* 8.2* 8.6*  MG  --    < > 2.0 2.1 1.9  PROT 6.8  --   --   --   --   ALBUMIN 2.9*  --   --   --   --   AST 26  --   --   --   --  ALT 29  --   --   --   --   ALKPHOS 78  --   --   --   --   BILITOT 0.7  --   --   --   --   GFRNONAA 31*   < > 33* 29* 34*  ANIONGAP 14   < > 16* 11 14   < > = values in this interval not displayed.    Lipids  Recent Labs  Lab 04/02/22 1656  CHOL 128  TRIG 58  HDL 44  LDLCALC 72  CHOLHDL 2.9    Hematology Recent Labs  Lab 04/05/22 0703 04/06/22 0315 04/07/22 0213  WBC 8.5 7.9 6.7  RBC 3.14* 3.25* 3.22*  HGB 9.6* 9.7* 9.6*  HCT 27.9* 28.8* 28.4*  MCV 88.9 88.6 88.2  MCH 30.6 29.8 29.8  MCHC 34.4 33.7 33.8  RDW 13.1 13.2 13.2  PLT 260 281 285   Thyroid  Recent Labs  Lab 04/02/22 1656 04/05/22 0703  TSH 5.471*  --   FREET4  --  0.95    BNP Recent Labs  Lab 04/02/22 1656  BNP 1,319.0*    DDimer No results for input(s): "DDIMER" in the last 168 hours.   Radiology    DG Chest 2 View  Result Date: 04/05/2022 CLINICAL DATA:  Shortness of breath.  History of pleural effusion. EXAM: CHEST - 2 VIEW COMPARISON:  04/02/2022 FINDINGS: Stable cardiac enlargement. There is a moderate right pleural effusion with near complete atelectasis of the right lower lobe. Small left pleural effusion identified. No interstitial edema. IMPRESSION: 1. Moderate  right pleural effusion and small left pleural effusion. 2. Right lower lobe atelectasis. Electronically Signed   By: Kerby Moors M.D.   On: 04/05/2022 11:24    Cardiac Studies   2D echo 04/04/22   1. Left ventricular ejection fraction, by estimation, is 50 to 55%. The  left ventricle has low normal function. The left ventricle demonstrates  regional wall motion abnormalities (see scoring diagram/findings for  description). Left ventricular diastolic   function could not be evaluated. There is mild hypokinesis of the left  ventricular, entire anteroseptal wall and anterior wall.   2. Right ventricular systolic function is normal. The right ventricular  size is normal. There is normal pulmonary artery systolic pressure.   3. A small pericardial effusion is present.   4. The mitral valve is grossly normal. Trivial mitral valve  regurgitation. No evidence of mitral stenosis.   5. The aortic valve is grossly normal. Aortic valve regurgitation is not  visualized. No aortic stenosis is present.   6. The inferior vena cava is normal in size with <50% respiratory  variability, suggesting right atrial pressure of 8 mmHg.   Comparison(s): Changes from prior study are noted.   Conclusion(s)/Recommendation(s): Technically challenging study, as best  images are subcostal. Overall low normal EF. Difficult to assess regional  wall motion, but there is mild hypokinesis in the visualized portions of  the anteroseptal and anterior wall.   In atrial fibrillation with RVR throughout study.   Patient Profile     76 y.o. male with PMH of CAD, HTN, HLD, DM II, PAF on coumadin, carotid artery disease, PAD s/p R AKA 01/2022 and tobacco abuse presented with chest pain and SOB. Found to have elevated troponin and acute on chronic diastolic CHF  Assessment & Plan    NSTEMI  - peak trop 1634, patient is a poor candidate for invasive work up such as  cath due to dementia, intermittent agitation, requiring  restrains, combative and confusion  - Plavix added on top of coumadin, will hold off on adding ASA given the need for coumadin. Crestor increased to '20mg'$  daily  Acute on chronic diastolic CHF: Euvolemic on exam today, diuretic off.   PAF on coumadin  - went into afib RVR on 7/16. TSH mildly elevated, normal free T4. Digoxin stopped due to renal insufficiency  - started on IV amiodarone 04/04/2022 for recurrent afib  - more alert today, consider a trial of oral medication, consider switch IV amiodarone to '200mg'$  BID.   QT prolongation: improved with discontinuation of Haldol  Acute on chronic renal insufficiency  DM II  Cognitive deficit with intermittent agitation  GOC: seen by palliative care service to discuss Rafael Gonzalez, further discussion tomorrow.       For questions or updates, please contact [place] Please consult [email or web address] for contact info under        Signed, Almyra Deforest, [place]  04/07/2022, 9:36 AM    Patient seen and examined with Almyra Deforest PA.  Agree as above, with the following exceptions and changes as noted below. No chest pain or palpitations. Wife at bedside. Gen: NAD, CV: iRRR, no murmurs, Lungs: clear, Abd: soft, Extrem: Warm, well perfused, no edema, Neuro/Psych: flat affect. Oriented x 2. All available labs, radiology testing, previous records reviewed. Will transition to oral amiodarone. Rate are better today but remains in AF. Continue medical management of NSTEMI.  Elouise Munroe, MD

## 2022-04-08 DIAGNOSIS — S90415A Abrasion, left lesser toe(s), initial encounter: Secondary | ICD-10-CM

## 2022-04-08 DIAGNOSIS — I249 Acute ischemic heart disease, unspecified: Secondary | ICD-10-CM | POA: Diagnosis not present

## 2022-04-08 LAB — MAGNESIUM: Magnesium: 2.3 mg/dL (ref 1.7–2.4)

## 2022-04-08 LAB — PROTIME-INR
INR: 3.1 — ABNORMAL HIGH (ref 0.8–1.2)
Prothrombin Time: 31.3 seconds — ABNORMAL HIGH (ref 11.4–15.2)

## 2022-04-08 LAB — BASIC METABOLIC PANEL
Anion gap: 12 (ref 5–15)
BUN: 35 mg/dL — ABNORMAL HIGH (ref 8–23)
CO2: 28 mmol/L (ref 22–32)
Calcium: 8.6 mg/dL — ABNORMAL LOW (ref 8.9–10.3)
Chloride: 100 mmol/L (ref 98–111)
Creatinine, Ser: 2.04 mg/dL — ABNORMAL HIGH (ref 0.61–1.24)
GFR, Estimated: 34 mL/min — ABNORMAL LOW (ref >60)
Glucose, Bld: 83 mg/dL (ref 70–99)
Potassium: 3.5 mmol/L (ref 3.5–5.1)
Sodium: 140 mmol/L (ref 135–145)

## 2022-04-08 LAB — GLUCOSE, CAPILLARY
Glucose-Capillary: 86 mg/dL (ref 70–99)
Glucose-Capillary: 116 mg/dL — ABNORMAL HIGH (ref 70–99)
Glucose-Capillary: 202 mg/dL — ABNORMAL HIGH (ref 70–99)
Glucose-Capillary: 85 mg/dL (ref 70–99)

## 2022-04-08 LAB — CBC
HCT: 28.1 % — ABNORMAL LOW (ref 39.0–52.0)
Hemoglobin: 9.2 g/dL — ABNORMAL LOW (ref 13.0–17.0)
MCH: 29.5 pg (ref 26.0–34.0)
MCHC: 32.7 g/dL (ref 30.0–36.0)
MCV: 90.1 fL (ref 80.0–100.0)
Platelets: 247 10*3/uL (ref 150–400)
RBC: 3.12 MIL/uL — ABNORMAL LOW (ref 4.22–5.81)
RDW: 13.2 % (ref 11.5–15.5)
WBC: 7.7 10*3/uL (ref 4.0–10.5)
nRBC: 0.3 % — ABNORMAL HIGH (ref 0.0–0.2)

## 2022-04-08 MED ORDER — AMIODARONE HCL 200 MG PO TABS
200.0000 mg | ORAL_TABLET | Freq: Two times a day (BID) | ORAL | Status: DC
  Administered 2022-04-08 – 2022-04-10 (×5): 200 mg via ORAL
  Filled 2022-04-08 (×5): qty 1

## 2022-04-08 MED ORDER — WARFARIN SODIUM 1 MG PO TABS
1.0000 mg | ORAL_TABLET | Freq: Once | ORAL | Status: AC
  Administered 2022-04-08: 1 mg via ORAL
  Filled 2022-04-08: qty 1

## 2022-04-08 MED ORDER — ORAL CARE MOUTH RINSE: 15.0000 mL | OROMUCOSAL | Status: DC | PRN

## 2022-04-08 NOTE — TOC Progression Note (Signed)
Transition of Care [facility]) - Progression Note    Patient Details  Name: Dennis [name] MRN: [id number] Date of Birth: [birth year]  Transition of Care [facility]) CM/SW [place], [place] Phone Number: 04/08/2022, 1:40 PM  Clinical Narrative:     CSW received insurance authorization approval for patient. Plan Auth ID# [id number] [place] [phone]. Insurance authorization approval from 7/20-7/24.  Patient has SNF bed at [facility]. CSW spoke with Wells Guiles at [facility] who confirmed she will make referral for outpatient palliative services for patient. Wells Guiles with [facility] confirmed they can accept patient when medically ready. CSW informed MD. CSW will continue to follow and assist with patients dc planning needs.  Expected Discharge Plan: [place] Barriers to Discharge: Continued Medical Work up  Expected Discharge Plan and Services Expected Discharge Plan: [place]   Discharge Planning Services: CM Consult Post Acute Care Choice: [place] Living arrangements for the past 2 months: Single Family Home                           HH Arranged: RN, Disease Management, PT, OT, Nurse's Aide HH Agency: Other - See comment ([place]) Date [facility] Agency Contacted: 04/04/22 Time HH Agency Contacted: 34 Representative spoke with at [place]: [place] ([place]) Interventions    Readmission Risk Interventions    04/04/2022   12:57 PM  Readmission Risk Prevention Plan  Transportation Screening Complete  PCP or Specialist Appt within 3-5 Days Complete  HRI or [place] Complete  Social Work Consult for [place] Planning/Counseling Complete  Palliative Care Screening Not Applicable  Medication Review Press photographer) Referral to Pharmacy

## 2022-04-08 NOTE — Progress Notes (Signed)
PROGRESS NOTE    Dennis [name]  JSH:[id number] DOB: [birth year] DOA: 04/02/2022 PCP: Glenda Chroman, MD    Brief Narrative:  Dennis [name] is a 76 y.o. 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  He reports chest pain started 2 days ago.  It may have been exertional.  It comes and goes.  Better with rest.  Worse with doing thing and at nights.   Pain is substernal. He quit smoking about 2 weeks ago.   -Patient was found to have elevated troponin, concern for NSTEMI, he was admitted for further work-up as transfer from [facility].  Remains confused.  Followed by cardiology.   Assessment and plan of care:  NSTEMI known history of CAD, hyperlipidemia -Troponin peaked at 1.635 -Management per cardiology, poor candidate for cardiac cath given his dementia, intermittent agitation, pulling lines, as well secondary due to CKD, anemia and sacral pressure ulcers, and overall poor functional status. -Continue with conservative management, started on Plavix.  Was on heparin and now therapeutic on Coumadin. -On metoprolol, uptitrated by cardiology. -He is on statin, rosuvastatin  titrated from 5 to 20 mg.  HTN -Blood pressure is soft, continue to hold lisinopril especially with CKD  HLD -LDL 72, rosuvastatin titrated from 5 to 20 mg.  Is stable.   DM -A1C: 5.4-- improved from prior -Hold glipizide -Continue Lantus qhs -SSI   A-fib with RVR -Heart rate was uncontrolled and started on amiodarone drip. -Digoxin discontinued.  Now back on oral amiodarone. -On warfarin for anticoagulation and therapeutic.   Dementia/Mood d/o, failure to thrive, goal of care: -Persistently confused. -High risk for hospital delirium, with significant confusion overlying, pulling his lines and Foley catheter. -He does have QT  prolongation, however benefits outweigh risk.   -Now back on Seroquel, Depakote, Zoloft.  Will monitor and may need to go up on medications. -Goal of care discussion held with palliative care team.  SNF placement.  DNR and comfort care measures.  PVD -s/p R AKA, well healed   Stage 3a CKD -At about baseline. -Hold lisinopril -Monitor.   Sacral pressure ulcer -stage 2 sacral pressure ulcer, present on admission -Local wound care.   Acute urinary retention -Foley placed 7/15 for retention, currently able to urinate normally.  Hypokalemia -Repleted, continue to monitor closely due to prolonged QTc    DVT prophylaxis: warfarin (COUMADIN) tablet 1 mg    Code Status: DNR/comfort care Family communication: None.   Disposition Plan:  Level of care: MedSurg Status is: Inpatient Remains inpatient appropriate because: Transfer to SNF.    Consultants:  Cardiology Palliative care   Subjective:  Seen and examined.  Impulsive.  Patient tells me to let him get out of the bed that he wants to go to his room which is on the first floor.  Pressured speech.  Unaware about his ability to not able to walk.  Objective: Vitals:   04/07/22 2330 04/08/22 0355 04/08/22 0439 04/08/22 0743  BP: (!) 116/43 131/61  (!) 127/59  Pulse: (!) 55 (!) 57  61  Resp: '13 14  11  '$ Temp: 97.8 F (36.6 C) 97.7 F (36.5 C)  98.8 F (37.1 C)  TempSrc: Oral Oral  Oral  SpO2: 97% 100%  94%  Weight:   79.7 kg   Height:        Intake/Output Summary (  Last 24 hours) at 04/08/2022 1130 Last data filed at 04/08/2022 0600 Gross per 24 hour  Intake 617.45 ml  Output 350 ml  Net 267.45 ml   Filed Weights   04/06/22 0319 04/07/22 0343 04/08/22 0439  Weight: 79 kg 81.7 kg 79.7 kg    Examination:  General: Chronically sick looking.  On room air.  Not in any distress. Alert and awake but confused.  Impulsive.  Pressured speech.  Distracted. Cardiovascular: S1-S2 normal.  Irregularly rate and  rhythm. Respiratory: No added sounds. Gastrointestinal: Soft.  Nontender.  Bowel sound present. Ext: No edema or cyanosis.    Data Reviewed: I have personally reviewed following labs and imaging studies  CBC: Recent Labs  Lab 04/04/22 0346 04/05/22 0703 04/06/22 0315 04/07/22 0213 04/08/22 0249  WBC 10.7* 8.5 7.9 6.7 7.7  HGB 9.3* 9.6* 9.7* 9.6* 9.2*  HCT 28.4* 27.9* 28.8* 28.4* 28.1*  MCV 91.9 88.9 88.6 88.2 90.1  PLT 269 260 281 285 706   Basic Metabolic Panel: Recent Labs  Lab 04/02/22 2217 04/03/22 0326 04/04/22 0346 04/05/22 0703 04/06/22 0315 04/07/22 0213 04/08/22 0249  NA 136   < > 139 141 139 141 140  K 6.0*   < > 3.9 3.2* 3.7 3.6 3.5  CL 103   < > 105 100 102 104 100  CO2 20*   < > '27 25 26 23 28  '$ GLUCOSE 256*   < > 118* 107* 111* 127* 83  BUN 55*   < > 50* 46* 45* 35* 35*  CREATININE 2.14*   < > 1.92* 2.08* 2.30* 2.04* 2.04*  CALCIUM 8.3*   < > 8.2* 8.2* 8.2* 8.6* 8.6*  MG 2.1  --   --  2.0 2.1 1.9 2.3  PHOS 4.1  --   --   --   --   --   --    < > = values in this interval not displayed.   GFR: Estimated Creatinine Clearance: 35.8 mL/min (A) (by C-G formula based on SCr of 2.04 mg/dL (H)). Liver Function Tests: Recent Labs  Lab 04/02/22 1656  AST 26  ALT 29  ALKPHOS 78  BILITOT 0.7  PROT 6.8  ALBUMIN 2.9*   No results for input(s): "LIPASE", "AMYLASE" in the last 168 hours. No results for input(s): "AMMONIA" in the last 168 hours. Coagulation Profile: Recent Labs  Lab 04/04/22 0346 04/05/22 0703 04/06/22 0315 04/07/22 0213 04/08/22 0249  INR 1.5* 1.7* 2.1* 2.6* 3.1*   Cardiac Enzymes: No results for input(s): "CKTOTAL", "CKMB", "CKMBINDEX", "TROPONINI" in the last 168 hours. BNP (last 3 results) No results for input(s): "PROBNP" in the last 8760 hours. HbA1C: No results for input(s): "HGBA1C" in the last 72 hours.  CBG: Recent Labs  Lab 04/07/22 0717 04/07/22 1148 04/07/22 1638 04/07/22 2257 04/08/22 0821  GLUCAP 119*  149* 176* 94 86   Lipid Profile: No results for input(s): "CHOL", "HDL", "LDLCALC", "TRIG", "CHOLHDL", "LDLDIRECT" in the last 72 hours.  Thyroid Function Tests: No results for input(s): "TSH", "T4TOTAL", "FREET4", "T3FREE", "THYROIDAB" in the last 72 hours.  Anemia Panel: No results for input(s): "VITAMINB12", "FOLATE", "FERRITIN", "TIBC", "IRON", "RETICCTPCT" in the last 72 hours. Sepsis Labs: No results for input(s): "PROCALCITON", "LATICACIDVEN" in the last 168 hours.  No results found for this or any previous visit (from the past 240 hour(s)).       Radiology Studies: No results found.      Scheduled Meds:  amiodarone  200 mg Oral BID  clopidogrel  75 mg Oral Daily   insulin aspart  0-15 Units Subcutaneous TID WC   insulin aspart  0-5 Units Subcutaneous QHS   insulin glargine-yfgn  8 Units Subcutaneous QHS   magnesium oxide  400 mg Oral Daily   metoprolol tartrate  50 mg Oral BID   nicotine  21 mg Transdermal Daily   QUEtiapine  100 mg Oral Daily   rosuvastatin  20 mg Oral Daily   sertraline  50 mg Oral Daily   sodium chloride flush  3 mL Intravenous Q12H   tamsulosin  0.4 mg Oral Daily   valproic acid  500 mg Oral BID   warfarin  1 mg Oral ONCE-1600   Warfarin - Pharmacist Dosing Inpatient   Does not apply q1600   Continuous Infusions:  sodium chloride       LOS: 6 days     Barb Merino, MD [facility] Available via Epic secure chat 7am-7pm After these hours, please refer to coverage provider listed on [email or web address] 04/08/2022, 11:30 AM

## 2022-04-08 NOTE — Progress Notes (Signed)
Daily Progress Note   Patient Name: Dennis [name]       Date: 04/08/2022 DOB: 25-Oct-1945  Age: 76 y.o. MRN#: [id number] Attending Physician: Barb Merino, MD Primary Care Physician: Glenda Chroman, MD Admit Date: 04/02/2022  Reason for Consultation/Follow-up: Establishing goals of care  Patient Profile/HPI:  76 y.o. 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  He was transferred from [facility].  Work-up reveals NSTEMI, elevated troponins, he is a poor candidate for cardiac cath given his dementia he has been intermittently agitated.  He is noted to have sacral pressure ulcers and poor functional status.  Cardiology recommended conservative medical medical management.  He is on an amiodarone drip for A-fib with RVR.  Palliative consulted for goals of care.    Subjective: Chart reviewed including labs progress notes.  Medication use reviewed.  Evaluated patient he was awake and alert he was not oriented. He was pleasant. He complained of his left second toe hurting.  On exam he has a small abrasion with crusted blood.  I cleaned this with sterile saline and gauze and applied a Band-Aid. I called and spoke with his wife, Theodoro Koval.  She did not have any questions or concerns regarding her discussion yesterday.  She expressed some disappointment in him having to go to [place], she would have preferred him to go to [place].  She is worried that he will be upset about going back to [facility].  We discussed that he is not likely to know where he  is.  He did not know his location today.  This gave her some comfort.   Review of Systems  Unable to perform ROS: Mental status change     Physical Exam Constitutional:      Appearance: He is ill-appearing.  Cardiovascular:     Rate and Rhythm: Normal rate.  Skin:    General: Skin is warm and dry.     Comments: Abrasion on L second toe- crusted blood  Neurological:     Mental Status: He is alert.  Vital Signs: BP 111/72 (BP Location: Right Arm)   Pulse (!) 58   Temp 98.6 F (37 C) (Oral)   Resp 12   Ht '6\' 1"'$  (1.854 m)   Wt 79.7 kg   SpO2 94%   BMI 23.18 kg/m  SpO2: SpO2: 94 % O2 Device: O2 Device: Room Air O2 Flow Rate: O2 Flow Rate (L/min): 2 L/min  Intake/output summary:  Intake/Output Summary (Last 24 hours) at 04/08/2022 1341 Last data filed at 04/08/2022 0600 Gross per 24 hour  Intake 617.45 ml  Output 350 ml  Net 267.45 ml   LBM: Last BM Date : 04/05/22 Baseline Weight: Weight: 85.9 kg Most recent weight: Weight: 79.7 kg       Palliative Assessment/Data: PPS: 40%      Patient Active Problem List   Diagnosis Date Noted   ACS (acute coronary syndrome) ([place]) 04/02/2022   Type 2 diabetes mellitus with complication, with long-term current use of insulin ([place]) 04/02/2022   Dementia with behavioral disturbance ([place]) 04/02/2022   Chronic kidney disease, stage 3a ([place]) 04/02/2022   PAD (peripheral artery disease) ([place]) 07/01/2019   Palpitations 01/23/2018   Chronic anticoagulation 01/23/2018   Chronic back pain 01/23/2018   Atrial fibrillation with RVR (HCC)    PAF (paroxysmal atrial fibrillation) ([place]) 12/15/2017   Delirium    Closed right hip fracture ([place]) 12/09/2017   CAD S/P percutaneous coronary angioplasty 12/04/2017   Hypertension 12/04/2017   Hyperlipidemia 11/15/2017   Tobacco abuse 11/13/2017   History of ST elevation myocardial infarction (STEMI) 11/13/2017   Insulin dependent diabetes mellitus with complications 62/83/1517     Palliative Care Assessment & Plan    Assessment/Recommendations/Plan  Continue current plan of care Plan for discharge to SNF with palliative to follow-he will eventually need assisted memory care or long-term care   Code Status: DNR  Prognosis:  Unable to determine  Discharge Planning: [place] for rehab with Palliative care service follow-up  Care plan was discussed with patient's spouse  Thank you for allowing the Palliative Medicine Team to assist in the care of this patient.   Greater than 50%  of this time was spent counseling and coordinating care related to the above assessment and plan.  Mariana Kaufman, AGNP-C Palliative Medicine   Please contact Palliative Medicine Team phone at [phone] for questions and concerns.

## 2022-04-08 NOTE — Progress Notes (Signed)
ANTICOAGULATION CONSULT NOTE  Pharmacy Consult for warfarin  Indication: atrial fibrillation  Allergies  Allergen Reactions   Betadine [Povidone Iodine] Swelling    Facial swelling   Fish Allergy Swelling    Facial swelling. Many years ago.   Iodine Swelling    Facial swelling   Shellfish Allergy Swelling    Facial swelling   Sulfamethoxazole-Trimethoprim Swelling    Facial swelling   Gabapentin Other (See Comments)    Unknown reaction   Chlorhexidine     unknown   Trimethoprim Swelling    Facial swelling - Doesn't think he ever took this by itself.   Tramadol Other (See Comments)    Makes him crazy    Patient Measurements: Height: '6\' 1"'$  (185.4 cm) Weight: 79.7 kg (175 lb 11.3 oz) IBW/kg (Calculated) : 79.9  Vital Signs: Temp: 97.7 F (36.5 C) (07/21 0355) Temp Source: Oral (07/21 0355) BP: 131/61 (07/21 0355) Pulse Rate: 57 (07/21 0355)  Labs: Recent Labs    04/06/22 0315 04/07/22 0213 04/08/22 0249  HGB 9.7* 9.6* 9.2*  HCT 28.8* 28.4* 28.1*  PLT 281 285 247  LABPROT 23.3* 27.7* 31.3*  INR 2.1* 2.6* 3.1*  HEPARINUNFRC 0.46  --   --   CREATININE 2.30* 2.04* 2.04*     Estimated Creatinine Clearance: 35.8 mL/min (A) (by C-G formula based on SCr of 2.04 mg/dL (H)).   Medical History: Past Medical History:  Diagnosis Date   Arthritis    CAD (coronary artery disease)    a. s/p prior stenting of RCA and OM2 in 2006 b. s/p STEMI in 10/2017 with stenting of mid-RCA and PDA with DES x2   Chronic pain    Diabetes mellitus ([place])    Hyperlipidemia    Hypertension    STEMI (ST elevation myocardial infarction) ([place])    Tobacco use     Assessment: 76 y/o male presented with chest pain to OSH ([place]) and transferred to [facility]. Pt on warfarin PTA for AFib which was held on admit for possible cath, now planning med management. Warfarin resumed, heparin bridge continued with low INR.  Heparin stopped 7/19 due to therapeutic INR. INR today is slightly  above goal at 3.1, CBC stable. With new amiodarone will need to reduce warfarin dose - recommend 2.'5mg'$  daily at discharge.  Home warfarin '5mg'$  MWF, 2.'5mg'$  AODs.   Goal of Therapy:  INR 2-3 Monitor platelets by anticoagulation protocol: Yes   Plan:  Warfarin '1mg'$  x1 tonight Daily INR, CBC, s/sx of bleeding    Arrie Senate, PharmD, BCPS, [facility] Clinical Pharmacist [phone] Please check AMION for all [place] numbers 04/08/2022
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
## 2022-04-09 ENCOUNTER — Inpatient Hospital Stay (HOSPITAL_COMMUNITY): Payer: Medicare HMO

## 2022-04-09 DIAGNOSIS — I249 Acute ischemic heart disease, unspecified: Secondary | ICD-10-CM | POA: Diagnosis not present

## 2022-04-09 LAB — BASIC METABOLIC PANEL
Anion gap: 10 (ref 5–15)
BUN: 29 mg/dL — ABNORMAL HIGH (ref 8–23)
CO2: 26 mmol/L (ref 22–32)
Calcium: 8.3 mg/dL — ABNORMAL LOW (ref 8.9–10.3)
Chloride: 103 mmol/L (ref 98–111)
Creatinine, Ser: 2.01 mg/dL — ABNORMAL HIGH (ref 0.61–1.24)
GFR, Estimated: 34 mL/min — ABNORMAL LOW (ref >60)
Glucose, Bld: 100 mg/dL — ABNORMAL HIGH (ref 70–99)
Potassium: 3.4 mmol/L — ABNORMAL LOW (ref 3.5–5.1)
Sodium: 139 mmol/L (ref 135–145)

## 2022-04-09 LAB — CBC
HCT: 30.3 % — ABNORMAL LOW (ref 39.0–52.0)
Hemoglobin: 10.3 g/dL — ABNORMAL LOW (ref 13.0–17.0)
MCH: 30.3 pg (ref 26.0–34.0)
MCHC: 34 g/dL (ref 30.0–36.0)
MCV: 89.1 fL (ref 80.0–100.0)
Platelets: 284 10*3/uL (ref 150–400)
RBC: 3.4 MIL/uL — ABNORMAL LOW (ref 4.22–5.81)
RDW: 12.9 % (ref 11.5–15.5)
WBC: 8.4 10*3/uL (ref 4.0–10.5)
nRBC: 0 % (ref 0.0–0.2)

## 2022-04-09 LAB — GLUCOSE, CAPILLARY
Glucose-Capillary: 85 mg/dL (ref 70–99)
Glucose-Capillary: 161 mg/dL — ABNORMAL HIGH (ref 70–99)
Glucose-Capillary: 131 mg/dL — ABNORMAL HIGH (ref 70–99)
Glucose-Capillary: 41 mg/dL — CL (ref 70–99)
Glucose-Capillary: 94 mg/dL (ref 70–99)

## 2022-04-09 LAB — MAGNESIUM: Magnesium: 2.2 mg/dL (ref 1.7–2.4)

## 2022-04-09 LAB — PROTIME-INR
INR: 2.9 — ABNORMAL HIGH (ref 0.8–1.2)
Prothrombin Time: 30 seconds — ABNORMAL HIGH (ref 11.4–15.2)

## 2022-04-09 MED ORDER — DEXTROSE 50 % IV SOLN
1.0000 | Freq: Once | INTRAVENOUS | Status: AC
Start: 2022-04-09 — End: 2022-04-09
  Administered 2022-04-09: 50 mL via INTRAVENOUS

## 2022-04-09 MED ORDER — INSULIN ASPART 100 UNIT/ML IJ SOLN
0.0000 [IU] | Freq: Three times a day (TID) | INTRAMUSCULAR | Status: DC
  Administered 2022-04-10: 2 [IU] via SUBCUTANEOUS

## 2022-04-09 MED ORDER — AMIODARONE HCL 200 MG PO TABS: 200.0000 mg | ORAL_TABLET | Freq: Two times a day (BID) | ORAL | Status: DC

## 2022-04-09 MED ORDER — DEXTROSE 50 % IV SOLN
INTRAVENOUS | Status: AC
  Filled 2022-04-09: qty 50

## 2022-04-09 MED ORDER — VALPROIC ACID 250 MG/5ML PO SOLN: 500.0000 mg | Freq: Two times a day (BID) | ORAL | Status: DC

## 2022-04-09 MED ORDER — WARFARIN SODIUM 2.5 MG PO TABS: 2.5000 mg | ORAL_TABLET | Freq: Every day | ORAL | Status: DC

## 2022-04-09 MED ORDER — ROSUVASTATIN CALCIUM 20 MG PO TABS: 20.0000 mg | ORAL_TABLET | Freq: Every day | ORAL | 0 refills | Status: AC

## 2022-04-09 MED ORDER — WARFARIN SODIUM 2.5 MG PO TABS
2.5000 mg | ORAL_TABLET | Freq: Every day | ORAL | Status: DC
  Administered 2022-04-09: 2.5 mg via ORAL
  Filled 2022-04-09: qty 1

## 2022-04-09 MED ORDER — INSULIN ASPART 100 UNIT/ML IJ SOLN: 0.0000 [IU] | Freq: Every day | INTRAMUSCULAR | Status: DC

## 2022-04-09 MED ORDER — POTASSIUM CHLORIDE CRYS ER 20 MEQ PO TBCR
40.0000 meq | EXTENDED_RELEASE_TABLET | Freq: Once | ORAL | Status: AC
  Administered 2022-04-09: 40 meq via ORAL
  Filled 2022-04-09: qty 2

## 2022-04-09 MED ORDER — MAGNESIUM OXIDE -MG SUPPLEMENT 400 (240 MG) MG PO TABS: 400.0000 mg | ORAL_TABLET | Freq: Every day | ORAL | Status: DC

## 2022-04-09 MED ORDER — CLOPIDOGREL BISULFATE 75 MG PO TABS: 75.0000 mg | ORAL_TABLET | Freq: Every day | ORAL | Status: DC

## 2022-04-09 NOTE — TOC Progression Note (Signed)
Transition of Care [facility]) - Progression Note    Patient Details  Name: JEEVAN KALLA MRN: [id number] Date of Birth: 09-Jun-1946  Transition of Care [facility]) CM/SW [place], LCSW Phone Number: 04/09/2022, 1:51 PM  Clinical Narrative:     CSW spoke with Wells Guiles at facility and alerted her of delayed DC to possibly tomorrow. Wells Guiles stated that pt could be DC  there tomorrow.   TOC team will continue to assist with discharge planning needs.   Expected Discharge Plan: Skilled Nursing Facility Barriers to Discharge: Barriers Resolved  Expected Discharge Plan and Services Expected Discharge Plan: [place]   Discharge Planning Services: CM Consult Post Acute Care Choice: [place] Living arrangements for the past 2 months: Single Family Home Expected Discharge Date: 04/09/22                         HH Arranged: RN, Disease Management, PT, OT, Nurse's Aide HH Agency: Other - See comment ([place]) Date [place]: 04/04/22 Time HH Agency Contacted: 1300 Representative spoke with at [place]: [place] Determinants of Health ([place]) Interventions    Readmission Risk Interventions    04/04/2022   12:57 PM  Readmission Risk Prevention Plan  Transportation Screening Complete  PCP or Specialist Appt within 3-5 Days Complete  HRI or [place] Complete  Social Work Consult for [place] Planning/Counseling Complete  Palliative Care Screening Not Applicable  Medication Review Press photographer) Referral to Pharmacy

## 2022-04-09 NOTE — Progress Notes (Signed)
Repeat BG 15 min post amp of D50 is 131.

## 2022-04-09 NOTE — Progress Notes (Signed)
Report x1 attempted at [facility]. PTAR scheduled for transport.

## 2022-04-09 NOTE — Progress Notes (Signed)
Pt found on floor as transport was here to discharge him to SNF. Fall was unwitnessed. It appears that the Pt weaseled himself out of the chair alarm belt known as a head-start. The device seems to have malfunctioned as the alarm did not go off. MD aware. Head CT ordered per protocol. CT (-). Pt on observation for 24hrs due to his risk for bleed. He's on Plavix and Coumadin. Call bell placed within reach. Pt back in bed. Alarm on. Door open. Intentional roundings increased to Q30 minutes.

## 2022-04-09 NOTE — Discharge Instructions (Signed)
Heart Failure  SPECIAL INSTRUCTIONS AVOID STRAINING STOP ANY ACTIVITY THAT CAUSES CHEST PAIN, SHORTNESS OF BREATH, DIZZINESS, SWEATING, OR EXCESSIVE WEAKNESS.  SPECIAL INSTRUCTIONS FOR PATIENTS WITH HEART FAILURE: Continue to follow the instructions in your Heart Failure Patient Education information that you received during your hospital stay. Record daily weight on same scale at same time of day. If your doctor did not discuss your diet or activity in the information above, please follow a Heart Healthy Low Sodium diet and increase your activity as you feel able. Call your doctor: (Anytime you feel any of the following symptoms) 3-4 pound weight gain in 1-2 days or 2 pounds overnight Shortness of breath, with or without a dry hacking cough Swelling in the hands, feet or stomach If you have to sleep on extra pillows at night in order to breathe

## 2022-04-09 NOTE — Progress Notes (Signed)
   04/09/22 1210  What Happened  Was fall witnessed? No  Was patient injured? Unsure  Patient found on floor  Found by Staff-comment Judson Roch, RN)  Stated prior activity to/from bed, chair, or stretcher  Follow Up  MD notified Dr Sloan Leiter  Time MD notified 1204  Additional tests Yes-comment (Head CT)  Progress note created (see row info) Yes  Adult Fall Risk Assessment  Risk Factor Category (scoring not indicated) Fall has occurred during this admission (document High fall risk)  Patient Fall Risk Level High fall risk  Adult Fall Risk Interventions  Required Bundle Interventions *See Row Information* High fall risk - low, moderate, and high requirements implemented  Additional Interventions Use of appropriate toileting equipment (bedpan, BSC, etc.);Lap belt while in chair/wheelchair (Rehab only);HeadStart chair sensor (education/return demonstration);Reorient/diversional activities with confused patients  Screening for Fall Injury Risk (To be completed on HIGH fall risk patients) - Assessing Need for Floor Mats  Risk For Fall Injury- Criteria for Floor Mats Confusion/dementia (+NuDESC, CIWA, TBI, etc.)  Will Implement Floor Mats Yes  Pain Assessment  Pain Scale 0-10  Pain Score 0  Patients Stated Pain Goal 0  PAINAD (Pain Assessment in Advanced Dementia)  Breathing 0  Negative Vocalization 0  Facial Expression 0  Body Language 0  Consolability 0  PAINAD Score 0  PCA/Epidural/Spinal Assessment  Respiratory Pattern Regular;Unlabored  Neurological  Neuro (WDL) X  Level of Consciousness Alert  Orientation Level Oriented to person;Oriented to place;Disoriented to time;Disoriented to situation  Armed forces training and education officer Clear  Neuro Symptoms Forgetful  Neuro symptoms relieved by Rest  Glasgow Coma Scale  Eye Opening 4  Best Verbal Response (NON-intubated) 4  Best Motor Response 6  Glasgow Coma Scale Score 14  Musculoskeletal  Musculoskeletal (WDL) X  Assistive  Device BSC  Generalized Weakness Yes  RLE Weight Bearing NWB  Musculoskeletal Details  RLE AKA  Integumentary  Integumentary (WDL) X  Skin Color Appropriate for ethnicity  Skin Condition Dry;Flaky  Skin Integrity Erythema/redness (nothing new)

## 2022-04-09 NOTE — Progress Notes (Signed)
Patient was getting prepared to be discharged to nursing home.  I was called and notified that patient was sitting in couch, next thing he was noted to lay on the floor.  Patient was slightly impulsive.  Patient stated that he walked to the bathroom and hit his head. At the bedside.  Vitals remained stable.  Clinical exam with no evidence of soft tissue injuries or bony injuries. Patient is on Coumadin and Plavix, CT head was performed that looks essentially normal without evidence of bleeding.  Plan: Ground-level fall in a patient on Coumadin and Plavix. -All-time fall precautions.  Delirium precautions. -Monitor in the hospital today.  Cancel discharge planning.  Will reevaluate tomorrow morning and schedule discharge if no complications noted. -Patient is at high risk of fall, injury and bleeding with continuing anticoagulation therapy, however he is going in a controlled environment at a skilled nursing facility and hopefully can be kept on blood thinners.  If he keeps falling, need to stop anticoagulation.

## 2022-04-09 NOTE — Progress Notes (Signed)
Pt's BG is 41. MG notified and 1 amp of D50 given. Pt did not eat and received 3 units of Lispro for lunch. Meal tray was bedside but he didn't touch it. MD aware. Long-acting insulin dc'd. Will recheck BG post 15 minutes.

## 2022-04-09 NOTE — Progress Notes (Deleted)
Final

## 2022-04-09 NOTE — Discharge Summary (Signed)
Physician Discharge Summary  [name] NAT:[id number] DOB: 1946/01/24 DOA: 04/02/2022  PCP: Glenda Chroman, MD  Admit date: 04/02/2022 Discharge date: 04/09/2022  Admitted From: Home Disposition: Skilled nursing facility  Recommendations for Outpatient Follow-up:  Follow up with PCP in 1-2 weeks Please obtain BMP/CBC in one week Consult palliative care team on arrival to a skilled [place]: N/A Equipment/Devices: N/A  Discharge Condition: Fair CODE STATUS: DNR/DNI Diet recommendation: Low-salt diet  Discharge summary: Dennis [name] is a 76 y.o. 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  He reported chest pain started 2 days ago.  Patient was found to have elevated troponin, concern for NSTEMI, he was admitted for further work-up as transfer from [facility].  Remained confused.  Followed by cardiology.  Medical treatment decided.  Also followed by palliative care team.  Able to go to a skilled nursing facility today.     Assessment and plan of care:   NSTEMI with known history of CAD, hyperlipidemia -Troponin peaked at 1.635 -Seen by cardiology.  Poor candidate for cardiac cath given his dementia, intermittent agitation and chronic kidney disease overall poor functional status. -Continue with conservative management, started on Plavix.  Was on heparin and now therapeutic on Coumadin. -On metoprolol -He is on statin, rosuvastatin  titrated from 5 to 20 mg.   HTN -Blood pressure stable now.   HLD -LDL 72, rosuvastatin titrated from 5 to 20 mg.  Is stable.   DM -A1C: 5.4-- improved from prior -Resume glipizide.  Monitor for hypoglycemic symptoms.  Keep on sliding scale insulin at the nursing home.   A-fib with RVR -Heart rate was uncontrolled and started on amiodarone  drip. -Digoxin discontinued.  Now on oral amiodarone. -On warfarin for anticoagulation and therapeutic.   Dementia/Mood d/o, failure to thrive, goal of care: -Persistently impulsive.  Intermittently confused. -High risk for hospital delirium, with significant confusion overlying. -He does have QT prolongation, however benefits outweigh risk.   -Now back on Seroquel, Depakote and Zoloft.  Behavior is controlled. -Goal of care discussion held with palliative care team.  SNF placement.  DNR and comfort care measures if further worsens. -Consult palliative care team at the skilled nursing rehab.   PVD -s/p R AKA, well healed   Stage 3a CKD -At about baseline. -Hold lisinopril -Monitor.   Sacral pressure ulcer -stage 2 sacral pressure ulcer, present on admission -Local wound care.   Remains in poor functional status, however stable today.  Plan is to attempt to rehab and ongoing follow-up by palliative care team at the skilled nursing facility.  If further worsening, he will benefit with hospice level of care.  If he gets better at rehab, he may need long-term care at assisted living level of care.   Discharge Diagnoses:  Principal Problem:   ACS (acute coronary syndrome) ([place]) Active Problems:   Tobacco abuse   Hyperlipidemia   Hypertension   PAF (paroxysmal atrial fibrillation) (HCC)   PAD (peripheral artery disease) (HCC)   Type 2 diabetes mellitus with complication, with long-term current use of insulin (HCC)   Dementia with behavioral disturbance (HCC)   Chronic kidney disease, stage 3a ([place])    Discharge Instructions  Discharge Instructions     Diet - low sodium heart healthy   Complete by: As directed    Diet  Carb Modified   Complete by: As directed    Discharge wound care:   Complete by: As directed    Wound care to Stage 3 pressure injury to coccyx: Cleanse with soap and water, rinse and dry. Cover lesion with a size appropriate piece of silver hydrofiber  Kellie Simmering # [phone]) and top with a dry gauze 2x2. Cover with a silicone bordered foam for the sacrum placed so that the "tip" points away form the anus. Change silver daily. May reuse the silicone foam for up to 3 days. Change PRN soiling   Increase activity slowly   Complete by: As directed       Allergies as of 04/09/2022       Reactions   Betadine [povidone Iodine] Swelling   Facial swelling   Fish Allergy Swelling   Facial swelling. Many years ago.   Iodine Swelling   Facial swelling   Shellfish Allergy Swelling   Facial swelling   Sulfamethoxazole-trimethoprim Swelling   Facial swelling   Gabapentin Other (See Comments)   Unknown reaction   Chlorhexidine    unknown   Trimethoprim Swelling   Facial swelling - Doesn't think he ever took this by itself.   Tramadol Other (See Comments)   Makes him crazy        Medication List     STOP taking these medications    aspirin 81 MG chewable tablet   digoxin 0.125 MG tablet Commonly known as: LANOXIN   folic acid 1 MG tablet Commonly known as: FOLVITE   lisinopril 2.5 MG tablet Commonly known as: ZESTRIL       TAKE these medications    acetaminophen 500 MG tablet Commonly known as: TYLENOL Take 1,000 mg by mouth every 6 (six) hours as needed for mild pain or headache.   amiodarone 200 MG tablet Commonly known as: PACERONE Take 1 tablet (200 mg total) by mouth 2 (two) times daily.   clopidogrel 75 MG tablet Commonly known as: PLAVIX Take 1 tablet (75 mg total) by mouth daily.   cyanocobalamin 1000 MCG tablet Take 1 tablet (1,000 mcg total) by mouth daily.   glipiZIDE 5 MG tablet Commonly known as: GLUCOTROL Take 1 tablet by mouth daily.   Insulin Glargine Solostar 100 UNIT/ML Solostar Pen Commonly known as: LANTUS Inject 8 Units into the skin at bedtime.   Linzess 290 MCG Caps capsule Generic drug: linaclotide Take 290 mcg by mouth daily as needed (IBS).   magnesium oxide 400 (240 Mg) MG  tablet Commonly known as: MAG-OX Take 1 tablet (400 mg total) by mouth daily.   metoprolol tartrate 25 MG tablet Commonly known as: LOPRESSOR Take 1 tablet (25 mg total) by mouth 2 (two) times daily.   nicotine 21 mg/24hr patch Commonly known as: NICODERM CQ - dosed in mg/24 hours Place 21 mg onto the skin daily.   nitroGLYCERIN 0.4 MG SL tablet Commonly known as: NITROSTAT Place 1 tablet (0.4 mg total) under the tongue every 5 (five) minutes x 3 doses as needed for chest pain.   QUEtiapine 100 MG tablet Commonly known as: SEROQUEL Take 1 tablet by mouth daily.   rosuvastatin 20 MG tablet Commonly known as: CRESTOR Take 1 tablet (20 mg total) by mouth daily. What changed:  medication strength how much to take   sertraline 25 MG tablet Commonly known as: ZOLOFT Take 3 tablets by mouth daily.   silver sulfADIAZINE 1 % cream Commonly known as: SILVADENE Apply 1 application  topically daily as needed (  For sores).   valproic acid 250 MG/5ML solution Commonly known as: DEPAKENE Take 10 mLs (500 mg total) by mouth 2 (two) times daily. What changed:  how much to take when to take this   warfarin 2.5 MG tablet Commonly known as: COUMADIN Take 1 tablet (2.5 mg total) by mouth daily at 6 (six) AM. What changed:  when to take this additional instructions Another medication with the same name was removed. Continue taking this medication, and follow the directions you see here.               Discharge Care Instructions  (From admission, onward)           Start     Ordered   04/09/22 0000  Discharge wound care:       Comments: Wound care to Stage 3 pressure injury to coccyx: Cleanse with soap and water, rinse and dry. Cover lesion with a size appropriate piece of silver hydrofiber Kellie Simmering # [id number]) and top with a dry gauze 2x2. Cover with a silicone bordered foam for the sacrum placed so that the "tip" points away form the anus. Change silver daily. May reuse the  silicone foam for up to 3 days. Change PRN soiling   04/09/22 0747            Follow-up Information     Imogene Burn, PA-C Follow up.   Specialty: Cardiology Why: [facility] - NOTE THIS IS THE [place] - a followup appointment has been scheduled for you on Wednesday Apr 13, 2022 at 1:45 PM (Arrive by 1:30 PM). Contact information: [address] [place] [address] [phone]                Allergies  Allergen Reactions   Betadine [Povidone Iodine] Swelling    Facial swelling   Fish Allergy Swelling    Facial swelling. Many years ago.   Iodine Swelling    Facial swelling   Shellfish Allergy Swelling    Facial swelling   Sulfamethoxazole-Trimethoprim Swelling    Facial swelling   Gabapentin Other (See Comments)    Unknown reaction   Chlorhexidine     unknown   Trimethoprim Swelling    Facial swelling - Doesn't think he ever took this by itself.   Tramadol Other (See Comments)    Makes him crazy    Consultations: Cardiology Palliative care   Procedures/Studies: DG Chest 2 View  Result Date: 04/05/2022 CLINICAL DATA:  Shortness of breath.  History of pleural effusion. EXAM: CHEST - 2 VIEW COMPARISON:  04/02/2022 FINDINGS: Stable cardiac enlargement. There is a moderate right pleural effusion with near complete atelectasis of the right lower lobe. Small left pleural effusion identified. No interstitial edema. IMPRESSION: 1. Moderate right pleural effusion and small left pleural effusion. 2. Right lower lobe atelectasis. Electronically Signed   By: Kerby Moors M.D.   On: 04/05/2022 11:24   ECHOCARDIOGRAM COMPLETE  Result Date: 04/04/2022    ECHOCARDIOGRAM REPORT   Patient Name:   KIJANA CROMIE Date of Exam: 04/04/2022 Medical Rec #:  [id number]       Height:       73.0 in Accession #:    [id number]      Weight:       189.4 lb Date of Birth:  September 12, 1946       BSA:          2.102 m Patient Age:    76 years  BP:            127/65 mmHg Patient Gender: M               HR:           104 bpm. Exam Location:  Inpatient Procedure: 2D Echo, Cardiac Doppler and Color Doppler Indications:    Chest pain  History:        Patient has prior history of Echocardiogram examinations, most                 recent 11/14/2017. CAD, PAD, Arrythmias:Atrial Fibrillation; Risk                 Factors:Hypertension and HLD. Dementia, Tobacco dependence.  Sonographer:    Joette Catching RCS Referring Phys: [place]  1. Left ventricular ejection fraction, by estimation, is 50 to 55%. The left ventricle has low normal function. The left ventricle demonstrates regional wall motion abnormalities (see scoring diagram/findings for description). Left ventricular diastolic  function could not be evaluated. There is mild hypokinesis of the left ventricular, entire anteroseptal wall and anterior wall.  2. Right ventricular systolic function is normal. The right ventricular size is normal. There is normal pulmonary artery systolic pressure.  3. A small pericardial effusion is present.  4. The mitral valve is grossly normal. Trivial mitral valve regurgitation. No evidence of mitral stenosis.  5. The aortic valve is grossly normal. Aortic valve regurgitation is not visualized. No aortic stenosis is present.  6. The inferior vena cava is normal in size with <50% respiratory variability, suggesting right atrial pressure of 8 mmHg. Comparison(s): Changes from prior study are noted. Conclusion(s)/Recommendation(s): Technically challenging study, as best images are subcostal. Overall low normal EF. Difficult to assess regional wall motion, but there is mild hypokinesis in the visualized portions of the anteroseptal and anterior wall.  In atrial fibrillation with RVR throughout study. FINDINGS  Left Ventricle: Left ventricular ejection fraction, by estimation, is 50 to 55%. The left ventricle has low normal function. The left ventricle demonstrates  regional wall motion abnormalities. Mild hypokinesis of the left ventricular, entire anteroseptal wall and anterior wall. The left ventricular internal cavity size was normal in size. There is no left ventricular hypertrophy. Left ventricular diastolic function could not be evaluated due to atrial fibrillation. Left ventricular diastolic  function could not be evaluated. Right Ventricle: The right ventricular size is normal. Right vetricular wall thickness was not well visualized. Right ventricular systolic function is normal. There is normal pulmonary artery systolic pressure. The tricuspid regurgitant velocity is 2.14 m/s, and with an assumed right atrial pressure of 8 mmHg, the estimated right ventricular systolic pressure is 05.3 mmHg. Left Atrium: Left atrial size was not well visualized. Right Atrium: Right atrial size was not well visualized. Pericardium: A small pericardial effusion is present. Mitral Valve: The mitral valve is grossly normal. There is mild thickening of the mitral valve leaflet(s). There is mild calcification of the mitral valve leaflet(s). Trivial mitral valve regurgitation. No evidence of mitral valve stenosis. Tricuspid Valve: The tricuspid valve is normal in structure. Tricuspid valve regurgitation is trivial. No evidence of tricuspid stenosis. Aortic Valve: The aortic valve is grossly normal. Aortic valve regurgitation is not visualized. No aortic stenosis is present. Aortic valve mean gradient measures 4.0 mmHg. Aortic valve peak gradient measures 7.2 mmHg. Aortic valve area, by VTI measures 1.85 cm. Pulmonic Valve: The pulmonic valve was not well visualized. Pulmonic valve regurgitation is not visualized. No evidence  of pulmonic stenosis. Aorta: The aortic root, ascending aorta, aortic arch and descending aorta are all structurally normal, with no evidence of dilitation or obstruction. Venous: The inferior vena cava is normal in size with less than 50% respiratory variability,  suggesting right atrial pressure of 8 mmHg. IAS/Shunts: The interatrial septum was not well visualized.  LEFT VENTRICLE PLAX 2D LVIDd:         4.40 cm   Diastology LVIDs:         3.70 cm   LV e' medial:    7.30 cm/s LV PW:         1.00 cm   LV E/e' medial:  16.0 LV IVS:        1.15 cm   LV e' lateral:   9.81 cm/s LVOT diam:     1.90 cm   LV E/e' lateral: 11.9 LV SV:         40 LV SV Index:   19 LVOT Area:     2.84 cm  RIGHT VENTRICLE             IVC RV Basal diam:  3.10 cm     IVC diam: 2.00 cm RV Mid diam:    2.70 cm RV S prime:     12.90 cm/s TAPSE (M-mode): 1.9 cm LEFT ATRIUM             Index        RIGHT ATRIUM           Index LA diam:        4.20 cm 2.00 cm/m   RA Area:     11.60 cm LA Vol (A2C):   43.8 ml 20.83 ml/m  RA Volume:   22.30 ml  10.61 ml/m LA Vol (A4C):   33.5 ml 15.93 ml/m LA Biplane Vol: 38.9 ml 18.50 ml/m  AORTIC VALVE                    PULMONIC VALVE AV Area (Vmax):    1.98 cm     PV Vmax:       0.98 m/s AV Area (Vmean):   1.93 cm     PV Peak grad:  3.9 mmHg AV Area (VTI):     1.85 cm AV Vmax:           134.00 cm/s AV Vmean:          90.500 cm/s AV VTI:            0.218 m AV Peak Grad:      7.2 mmHg AV Mean Grad:      4.0 mmHg LVOT Vmax:         93.50 cm/s LVOT Vmean:        61.600 cm/s LVOT VTI:          0.142 m LVOT/AV VTI ratio: 0.65  AORTA Ao Root diam: 2.80 cm MITRAL VALVE                TRICUSPID VALVE MV Area (PHT): 3.65 cm     TR Peak grad:   18.3 mmHg MV Decel Time: 208 msec     TR Vmax:        214.00 cm/s MV E velocity: 117.00 cm/s MV A velocity: 37.50 cm/s   SHUNTS MV E/A ratio:  3.12         Systemic VTI:  0.14 m  Systemic Diam: 1.90 cm Buford Dresser MD Electronically signed by Buford Dresser MD Signature Date/Time: 04/04/2022/11:53:33 AM    Final    DG CHEST PORT 1 VIEW  Result Date: 04/02/2022 CLINICAL DATA:  Chest pain of uncertain etiology EXAM: PORTABLE CHEST 1 VIEW COMPARISON:  06/07/2021 FINDINGS: Single frontal view of  the chest demonstrates an enlarged cardiac silhouette. There is congestion of the central pulmonary vasculature, with bilateral perihilar airspace disease. Additionally, there are bilateral pleural effusions, right much larger than left. The right pleural effusion may be partially loculated. No pneumothorax. IMPRESSION: 1. Constellation of findings most consistent with congestive heart failure, though underlying infection would be difficult to exclude. Radiographic follow-up after appropriate medical management is recommended. Electronically Signed   By: Randa Ngo M.D.   On: 04/02/2022 17:19   (Echo, Carotid, EGD, Colonoscopy, ERCP)    Subjective: Patient was seen and examined.  Sitting in chair.  Slightly impulsive.  Denies any complaints.  Eager to go home.  I told him he is going to rehab, he tells me it does not matter he is going to get out of the hospital.  No other overnight events.   Discharge Exam: Vitals:   04/09/22 0450 04/09/22 0859  BP: 134/72 133/61  Pulse: 80 80  Resp: 19 16  Temp: 98.8 F (37.1 C) 98.6 F (37 C)  SpO2: 96% 96%   Vitals:   04/08/22 2215 04/09/22 0450 04/09/22 0500 04/09/22 0859  BP: (!) 145/77 134/72  133/61  Pulse:  80  80  Resp:  19  16  Temp:  98.8 F (37.1 C)  98.6 F (37 C)  TempSrc:  Oral  Oral  SpO2:  96%  96%  Weight:   78.8 kg   Height:       General: Chronically sick looking.  Sitting in chair and eating breakfast.  On room air.  Not in any distress. Alert and awake but confused.  Impulsive.  Pressured speech. Cardiovascular: S1-S2 normal.  Irregularly rate and rhythm. Respiratory: No added sounds. Gastrointestinal: Soft.  Nontender.  Bowel sound present. Ext: No edema or cyanosis.     The results of significant diagnostics from this hospitalization (including imaging, microbiology, ancillary and laboratory) are listed below for reference.     Microbiology: No results found for this or any previous visit (from the past 240  hour(s)).   Labs: BNP (last 3 results) Recent Labs    04/02/22 1656  BNP 1,448.1*   Basic Metabolic Panel: Recent Labs  Lab 04/02/22 2217 04/03/22 0326 04/05/22 0703 04/06/22 0315 04/07/22 0213 04/08/22 0249 04/09/22 0219  NA 136   < > 141 139 141 140 139  K 6.0*   < > 3.2* 3.7 3.6 3.5 3.4*  CL 103   < > 100 102 104 100 103  CO2 20*   < > '25 26 23 28 26  '$ GLUCOSE 256*   < > 107* 111* 127* 83 100*  BUN 55*   < > 46* 45* 35* 35* 29*  CREATININE 2.14*   < > 2.08* 2.30* 2.04* 2.04* 2.01*  CALCIUM 8.3*   < > 8.2* 8.2* 8.6* 8.6* 8.3*  MG 2.1  --  2.0 2.1 1.9 2.3 2.2  PHOS 4.1  --   --   --   --   --   --    < > = values in this interval not displayed.   Liver Function Tests: Recent Labs  Lab 04/02/22 1656  AST 26  ALT  29  ALKPHOS 78  BILITOT 0.7  PROT 6.8  ALBUMIN 2.9*   No results for input(s): "LIPASE", "AMYLASE" in the last 168 hours. No results for input(s): "AMMONIA" in the last 168 hours. CBC: Recent Labs  Lab 04/05/22 0703 04/06/22 0315 04/07/22 0213 04/08/22 0249 04/09/22 0219  WBC 8.5 7.9 6.7 7.7 8.4  HGB 9.6* 9.7* 9.6* 9.2* 10.3*  HCT 27.9* 28.8* 28.4* 28.1* 30.3*  MCV 88.9 88.6 88.2 90.1 89.1  PLT 260 281 285 247 284   Cardiac Enzymes: No results for input(s): "CKTOTAL", "CKMB", "CKMBINDEX", "TROPONINI" in the last 168 hours. BNP: Invalid input(s): "POCBNP" CBG: Recent Labs  Lab 04/08/22 0821 04/08/22 1216 04/08/22 1622 04/08/22 2254 04/09/22 0803  GLUCAP 86 116* 202* 85 85   D-Dimer No results for input(s): "DDIMER" in the last 72 hours. Hgb A1c No results for input(s): "HGBA1C" in the last 72 hours. Lipid Profile No results for input(s): "CHOL", "HDL", "LDLCALC", "TRIG", "CHOLHDL", "LDLDIRECT" in the last 72 hours. Thyroid function studies No results for input(s): "TSH", "T4TOTAL", "T3FREE", "THYROIDAB" in the last 72 hours.  Invalid input(s): "FREET3" Anemia work up No results for input(s): "VITAMINB12", "FOLATE", "FERRITIN",  "TIBC", "IRON", "RETICCTPCT" in the last 72 hours. Urinalysis    Component Value Date/Time   COLORURINE YELLOW 04/03/2022 0438   APPEARANCEUR HAZY (A) 04/03/2022 0438   LABSPEC 1.024 04/03/2022 0438   PHURINE 5.0 04/03/2022 0438   GLUCOSEU NEGATIVE 04/03/2022 0438   HGBUR LARGE (A) 04/03/2022 0438   BILIRUBINUR NEGATIVE 04/03/2022 0438   KETONESUR NEGATIVE 04/03/2022 0438   PROTEINUR 30 (A) 04/03/2022 0438   NITRITE NEGATIVE 04/03/2022 0438   LEUKOCYTESUR NEGATIVE 04/03/2022 0438   Sepsis Labs Recent Labs  Lab 04/06/22 0315 04/07/22 0213 04/08/22 0249 04/09/22 0219  WBC 7.9 6.7 7.7 8.4   Microbiology No results found for this or any previous visit (from the past 240 hour(s)).   Time coordinating discharge: 35 minutes  SIGNED:   Barb Merino, MD  [facility] 04/09/2022, 10:20 AM

## 2022-04-09 NOTE — TOC Transition Note (Signed)
Transition of Care [facility]) - CM/SW Discharge Note   Patient Details  Name: Dennis [name] MRN: [id number] Date of Birth: 1946-04-10  [facility] CM/SW Contact:  Bary Castilla, LCSW Phone Number: 04/09/2022, 10:44 AM   Clinical Narrative:     Patient will DC to:?[facility] Anticipated DC date:?04/09/2022 Family notified:?Barbara Transport by: Corey Harold   Per MD patient ready for DC to [facility].RN, patient, patient's family, and facility notified of DC. Discharge Summary sent faxed to facility. RN given number for report [phone] ask for unit 2. DC packet on chart. Ambulance transport requested for patient.   CSW signing off.   [name], [place] [phone]   Final next level of care: Skilled Nursing Facility Barriers to Discharge: Barriers Resolved   Patient Goals and CMS Choice        Discharge Placement              Patient [facility] bed at:  [facility]) Patient to be transferred to facility by: [place] Name of family member notified: [name] Patient and family notified of of transfer: 04/09/22  Discharge Plan and Services   Discharge Planning Services: CM Consult Post Acute Care Choice: [place] Arranged: RN, Disease Management, PT, OT, Nurse's Aide [place] Agency: Other - See comment ([place]) Date [facility] Agency Contacted: 04/04/22 Time HH Agency Contacted: 1300 Representative spoke with at [place]: [place] ([place]) Interventions     Readmission Risk Interventions    04/04/2022   12:57 PM  Readmission Risk Prevention Plan  Transportation Screening Complete  PCP or Specialist Appt within 3-5 Days Complete  HRI or [place] Complete  Social Work Consult for [place] Planning/Counseling Complete  Palliative Care Screening Not Applicable  Medication Review Press photographer) Referral to Pharmacy

## 2022-04-09 NOTE — Progress Notes (Signed)
ANTICOAGULATION CONSULT NOTE  Pharmacy Consult for warfarin  Indication: atrial fibrillation  Allergies  Allergen Reactions   Betadine [Povidone Iodine] Swelling    Facial swelling   Fish Allergy Swelling    Facial swelling. Many years ago.   Iodine Swelling    Facial swelling   Shellfish Allergy Swelling    Facial swelling   Sulfamethoxazole-Trimethoprim Swelling    Facial swelling   Gabapentin Other (See Comments)    Unknown reaction   Chlorhexidine     unknown   Trimethoprim Swelling    Facial swelling - Doesn't think he ever took this by itself.   Tramadol Other (See Comments)    Makes him crazy    Labs: Recent Labs    04/07/22 0213 04/08/22 0249 04/09/22 0219  HGB 9.6* 9.2* 10.3*  HCT 28.4* 28.1* 30.3*  PLT 285 247 284  LABPROT 27.7* 31.3* 30.0*  INR 2.6* 3.1* 2.9*  CREATININE 2.04* 2.04* 2.01*     Estimated Creatinine Clearance: 35.9 mL/min (A) (by C-G formula based on SCr of 2.01 mg/dL (H)).      Assessment: 76 y/o male presented with chest pain to OSH [facility]) and transferred to [facility]. Pt on warfarin PTA for AFib which was held on admit for possible cath, now planning med management. Warfarin resumed, heparin bridge continued with low INR.  INR today is 2.9, CBC stable. With new amiodarone will need to reduce warfarin dose - recommend 2.'5mg'$  daily at discharge.  Home warfarin '5mg'$  MWF, 2.'5mg'$  AODs.   Goal of Therapy:  INR 2-3 Monitor platelets by anticoagulation protocol: Yes   Plan:  Warfarin 2.5 mg po daily Daily INR, CBC, s/sx of bleeding   Thank you Anette Guarneri, PharmD Please check AMION for all [facility] Pharmacy numbers 04/09/2022

## 2022-04-10 DIAGNOSIS — E119 Type 2 diabetes mellitus without complications: Secondary | ICD-10-CM | POA: Diagnosis not present

## 2022-04-10 DIAGNOSIS — I739 Peripheral vascular disease, unspecified: Secondary | ICD-10-CM | POA: Diagnosis not present

## 2022-04-10 DIAGNOSIS — F32A Depression, unspecified: Secondary | ICD-10-CM | POA: Diagnosis not present

## 2022-04-10 DIAGNOSIS — R4182 Altered mental status, unspecified: Secondary | ICD-10-CM | POA: Diagnosis not present

## 2022-04-10 DIAGNOSIS — L89153 Pressure ulcer of sacral region, stage 3: Secondary | ICD-10-CM | POA: Diagnosis not present

## 2022-04-10 DIAGNOSIS — Z743 Need for continuous supervision: Secondary | ICD-10-CM | POA: Diagnosis not present

## 2022-04-10 DIAGNOSIS — I4891 Unspecified atrial fibrillation: Secondary | ICD-10-CM | POA: Diagnosis not present

## 2022-04-10 DIAGNOSIS — R293 Abnormal posture: Secondary | ICD-10-CM | POA: Diagnosis not present

## 2022-04-10 DIAGNOSIS — K589 Irritable bowel syndrome without diarrhea: Secondary | ICD-10-CM | POA: Diagnosis not present

## 2022-04-10 DIAGNOSIS — I480 Paroxysmal atrial fibrillation: Secondary | ICD-10-CM | POA: Diagnosis not present

## 2022-04-10 DIAGNOSIS — F0390 Unspecified dementia without behavioral disturbance: Secondary | ICD-10-CM | POA: Diagnosis not present

## 2022-04-10 DIAGNOSIS — M6281 Muscle weakness (generalized): Secondary | ICD-10-CM | POA: Diagnosis not present

## 2022-04-10 DIAGNOSIS — F03918 Unspecified dementia, unspecified severity, with other behavioral disturbance: Secondary | ICD-10-CM | POA: Diagnosis not present

## 2022-04-10 DIAGNOSIS — Z7401 Bed confinement status: Secondary | ICD-10-CM | POA: Diagnosis not present

## 2022-04-10 DIAGNOSIS — I249 Acute ischemic heart disease, unspecified: Secondary | ICD-10-CM | POA: Diagnosis not present

## 2022-04-10 DIAGNOSIS — E785 Hyperlipidemia, unspecified: Secondary | ICD-10-CM | POA: Diagnosis not present

## 2022-04-10 DIAGNOSIS — Z9861 Coronary angioplasty status: Secondary | ICD-10-CM | POA: Diagnosis not present

## 2022-04-10 DIAGNOSIS — I2510 Atherosclerotic heart disease of native coronary artery without angina pectoris: Secondary | ICD-10-CM | POA: Diagnosis not present

## 2022-04-10 DIAGNOSIS — F39 Unspecified mood [affective] disorder: Secondary | ICD-10-CM | POA: Diagnosis not present

## 2022-04-10 DIAGNOSIS — I214 Non-ST elevation (NSTEMI) myocardial infarction: Secondary | ICD-10-CM | POA: Diagnosis not present

## 2022-04-10 DIAGNOSIS — I5030 Unspecified diastolic (congestive) heart failure: Secondary | ICD-10-CM | POA: Diagnosis not present

## 2022-04-10 DIAGNOSIS — Z9181 History of falling: Secondary | ICD-10-CM | POA: Diagnosis not present

## 2022-04-10 LAB — PROTIME-INR
INR: 3.3 — ABNORMAL HIGH (ref 0.8–1.2)
Prothrombin Time: 33.3 seconds — ABNORMAL HIGH (ref 11.4–15.2)

## 2022-04-10 LAB — CBC
HCT: 31.3 % — ABNORMAL LOW (ref 39.0–52.0)
Hemoglobin: 10.5 g/dL — ABNORMAL LOW (ref 13.0–17.0)
MCH: 30.2 pg (ref 26.0–34.0)
MCHC: 33.5 g/dL (ref 30.0–36.0)
MCV: 89.9 fL (ref 80.0–100.0)
Platelets: 266 10*3/uL (ref 150–400)
RBC: 3.48 MIL/uL — ABNORMAL LOW (ref 4.22–5.81)
RDW: 13.1 % (ref 11.5–15.5)
WBC: 7.6 10*3/uL (ref 4.0–10.5)
nRBC: 0 % (ref 0.0–0.2)

## 2022-04-10 LAB — GLUCOSE, CAPILLARY
Glucose-Capillary: 169 mg/dL — ABNORMAL HIGH (ref 70–99)
Glucose-Capillary: 124 mg/dL — ABNORMAL HIGH (ref 70–99)

## 2022-04-10 LAB — MAGNESIUM: Magnesium: 2.3 mg/dL (ref 1.7–2.4)

## 2022-04-10 MED ORDER — WARFARIN SODIUM 2.5 MG PO TABS: 2.5000 mg | ORAL_TABLET | Freq: Every day | ORAL | Status: DC

## 2022-04-10 NOTE — Progress Notes (Signed)
Patient seen and examined.  No overnight events.  Blood sugars improved.  He is more pleasant and interactive today.  He knows he is going to a SNF.  Surprisingly he is more calm and interactive.  Plan: Is stable for discharge today.  No charge visit.

## 2022-04-10 NOTE — Progress Notes (Signed)
Report called to Education administrator at [facility].  Patient notified of transfer.  Awaiting PTAR for transfer to [facility].

## 2022-04-10 NOTE — TOC Progression Note (Signed)
Transition of Care [facility]) - Progression Note    Patient Details  Name: Dennis [name] MRN: [id number] Date of Birth: 1946/01/06  Transition of Care [facility]) CM/SW [place], LCSW Phone Number: 04/10/2022, 9:47 AM  Clinical Narrative:     Patient will DC to:?[facility] Anticipated DC date:?04/10/2022 Family notified:?Barbara Transport by: Corey Harold   Per MD patient ready for DC today to [facility].RN, patient, patient's family, and facility notified of DC. Discharge Summary sent faxed to facility. RN given number for report [phone] ask for unit 2. DC packet on chart. Ambulance transport requested for patient.    CSW signing off.   [name],  [phone]   Expected Discharge Plan: Skilled Nursing Facility Barriers to Discharge: Barriers Resolved  Expected Discharge Plan and Services Expected Discharge Plan: [place]   Discharge Planning Services: CM Consult Post Acute Care Choice: Young Living arrangements for the past 2 months: Single Family Home Expected Discharge Date: 04/10/22                         HH Arranged: RN, Disease Management, PT, OT, Nurse's Aide HH Agency: Other - See comment ([place]) Date [place]: 04/04/22 Time HH Agency Contacted: 1300 Representative spoke with at [place]: [place] Determinants of Health ([place]) Interventions    Readmission Risk Interventions    04/04/2022   12:57 PM  Readmission Risk Prevention Plan  Transportation Screening Complete  PCP or Specialist Appt within 3-5 Days Complete  HRI or [place] Complete  Social Work Consult for [place] Planning/Counseling Complete  Palliative Care Screening Not Applicable  Medication Review Press photographer) Referral to Pharmacy

## 2022-04-10 NOTE — Discharge Summary (Signed)
Physician Discharge Summary  Dennis [name] ERX:[id number] DOB: 1946-09-06 DOA: 04/02/2022  PCP: Glenda Chroman, MD  Admit date: 04/02/2022 Discharge date: 04/10/2022  Admitted From: Home Disposition: Skilled nursing facility  Recommendations for Outpatient Follow-up:  Follow up with PCP in 1-2 weeks Please obtain BMP/CBC in one week Consult palliative care team on arrival to a skilled nursing facility All time fall precautions.  Start coumadin tomorrow 7/24  Home Health: N/A Equipment/Devices: N/A  Discharge Condition: Fair CODE STATUS: DNR/DNI Diet recommendation: Low-salt diet  Discharge summary: Dennis [name] is a 76 y.o. 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  He reported chest pain started 2 days ago.  Patient was found to have elevated troponin, concern for NSTEMI, he was admitted for further work-up as transfer from [facility].  Remained confused.  Followed by cardiology.  Medical treatment decided.  Also followed by palliative care team.  Able to go to a skilled nursing facility today.     Assessment and plan of care:   NSTEMI with known history of CAD, hyperlipidemia -Troponin peaked at 1.635 -Seen by cardiology.  Poor candidate for cardiac cath given his dementia, intermittent agitation and chronic kidney disease overall poor functional status. -Continue with conservative management, started on Plavix.  Was on heparin and now therapeutic on Coumadin. -On metoprolol -He is on statin, rosuvastatin  titrated from 5 to 20 mg.   HTN -Blood pressure stable now.   HLD -LDL 72, rosuvastatin titrated from 5 to 20 mg.  Is stable.   DM -A1C: 5.4-- improved from prior -Resume glipizide.  Monitor for hypoglycemic symptoms.  Keep on sliding scale insulin at the nursing home.   A-fib with  RVR -Heart rate was uncontrolled and started on amiodarone drip. -Digoxin discontinued.  Now on oral amiodarone. -On warfarin for anticoagulation and therapeutic.   Dementia/Mood d/o, failure to thrive, goal of care: -Persistently impulsive.  Intermittently confused. -High risk for hospital delirium, with significant confusion overlying. -He does have QT prolongation, however benefits outweigh risk.   -Now back on Seroquel, Depakote and Zoloft.  Behavior is controlled. -Goal of care discussion held with palliative care team.  SNF placement.  DNR and comfort care measures if further worsens. -Consult palliative care team at the skilled nursing rehab.   PVD -s/p R AKA, well healed   Stage 3a CKD -At about baseline. -Hold lisinopril -Monitor.   Sacral pressure ulcer -stage 2 sacral pressure ulcer, present on admission -Local wound care.   Remains in poor functional status, however stable today.  Plan is to attempt to rehab and ongoing follow-up by palliative care team at the skilled nursing facility.  If further worsening, he will benefit with hospice level of care.  If he gets better at rehab, he may need long-term care at assisted living level of care.   Discharge Diagnoses:  Principal Problem:   ACS (acute coronary syndrome) ([place]) Active Problems:   Tobacco abuse   Hyperlipidemia   Hypertension   PAF (paroxysmal atrial fibrillation) (HCC)   PAD (peripheral artery disease) (HCC)   Type 2 diabetes mellitus with complication, with long-term current use of insulin (HCC)   Dementia with behavioral disturbance (HCC)   Chronic kidney disease, stage 3a [facility])    Discharge Instructions  Discharge Instructions     Diet - low sodium heart healthy  Complete by: As directed    Diet Carb Modified   Complete by: As directed    Discharge wound care:   Complete by: As directed    Wound care to Stage 3 pressure injury to coccyx: Cleanse with soap and water, rinse and dry. Cover  lesion with a size appropriate piece of silver hydrofiber Kellie Simmering # [phone]) and top with a dry gauze 2x2. Cover with a silicone bordered foam for the sacrum placed so that the "tip" points away form the anus. Change silver daily. May reuse the silicone foam for up to 3 days. Change PRN soiling   Increase activity slowly   Complete by: As directed       Allergies as of 04/10/2022       Reactions   Betadine [povidone Iodine] Swelling   Facial swelling   Fish Allergy Swelling   Facial swelling. Many years ago.   Iodine Swelling   Facial swelling   Shellfish Allergy Swelling   Facial swelling   Sulfamethoxazole-trimethoprim Swelling   Facial swelling   Gabapentin Other (See Comments)   Unknown reaction   Chlorhexidine    unknown   Trimethoprim Swelling   Facial swelling - Doesn't think he ever took this by itself.   Tramadol Other (See Comments)   Makes him crazy        Medication List     STOP taking these medications    aspirin 81 MG chewable tablet   digoxin 0.125 MG tablet Commonly known as: LANOXIN   folic acid 1 MG tablet Commonly known as: FOLVITE   lisinopril 2.5 MG tablet Commonly known as: ZESTRIL       TAKE these medications    acetaminophen 500 MG tablet Commonly known as: TYLENOL Take 1,000 mg by mouth every 6 (six) hours as needed for mild pain or headache.   amiodarone 200 MG tablet Commonly known as: PACERONE Take 1 tablet (200 mg total) by mouth 2 (two) times daily.   clopidogrel 75 MG tablet Commonly known as: PLAVIX Take 1 tablet (75 mg total) by mouth daily.   cyanocobalamin 1000 MCG tablet Take 1 tablet (1,000 mcg total) by mouth daily.   glipiZIDE 5 MG tablet Commonly known as: GLUCOTROL Take 1 tablet by mouth daily.   Insulin Glargine Solostar 100 UNIT/ML Solostar Pen Commonly known as: LANTUS Inject 8 Units into the skin at bedtime.   Linzess 290 MCG Caps capsule Generic drug: linaclotide Take 290 mcg by mouth daily as  needed (IBS).   magnesium oxide 400 (240 Mg) MG tablet Commonly known as: MAG-OX Take 1 tablet (400 mg total) by mouth daily.   metoprolol tartrate 25 MG tablet Commonly known as: LOPRESSOR Take 1 tablet (25 mg total) by mouth 2 (two) times daily.   nicotine 21 mg/24hr patch Commonly known as: NICODERM CQ - dosed in mg/24 hours Place 21 mg onto the skin daily.   nitroGLYCERIN 0.4 MG SL tablet Commonly known as: NITROSTAT Place 1 tablet (0.4 mg total) under the tongue every 5 (five) minutes x 3 doses as needed for chest pain.   QUEtiapine 100 MG tablet Commonly known as: SEROQUEL Take 1 tablet by mouth daily.   rosuvastatin 20 MG tablet Commonly known as: CRESTOR Take 1 tablet (20 mg total) by mouth daily. What changed:  medication strength how much to take   sertraline 25 MG tablet Commonly known as: ZOLOFT Take 3 tablets by mouth daily.   silver sulfADIAZINE 1 % cream Commonly known as: SILVADENE  Apply 1 application  topically daily as needed (For sores).   valproic acid 250 MG/5ML solution Commonly known as: DEPAKENE Take 10 mLs (500 mg total) by mouth 2 (two) times daily. What changed:  how much to take when to take this   warfarin 2.5 MG tablet Commonly known as: COUMADIN Take 1 tablet (2.5 mg total) by mouth daily at 6 (six) AM. Start taking on: April 11, 2022 What changed:  when to take this additional instructions Another medication with the same name was removed. Continue taking this medication, and follow the directions you see here.               Discharge Care Instructions  (From admission, onward)           Start     Ordered   04/09/22 0000  Discharge wound care:       Comments: Wound care to Stage 3 pressure injury to coccyx: Cleanse with soap and water, rinse and dry. Cover lesion with a size appropriate piece of silver hydrofiber Kellie Simmering # [phone]) and top with a dry gauze 2x2. Cover with a silicone bordered foam for the sacrum placed  so that the "tip" points away form the anus. Change silver daily. May reuse the silicone foam for up to 3 days. Change PRN soiling   04/09/22 0747            Follow-up Information     Imogene Burn, PA-C Follow up.   Specialty: Cardiology Why: [facility] - NOTE THIS IS THE [place] - a followup appointment has been scheduled for you on Wednesday Apr 13, 2022 at 1:45 PM (Arrive by 1:30 PM). Contact information: [address] [place] [address] [phone]                Allergies  Allergen Reactions   Betadine [Povidone Iodine] Swelling    Facial swelling   Fish Allergy Swelling    Facial swelling. Many years ago.   Iodine Swelling    Facial swelling   Shellfish Allergy Swelling    Facial swelling   Sulfamethoxazole-Trimethoprim Swelling    Facial swelling   Gabapentin Other (See Comments)    Unknown reaction   Chlorhexidine     unknown   Trimethoprim Swelling    Facial swelling - Doesn't think he ever took this by itself.   Tramadol Other (See Comments)    Makes him crazy    Consultations: Cardiology Palliative care   Procedures/Studies: CT HEAD WO CONTRAST (5MM)  Result Date: 04/09/2022 CLINICAL DATA:  Provided history: Head trauma, moderate/severe. Additional history provided: Fall. EXAM: CT HEAD WITHOUT CONTRAST TECHNIQUE: Contiguous axial images were obtained from the base of the skull through the vertex without intravenous contrast. RADIATION DOSE REDUCTION: This exam was performed according to the departmental dose-optimization program which includes automated exposure control, adjustment of the mA and/or kV according to patient size and/or use of iterative reconstruction technique. COMPARISON:  Head CT 01/25/2020. FINDINGS: Brain: Moderate generalized cerebral atrophy. Comparatively mild cerebellar atrophy. Mild patchy and ill-defined hypoattenuation within the cerebral white matter, nonspecific but compatible  chronic small vessel ischemic disease. 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 There is no acute intracranial hemorrhage. No demarcated cortical infarct. No extra-axial fluid collection. No evidence of an intracranial mass. No midline shift. Vascular: No hyperdense vessel.  Atherosclerotic calcifications. Skull: No fracture or aggressive osseous lesion. Sinuses/Orbits: No mass or acute finding within the imaged orbits. Chronic  left ocular lens dislocation. No significant paranasal sinus disease at the imaged levels. IMPRESSION: No evidence of acute intracranial abnormality. Mild chronic small vessel ischemic changes within the cerebral white matter. Redemonstrated chronic lacunar infarct within the right basal ganglia. Mild generalized cerebral atrophy. Comparatively mild cerebellar atrophy. Chronic left ocular lens dislocation. Electronically Signed   By: Kellie Simmering D.O.   On: 04/09/2022 14:21   DG Chest 2 View  Result Date: 04/05/2022 CLINICAL DATA:  Shortness of breath.  History of pleural effusion. EXAM: CHEST - 2 VIEW COMPARISON:  04/02/2022 FINDINGS: Stable cardiac enlargement. There is a moderate right pleural effusion with near complete atelectasis of the right lower lobe. Small left pleural effusion identified. No interstitial edema. IMPRESSION: 1. Moderate right pleural effusion and small left pleural effusion. 2. Right lower lobe atelectasis. Electronically Signed   By: Kerby Moors M.D.   On: 04/05/2022 11:24   ECHOCARDIOGRAM COMPLETE  Result Date: 04/04/2022    ECHOCARDIOGRAM REPORT   Patient Name:   Dennis [name] Date of Exam: 04/04/2022 Medical Rec #:  [id number]       Height:       73.0 in Accession #:    [id number]      Weight:       189.4 lb Date of Birth:  12-Jul-1946       BSA:          2.102 m Patient Age:    72 years        BP:           127/65 mmHg Patient Gender: M               HR:           104 bpm. Exam Location:  Inpatient Procedure: 2D Echo,  Cardiac Doppler and Color Doppler Indications:    Chest pain  History:        Patient has prior history of Echocardiogram examinations, most                 recent 11/14/2017. CAD, PAD, Arrythmias:Atrial Fibrillation; Risk                 Factors:Hypertension and HLD. Dementia, Tobacco dependence.  Sonographer:    Joette Catching RCS Referring Phys: [place]  1. Left ventricular ejection fraction, by estimation, is 50 to 55%. The left ventricle has low normal function. The left ventricle demonstrates regional wall motion abnormalities (see scoring diagram/findings for description). Left ventricular diastolic  function could not be evaluated. There is mild hypokinesis of the left ventricular, entire anteroseptal wall and anterior wall.  2. Right ventricular systolic function is normal. The right ventricular size is normal. There is normal pulmonary artery systolic pressure.  3. A small pericardial effusion is present.  4. The mitral valve is grossly normal. Trivial mitral valve regurgitation. No evidence of mitral stenosis.  5. The aortic valve is grossly normal. Aortic valve regurgitation is not visualized. No aortic stenosis is present.  6. The inferior vena cava is normal in size with <50% respiratory variability, suggesting right atrial pressure of 8 mmHg. Comparison(s): Changes from prior study are noted. Conclusion(s)/Recommendation(s): Technically challenging study, as best images are subcostal. Overall low normal EF. Difficult to assess regional wall motion, but there is mild hypokinesis in the visualized portions of the anteroseptal and anterior wall.  In atrial fibrillation with RVR throughout study. FINDINGS  Left Ventricle: Left ventricular ejection fraction, by estimation, is 50 to 55%. The  left ventricle has low normal function. The left ventricle demonstrates regional wall motion abnormalities. Mild hypokinesis of the left ventricular, entire anteroseptal wall and anterior wall.  The left ventricular internal cavity size was normal in size. There is no left ventricular hypertrophy. Left ventricular diastolic function could not be evaluated due to atrial fibrillation. Left ventricular diastolic  function could not be evaluated. Right Ventricle: The right ventricular size is normal. Right vetricular wall thickness was not well visualized. Right ventricular systolic function is normal. There is normal pulmonary artery systolic pressure. The tricuspid regurgitant velocity is 2.14 m/s, and with an assumed right atrial pressure of 8 mmHg, the estimated right ventricular systolic pressure is 09.3 mmHg. Left Atrium: Left atrial size was not well visualized. Right Atrium: Right atrial size was not well visualized. Pericardium: A small pericardial effusion is present. Mitral Valve: The mitral valve is grossly normal. There is mild thickening of the mitral valve leaflet(s). There is mild calcification of the mitral valve leaflet(s). Trivial mitral valve regurgitation. No evidence of mitral valve stenosis. Tricuspid Valve: The tricuspid valve is normal in structure. Tricuspid valve regurgitation is trivial. No evidence of tricuspid stenosis. Aortic Valve: The aortic valve is grossly normal. Aortic valve regurgitation is not visualized. No aortic stenosis is present. Aortic valve mean gradient measures 4.0 mmHg. Aortic valve peak gradient measures 7.2 mmHg. Aortic valve area, by VTI measures 1.85 cm. Pulmonic Valve: The pulmonic valve was not well visualized. Pulmonic valve regurgitation is not visualized. No evidence of pulmonic stenosis. Aorta: The aortic root, ascending aorta, aortic arch and descending aorta are all structurally normal, with no evidence of dilitation or obstruction. Venous: The inferior vena cava is normal in size with less than 50% respiratory variability, suggesting right atrial pressure of 8 mmHg. IAS/Shunts: The interatrial septum was not well visualized.  LEFT VENTRICLE PLAX 2D  LVIDd:         4.40 cm   Diastology LVIDs:         3.70 cm   LV e' medial:    7.30 cm/s LV PW:         1.00 cm   LV E/e' medial:  16.0 LV IVS:        1.15 cm   LV e' lateral:   9.81 cm/s LVOT diam:     1.90 cm   LV E/e' lateral: 11.9 LV SV:         40 LV SV Index:   19 LVOT Area:     2.84 cm  RIGHT VENTRICLE             IVC RV Basal diam:  3.10 cm     IVC diam: 2.00 cm RV Mid diam:    2.70 cm RV S prime:     12.90 cm/s TAPSE (M-mode): 1.9 cm LEFT ATRIUM             Index        RIGHT ATRIUM           Index LA diam:        4.20 cm 2.00 cm/m   RA Area:     11.60 cm LA Vol (A2C):   43.8 ml 20.83 ml/m  RA Volume:   22.30 ml  10.61 ml/m LA Vol (A4C):   33.5 ml 15.93 ml/m LA Biplane Vol: 38.9 ml 18.50 ml/m  AORTIC VALVE                    PULMONIC VALVE AV  Area (Vmax):    1.98 cm     PV Vmax:       0.98 m/s AV Area (Vmean):   1.93 cm     PV Peak grad:  3.9 mmHg AV Area (VTI):     1.85 cm AV Vmax:           134.00 cm/s AV Vmean:          90.500 cm/s AV VTI:            0.218 m AV Peak Grad:      7.2 mmHg AV Mean Grad:      4.0 mmHg LVOT Vmax:         93.50 cm/s LVOT Vmean:        61.600 cm/s LVOT VTI:          0.142 m LVOT/AV VTI ratio: 0.65  AORTA Ao Root diam: 2.80 cm MITRAL VALVE                TRICUSPID VALVE MV Area (PHT): 3.65 cm     TR Peak grad:   18.3 mmHg MV Decel Time: 208 msec     TR Vmax:        214.00 cm/s MV E velocity: 117.00 cm/s MV A velocity: 37.50 cm/s   SHUNTS MV E/A ratio:  3.12         Systemic VTI:  0.14 m                             Systemic Diam: 1.90 cm Buford Dresser MD Electronically signed by Buford Dresser MD Signature Date/Time: 04/04/2022/11:53:33 AM    Final    DG CHEST PORT 1 VIEW  Result Date: 04/02/2022 CLINICAL DATA:  Chest pain of uncertain etiology EXAM: PORTABLE CHEST 1 VIEW COMPARISON:  06/07/2021 FINDINGS: Single frontal view of the chest demonstrates an enlarged cardiac silhouette. There is congestion of the central pulmonary vasculature, with bilateral  perihilar airspace disease. Additionally, there are bilateral pleural effusions, right much larger than left. The right pleural effusion may be partially loculated. No pneumothorax. IMPRESSION: 1. Constellation of findings most consistent with congestive heart failure, though underlying infection would be difficult to exclude. Radiographic follow-up after appropriate medical management is recommended. Electronically Signed   By: Randa Ngo M.D.   On: 04/02/2022 17:19   (Echo, Carotid, EGD, Colonoscopy, ERCP)    Subjective: seen and examined . More pleasant and interactive today.  Received lispro but skipped meal causing low blood sugars but better today. He is agreable to go to SNF and aware.   Discharge Exam: Vitals:   04/10/22 0355 04/10/22 0823  BP:  (!) 106/58  Pulse: 62 68  Resp: 20 20  Temp: 97.9 F (36.6 C) 98.2 F (36.8 C)  SpO2: 98% 94%   Vitals:   04/09/22 2100 04/10/22 0011 04/10/22 0355 04/10/22 0823  BP: 102/70 (!) 111/51  (!) 106/58  Pulse: 68 63 62 68  Resp: '18 19 20 20  '$ Temp: 97.7 F (36.5 C) 98.2 F (36.8 C) 97.9 F (36.6 C) 98.2 F (36.8 C)  TempSrc: Oral Oral Oral Oral  SpO2: 97% 97% 98% 94%  Weight:   76.1 kg   Height:       General: Chronically sick looking.  eating breakfast.  On room air.  Not in any distress. Alert and awake but confused.  pleasant and calm and quiet today. Cardiovascular: S1-S2 normal.  Irregularly rate and  rhythm. Respiratory: No added sounds. Gastrointestinal: Soft.  Nontender.  Bowel sound present. Ext: No edema or cyanosis.     The results of significant diagnostics from this hospitalization (including imaging, microbiology, ancillary and laboratory) are listed below for reference.     Microbiology: No results found for this or any previous visit (from the past 240 hour(s)).   Labs: BNP (last 3 results) Recent Labs    04/02/22 1656  BNP 3,785.8*   Basic Metabolic Panel: Recent Labs  Lab 04/05/22 0703  04/06/22 0315 04/07/22 0213 04/08/22 0249 04/09/22 0219 04/10/22 0132  NA 141 139 141 140 139  --   K 3.2* 3.7 3.6 3.5 3.4*  --   CL 100 102 104 100 103  --   CO2 '25 26 23 28 26  '$ --   GLUCOSE 107* 111* 127* 83 100*  --   BUN 46* 45* 35* 35* 29*  --   CREATININE 2.08* 2.30* 2.04* 2.04* 2.01*  --   CALCIUM 8.2* 8.2* 8.6* 8.6* 8.3*  --   MG 2.0 2.1 1.9 2.3 2.2 2.3   Liver Function Tests: No results for input(s): "AST", "ALT", "ALKPHOS", "BILITOT", "PROT", "ALBUMIN" in the last 168 hours.  No results for input(s): "LIPASE", "AMYLASE" in the last 168 hours. No results for input(s): "AMMONIA" in the last 168 hours. CBC: Recent Labs  Lab 04/06/22 0315 04/07/22 0213 04/08/22 0249 04/09/22 0219 04/10/22 0132  WBC 7.9 6.7 7.7 8.4 7.6  HGB 9.7* 9.6* 9.2* 10.3* 10.5*  HCT 28.8* 28.4* 28.1* 30.3* 31.3*  MCV 88.6 88.2 90.1 89.1 89.9  PLT 281 285 247 284 266   Cardiac Enzymes: No results for input(s): "CKTOTAL", "CKMB", "CKMBINDEX", "TROPONINI" in the last 168 hours. BNP: Invalid input(s): "POCBNP" CBG: Recent Labs  Lab 04/09/22 1117 04/09/22 1655 04/09/22 1726 04/09/22 2135 04/10/22 0811  GLUCAP 161* 41* 131* 94 169*   D-Dimer No results for input(s): "DDIMER" in the last 72 hours. Hgb A1c No results for input(s): "HGBA1C" in the last 72 hours. Lipid Profile No results for input(s): "CHOL", "HDL", "LDLCALC", "TRIG", "CHOLHDL", "LDLDIRECT" in the last 72 hours. Thyroid function studies No results for input(s): "TSH", "T4TOTAL", "T3FREE", "THYROIDAB" in the last 72 hours.  Invalid input(s): "FREET3" Anemia work up No results for input(s): "VITAMINB12", "FOLATE", "FERRITIN", "TIBC", "IRON", "RETICCTPCT" in the last 72 hours. Urinalysis    Component Value Date/Time   COLORURINE YELLOW 04/03/2022 0438   APPEARANCEUR HAZY (A) 04/03/2022 0438   LABSPEC 1.024 04/03/2022 0438   PHURINE 5.0 04/03/2022 0438   GLUCOSEU NEGATIVE 04/03/2022 0438   HGBUR LARGE (A) 04/03/2022  0438   BILIRUBINUR NEGATIVE 04/03/2022 0438   KETONESUR NEGATIVE 04/03/2022 0438   PROTEINUR 30 (A) 04/03/2022 0438   NITRITE NEGATIVE 04/03/2022 0438   LEUKOCYTESUR NEGATIVE 04/03/2022 0438   Sepsis Labs Recent Labs  Lab 04/07/22 0213 04/08/22 0249 04/09/22 0219 04/10/22 0132  WBC 6.7 7.7 8.4 7.6   Microbiology No results found for this or any previous visit (from the past 240 hour(s)).   Time coordinating discharge: 35 minutes  SIGNED:   Barb Merino, MD  [facility] 04/10/2022, 9:35 AM

## 2022-04-10 NOTE — Progress Notes (Signed)
ANTICOAGULATION CONSULT NOTE  Pharmacy Consult for warfarin  Indication: atrial fibrillation  Allergies  Allergen Reactions   Betadine [Povidone Iodine] Swelling    Facial swelling   Fish Allergy Swelling    Facial swelling. Many years ago.   Iodine Swelling    Facial swelling   Shellfish Allergy Swelling    Facial swelling   Sulfamethoxazole-Trimethoprim Swelling    Facial swelling   Gabapentin Other (See Comments)    Unknown reaction   Chlorhexidine     unknown   Trimethoprim Swelling    Facial swelling - Doesn't think he ever took this by itself.   Tramadol Other (See Comments)    Makes him crazy    Labs: Recent Labs    04/08/22 0249 04/09/22 0219 04/10/22 0132  HGB 9.2* 10.3* 10.5*  HCT 28.1* 30.3* 31.3*  PLT 247 284 266  LABPROT 31.3* 30.0* 33.3*  INR 3.1* 2.9* 3.3*  CREATININE 2.04* 2.01*  --      Estimated Creatinine Clearance: 34.7 mL/min (A) (by C-G formula based on SCr of 2.01 mg/dL (H)).      Assessment: 76 y/o male presented with chest pain to OSH [facility]) and transferred to [facility]. Pt on warfarin PTA for AFib which was held on admit for possible cath, now planning med management. Warfarin resumed, heparin bridge continued with low INR.  INR today is 3.3, CBC stable. With new amiodarone will need to reduce warfarin dose - recommend 2.'5mg'$  daily at discharge.  Home warfarin '5mg'$  MWF, 2.'5mg'$  AODs.   Goal of Therapy:  INR 2-3 Monitor platelets by anticoagulation protocol: Yes   Plan:  Hold coumadin 7/23. Start with lower dose recommendation at home of 2.5 mg daily on 7/24 INR folllow-up within 3 days of discharge   Alicia Ackert BS, PharmD, BCPS Clinical Pharmacist 04/10/2022 8:48 AM  Contact: [phone] after 3 PM  "Be curious, not judgmental..." -Jamal Maes

## 2022-04-11 DIAGNOSIS — I2510 Atherosclerotic heart disease of native coronary artery without angina pectoris: Secondary | ICD-10-CM | POA: Diagnosis not present

## 2022-04-11 DIAGNOSIS — F32A Depression, unspecified: Secondary | ICD-10-CM | POA: Diagnosis not present

## 2022-04-11 DIAGNOSIS — I4891 Unspecified atrial fibrillation: Secondary | ICD-10-CM | POA: Diagnosis not present

## 2022-04-11 DIAGNOSIS — E119 Type 2 diabetes mellitus without complications: Secondary | ICD-10-CM | POA: Diagnosis not present

## 2022-04-11 DIAGNOSIS — K589 Irritable bowel syndrome without diarrhea: Secondary | ICD-10-CM | POA: Diagnosis not present

## 2022-04-11 DIAGNOSIS — I739 Peripheral vascular disease, unspecified: Secondary | ICD-10-CM | POA: Diagnosis not present

## 2022-04-11 DIAGNOSIS — F0390 Unspecified dementia without behavioral disturbance: Secondary | ICD-10-CM | POA: Diagnosis not present

## 2022-04-11 DIAGNOSIS — E785 Hyperlipidemia, unspecified: Secondary | ICD-10-CM | POA: Diagnosis not present

## 2022-04-12 DIAGNOSIS — E785 Hyperlipidemia, unspecified: Secondary | ICD-10-CM | POA: Diagnosis not present

## 2022-04-12 DIAGNOSIS — I2510 Atherosclerotic heart disease of native coronary artery without angina pectoris: Secondary | ICD-10-CM | POA: Diagnosis not present

## 2022-04-12 DIAGNOSIS — F0390 Unspecified dementia without behavioral disturbance: Secondary | ICD-10-CM | POA: Diagnosis not present

## 2022-04-12 DIAGNOSIS — I214 Non-ST elevation (NSTEMI) myocardial infarction: Secondary | ICD-10-CM | POA: Diagnosis not present

## 2022-04-12 DIAGNOSIS — F39 Unspecified mood [affective] disorder: Secondary | ICD-10-CM | POA: Diagnosis not present

## 2022-04-12 DIAGNOSIS — I739 Peripheral vascular disease, unspecified: Secondary | ICD-10-CM | POA: Diagnosis not present

## 2022-04-12 DIAGNOSIS — K589 Irritable bowel syndrome without diarrhea: Secondary | ICD-10-CM | POA: Diagnosis not present

## 2022-04-12 DIAGNOSIS — E119 Type 2 diabetes mellitus without complications: Secondary | ICD-10-CM | POA: Diagnosis not present

## 2022-04-12 DIAGNOSIS — I4891 Unspecified atrial fibrillation: Secondary | ICD-10-CM | POA: Diagnosis not present

## 2022-04-13 ENCOUNTER — Ambulatory Visit: Payer: Medicare HMO | Admitting: Physician Assistant

## 2022-04-13 DIAGNOSIS — E785 Hyperlipidemia, unspecified: Secondary | ICD-10-CM | POA: Diagnosis not present

## 2022-04-13 DIAGNOSIS — F03918 Unspecified dementia, unspecified severity, with other behavioral disturbance: Secondary | ICD-10-CM | POA: Diagnosis not present

## 2022-04-13 DIAGNOSIS — E119 Type 2 diabetes mellitus without complications: Secondary | ICD-10-CM | POA: Diagnosis not present

## 2022-04-13 DIAGNOSIS — L89153 Pressure ulcer of sacral region, stage 3: Secondary | ICD-10-CM | POA: Diagnosis not present

## 2022-04-15 DIAGNOSIS — I4891 Unspecified atrial fibrillation: Secondary | ICD-10-CM | POA: Diagnosis not present

## 2022-04-15 DIAGNOSIS — E119 Type 2 diabetes mellitus without complications: Secondary | ICD-10-CM | POA: Diagnosis not present

## 2022-04-15 DIAGNOSIS — K589 Irritable bowel syndrome without diarrhea: Secondary | ICD-10-CM | POA: Diagnosis not present

## 2022-04-15 DIAGNOSIS — I214 Non-ST elevation (NSTEMI) myocardial infarction: Secondary | ICD-10-CM | POA: Diagnosis not present

## 2022-04-15 DIAGNOSIS — F39 Unspecified mood [affective] disorder: Secondary | ICD-10-CM | POA: Diagnosis not present

## 2022-04-15 DIAGNOSIS — E785 Hyperlipidemia, unspecified: Secondary | ICD-10-CM | POA: Diagnosis not present

## 2022-04-15 DIAGNOSIS — F0390 Unspecified dementia without behavioral disturbance: Secondary | ICD-10-CM | POA: Diagnosis not present

## 2022-04-15 DIAGNOSIS — I739 Peripheral vascular disease, unspecified: Secondary | ICD-10-CM | POA: Diagnosis not present

## 2022-04-15 DIAGNOSIS — I2510 Atherosclerotic heart disease of native coronary artery without angina pectoris: Secondary | ICD-10-CM | POA: Diagnosis not present

## 2022-04-16 DIAGNOSIS — I4891 Unspecified atrial fibrillation: Secondary | ICD-10-CM | POA: Diagnosis not present

## 2022-04-16 DIAGNOSIS — I2510 Atherosclerotic heart disease of native coronary artery without angina pectoris: Secondary | ICD-10-CM | POA: Diagnosis not present

## 2022-04-16 DIAGNOSIS — F419 Anxiety disorder, unspecified: Secondary | ICD-10-CM | POA: Diagnosis not present

## 2022-04-16 DIAGNOSIS — Z4781 Encounter for orthopedic aftercare following surgical amputation: Secondary | ICD-10-CM | POA: Diagnosis not present

## 2022-04-16 DIAGNOSIS — I509 Heart failure, unspecified: Secondary | ICD-10-CM | POA: Diagnosis not present

## 2022-04-16 DIAGNOSIS — F32A Depression, unspecified: Secondary | ICD-10-CM | POA: Diagnosis not present

## 2022-04-16 DIAGNOSIS — I739 Peripheral vascular disease, unspecified: Secondary | ICD-10-CM | POA: Diagnosis not present

## 2022-04-16 DIAGNOSIS — E1143 Type 2 diabetes mellitus with diabetic autonomic (poly)neuropathy: Secondary | ICD-10-CM | POA: Diagnosis not present

## 2022-04-16 DIAGNOSIS — Z89611 Acquired absence of right leg above knee: Secondary | ICD-10-CM | POA: Diagnosis not present

## 2022-04-18 DIAGNOSIS — I739 Peripheral vascular disease, unspecified: Secondary | ICD-10-CM | POA: Diagnosis not present

## 2022-04-18 DIAGNOSIS — Z4781 Encounter for orthopedic aftercare following surgical amputation: Secondary | ICD-10-CM | POA: Diagnosis not present

## 2022-04-18 DIAGNOSIS — E1143 Type 2 diabetes mellitus with diabetic autonomic (poly)neuropathy: Secondary | ICD-10-CM | POA: Diagnosis not present

## 2022-04-18 DIAGNOSIS — F419 Anxiety disorder, unspecified: Secondary | ICD-10-CM | POA: Diagnosis not present

## 2022-04-18 DIAGNOSIS — F32A Depression, unspecified: Secondary | ICD-10-CM | POA: Diagnosis not present

## 2022-04-18 DIAGNOSIS — Z89611 Acquired absence of right leg above knee: Secondary | ICD-10-CM | POA: Diagnosis not present

## 2022-04-18 DIAGNOSIS — I4891 Unspecified atrial fibrillation: Secondary | ICD-10-CM | POA: Diagnosis not present

## 2022-04-18 DIAGNOSIS — I509 Heart failure, unspecified: Secondary | ICD-10-CM | POA: Diagnosis not present

## 2022-04-18 DIAGNOSIS — I2510 Atherosclerotic heart disease of native coronary artery without angina pectoris: Secondary | ICD-10-CM | POA: Diagnosis not present

## 2022-04-20 DIAGNOSIS — F419 Anxiety disorder, unspecified: Secondary | ICD-10-CM | POA: Diagnosis not present

## 2022-04-20 DIAGNOSIS — F32A Depression, unspecified: Secondary | ICD-10-CM | POA: Diagnosis not present

## 2022-04-20 DIAGNOSIS — I2510 Atherosclerotic heart disease of native coronary artery without angina pectoris: Secondary | ICD-10-CM | POA: Diagnosis not present

## 2022-04-20 DIAGNOSIS — R569 Unspecified convulsions: Secondary | ICD-10-CM | POA: Diagnosis not present

## 2022-04-20 DIAGNOSIS — I10 Essential (primary) hypertension: Secondary | ICD-10-CM | POA: Diagnosis not present

## 2022-04-20 DIAGNOSIS — I509 Heart failure, unspecified: Secondary | ICD-10-CM | POA: Diagnosis not present

## 2022-04-20 DIAGNOSIS — Z4781 Encounter for orthopedic aftercare following surgical amputation: Secondary | ICD-10-CM | POA: Diagnosis not present

## 2022-04-20 DIAGNOSIS — I739 Peripheral vascular disease, unspecified: Secondary | ICD-10-CM | POA: Diagnosis not present

## 2022-04-20 DIAGNOSIS — I4891 Unspecified atrial fibrillation: Secondary | ICD-10-CM | POA: Diagnosis not present

## 2022-04-20 DIAGNOSIS — E1143 Type 2 diabetes mellitus with diabetic autonomic (poly)neuropathy: Secondary | ICD-10-CM | POA: Diagnosis not present

## 2022-04-20 DIAGNOSIS — R64 Cachexia: Secondary | ICD-10-CM | POA: Diagnosis not present

## 2022-04-20 DIAGNOSIS — E1165 Type 2 diabetes mellitus with hyperglycemia: Secondary | ICD-10-CM | POA: Diagnosis not present

## 2022-04-20 DIAGNOSIS — Z89611 Acquired absence of right leg above knee: Secondary | ICD-10-CM | POA: Diagnosis not present

## 2022-04-20 DIAGNOSIS — Z299 Encounter for prophylactic measures, unspecified: Secondary | ICD-10-CM | POA: Diagnosis not present

## 2022-04-21 ENCOUNTER — Other Ambulatory Visit: Payer: Self-pay

## 2022-04-21 DIAGNOSIS — F419 Anxiety disorder, unspecified: Secondary | ICD-10-CM | POA: Diagnosis not present

## 2022-04-21 DIAGNOSIS — E1143 Type 2 diabetes mellitus with diabetic autonomic (poly)neuropathy: Secondary | ICD-10-CM | POA: Diagnosis not present

## 2022-04-21 DIAGNOSIS — I509 Heart failure, unspecified: Secondary | ICD-10-CM | POA: Diagnosis not present

## 2022-04-21 DIAGNOSIS — Z4781 Encounter for orthopedic aftercare following surgical amputation: Secondary | ICD-10-CM | POA: Diagnosis not present

## 2022-04-21 DIAGNOSIS — I4891 Unspecified atrial fibrillation: Secondary | ICD-10-CM | POA: Diagnosis not present

## 2022-04-21 DIAGNOSIS — Z89611 Acquired absence of right leg above knee: Secondary | ICD-10-CM | POA: Diagnosis not present

## 2022-04-21 DIAGNOSIS — I739 Peripheral vascular disease, unspecified: Secondary | ICD-10-CM | POA: Diagnosis not present

## 2022-04-21 DIAGNOSIS — F32A Depression, unspecified: Secondary | ICD-10-CM | POA: Diagnosis not present

## 2022-04-21 DIAGNOSIS — I2510 Atherosclerotic heart disease of native coronary artery without angina pectoris: Secondary | ICD-10-CM | POA: Diagnosis not present

## 2022-04-21 NOTE — Patient Outreach (Addendum)
[facility]) Care Management  04/21/2022  ROHAIL KLEES [birth year] [id number]     Transition of Care Referral  Referral Date: 04/21/2022 Referral Source: [facility] Discharge Report Date of Discharge: 04/17/2022 Facility: [facility] attempt # 1 to patient. Spoke with spouse who reported that patient was resting and requested call be completed with her. She shares that patient left SNF AMA due to not liking the facility and "being told what to do." She reports he is doing better since being back home in his own environment. Spouse shares that they are getting [facility] services. She voices that one day she had five [place] team members out to see pt. She confirms she has all his meds. He went for PCP follow up appt yesterday and meds reviewed then. Spouse denies any RN CM needs or concerns. She did not want ongoing calls as she reports she is getting overwhelmed already by all the [facility] staff coming in and working with patient. She was advised that she could call back for any needs/concerns and voiced understanding and appreciation.     Plan: RN CM will close case.    [name], RN,BSN,CCM [place] Management Telephonic Care Management Coordinator Direct Phone: [phone] Toll Free: [phone] Fax: [phone]

## 2022-04-22 DIAGNOSIS — I509 Heart failure, unspecified: Secondary | ICD-10-CM | POA: Diagnosis not present

## 2022-04-22 DIAGNOSIS — F32A Depression, unspecified: Secondary | ICD-10-CM | POA: Diagnosis not present

## 2022-04-22 DIAGNOSIS — Z89611 Acquired absence of right leg above knee: Secondary | ICD-10-CM | POA: Diagnosis not present

## 2022-04-22 DIAGNOSIS — I4891 Unspecified atrial fibrillation: Secondary | ICD-10-CM | POA: Diagnosis not present

## 2022-04-22 DIAGNOSIS — F419 Anxiety disorder, unspecified: Secondary | ICD-10-CM | POA: Diagnosis not present

## 2022-04-22 DIAGNOSIS — Z4781 Encounter for orthopedic aftercare following surgical amputation: Secondary | ICD-10-CM | POA: Diagnosis not present

## 2022-04-22 DIAGNOSIS — E1143 Type 2 diabetes mellitus with diabetic autonomic (poly)neuropathy: Secondary | ICD-10-CM | POA: Diagnosis not present

## 2022-04-22 DIAGNOSIS — I739 Peripheral vascular disease, unspecified: Secondary | ICD-10-CM | POA: Diagnosis not present

## 2022-04-22 DIAGNOSIS — I2510 Atherosclerotic heart disease of native coronary artery without angina pectoris: Secondary | ICD-10-CM | POA: Diagnosis not present

## 2022-04-24 DIAGNOSIS — I5089 Other heart failure: Secondary | ICD-10-CM | POA: Diagnosis not present

## 2022-04-24 DIAGNOSIS — R079 Chest pain, unspecified: Secondary | ICD-10-CM | POA: Diagnosis not present

## 2022-04-24 DIAGNOSIS — I10 Essential (primary) hypertension: Secondary | ICD-10-CM | POA: Diagnosis not present

## 2022-04-24 DIAGNOSIS — E11649 Type 2 diabetes mellitus with hypoglycemia without coma: Secondary | ICD-10-CM | POA: Diagnosis not present

## 2022-04-24 DIAGNOSIS — D509 Iron deficiency anemia, unspecified: Secondary | ICD-10-CM | POA: Diagnosis not present

## 2022-04-24 DIAGNOSIS — R069 Unspecified abnormalities of breathing: Secondary | ICD-10-CM | POA: Diagnosis not present

## 2022-04-24 DIAGNOSIS — R0902 Hypoxemia: Secondary | ICD-10-CM | POA: Diagnosis not present

## 2022-04-24 DIAGNOSIS — E119 Type 2 diabetes mellitus without complications: Secondary | ICD-10-CM | POA: Diagnosis not present

## 2022-04-24 DIAGNOSIS — E1122 Type 2 diabetes mellitus with diabetic chronic kidney disease: Secondary | ICD-10-CM | POA: Diagnosis not present

## 2022-04-24 DIAGNOSIS — I130 Hypertensive heart and chronic kidney disease with heart failure and stage 1 through stage 4 chronic kidney disease, or unspecified chronic kidney disease: Secondary | ICD-10-CM | POA: Diagnosis not present

## 2022-04-24 DIAGNOSIS — N170 Acute kidney failure with tubular necrosis: Secondary | ICD-10-CM | POA: Diagnosis not present

## 2022-04-24 DIAGNOSIS — I5043 Acute on chronic combined systolic (congestive) and diastolic (congestive) heart failure: Secondary | ICD-10-CM | POA: Diagnosis not present

## 2022-04-24 DIAGNOSIS — G9341 Metabolic encephalopathy: Secondary | ICD-10-CM | POA: Diagnosis not present

## 2022-04-24 DIAGNOSIS — J189 Pneumonia, unspecified organism: Secondary | ICD-10-CM | POA: Diagnosis not present

## 2022-04-24 DIAGNOSIS — J90 Pleural effusion, not elsewhere classified: Secondary | ICD-10-CM | POA: Diagnosis not present

## 2022-04-24 DIAGNOSIS — N179 Acute kidney failure, unspecified: Secondary | ICD-10-CM | POA: Diagnosis not present

## 2022-04-24 DIAGNOSIS — D649 Anemia, unspecified: Secondary | ICD-10-CM | POA: Diagnosis not present

## 2022-04-24 DIAGNOSIS — I5032 Chronic diastolic (congestive) heart failure: Secondary | ICD-10-CM | POA: Diagnosis not present

## 2022-04-24 DIAGNOSIS — L89153 Pressure ulcer of sacral region, stage 3: Secondary | ICD-10-CM | POA: Diagnosis not present

## 2022-04-24 DIAGNOSIS — R0789 Other chest pain: Secondary | ICD-10-CM | POA: Diagnosis not present

## 2022-04-24 DIAGNOSIS — R0689 Other abnormalities of breathing: Secondary | ICD-10-CM | POA: Diagnosis not present

## 2022-04-24 DIAGNOSIS — J9601 Acute respiratory failure with hypoxia: Secondary | ICD-10-CM | POA: Diagnosis not present

## 2022-04-24 DIAGNOSIS — N1831 Chronic kidney disease, stage 3a: Secondary | ICD-10-CM | POA: Diagnosis not present

## 2022-04-25 DIAGNOSIS — J189 Pneumonia, unspecified organism: Secondary | ICD-10-CM | POA: Diagnosis not present

## 2022-04-25 DIAGNOSIS — N179 Acute kidney failure, unspecified: Secondary | ICD-10-CM | POA: Diagnosis not present

## 2022-04-26 DIAGNOSIS — L89153 Pressure ulcer of sacral region, stage 3: Secondary | ICD-10-CM | POA: Diagnosis not present

## 2022-04-26 DIAGNOSIS — J189 Pneumonia, unspecified organism: Secondary | ICD-10-CM | POA: Diagnosis not present

## 2022-04-30 DIAGNOSIS — I130 Hypertensive heart and chronic kidney disease with heart failure and stage 1 through stage 4 chronic kidney disease, or unspecified chronic kidney disease: Secondary | ICD-10-CM | POA: Diagnosis not present

## 2022-04-30 DIAGNOSIS — N1831 Chronic kidney disease, stage 3a: Secondary | ICD-10-CM | POA: Diagnosis not present

## 2022-04-30 DIAGNOSIS — L89153 Pressure ulcer of sacral region, stage 3: Secondary | ICD-10-CM | POA: Diagnosis not present

## 2022-04-30 DIAGNOSIS — G9341 Metabolic encephalopathy: Secondary | ICD-10-CM | POA: Diagnosis not present

## 2022-04-30 DIAGNOSIS — E1151 Type 2 diabetes mellitus with diabetic peripheral angiopathy without gangrene: Secondary | ICD-10-CM | POA: Diagnosis not present

## 2022-04-30 DIAGNOSIS — J189 Pneumonia, unspecified organism: Secondary | ICD-10-CM | POA: Diagnosis not present

## 2022-04-30 DIAGNOSIS — E11649 Type 2 diabetes mellitus with hypoglycemia without coma: Secondary | ICD-10-CM | POA: Diagnosis not present

## 2022-04-30 DIAGNOSIS — I509 Heart failure, unspecified: Secondary | ICD-10-CM | POA: Diagnosis not present

## 2022-04-30 DIAGNOSIS — N179 Acute kidney failure, unspecified: Secondary | ICD-10-CM | POA: Diagnosis not present

## 2022-05-02 DIAGNOSIS — I509 Heart failure, unspecified: Secondary | ICD-10-CM | POA: Diagnosis not present

## 2022-05-02 DIAGNOSIS — E1151 Type 2 diabetes mellitus with diabetic peripheral angiopathy without gangrene: Secondary | ICD-10-CM | POA: Diagnosis not present

## 2022-05-02 DIAGNOSIS — E11649 Type 2 diabetes mellitus with hypoglycemia without coma: Secondary | ICD-10-CM | POA: Diagnosis not present

## 2022-05-02 DIAGNOSIS — N179 Acute kidney failure, unspecified: Secondary | ICD-10-CM | POA: Diagnosis not present

## 2022-05-02 DIAGNOSIS — G9341 Metabolic encephalopathy: Secondary | ICD-10-CM | POA: Diagnosis not present

## 2022-05-02 DIAGNOSIS — I130 Hypertensive heart and chronic kidney disease with heart failure and stage 1 through stage 4 chronic kidney disease, or unspecified chronic kidney disease: Secondary | ICD-10-CM | POA: Diagnosis not present

## 2022-05-02 DIAGNOSIS — N1831 Chronic kidney disease, stage 3a: Secondary | ICD-10-CM | POA: Diagnosis not present

## 2022-05-02 DIAGNOSIS — J189 Pneumonia, unspecified organism: Secondary | ICD-10-CM | POA: Diagnosis not present

## 2022-05-02 DIAGNOSIS — L89153 Pressure ulcer of sacral region, stage 3: Secondary | ICD-10-CM | POA: Diagnosis not present

## 2022-05-03 DIAGNOSIS — L89153 Pressure ulcer of sacral region, stage 3: Secondary | ICD-10-CM | POA: Diagnosis not present

## 2022-05-03 DIAGNOSIS — I509 Heart failure, unspecified: Secondary | ICD-10-CM | POA: Diagnosis not present

## 2022-05-03 DIAGNOSIS — N179 Acute kidney failure, unspecified: Secondary | ICD-10-CM | POA: Diagnosis not present

## 2022-05-03 DIAGNOSIS — G9341 Metabolic encephalopathy: Secondary | ICD-10-CM | POA: Diagnosis not present

## 2022-05-03 DIAGNOSIS — N1831 Chronic kidney disease, stage 3a: Secondary | ICD-10-CM | POA: Diagnosis not present

## 2022-05-03 DIAGNOSIS — E1151 Type 2 diabetes mellitus with diabetic peripheral angiopathy without gangrene: Secondary | ICD-10-CM | POA: Diagnosis not present

## 2022-05-03 DIAGNOSIS — I130 Hypertensive heart and chronic kidney disease with heart failure and stage 1 through stage 4 chronic kidney disease, or unspecified chronic kidney disease: Secondary | ICD-10-CM | POA: Diagnosis not present

## 2022-05-03 DIAGNOSIS — J189 Pneumonia, unspecified organism: Secondary | ICD-10-CM | POA: Diagnosis not present

## 2022-05-03 DIAGNOSIS — E11649 Type 2 diabetes mellitus with hypoglycemia without coma: Secondary | ICD-10-CM | POA: Diagnosis not present

## 2022-05-04 DIAGNOSIS — N1831 Chronic kidney disease, stage 3a: Secondary | ICD-10-CM | POA: Diagnosis not present

## 2022-05-04 DIAGNOSIS — E1151 Type 2 diabetes mellitus with diabetic peripheral angiopathy without gangrene: Secondary | ICD-10-CM | POA: Diagnosis not present

## 2022-05-04 DIAGNOSIS — I509 Heart failure, unspecified: Secondary | ICD-10-CM | POA: Diagnosis not present

## 2022-05-04 DIAGNOSIS — E11649 Type 2 diabetes mellitus with hypoglycemia without coma: Secondary | ICD-10-CM | POA: Diagnosis not present

## 2022-05-04 DIAGNOSIS — J189 Pneumonia, unspecified organism: Secondary | ICD-10-CM | POA: Diagnosis not present

## 2022-05-04 DIAGNOSIS — N179 Acute kidney failure, unspecified: Secondary | ICD-10-CM | POA: Diagnosis not present

## 2022-05-04 DIAGNOSIS — L89153 Pressure ulcer of sacral region, stage 3: Secondary | ICD-10-CM | POA: Diagnosis not present

## 2022-05-04 DIAGNOSIS — G9341 Metabolic encephalopathy: Secondary | ICD-10-CM | POA: Diagnosis not present

## 2022-05-04 DIAGNOSIS — I130 Hypertensive heart and chronic kidney disease with heart failure and stage 1 through stage 4 chronic kidney disease, or unspecified chronic kidney disease: Secondary | ICD-10-CM | POA: Diagnosis not present

## 2022-05-05 DIAGNOSIS — L89153 Pressure ulcer of sacral region, stage 3: Secondary | ICD-10-CM | POA: Diagnosis not present

## 2022-05-05 DIAGNOSIS — I509 Heart failure, unspecified: Secondary | ICD-10-CM | POA: Diagnosis not present

## 2022-05-05 DIAGNOSIS — N1831 Chronic kidney disease, stage 3a: Secondary | ICD-10-CM | POA: Diagnosis not present

## 2022-05-05 DIAGNOSIS — J189 Pneumonia, unspecified organism: Secondary | ICD-10-CM | POA: Diagnosis not present

## 2022-05-05 DIAGNOSIS — N179 Acute kidney failure, unspecified: Secondary | ICD-10-CM | POA: Diagnosis not present

## 2022-05-05 DIAGNOSIS — E11649 Type 2 diabetes mellitus with hypoglycemia without coma: Secondary | ICD-10-CM | POA: Diagnosis not present

## 2022-05-05 DIAGNOSIS — I130 Hypertensive heart and chronic kidney disease with heart failure and stage 1 through stage 4 chronic kidney disease, or unspecified chronic kidney disease: Secondary | ICD-10-CM | POA: Diagnosis not present

## 2022-05-05 DIAGNOSIS — E1151 Type 2 diabetes mellitus with diabetic peripheral angiopathy without gangrene: Secondary | ICD-10-CM | POA: Diagnosis not present

## 2022-05-05 DIAGNOSIS — G9341 Metabolic encephalopathy: Secondary | ICD-10-CM | POA: Diagnosis not present

## 2022-05-06 DIAGNOSIS — L89153 Pressure ulcer of sacral region, stage 3: Secondary | ICD-10-CM | POA: Diagnosis not present

## 2022-05-06 DIAGNOSIS — E11649 Type 2 diabetes mellitus with hypoglycemia without coma: Secondary | ICD-10-CM | POA: Diagnosis not present

## 2022-05-06 DIAGNOSIS — I509 Heart failure, unspecified: Secondary | ICD-10-CM | POA: Diagnosis not present

## 2022-05-06 DIAGNOSIS — N179 Acute kidney failure, unspecified: Secondary | ICD-10-CM | POA: Diagnosis not present

## 2022-05-06 DIAGNOSIS — G9341 Metabolic encephalopathy: Secondary | ICD-10-CM | POA: Diagnosis not present

## 2022-05-06 DIAGNOSIS — J189 Pneumonia, unspecified organism: Secondary | ICD-10-CM | POA: Diagnosis not present

## 2022-05-06 DIAGNOSIS — N1831 Chronic kidney disease, stage 3a: Secondary | ICD-10-CM | POA: Diagnosis not present

## 2022-05-06 DIAGNOSIS — I130 Hypertensive heart and chronic kidney disease with heart failure and stage 1 through stage 4 chronic kidney disease, or unspecified chronic kidney disease: Secondary | ICD-10-CM | POA: Diagnosis not present

## 2022-05-06 DIAGNOSIS — E1151 Type 2 diabetes mellitus with diabetic peripheral angiopathy without gangrene: Secondary | ICD-10-CM | POA: Diagnosis not present

## 2022-05-09 DIAGNOSIS — E1151 Type 2 diabetes mellitus with diabetic peripheral angiopathy without gangrene: Secondary | ICD-10-CM | POA: Diagnosis not present

## 2022-05-09 DIAGNOSIS — I509 Heart failure, unspecified: Secondary | ICD-10-CM | POA: Diagnosis not present

## 2022-05-09 DIAGNOSIS — L89153 Pressure ulcer of sacral region, stage 3: Secondary | ICD-10-CM | POA: Diagnosis not present

## 2022-05-09 DIAGNOSIS — I130 Hypertensive heart and chronic kidney disease with heart failure and stage 1 through stage 4 chronic kidney disease, or unspecified chronic kidney disease: Secondary | ICD-10-CM | POA: Diagnosis not present

## 2022-05-09 DIAGNOSIS — E11649 Type 2 diabetes mellitus with hypoglycemia without coma: Secondary | ICD-10-CM | POA: Diagnosis not present

## 2022-05-09 DIAGNOSIS — N1831 Chronic kidney disease, stage 3a: Secondary | ICD-10-CM | POA: Diagnosis not present

## 2022-05-09 DIAGNOSIS — G9341 Metabolic encephalopathy: Secondary | ICD-10-CM | POA: Diagnosis not present

## 2022-05-09 DIAGNOSIS — J189 Pneumonia, unspecified organism: Secondary | ICD-10-CM | POA: Diagnosis not present

## 2022-05-09 DIAGNOSIS — N179 Acute kidney failure, unspecified: Secondary | ICD-10-CM | POA: Diagnosis not present

## 2022-05-10 DIAGNOSIS — N179 Acute kidney failure, unspecified: Secondary | ICD-10-CM | POA: Diagnosis not present

## 2022-05-10 DIAGNOSIS — L89153 Pressure ulcer of sacral region, stage 3: Secondary | ICD-10-CM | POA: Diagnosis not present

## 2022-05-10 DIAGNOSIS — J189 Pneumonia, unspecified organism: Secondary | ICD-10-CM | POA: Diagnosis not present

## 2022-05-10 DIAGNOSIS — E1151 Type 2 diabetes mellitus with diabetic peripheral angiopathy without gangrene: Secondary | ICD-10-CM | POA: Diagnosis not present

## 2022-05-10 DIAGNOSIS — G9341 Metabolic encephalopathy: Secondary | ICD-10-CM | POA: Diagnosis not present

## 2022-05-10 DIAGNOSIS — N1831 Chronic kidney disease, stage 3a: Secondary | ICD-10-CM | POA: Diagnosis not present

## 2022-05-10 DIAGNOSIS — I509 Heart failure, unspecified: Secondary | ICD-10-CM | POA: Diagnosis not present

## 2022-05-10 DIAGNOSIS — I130 Hypertensive heart and chronic kidney disease with heart failure and stage 1 through stage 4 chronic kidney disease, or unspecified chronic kidney disease: Secondary | ICD-10-CM | POA: Diagnosis not present

## 2022-05-10 DIAGNOSIS — E11649 Type 2 diabetes mellitus with hypoglycemia without coma: Secondary | ICD-10-CM | POA: Diagnosis not present

## 2022-05-11 DIAGNOSIS — J189 Pneumonia, unspecified organism: Secondary | ICD-10-CM | POA: Diagnosis not present

## 2022-05-11 DIAGNOSIS — L89153 Pressure ulcer of sacral region, stage 3: Secondary | ICD-10-CM | POA: Diagnosis not present

## 2022-05-11 DIAGNOSIS — N179 Acute kidney failure, unspecified: Secondary | ICD-10-CM | POA: Diagnosis not present

## 2022-05-11 DIAGNOSIS — G9341 Metabolic encephalopathy: Secondary | ICD-10-CM | POA: Diagnosis not present

## 2022-05-11 DIAGNOSIS — I130 Hypertensive heart and chronic kidney disease with heart failure and stage 1 through stage 4 chronic kidney disease, or unspecified chronic kidney disease: Secondary | ICD-10-CM | POA: Diagnosis not present

## 2022-05-11 DIAGNOSIS — N1831 Chronic kidney disease, stage 3a: Secondary | ICD-10-CM | POA: Diagnosis not present

## 2022-05-11 DIAGNOSIS — E11649 Type 2 diabetes mellitus with hypoglycemia without coma: Secondary | ICD-10-CM | POA: Diagnosis not present

## 2022-05-11 DIAGNOSIS — E1151 Type 2 diabetes mellitus with diabetic peripheral angiopathy without gangrene: Secondary | ICD-10-CM | POA: Diagnosis not present

## 2022-05-11 DIAGNOSIS — I509 Heart failure, unspecified: Secondary | ICD-10-CM | POA: Diagnosis not present

## 2022-05-12 DIAGNOSIS — I10 Essential (primary) hypertension: Secondary | ICD-10-CM | POA: Diagnosis not present

## 2022-05-12 DIAGNOSIS — N179 Acute kidney failure, unspecified: Secondary | ICD-10-CM | POA: Diagnosis not present

## 2022-05-12 DIAGNOSIS — E11649 Type 2 diabetes mellitus with hypoglycemia without coma: Secondary | ICD-10-CM | POA: Diagnosis not present

## 2022-05-12 DIAGNOSIS — J189 Pneumonia, unspecified organism: Secondary | ICD-10-CM | POA: Diagnosis not present

## 2022-05-12 DIAGNOSIS — Z1339 Encounter for screening examination for other mental health and behavioral disorders: Secondary | ICD-10-CM | POA: Diagnosis not present

## 2022-05-12 DIAGNOSIS — Z299 Encounter for prophylactic measures, unspecified: Secondary | ICD-10-CM | POA: Diagnosis not present

## 2022-05-12 DIAGNOSIS — I509 Heart failure, unspecified: Secondary | ICD-10-CM | POA: Diagnosis not present

## 2022-05-12 DIAGNOSIS — Z6824 Body mass index (BMI) 24.0-24.9, adult: Secondary | ICD-10-CM | POA: Diagnosis not present

## 2022-05-12 DIAGNOSIS — L89153 Pressure ulcer of sacral region, stage 3: Secondary | ICD-10-CM | POA: Diagnosis not present

## 2022-05-12 DIAGNOSIS — Z0000 Encounter for general adult medical examination without abnormal findings: Secondary | ICD-10-CM | POA: Diagnosis not present

## 2022-05-12 DIAGNOSIS — I130 Hypertensive heart and chronic kidney disease with heart failure and stage 1 through stage 4 chronic kidney disease, or unspecified chronic kidney disease: Secondary | ICD-10-CM | POA: Diagnosis not present

## 2022-05-12 DIAGNOSIS — N1831 Chronic kidney disease, stage 3a: Secondary | ICD-10-CM | POA: Diagnosis not present

## 2022-05-12 DIAGNOSIS — Z1331 Encounter for screening for depression: Secondary | ICD-10-CM | POA: Diagnosis not present

## 2022-05-12 DIAGNOSIS — G9341 Metabolic encephalopathy: Secondary | ICD-10-CM | POA: Diagnosis not present

## 2022-05-12 DIAGNOSIS — Z7189 Other specified counseling: Secondary | ICD-10-CM | POA: Diagnosis not present

## 2022-05-12 DIAGNOSIS — E1151 Type 2 diabetes mellitus with diabetic peripheral angiopathy without gangrene: Secondary | ICD-10-CM | POA: Diagnosis not present

## 2022-05-13 DIAGNOSIS — J189 Pneumonia, unspecified organism: Secondary | ICD-10-CM | POA: Diagnosis not present

## 2022-05-13 DIAGNOSIS — I509 Heart failure, unspecified: Secondary | ICD-10-CM | POA: Diagnosis not present

## 2022-05-13 DIAGNOSIS — E1151 Type 2 diabetes mellitus with diabetic peripheral angiopathy without gangrene: Secondary | ICD-10-CM | POA: Diagnosis not present

## 2022-05-13 DIAGNOSIS — N1831 Chronic kidney disease, stage 3a: Secondary | ICD-10-CM | POA: Diagnosis not present

## 2022-05-13 DIAGNOSIS — L89153 Pressure ulcer of sacral region, stage 3: Secondary | ICD-10-CM | POA: Diagnosis not present

## 2022-05-13 DIAGNOSIS — N179 Acute kidney failure, unspecified: Secondary | ICD-10-CM | POA: Diagnosis not present

## 2022-05-13 DIAGNOSIS — I130 Hypertensive heart and chronic kidney disease with heart failure and stage 1 through stage 4 chronic kidney disease, or unspecified chronic kidney disease: Secondary | ICD-10-CM | POA: Diagnosis not present

## 2022-05-13 DIAGNOSIS — E11649 Type 2 diabetes mellitus with hypoglycemia without coma: Secondary | ICD-10-CM | POA: Diagnosis not present

## 2022-05-13 DIAGNOSIS — G9341 Metabolic encephalopathy: Secondary | ICD-10-CM | POA: Diagnosis not present

## 2022-05-14 DIAGNOSIS — Z89611 Acquired absence of right leg above knee: Secondary | ICD-10-CM | POA: Diagnosis not present

## 2022-05-14 DIAGNOSIS — J9601 Acute respiratory failure with hypoxia: Secondary | ICD-10-CM | POA: Diagnosis not present

## 2022-05-14 DIAGNOSIS — I509 Heart failure, unspecified: Secondary | ICD-10-CM | POA: Diagnosis not present

## 2022-05-14 DIAGNOSIS — E162 Hypoglycemia, unspecified: Secondary | ICD-10-CM | POA: Diagnosis not present

## 2022-05-14 DIAGNOSIS — F05 Delirium due to known physiological condition: Secondary | ICD-10-CM | POA: Diagnosis not present

## 2022-05-14 DIAGNOSIS — R0902 Hypoxemia: Secondary | ICD-10-CM | POA: Diagnosis not present

## 2022-05-14 DIAGNOSIS — R001 Bradycardia, unspecified: Secondary | ICD-10-CM | POA: Diagnosis not present

## 2022-05-14 DIAGNOSIS — L97921 Non-pressure chronic ulcer of unspecified part of left lower leg limited to breakdown of skin: Secondary | ICD-10-CM | POA: Diagnosis not present

## 2022-05-14 DIAGNOSIS — J9690 Respiratory failure, unspecified, unspecified whether with hypoxia or hypercapnia: Secondary | ICD-10-CM | POA: Diagnosis not present

## 2022-05-14 DIAGNOSIS — Z01810 Encounter for preprocedural cardiovascular examination: Secondary | ICD-10-CM | POA: Diagnosis not present

## 2022-05-14 DIAGNOSIS — E08649 Diabetes mellitus due to underlying condition with hypoglycemia without coma: Secondary | ICD-10-CM | POA: Diagnosis not present

## 2022-05-14 DIAGNOSIS — L89150 Pressure ulcer of sacral region, unstageable: Secondary | ICD-10-CM | POA: Diagnosis not present

## 2022-05-14 DIAGNOSIS — J188 Other pneumonia, unspecified organism: Secondary | ICD-10-CM | POA: Diagnosis not present

## 2022-05-14 DIAGNOSIS — D62 Acute posthemorrhagic anemia: Secondary | ICD-10-CM | POA: Diagnosis not present

## 2022-05-14 DIAGNOSIS — I4819 Other persistent atrial fibrillation: Secondary | ICD-10-CM | POA: Diagnosis not present

## 2022-05-14 DIAGNOSIS — J189 Pneumonia, unspecified organism: Secondary | ICD-10-CM | POA: Diagnosis not present

## 2022-05-14 DIAGNOSIS — E118 Type 2 diabetes mellitus with unspecified complications: Secondary | ICD-10-CM | POA: Diagnosis not present

## 2022-05-14 DIAGNOSIS — J811 Chronic pulmonary edema: Secondary | ICD-10-CM | POA: Diagnosis not present

## 2022-05-14 DIAGNOSIS — R58 Hemorrhage, not elsewhere classified: Secondary | ICD-10-CM | POA: Diagnosis not present

## 2022-05-14 DIAGNOSIS — J90 Pleural effusion, not elsewhere classified: Secondary | ICD-10-CM | POA: Diagnosis not present

## 2022-05-14 DIAGNOSIS — I739 Peripheral vascular disease, unspecified: Secondary | ICD-10-CM | POA: Diagnosis not present

## 2022-05-14 DIAGNOSIS — R042 Hemoptysis: Secondary | ICD-10-CM | POA: Diagnosis not present

## 2022-05-14 DIAGNOSIS — I5032 Chronic diastolic (congestive) heart failure: Secondary | ICD-10-CM | POA: Diagnosis not present

## 2022-05-14 DIAGNOSIS — E161 Other hypoglycemia: Secondary | ICD-10-CM | POA: Diagnosis not present

## 2022-05-14 DIAGNOSIS — R410 Disorientation, unspecified: Secondary | ICD-10-CM | POA: Diagnosis not present

## 2022-05-14 DIAGNOSIS — I130 Hypertensive heart and chronic kidney disease with heart failure and stage 1 through stage 4 chronic kidney disease, or unspecified chronic kidney disease: Secondary | ICD-10-CM | POA: Diagnosis not present

## 2022-05-14 DIAGNOSIS — F03911 Unspecified dementia, unspecified severity, with agitation: Secondary | ICD-10-CM | POA: Diagnosis not present

## 2022-05-15 DIAGNOSIS — E162 Hypoglycemia, unspecified: Secondary | ICD-10-CM | POA: Diagnosis not present

## 2022-05-15 DIAGNOSIS — J189 Pneumonia, unspecified organism: Secondary | ICD-10-CM | POA: Diagnosis not present

## 2022-05-16 DIAGNOSIS — J189 Pneumonia, unspecified organism: Secondary | ICD-10-CM | POA: Diagnosis not present

## 2022-05-17 DIAGNOSIS — L97921 Non-pressure chronic ulcer of unspecified part of left lower leg limited to breakdown of skin: Secondary | ICD-10-CM | POA: Diagnosis not present

## 2022-05-17 DIAGNOSIS — J189 Pneumonia, unspecified organism: Secondary | ICD-10-CM | POA: Diagnosis not present

## 2022-05-17 DIAGNOSIS — L89150 Pressure ulcer of sacral region, unstageable: Secondary | ICD-10-CM | POA: Diagnosis not present

## 2022-05-17 DIAGNOSIS — E118 Type 2 diabetes mellitus with unspecified complications: Secondary | ICD-10-CM | POA: Diagnosis not present

## 2022-05-18 DIAGNOSIS — J189 Pneumonia, unspecified organism: Secondary | ICD-10-CM | POA: Diagnosis not present

## 2022-05-19 DIAGNOSIS — J189 Pneumonia, unspecified organism: Secondary | ICD-10-CM | POA: Diagnosis not present

## 2022-05-20 DIAGNOSIS — J189 Pneumonia, unspecified organism: Secondary | ICD-10-CM | POA: Diagnosis not present

## 2022-05-20 DIAGNOSIS — J9690 Respiratory failure, unspecified, unspecified whether with hypoxia or hypercapnia: Secondary | ICD-10-CM | POA: Diagnosis not present

## 2022-05-20 DIAGNOSIS — Z01810 Encounter for preprocedural cardiovascular examination: Secondary | ICD-10-CM | POA: Diagnosis not present

## 2022-05-20 DIAGNOSIS — J90 Pleural effusion, not elsewhere classified: Secondary | ICD-10-CM | POA: Diagnosis not present

## 2022-05-21 DIAGNOSIS — J189 Pneumonia, unspecified organism: Secondary | ICD-10-CM | POA: Diagnosis not present

## 2022-05-21 DIAGNOSIS — J90 Pleural effusion, not elsewhere classified: Secondary | ICD-10-CM | POA: Diagnosis not present

## 2022-05-22 DIAGNOSIS — J189 Pneumonia, unspecified organism: Secondary | ICD-10-CM | POA: Diagnosis not present

## 2022-05-22 DIAGNOSIS — J811 Chronic pulmonary edema: Secondary | ICD-10-CM | POA: Diagnosis not present

## 2022-05-23 DIAGNOSIS — J189 Pneumonia, unspecified organism: Secondary | ICD-10-CM | POA: Diagnosis not present

## 2022-05-23 DIAGNOSIS — I509 Heart failure, unspecified: Secondary | ICD-10-CM | POA: Diagnosis not present

## 2022-05-30 ENCOUNTER — Telehealth: Payer: Self-pay | Admitting: *Deleted

## 2022-06-02 ENCOUNTER — Telehealth: Payer: Self-pay | Admitting: *Deleted

## 2022-06-02 NOTE — Patient Outreach (Signed)
  Care Coordination   06/02/2022 Name: Dennis [name] MRN: [id number] DOB: April 27, 1946   Care Coordination Outreach Attempts:  A second unsuccessful outreach was attempted today to offer the patient with information about available care coordination services as a benefit of their health plan.     Follow Up Plan:  Additional outreach attempts will be made to offer the patient care coordination information and services.   Encounter Outcome:  No Answer  Care Coordination Interventions Activated:  No   Care Coordination Interventions:  No, not indicated    Valente David, RN, MSN, [facility] Care Management Care Management Coordinator [phone]

## 2022-06-07 ENCOUNTER — Telehealth: Payer: Self-pay | Admitting: *Deleted

## 2022-06-07 NOTE — Patient Outreach (Signed)
  Care Coordination   06/07/2022 Name: Dennis [name] MRN: [id number] DOB: [birth year]   Care Coordination Outreach Attempts:  A third unsuccessful outreach was attempted today to offer the patient with information about available care coordination services as a benefit of their health plan.   Follow Up Plan:  No further outreach attempts will be made at this time. We have been unable to contact the patient to offer or enroll patient in care coordination services  Encounter Outcome:  No Answer  Care Coordination Interventions Activated:  No   Care Coordination Interventions:  No, not indicated    Valente David, RN, MSN, [facility] Care Management Care Management Coordinator [phone]

## 2022-06-19 DEATH — deceased

## 2022-09-06 ENCOUNTER — Ambulatory Visit: Payer: Medicare HMO | Admitting: Cardiology
